# Patient Record
Sex: Male | Born: 1945 | Race: Black or African American | Hispanic: No | Marital: Married | Smoking: Former smoker
Health system: Southern US, Community
[De-identification: ages and names within clinical notes are randomized; demographics above are authoritative.]

## PROBLEM LIST (undated history)

## (undated) DIAGNOSIS — D649 Anemia, unspecified: Secondary | ICD-10-CM

## (undated) DIAGNOSIS — C61 Malignant neoplasm of prostate: Secondary | ICD-10-CM

## (undated) DIAGNOSIS — Z800 Family history of malignant neoplasm of digestive organs: Secondary | ICD-10-CM

## (undated) DIAGNOSIS — R3915 Urgency of urination: Secondary | ICD-10-CM

## (undated) DIAGNOSIS — R351 Nocturia: Secondary | ICD-10-CM

## (undated) DIAGNOSIS — G250 Essential tremor: Secondary | ICD-10-CM

## (undated) DIAGNOSIS — Z720 Tobacco use: Secondary | ICD-10-CM

## (undated) DIAGNOSIS — Z973 Presence of spectacles and contact lenses: Secondary | ICD-10-CM

## (undated) DIAGNOSIS — I639 Cerebral infarction, unspecified: Secondary | ICD-10-CM

## (undated) DIAGNOSIS — K219 Gastro-esophageal reflux disease without esophagitis: Secondary | ICD-10-CM

## (undated) DIAGNOSIS — I3139 Other pericardial effusion (noninflammatory): Secondary | ICD-10-CM

## (undated) DIAGNOSIS — Z860100 Personal history of colon polyps, unspecified: Secondary | ICD-10-CM

## (undated) DIAGNOSIS — C7B8 Other secondary neuroendocrine tumors: Secondary | ICD-10-CM

## (undated) DIAGNOSIS — E7800 Pure hypercholesterolemia, unspecified: Secondary | ICD-10-CM

## (undated) DIAGNOSIS — Z803 Family history of malignant neoplasm of breast: Secondary | ICD-10-CM

## (undated) DIAGNOSIS — I10 Essential (primary) hypertension: Secondary | ICD-10-CM

## (undated) DIAGNOSIS — Z8042 Family history of malignant neoplasm of prostate: Secondary | ICD-10-CM

## (undated) DIAGNOSIS — N529 Male erectile dysfunction, unspecified: Secondary | ICD-10-CM

## (undated) DIAGNOSIS — I300 Acute nonspecific idiopathic pericarditis: Secondary | ICD-10-CM

## (undated) DIAGNOSIS — I319 Disease of pericardium, unspecified: Secondary | ICD-10-CM

## (undated) DIAGNOSIS — I313 Pericardial effusion (noninflammatory): Secondary | ICD-10-CM

## (undated) HISTORY — DX: Family history of malignant neoplasm of prostate: Z80.42

## (undated) HISTORY — DX: Other secondary neuroendocrine tumors: C7B.8

## (undated) HISTORY — DX: Anemia, unspecified: D64.9

## (undated) HISTORY — DX: Cerebral infarction, unspecified: I63.9

## (undated) HISTORY — DX: Tobacco use: Z72.0

## (undated) HISTORY — PX: PROSTATE BIOPSY: SHX241

## (undated) HISTORY — DX: Pericardial effusion (noninflammatory): I31.3

## (undated) HISTORY — DX: Family history of malignant neoplasm of breast: Z80.3

## (undated) HISTORY — DX: Other pericardial effusion (noninflammatory): I31.39

## (undated) HISTORY — PX: COLONOSCOPY: SHX174

## (undated) HISTORY — DX: Disease of pericardium, unspecified: I31.9

## (undated) HISTORY — DX: Family history of malignant neoplasm of digestive organs: Z80.0

---

## 1999-08-22 ENCOUNTER — Ambulatory Visit (HOSPITAL_COMMUNITY): Admission: RE | Admit: 1999-08-22 | Discharge: 1999-08-22 | Payer: Self-pay | Admitting: Internal Medicine

## 2000-06-22 ENCOUNTER — Encounter: Admission: RE | Admit: 2000-06-22 | Discharge: 2000-06-22 | Payer: Self-pay | Admitting: Internal Medicine

## 2000-06-22 ENCOUNTER — Encounter: Payer: Self-pay | Admitting: Internal Medicine

## 2005-05-28 ENCOUNTER — Encounter: Admission: RE | Admit: 2005-05-28 | Discharge: 2005-05-28 | Payer: Self-pay | Admitting: Internal Medicine

## 2005-05-28 IMAGING — CR DG CERVICAL SPINE COMPLETE 4+V
5 series · 5 of 5 positions shown · non-contrast
Comparison: None.

CLINICAL DATA: Neck and left arm pain for 3-4 months.  No history of injury.
 FIVE VIEW CERVICAL SPINE:

[w c-spine lat]
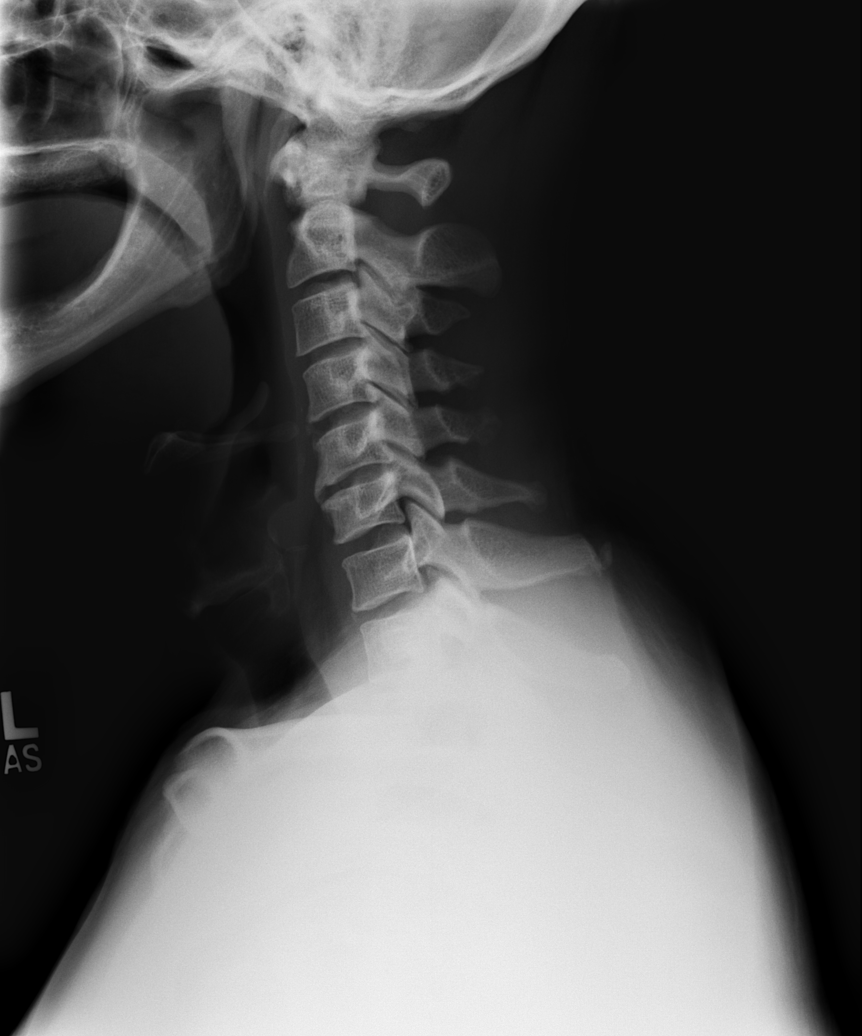

[w c-spine oblique (1 of 2)]
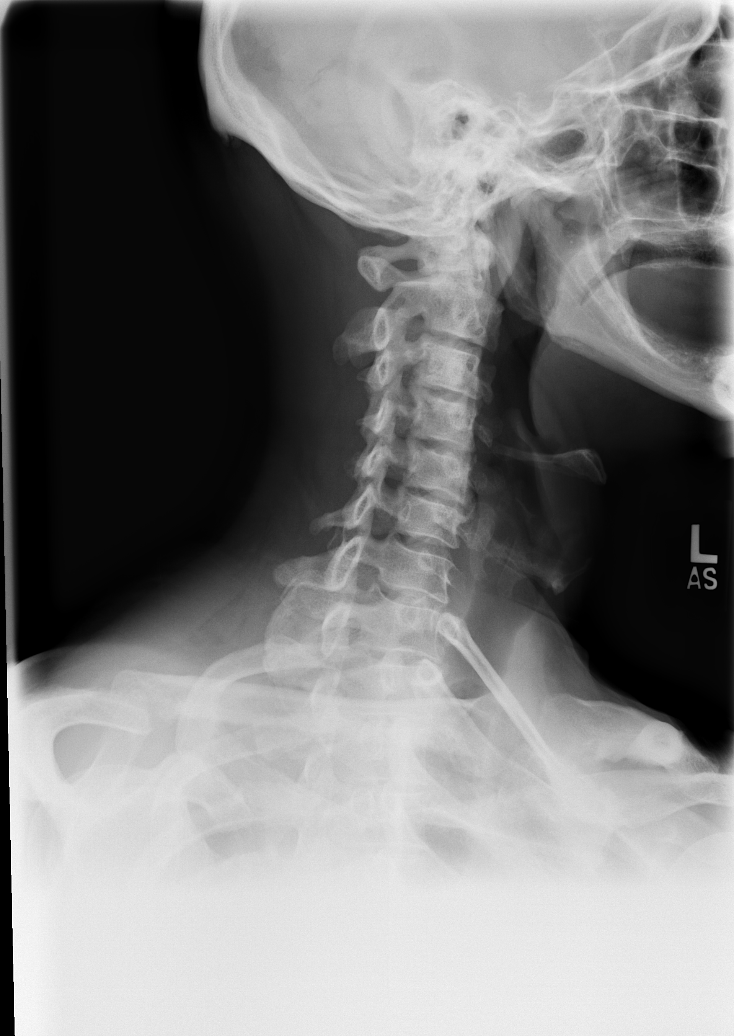

[w c-spine oblique (2 of 2)]
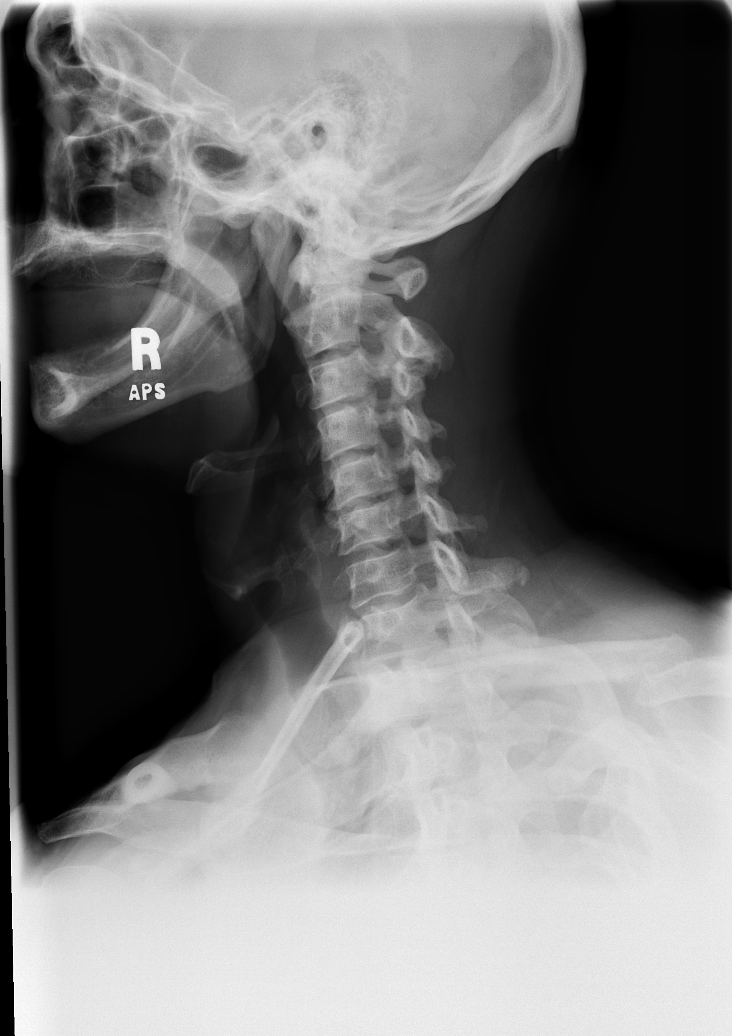

[w c-spine a.p. *]
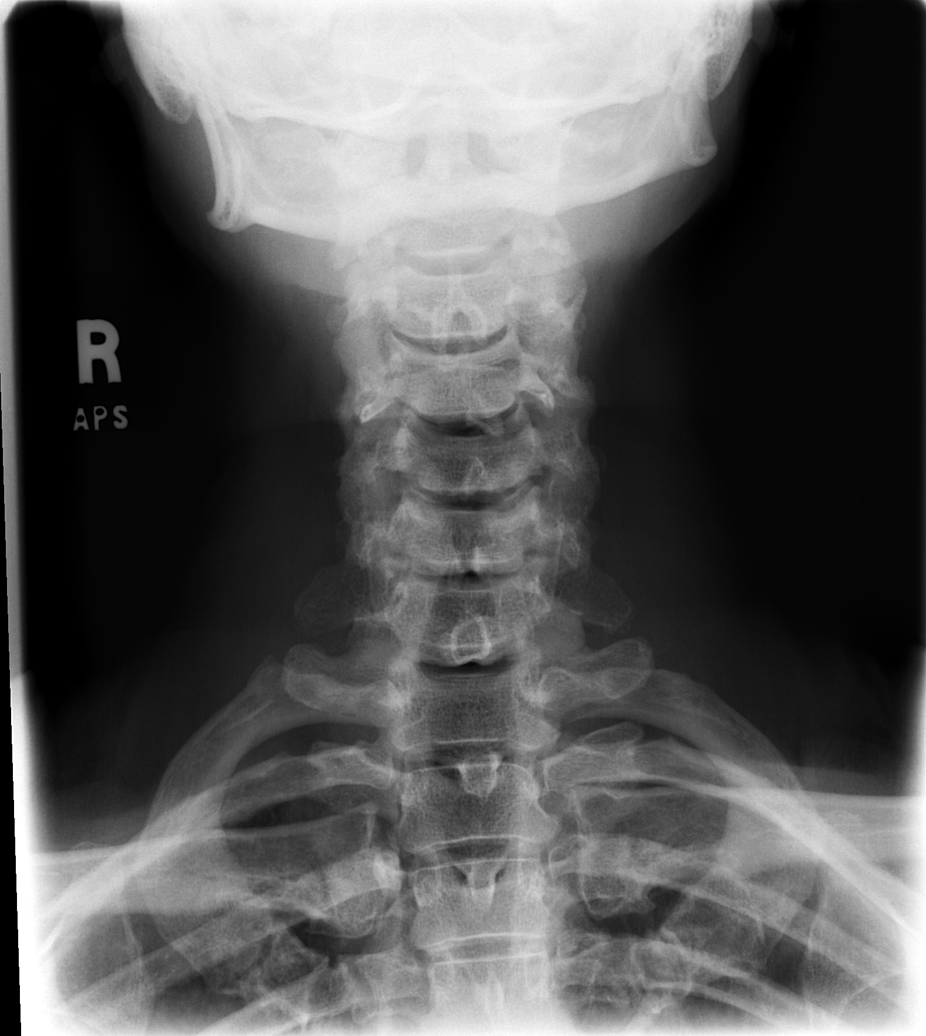

[w c-spine odontoid *]
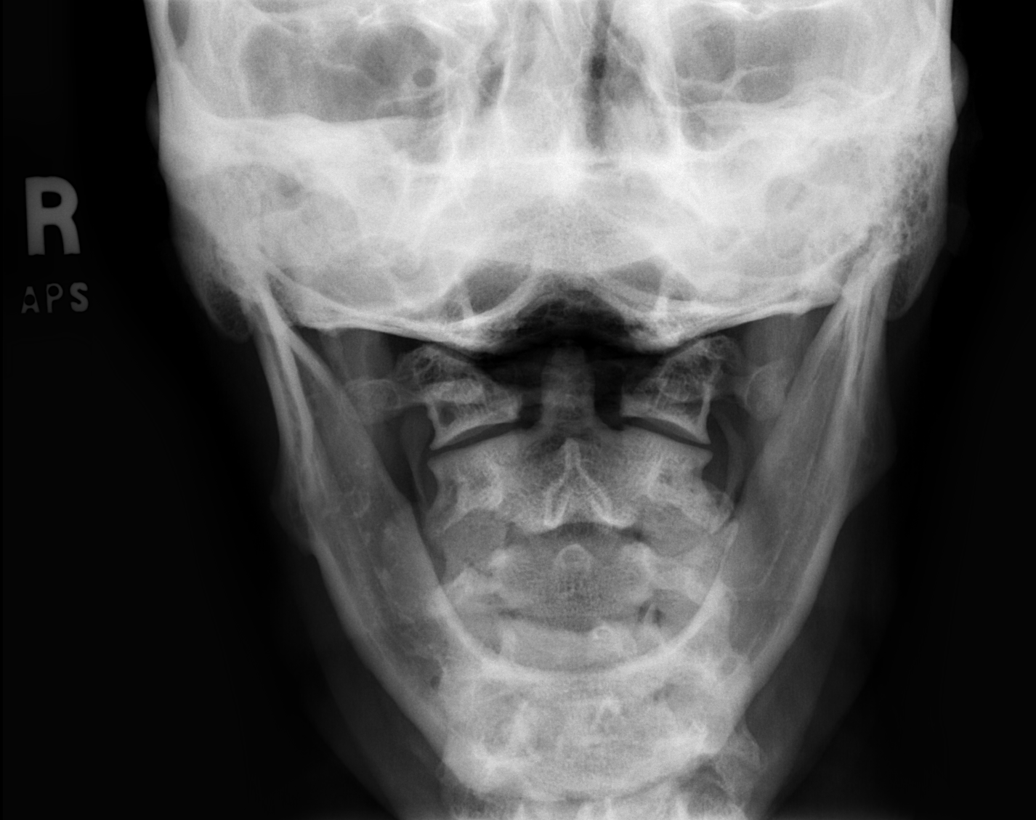

[5 of 5 positions shown; findings below may reference images not displayed]

No evidence for acute fracture or subluxation.  There is straightening of the normal cervical lordosis.  Mild loss of disk height is seen at C5-6 with associated end plate spurring.  Facets are well aligned bilaterally.  There is no evidence for prevertebral soft tissue swelling. 
 Oblique views show bony encroachment into the right C3-4 neural foramen secondary to uncinate spurring.  The left neural foramina appear patent.
IMPRESSION: 1.  No acute bony abnormality.
 2.  Uncinate spurring on the right at C3-4 generates bony encroachment of the exit foramen.  
 3.  Mild loss of disk height at C5-6 with anterior end plate spurring.

## 2014-09-05 DIAGNOSIS — J309 Allergic rhinitis, unspecified: Secondary | ICD-10-CM | POA: Diagnosis not present

## 2014-09-05 DIAGNOSIS — E780 Pure hypercholesterolemia: Secondary | ICD-10-CM | POA: Diagnosis not present

## 2014-09-05 DIAGNOSIS — I10 Essential (primary) hypertension: Secondary | ICD-10-CM | POA: Diagnosis not present

## 2014-09-05 DIAGNOSIS — K219 Gastro-esophageal reflux disease without esophagitis: Secondary | ICD-10-CM | POA: Diagnosis not present

## 2014-09-05 DIAGNOSIS — K5900 Constipation, unspecified: Secondary | ICD-10-CM | POA: Diagnosis not present

## 2015-01-05 DIAGNOSIS — M25562 Pain in left knee: Secondary | ICD-10-CM | POA: Diagnosis not present

## 2015-02-24 DIAGNOSIS — H524 Presbyopia: Secondary | ICD-10-CM | POA: Diagnosis not present

## 2015-02-24 DIAGNOSIS — H5210 Myopia, unspecified eye: Secondary | ICD-10-CM | POA: Diagnosis not present

## 2015-03-14 DIAGNOSIS — E780 Pure hypercholesterolemia: Secondary | ICD-10-CM | POA: Diagnosis not present

## 2015-03-14 DIAGNOSIS — K219 Gastro-esophageal reflux disease without esophagitis: Secondary | ICD-10-CM | POA: Diagnosis not present

## 2015-03-14 DIAGNOSIS — I10 Essential (primary) hypertension: Secondary | ICD-10-CM | POA: Diagnosis not present

## 2015-03-14 DIAGNOSIS — J309 Allergic rhinitis, unspecified: Secondary | ICD-10-CM | POA: Diagnosis not present

## 2015-03-14 DIAGNOSIS — H9201 Otalgia, right ear: Secondary | ICD-10-CM | POA: Diagnosis not present

## 2015-09-20 DIAGNOSIS — Z125 Encounter for screening for malignant neoplasm of prostate: Secondary | ICD-10-CM | POA: Diagnosis not present

## 2015-09-20 DIAGNOSIS — I10 Essential (primary) hypertension: Secondary | ICD-10-CM | POA: Diagnosis not present

## 2015-09-20 DIAGNOSIS — Z1211 Encounter for screening for malignant neoplasm of colon: Secondary | ICD-10-CM | POA: Diagnosis not present

## 2015-09-20 DIAGNOSIS — K5900 Constipation, unspecified: Secondary | ICD-10-CM | POA: Diagnosis not present

## 2015-09-20 DIAGNOSIS — E7800 Pure hypercholesterolemia, unspecified: Secondary | ICD-10-CM | POA: Diagnosis not present

## 2015-09-20 DIAGNOSIS — Z0000 Encounter for general adult medical examination without abnormal findings: Secondary | ICD-10-CM | POA: Diagnosis not present

## 2015-09-20 DIAGNOSIS — M542 Cervicalgia: Secondary | ICD-10-CM | POA: Diagnosis not present

## 2015-09-20 DIAGNOSIS — J309 Allergic rhinitis, unspecified: Secondary | ICD-10-CM | POA: Diagnosis not present

## 2016-02-26 DIAGNOSIS — M26621 Arthralgia of right temporomandibular joint: Secondary | ICD-10-CM | POA: Diagnosis not present

## 2016-02-26 DIAGNOSIS — G43009 Migraine without aura, not intractable, without status migrainosus: Secondary | ICD-10-CM | POA: Diagnosis not present

## 2016-02-26 DIAGNOSIS — H833X3 Noise effects on inner ear, bilateral: Secondary | ICD-10-CM | POA: Diagnosis not present

## 2016-02-26 DIAGNOSIS — H903 Sensorineural hearing loss, bilateral: Secondary | ICD-10-CM | POA: Diagnosis not present

## 2016-02-26 DIAGNOSIS — H748X1 Other specified disorders of right middle ear and mastoid: Secondary | ICD-10-CM | POA: Diagnosis not present

## 2016-03-24 DIAGNOSIS — E7800 Pure hypercholesterolemia, unspecified: Secondary | ICD-10-CM | POA: Diagnosis not present

## 2016-03-24 DIAGNOSIS — I10 Essential (primary) hypertension: Secondary | ICD-10-CM | POA: Diagnosis not present

## 2016-03-24 DIAGNOSIS — K219 Gastro-esophageal reflux disease without esophagitis: Secondary | ICD-10-CM | POA: Diagnosis not present

## 2016-03-24 DIAGNOSIS — R208 Other disturbances of skin sensation: Secondary | ICD-10-CM | POA: Diagnosis not present

## 2016-03-24 DIAGNOSIS — R972 Elevated prostate specific antigen [PSA]: Secondary | ICD-10-CM | POA: Diagnosis not present

## 2016-03-24 DIAGNOSIS — J309 Allergic rhinitis, unspecified: Secondary | ICD-10-CM | POA: Diagnosis not present

## 2016-05-16 DIAGNOSIS — R972 Elevated prostate specific antigen [PSA]: Secondary | ICD-10-CM | POA: Diagnosis not present

## 2016-05-16 DIAGNOSIS — R1033 Periumbilical pain: Secondary | ICD-10-CM | POA: Diagnosis not present

## 2016-06-24 DIAGNOSIS — R972 Elevated prostate specific antigen [PSA]: Secondary | ICD-10-CM | POA: Diagnosis not present

## 2016-08-14 DIAGNOSIS — C61 Malignant neoplasm of prostate: Secondary | ICD-10-CM | POA: Diagnosis not present

## 2016-08-14 DIAGNOSIS — R1033 Periumbilical pain: Secondary | ICD-10-CM | POA: Diagnosis not present

## 2016-08-26 DIAGNOSIS — H524 Presbyopia: Secondary | ICD-10-CM | POA: Diagnosis not present

## 2016-08-27 DIAGNOSIS — Z0100 Encounter for examination of eyes and vision without abnormal findings: Secondary | ICD-10-CM | POA: Diagnosis not present

## 2016-10-16 DIAGNOSIS — I10 Essential (primary) hypertension: Secondary | ICD-10-CM | POA: Diagnosis not present

## 2016-10-16 DIAGNOSIS — J309 Allergic rhinitis, unspecified: Secondary | ICD-10-CM | POA: Diagnosis not present

## 2016-10-16 DIAGNOSIS — K219 Gastro-esophageal reflux disease without esophagitis: Secondary | ICD-10-CM | POA: Diagnosis not present

## 2016-10-16 DIAGNOSIS — E7800 Pure hypercholesterolemia, unspecified: Secondary | ICD-10-CM | POA: Diagnosis not present

## 2016-10-16 DIAGNOSIS — Z0000 Encounter for general adult medical examination without abnormal findings: Secondary | ICD-10-CM | POA: Diagnosis not present

## 2016-10-16 DIAGNOSIS — Z1211 Encounter for screening for malignant neoplasm of colon: Secondary | ICD-10-CM | POA: Diagnosis not present

## 2016-10-16 DIAGNOSIS — K5900 Constipation, unspecified: Secondary | ICD-10-CM | POA: Diagnosis not present

## 2016-11-13 DIAGNOSIS — Z1211 Encounter for screening for malignant neoplasm of colon: Secondary | ICD-10-CM | POA: Diagnosis not present

## 2016-11-13 DIAGNOSIS — D126 Benign neoplasm of colon, unspecified: Secondary | ICD-10-CM | POA: Diagnosis not present

## 2016-11-18 DIAGNOSIS — D126 Benign neoplasm of colon, unspecified: Secondary | ICD-10-CM | POA: Diagnosis not present

## 2017-02-05 DIAGNOSIS — C61 Malignant neoplasm of prostate: Secondary | ICD-10-CM | POA: Diagnosis not present

## 2017-02-16 DIAGNOSIS — R1033 Periumbilical pain: Secondary | ICD-10-CM | POA: Diagnosis not present

## 2017-02-16 DIAGNOSIS — C61 Malignant neoplasm of prostate: Secondary | ICD-10-CM | POA: Diagnosis not present

## 2017-04-30 DIAGNOSIS — R208 Other disturbances of skin sensation: Secondary | ICD-10-CM | POA: Diagnosis not present

## 2017-04-30 DIAGNOSIS — E7800 Pure hypercholesterolemia, unspecified: Secondary | ICD-10-CM | POA: Diagnosis not present

## 2017-04-30 DIAGNOSIS — K219 Gastro-esophageal reflux disease without esophagitis: Secondary | ICD-10-CM | POA: Diagnosis not present

## 2017-04-30 DIAGNOSIS — J309 Allergic rhinitis, unspecified: Secondary | ICD-10-CM | POA: Diagnosis not present

## 2017-04-30 DIAGNOSIS — I10 Essential (primary) hypertension: Secondary | ICD-10-CM | POA: Diagnosis not present

## 2017-04-30 DIAGNOSIS — G250 Essential tremor: Secondary | ICD-10-CM | POA: Diagnosis not present

## 2017-06-11 DIAGNOSIS — Z0100 Encounter for examination of eyes and vision without abnormal findings: Secondary | ICD-10-CM | POA: Diagnosis not present

## 2017-06-11 DIAGNOSIS — H524 Presbyopia: Secondary | ICD-10-CM | POA: Diagnosis not present

## 2017-08-19 ENCOUNTER — Encounter: Payer: Self-pay | Admitting: Radiation Oncology

## 2017-08-27 ENCOUNTER — Encounter: Payer: Self-pay | Admitting: Radiation Oncology

## 2017-08-27 NOTE — Progress Notes (Addendum)
GU Location of Tumor / Histology: prostatic adenocarcinoma  If Prostate Cancer, Gleason Score is (3 + 4) and PSA is (6.75) on 02/05/17. Prostate volume: 45 gram smooth per Dr. Zettie Pho note. 32 gram per Dr. Rebekah Chesterfield note.   Ryan Wilkerson reports his PSA has been elevated since 2014. He reports it was 3 something in 2014 then, increased to 6 by 2016. Reports his PCP, Dr. Donnie Coffin routinely checked his PSA.  Biopsies of prostate (if applicable) revealed:    Past/Anticipated interventions by urology, if any: prostate biopsy, active surveillance, prostate biopsy, consult with Dr. Tresa Moore on 02/06/17, consult with Dr. Amalia Hailey, referred by Dr. Amalia Hailey to Dr. Tammi Klippel to discuss radiation therapy because he doesn't believe the patient is a good surgical candidate due to his age.   Past/Anticipated interventions by medical oncology, if any: no  Weight changes, if any: no  Bowel/Bladder complaints, if any: IPSS 12. Reports ED, leakage of urine/post void dribble, and intermittent stream. Denies dysuria, hematuria or incontinence.   Nausea/Vomiting, if any: no  Pain issues, if any:  no  SAFETY ISSUES:  Prior radiation? no  Pacemaker/ICD? no  Possible current pregnancy? no  Is the patient on methotrexate? no  Current Complaints / other details:  71 year old male. Married. 4 daughters. Many family members with prostate cancer. One brother died one month ago with prostate cancer. One brother had lung cancer. One sister with breast cancer. Denies that anyone in his family has a hx of colon ca. 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 Reports SOB with exertion.

## 2017-08-31 ENCOUNTER — Ambulatory Visit
Admission: RE | Admit: 2017-08-31 | Discharge: 2017-08-31 | Disposition: A | Discharge status: Home / Self Care | Payer: Non-veteran care | Source: Ambulatory Visit | Attending: Radiation Oncology | Admitting: Radiation Oncology

## 2017-08-31 ENCOUNTER — Encounter: Payer: Self-pay | Admitting: Radiation Oncology

## 2017-08-31 ENCOUNTER — Other Ambulatory Visit: Payer: Self-pay | Admitting: Radiation Oncology

## 2017-08-31 ENCOUNTER — Telehealth: Payer: Self-pay | Admitting: Genetics

## 2017-08-31 ENCOUNTER — Other Ambulatory Visit: Payer: Self-pay

## 2017-08-31 ENCOUNTER — Telehealth: Payer: Self-pay | Admitting: Radiation Oncology

## 2017-08-31 VITALS — BP 127/55 | HR 66 | Resp 16 | Ht 68.0 in | Wt 167.4 lb

## 2017-08-31 DIAGNOSIS — C61 Malignant neoplasm of prostate: Secondary | ICD-10-CM | POA: Diagnosis present

## 2017-08-31 DIAGNOSIS — K219 Gastro-esophageal reflux disease without esophagitis: Secondary | ICD-10-CM | POA: Insufficient documentation

## 2017-08-31 DIAGNOSIS — E7800 Pure hypercholesterolemia, unspecified: Secondary | ICD-10-CM | POA: Diagnosis not present

## 2017-08-31 DIAGNOSIS — Z79899 Other long term (current) drug therapy: Secondary | ICD-10-CM | POA: Insufficient documentation

## 2017-08-31 DIAGNOSIS — Z8042 Family history of malignant neoplasm of prostate: Secondary | ICD-10-CM | POA: Diagnosis not present

## 2017-08-31 DIAGNOSIS — Z809 Family history of malignant neoplasm, unspecified: Secondary | ICD-10-CM

## 2017-08-31 DIAGNOSIS — G250 Essential tremor: Secondary | ICD-10-CM | POA: Diagnosis not present

## 2017-08-31 DIAGNOSIS — I10 Essential (primary) hypertension: Secondary | ICD-10-CM | POA: Insufficient documentation

## 2017-08-31 DIAGNOSIS — Z87891 Personal history of nicotine dependence: Secondary | ICD-10-CM | POA: Insufficient documentation

## 2017-08-31 HISTORY — DX: Gastro-esophageal reflux disease without esophagitis: K21.9

## 2017-08-31 HISTORY — DX: Malignant neoplasm of prostate: C61

## 2017-08-31 HISTORY — DX: Male erectile dysfunction, unspecified: N52.9

## 2017-08-31 HISTORY — DX: Essential tremor: G25.0

## 2017-08-31 HISTORY — DX: Pure hypercholesterolemia, unspecified: E78.00

## 2017-08-31 HISTORY — DX: Essential (primary) hypertension: I10

## 2017-08-31 NOTE — Progress Notes (Signed)
Radiation Oncology         [phone] ________________________________  Initial outpatient Consultation  Name: Ryan Wilkerson MRN: [id number]  Date: 08/31/2017  DOB: 02-Dec-1945  [id number], L.Marlou Sa, MD  Butch Penny, MD   REFERRING PHYSICIAN: Butch Penny, MD  DIAGNOSIS: 71 year-old gentleman with Stage T1c adenocarcinoma of the prostate with Gleason Score of 3+4, and PSA of 6.75.   The primary encounter diagnosis was Malignant neoplasm of prostate ([place]). A diagnosis of Family history of cancer was also pertinent to this visit.    ICD-10-CM   1. Malignant neoplasm of prostate [facility]) [place] Ambulatory Referral to Genetics    Ambulatory referral to Social Work  2. Family history of cancer Z80.9 Ambulatory Referral to Genetics    HISTORY OF PRESENT ILLNESS: Ryan Wilkerson is a 71 y.o. male with a diagnosis of prostate cancer. He was noted to have an elevated PSA of 6.75 by his primary care physician, Dr. Donnie Coffin. The patient met with urologist Dr. Amalia Hailey at the [facility]. He was also referred for evaluation in urology by Dr. Tresa Moore.  Digital rectal examination was performed at that time revealing no nodularity. Recent digital rectal exam on 02/16/2017 revealed no nodularity.  The patient proceeded to transrectal ultrasound with 12 biopsies of the prostate on 06/24/2016.  The prostate volume measured 45 cc. Out of 12 core biopsies, 3 were positive.  The maximum Gleason score was 3+4, and this was seen in left base lateral, left base, and right base. The patient is scheduled for repeat prostate biopsy on 09/15/2016.   The patient reviewed the biopsy results with his urologist and he has kindly been referred today for discussion of potential radiation treatment options.      PREVIOUS RADIATION THERAPY: No  PAST MEDICAL HISTORY:  Past Medical History:  Diagnosis Date  . Adjustment disorder   . Erectile dysfunction   . Essential tremor   . GERD (gastroesophageal reflux disease)   .  Hypercholesterolemia   . Hypertension   . Prostate cancer ([place])       PAST SURGICAL HISTORY: Past Surgical History:  Procedure Laterality Date  . PROSTATE BIOPSY      FAMILY HISTORY:  Family History  Problem Relation Age of Onset  . Cancer Brother        prostate  . Cancer Brother        lung  . Cancer Sister        breast    SOCIAL HISTORY:  Social History   Socioeconomic History  . Marital status: Married    Spouse name: Not on file  . Number of children: 4  . Years of education: Not on file  . Highest education level: Not on file  Social Needs  . Financial resource strain: Not on file  . Food insecurity - worry: Not on file  . Food insecurity - inability: Not on file  . Transportation needs - medical: Not on file  . Transportation needs - non-medical: Not on file  Occupational History  . Not on file  Tobacco Use  . Smoking status: Former Smoker    Packs/day: 0.50    Years: 14.00    Pack years: 7.00    Types: Cigarettes    Last attempt to quit: 05/02/1974    Years since quitting: 43.3  . Smokeless tobacco: Never Used  Substance and Sexual Activity  . Alcohol use: No    Frequency: Never  . Drug use: No  . Sexual activity: Yes  Other Topics Concern  . Not on file  Social History Narrative   Married with 4 daughters. 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    ALLERGIES: Aspirin and Simvastatin  MEDICATIONS:  Current Outpatient Medications  Medication Sig Dispense Refill  . lisinopril-hydrochlorothiazide (PRINZIDE,ZESTORETIC) 20-12.5 MG tablet Take 1 tablet by mouth daily.    Marland Kitchen loratadine (CLARITIN) 10 MG tablet Take 10 mg by mouth daily.    . pravastatin (PRAVACHOL) 40 MG tablet Take 40 mg by mouth daily.     No current facility-administered medications for this encounter.     REVIEW OF SYSTEMS:  On review of systems, the patient reports that he is doing well  overall. He denies any chest pain, cough, fevers, chills, night sweats, unintended weight changes. He reports shortness of breath with exertion. He denies any bowel disturbances, and denies abdominal pain, nausea or vomiting. He denies any new musculoskeletal or joint aches or pains. His IPSS was 12, indicating moderate urinary symptoms. He reports ED, leakage of urine/post-void dribble, and intermittent stream. He denies dysuria, hematuria, or incontinence. He is able to complete sexual activity with less than half of attempts. A complete review of systems is obtained and is otherwise negative.    PHYSICAL EXAM:  Wt Readings from Last 3 Encounters:  08/31/17 167 lb 6.4 oz (75.9 kg)   Temp Readings from Last 3 Encounters:  No data found for Temp   BP Readings from Last 3 Encounters:  08/31/17 (!) 127/55   Pulse Readings from Last 3 Encounters:  08/31/17 66   Pain Assessment Pain Score: 0-No pain/10  In general this is a well appearing african-american male in no acute distress. He is alert and oriented x4 and appropriate throughout the examination. HEENT reveals that the patient is normocephalic, atraumatic. EOMs are intact. PERRLA. Skin is intact without any evidence of gross lesions. Cardiovascular exam reveals a regular rate and rhythm, no clicks rubs or murmurs are auscultated. Chest is clear to auscultation bilaterally. Lymphatic assessment is performed and does not reveal any adenopathy in the cervical, supraclavicular, axillary, or inguinal chains. Abdomen has active bowel sounds in all quadrants and is intact. The abdomen is soft, non tender, non distended. Lower extremities are negative for pretibial pitting edema, deep calf tenderness, cyanosis or clubbing.   KPS = 100  100 - Normal; no complaints; no evidence of disease. 90   - Able to carry on normal activity; minor signs or symptoms of disease. 80   - Normal activity with effort; some signs or symptoms of disease. 15   - Cares  for self; unable to carry on normal activity or to do active work. 60   - Requires occasional assistance, but is able to care for most of his personal needs. 50   - Requires considerable assistance and frequent medical care. 74   - Disabled; requires special care and assistance. 13   - Severely disabled; hospital admission is indicated although death not imminent. 36   - Very sick; hospital admission necessary; active supportive treatment necessary. 10   - Moribund; fatal processes progressing rapidly. 0     - Dead  Karnofsky DA, Abelmann WH, Craver LS and Burchenal JH [phone]) The use of the nitrogen mustards in the palliative treatment of carcinoma: with particular reference to bronchogenic carcinoma Cancer 1 634-56  LABORATORY DATA:  No results found for: WBC, HGB, HCT, MCV, PLT No results found  for: NA, K, CL, CO2 No results found for: ALT, AST, GGT, ALKPHOS, BILITOT   RADIOGRAPHY: No results found.    IMPRESSION/PLAN: 1. 71 y.o. gentleman with Stage T1c adenocarcinoma of the prostate with Gleason Score of 3+4, and PSA of 6.75. The patient will be undergoing repeat biopsy to confirm no additional transformation of his malignancy. Today we reviewed the findings and workup thus far.  We discussed the natural history of prostate cancer.  We reviewed the the implications of T-stage, Gleason's Score, and PSA on decision-making and outcomes in prostate cancer.  We discussed radiation treatment in the management of prostate cancer with regard to the logistics and delivery of external beam radiation treatment as well as the logistics and delivery of prostate brachytherapy.  We compared and contrasted each of these approaches and also compared these against prostatectomy. The patient would like to proceed with prostate IMRT.  We will share our findings with Dr. Tresa Moore and move forward with scheduling placement of fiducial markers and SpaceOAR in the OR at the time of his biopsy. The patient is counseled  that if he has high risk disease, he would need ADT and this would postpone treatment 2 months while ADT was taking effect. 2. Possible genetic predisposition to prostate cancer. The patient has an extensive family history of prostate cancer. We discussed referral to genetics for evaluation. The patient is interested in meeting with genetics.      Carola Rhine, PAC    Tyler Pita, MD  [facility] Direct Dial: [phone]  Fax: [phone] [email or web address]  Skype  [email or web address]  This document serves as a record of services personally performed by Tyler Pita, MD and Shona Simpson, PA-C. It was created on their behalf by Arlyce Harman, a trained medical scribe. The creation of this record is based on the scribe's personal observations and the provider's statements to them. This document has been checked and approved by the attending provider.

## 2017-08-31 NOTE — Progress Notes (Signed)
See progress note under physician encounter. 

## 2017-08-31 NOTE — Telephone Encounter (Signed)
08/31/17 called and spoke with patient to confirm appointment with Ferol Luz for genetic counseling on 10/20/17 @ 1:00pm.  Patient is aware to arrive 30 minutes prior to register.

## 2017-08-31 NOTE — Telephone Encounter (Signed)
Error

## 2017-09-02 ENCOUNTER — Encounter: Payer: Self-pay | Admitting: General Practice

## 2017-09-02 NOTE — Progress Notes (Signed)
[place] Psychosocial Distress Screening Clinical Social Work  Clinical Social Work was referred by distress screening protocol.  The patient scored a 6 on the Psychosocial Distress Thermometer which indicates moderate distress. Clinical Social Worker Edwyna Shell to assess for distress and other psychosocial needs. CSW and patient discussed common feeling and emotions when being diagnosed with cancer, and the importance of support during treatment. CSW informed patient of the support team and support services at [facility]. CSW provided contact information and encouraged patient to call with any questions or concerns.  "I'm a wreck, I was under the impression that I was in perfect health."  "Its all been going downhill" since Oct 2017 when I had a biopsy.  Lives w spouse "who has more problems than I have, that's what makes it so difficult."  Patient feels wife "depends on me an awful lot."  No transportation issues "as long I can drive myself to appointments."  Provided mail packet of information on [facility] [place] [place] for community case management and resources.  Per patient, he is well connected at [facility] w a PCP as well as oncology resources.  Encouraged patient to access any available resources through the [place] by consulting w his PCP.  Per patient, his primary concern relates to him being the primary caregiver for his wife who requires help w ADLs, transportation to appointments and similar needs - worries that he will be unable to fulfill these tasks if his health further declines.  Does report some support from his daughters who "check on [place] every now and then."  Patient states he will contact CSW w any needs concerns after beginning radiation treatments, pt aware that his treatment plan is being developed - needs may arise after he learns details of treatment plan.    ONCBCN DISTRESS SCREENING 08/31/2017  Screening Type Initial Screening  Distress experienced in past week  (1-10) 6  Family Problem type Partner  Emotional problem type Nervousness/Anxiety;Adjusting to illness  Spiritual/Religous concerns type Facing my mortality  Information Concerns Type Lack of info about treatment;Lack of info about complementary therapy choices  Physical Problem type Sleep/insomnia;Skin dry/itchy    Clinical Social Worker follow up needed: No.  If yes, follow up plan:  Pt to contact CSW as needed re needs, encouraged to access [place], [place] Worker Phone:  [phone]

## 2017-09-08 ENCOUNTER — Encounter: Payer: Self-pay | Admitting: *Deleted

## 2017-09-08 ENCOUNTER — Encounter: Payer: Self-pay | Admitting: Medical Oncology

## 2017-09-08 DIAGNOSIS — C61 Malignant neoplasm of prostate: Secondary | ICD-10-CM | POA: Diagnosis not present

## 2017-09-08 NOTE — Progress Notes (Signed)
On 09-08-16 [facility], [facility] was consult note

## 2017-09-08 NOTE — Progress Notes (Signed)
I called [name] to introduce myself as the prostate nurse navigator and my role. I was unable to meet him the day he consulted with Dr.Manning. He was diagnosed with prostate cancer a year ago. He is being seen at the [facility] and by Dr. Tresa Moore at [facility]. He is scheduled for a repeat biopsy and lab on 09/15/17 with Dr. Tresa Moore for restaging.  I will continue to follow and asked him to call me with questions and or concerns. He voiced understanding.

## 2017-09-15 DIAGNOSIS — C61 Malignant neoplasm of prostate: Secondary | ICD-10-CM | POA: Diagnosis not present

## 2017-09-15 DIAGNOSIS — R972 Elevated prostate specific antigen [PSA]: Secondary | ICD-10-CM | POA: Diagnosis not present

## 2017-09-21 DIAGNOSIS — C61 Malignant neoplasm of prostate: Secondary | ICD-10-CM | POA: Diagnosis not present

## 2017-09-21 DIAGNOSIS — R310 Gross hematuria: Secondary | ICD-10-CM | POA: Diagnosis not present

## 2017-10-02 ENCOUNTER — Encounter: Payer: Self-pay | Admitting: *Deleted

## 2017-10-02 NOTE — Progress Notes (Signed)
1610 Returned call he wanted to speak with Shona Simpson, PA-C about when his radiation will start and if she has seen his last biopsy.  I mentioned that Bryson Ha was not here today and I could have her call him on Monday.  He was in agreement with this plan and wants a rush on getting started with his radiation.

## 2017-10-05 ENCOUNTER — Telehealth: Payer: Self-pay | Admitting: Radiation Oncology

## 2017-10-05 NOTE — Telephone Encounter (Signed)
The patient's prostate biopsy results were reviewed and he will meet back next week with Dr. Tresa Moore. He needs to go to the OR for fiducial markers and spaceOAR next, followed by simulation. Enid Derry is aware of his case and will follow up to schedule these procedures for the patient.

## 2017-10-19 ENCOUNTER — Telehealth: Payer: Self-pay | Admitting: Genetics

## 2017-10-19 DIAGNOSIS — C61 Malignant neoplasm of prostate: Secondary | ICD-10-CM | POA: Diagnosis not present

## 2017-10-19 NOTE — Telephone Encounter (Signed)
Called patient to reschedule appointment- rescheduled to 2pm on Oct 20, 2017.

## 2017-10-20 ENCOUNTER — Inpatient Hospital Stay: Payer: No Typology Code available for payment source | Attending: Genetic Counselor | Admitting: Genetics

## 2017-10-20 ENCOUNTER — Inpatient Hospital Stay: Payer: No Typology Code available for payment source

## 2017-10-20 ENCOUNTER — Encounter: Payer: No Typology Code available for payment source | Admitting: Genetics

## 2017-10-20 ENCOUNTER — Other Ambulatory Visit: Payer: No Typology Code available for payment source

## 2017-10-20 DIAGNOSIS — Z8042 Family history of malignant neoplasm of prostate: Secondary | ICD-10-CM | POA: Diagnosis not present

## 2017-10-20 DIAGNOSIS — I10 Essential (primary) hypertension: Secondary | ICD-10-CM | POA: Diagnosis not present

## 2017-10-20 DIAGNOSIS — Z1379 Encounter for other screening for genetic and chromosomal anomalies: Secondary | ICD-10-CM | POA: Diagnosis not present

## 2017-10-20 DIAGNOSIS — J309 Allergic rhinitis, unspecified: Secondary | ICD-10-CM | POA: Diagnosis not present

## 2017-10-20 DIAGNOSIS — C61 Malignant neoplasm of prostate: Secondary | ICD-10-CM

## 2017-10-20 DIAGNOSIS — Z1159 Encounter for screening for other viral diseases: Secondary | ICD-10-CM | POA: Diagnosis not present

## 2017-10-20 DIAGNOSIS — Z808 Family history of malignant neoplasm of other organs or systems: Secondary | ICD-10-CM | POA: Diagnosis not present

## 2017-10-20 DIAGNOSIS — R42 Dizziness and giddiness: Secondary | ICD-10-CM | POA: Diagnosis not present

## 2017-10-20 DIAGNOSIS — E7800 Pure hypercholesterolemia, unspecified: Secondary | ICD-10-CM | POA: Diagnosis not present

## 2017-10-20 DIAGNOSIS — Z803 Family history of malignant neoplasm of breast: Secondary | ICD-10-CM | POA: Diagnosis not present

## 2017-10-20 DIAGNOSIS — Z800 Family history of malignant neoplasm of digestive organs: Secondary | ICD-10-CM | POA: Diagnosis not present

## 2017-10-20 DIAGNOSIS — G250 Essential tremor: Secondary | ICD-10-CM | POA: Diagnosis not present

## 2017-10-20 DIAGNOSIS — K219 Gastro-esophageal reflux disease without esophagitis: Secondary | ICD-10-CM | POA: Diagnosis not present

## 2017-10-20 DIAGNOSIS — H9190 Unspecified hearing loss, unspecified ear: Secondary | ICD-10-CM | POA: Diagnosis not present

## 2017-10-20 DIAGNOSIS — Z0000 Encounter for general adult medical examination without abnormal findings: Secondary | ICD-10-CM | POA: Diagnosis not present

## 2017-10-21 ENCOUNTER — Encounter: Payer: Self-pay | Admitting: Genetics

## 2017-10-21 DIAGNOSIS — Z800 Family history of malignant neoplasm of digestive organs: Secondary | ICD-10-CM | POA: Insufficient documentation

## 2017-10-21 DIAGNOSIS — Z803 Family history of malignant neoplasm of breast: Secondary | ICD-10-CM | POA: Insufficient documentation

## 2017-10-21 DIAGNOSIS — Z8042 Family history of malignant neoplasm of prostate: Secondary | ICD-10-CM | POA: Insufficient documentation

## 2017-10-21 NOTE — Progress Notes (Addendum)
REFERRING PROVIDER: Hayden Pedro, PA-C [place], [place] [address]  PRIMARY PROVIDER:  Alroy Dust, Carlean Jews.Marlou Sa, MD  PRIMARY REASON FOR VISIT:  1. Malignant neoplasm of prostate ([place])   2. Family history of prostate cancer   3. Family history of breast cancer   4. Family history of stomach cancer      HISTORY OF PRESENT ILLNESS:   Ryan Wilkerson, a 72 y.o. male, was seen for a [facility] cancer genetics consultation at the request of Ryan Wilkerson due to a personal and family history of prostate cancer.  [name] presents to clinic today to discuss the possibility of a hereditary predisposition to cancer, genetic testing, and to further clarify his future cancer risks, as well as potential cancer risks for family members.   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  He is discussing radiation treatment options at this time.   CANCER HISTORY:   No history exists.   Colonoscopy: yes; 2017-reportedly 1 polyp removed.  Told to come back in 5 years. . Had previously gone every 10 years since turning 75.  Mammogram within the last year: [facility] mammogram in 2001 due to pain in breast- reportedly normal. Reports he was exposed to agent orange in the [employer].    Past Medical History:  Diagnosis Date  . Adjustment disorder   . Erectile dysfunction   . Essential tremor   . Family history of breast cancer   . Family history of prostate cancer   . Family history of stomach cancer   . GERD (gastroesophageal reflux disease)   . Hypercholesterolemia   . Hypertension   . Prostate cancer [facility])     Past Surgical History:  Procedure Laterality Date  . PROSTATE BIOPSY      Social History   Socioeconomic History  . Marital status: Married    Spouse name: None  . Number of children: 4  . Years of education: None  . Highest education level: None  Social Needs  . Financial resource strain: None  . Food insecurity -  worry: None  . Food insecurity - inability: None  . Transportation needs - medical: None  . Transportation needs - non-medical: None  Occupational History  . None  Tobacco Use  . Smoking status: Former Smoker    Packs/day: 0.50    Years: 14.00    Pack years: 7.00    Types: Cigarettes    Last attempt to quit: 05/02/1974    Years since quitting: 43.5  . Smokeless tobacco: Never Used  Substance and Sexual Activity  . Alcohol use: No    Frequency: Never  . Drug use: No  . Sexual activity: Yes  Other Topics Concern  . None  Social History Narrative   Married with 4 daughters. 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     FAMILY HISTORY:  We obtained a detailed, 4-generation family history.  Significant diagnoses are listed below:  Family History  Problem Relation Age of Onset  . Prostate cancer Brother 78  . Prostate cancer Brother        metastatic/ late treatment dx in 60's/70's  . Breast cancer Sister 75  . Prostate cancer Brother        dx in 60's/70's  . Prostate cancer Brother        dx 60's/70's  . Prostate cancer Brother  dx 60's/70's  . Prostate cancer Brother        71's  . Stomach cancer Brother   . Prostate cancer Other   . Prostate cancer Other   . Lung cancer Sister    [name] has 3 daughters and a son.  His youngest daughter is 73 and his oldest child is his son, 68.  One of his daughter died at 31 due to heart disease.  He has 7 grandchildren, the oldest is 70.    [name] has 7 brothers and 4 sisters listed below: -1 brother was diagnosed with prostate cancer in his 62's -31 brother was diagnosed with prostate cancer in his 6's/70's -1 brother was diagnosed with prostate cancer in his 65's/70's and it was untreated and metastasized.  He died as a result of this cancer.  -1 brother has no history of prostate cancer.  -1 brother was diagnosed with  prostate cancer in his 71's/70's. -1 brother diagnosed with prostate cancer in 60's/70's and also had stomach cancer.  He is deceased.  -1 brother was diagnosed with prostate cancer in his 92's/70's.  He has a son who has developed prostate cancer.  -1 sister diagnosed with breast cancer in her 82's, is now 48.   -2 sisters with no history of cancer.  One of these sisters had a son who has developed prostate cancer.  -1 sister was diagnosed with lung cancer.   [name]'s father died at 37 with no history of cancer.  [name] has limited knowledge about his paternal family's health history.  He has 13 paternal aunts/uncles and knows there is some cancer in them but does not know the type or any details.  He reports many of them had a history of smoking.  He has several paternal cousins, and is not aware of any history of cancer in any of them.  [name] does not know any information about his paternal grandparents.    [name] mother died at 88 with no history of cancer.  [name] has 5 maternal aunts/uncles (is unsure how many of each).  He is not aware of any cancer history in these aunts/uncles.  He has several maternal cousins, one has a diagnosis of cancer, but he is unsure what type this cousin has.  [name] reports his maternal grandparents had no history of cancer he is aware of.   [name] is unaware of previous family history of genetic testing for hereditary cancer risks. Patient's maternal ancestors are of African American/Native American descent, and paternal ancestors are of African American descent. There is no reported Ashkenazi Jewish ancestry. There is no known consanguinity.  GENETIC COUNSELING ASSESSMENT: CAIRO Ryan Wilkerson is a 72 y.o. male with a personal and family history which is somewhat suggestive of a Hereditary Cancer Predisposition Syndrome. We, therefore, discussed and recommended the following at today's visit.   DISCUSSION: We reviewed the  characteristics, features and inheritance patterns of hereditary cancer syndromes. We also discussed genetic testing, including the appropriate family members to test, the process of testing, insurance coverage and turn-around-time for results. We discussed the implications of a negative, positive and/or variant of uncertain significant result. We recommended Mr. Evetts pursue genetic testing for the Common Hereditary Cancer  gene panel + Prostate Cancer Panel. The Common Hereditary Cancer Panel + Prostate cancer panel offered by [facility] includes sequencing and/or deletion duplication testing of the following 48 genes: APC, ATM, AXIN2, BARD1, BMPR1A, BRCA1, BRCA2, BRIP1, CDH1, CDKN2A (p14ARF), CDKN2A (  p16INK4a), CKD4, CHEK2, CTNNA1, DICER1, EPCAM, FANCA, (Deletion/duplication testing only), GREM1 (promoter region deletion/duplication testing only), KIT, MEN1, MLH1, MSH2, MSH3, MSH6, MUTYH, NBN, NF1, NHTL1, PALB2, PDGFRA, PMS2, POLD1, POLE, PTEN, RAD50, RAD51C, RAD51D, SDHB, SDHC, SDHD, SMAD4, SMARCA4. STK11, TP53, TSC1, TSC2, and VHL.  The following genes were evaluated for sequence changes only: SDHA and HOXB13 c.251G>A variant only.  We discussed that only 5-10% of cancers are associated with a Hereditary cancer predisposition syndrome.  One of the most common hereditary cancer syndromes that increases prostate cancer risk is called Hereditary Breast and Ovarian Cancer (HBOC) syndrome.  This syndrome is caused by mutations in the BRCA1 and BRCA2 genes.  This syndrome increases an individual's lifetime risk to develop breast, ovarian, pancreatic, and other types of cancer.  There are also many other cancer predisposition syndromes caused by mutations in several other genes.  We also briefly discussed the gene HOXB13 and the increased risk for prostate cancer associated with specific mutations in this gene.   We discussed that if she is found to have a mutation in one of these genes, it may impact future medical  management recommendations such as increased cancer screenings and consideration of risk reducing surgeries.  A positive result could also have implications for the patient's family members.  A Negative result would mean we were unable to identify a hereditary component to his cancer, but does not rule out the possibility of a hereditary basis for his prostate cancer.  There could be mutations that are undetectable by current technology, or in genes not yet tested or identified to increase cancer risk.  There could also be a hereditary cause for the cancer in the family that he did not inherit but could be present in other relatives.   We discussed the potential to find a Variant of Uncertain Significance or VUS.  These are variants that have not yet been identified as pathogenic or benign, and it is unknown if this variant is associated with increased cancer risk or if this is a normal finding.  Most VUS's are reclassified to benign or likely benign.   It should not be used to make medical management decisions. With time, we suspect the lab will determine the significance of any VUS's identified if any.   Based on Mr. Tuohy's personal and family history of cancer, he meets medical criteria for genetic testing. Despite that he meets criteria, he may still have an out of pocket cost. We discussed that if his out of pocket cost for testing is over $100, the laboratory will call and confirm whether he wants to proceed with testing.  If the out of pocket cost of testing is less than $100 he will be billed by the genetic testing laboratory.   PLAN: After considering the risks, benefits, and limitations, Mr. Parkerson  provided informed consent to pursue genetic testing and the blood sample was sent to [facility] for analysis of the Common Hereditary Cancer Panel + Prostate cancer panel. Results should be available within approximately 2-3 weeks' time, at which point they will be disclosed by telephone to  Mr. Sibal, as will any additional recommendations warranted by these results. Mr. Higinbotham will receive a summary of his genetic counseling visit and a copy of his results once available. This information will also be available in Epic. We encouraged Mr. Poucher to remain in contact with cancer genetics annually so that we can continuously update the family history and inform him of any changes in cancer genetics and  testing that may be of benefit for his family. Mr. Pacitti questions were answered to his satisfaction today. Our contact information was provided should additional questions or concerns arise.  Based on Mr. Macbeth's family history, we recommended his relatives, especially those diagnosed with cancer, have genetic counseling and testing. Mr. Paff will let us know if we can be of any assistance in coordinating genetic counseling and/or testing for this family member.   Lastly, we encouraged Mr. Poirier to remain in contact with cancer genetics annually so that we can continuously update the family history and inform him of any changes in cancer genetics and testing that may be of benefit for this family.   Mr.  Rathert questions were answered to his satisfaction today. Our contact information was provided should additional questions or concerns arise. Thank you for the referral and allowing [place] to share in the care of your patient.   Tana Felts, MS Genetic Counselor [email or web address] phone: [phone]  The patient was seen for a total of 40 minutes in face-to-face genetic counseling.

## 2017-11-03 NOTE — Progress Notes (Signed)
  GU Location of Tumor / Histology: Malignant neoplasm of prostate    If Prostate Cancer, Gleason Score is (4+3= 7 ) and PSA is 09-08-17 (8.48) Prostate volume:   Ryan Wilkerson presented his PSA has been elevated since 2014. He reports it was 3 something in 2014 then, increased to 6 by 2016.  Reports his PCP, Dr. Donnie Coffin routinely checked his PSA.  Biopsies of  (if applicable) revealed: 71-24-58      Biopsies of prostate (if applicable) revealed: 09-98-33   Past/Anticipated interventions by urology, if any: prostate biopsy, active surveillance, prostate biopsy, consult with Dr. Tresa Moore on 02/06/17, consult with Dr. Amalia Hailey, referred by Dr. Amalia Hailey to Dr. Tammi Klippel to discuss radiation therapy    Past/Anticipated interventions by urology, if any:10-20-17 Hereditary cancer pedigree  prostate biopsy, active surveillance, prostate biopsy, consult with Dr. Tresa Moore on 02/06/17, consult with Dr. Amalia Hailey, referred by Dr. Amalia Hailey to Dr. Tammi Klippel to discuss radiation therapy because he doesn't believe the patient is a good surgical candidate due to his age.    Past/Anticipated interventions by medical oncology, if any: No  Weight changes, if any: Wt Readings from Last 3 Encounters:  11/04/17 164 lb (74.4 kg)  08/31/17 167 lb 6.4 oz (75.9 kg)     Bowel/Bladder complaints, if any: IPSS  20    Reports ED, leakage of urine/post void dribble,intermittent stream, weak stream and incomplete emptying. Denies dysuria, hematuria or incontinence.  Nausea/Vomiting, if any: No Pain issues, if any: No  SAFETY ISSUES:  Prior radiation? : No  Pacemaker/ICD? :No  Possible current pregnancy? : No  Is the patient on methotrexate? : No  Current Complaints / other details: Current Complaints / other details:  72 year old male. Married. 4 daughters. Many family members with prostate cancer. One brother died one month ago with prostate cancer. One brother had lung cancer. One sister with breast cancer.  Denies that anyone in his family has a hx of colon ca. 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 BP 129/68 (BP Location: Left Arm, Patient Position: Sitting, Cuff Size: Normal)   Pulse (!) 56   Temp 98.4 F (36.9 C) (Oral)   Resp 18   Ht 5\' 8"  (1.727 m)   Wt 164 lb (74.4 kg)   SpO2 100%   BMI 24.94 kg/m

## 2017-11-04 ENCOUNTER — Ambulatory Visit
Admission: RE | Admit: 2017-11-04 | Discharge: 2017-11-04 | Disposition: A | Discharge status: Home / Self Care | Payer: Non-veteran care | Source: Ambulatory Visit | Attending: Radiation Oncology | Admitting: Radiation Oncology

## 2017-11-04 ENCOUNTER — Ambulatory Visit
Admission: RE | Admit: 2017-11-04 | Discharge: 2017-11-04 | Disposition: A | Discharge status: Home / Self Care | Payer: Non-veteran care | Source: Ambulatory Visit | Attending: Urology | Admitting: Urology

## 2017-11-04 ENCOUNTER — Encounter: Payer: Self-pay | Admitting: Urology

## 2017-11-04 ENCOUNTER — Other Ambulatory Visit: Payer: Self-pay

## 2017-11-04 VITALS — BP 129/68 | HR 56 | Temp 98.4°F | Resp 18 | Ht 68.0 in | Wt 164.0 lb

## 2017-11-04 DIAGNOSIS — C61 Malignant neoplasm of prostate: Secondary | ICD-10-CM

## 2017-11-04 DIAGNOSIS — Z79899 Other long term (current) drug therapy: Secondary | ICD-10-CM | POA: Insufficient documentation

## 2017-11-04 DIAGNOSIS — G250 Essential tremor: Secondary | ICD-10-CM | POA: Insufficient documentation

## 2017-11-04 DIAGNOSIS — Z87891 Personal history of nicotine dependence: Secondary | ICD-10-CM | POA: Diagnosis not present

## 2017-11-04 DIAGNOSIS — I10 Essential (primary) hypertension: Secondary | ICD-10-CM | POA: Insufficient documentation

## 2017-11-04 DIAGNOSIS — Z886 Allergy status to analgesic agent status: Secondary | ICD-10-CM | POA: Diagnosis not present

## 2017-11-04 DIAGNOSIS — E7800 Pure hypercholesterolemia, unspecified: Secondary | ICD-10-CM | POA: Diagnosis not present

## 2017-11-04 DIAGNOSIS — Z8042 Family history of malignant neoplasm of prostate: Secondary | ICD-10-CM | POA: Insufficient documentation

## 2017-11-04 DIAGNOSIS — Z800 Family history of malignant neoplasm of digestive organs: Secondary | ICD-10-CM | POA: Insufficient documentation

## 2017-11-04 DIAGNOSIS — Z803 Family history of malignant neoplasm of breast: Secondary | ICD-10-CM | POA: Diagnosis not present

## 2017-11-04 DIAGNOSIS — Z801 Family history of malignant neoplasm of trachea, bronchus and lung: Secondary | ICD-10-CM | POA: Insufficient documentation

## 2017-11-04 DIAGNOSIS — K219 Gastro-esophageal reflux disease without esophagitis: Secondary | ICD-10-CM | POA: Diagnosis not present

## 2017-11-04 DIAGNOSIS — F4320 Adjustment disorder, unspecified: Secondary | ICD-10-CM | POA: Insufficient documentation

## 2017-11-04 DIAGNOSIS — N529 Male erectile dysfunction, unspecified: Secondary | ICD-10-CM | POA: Diagnosis not present

## 2017-11-04 NOTE — Progress Notes (Signed)
Radiation Oncology         [phone] ________________________________  Initial outpatient Consultation  Name: Ryan Wilkerson MRN: [id number]  Date: 11/04/2017  DOB: July 11, 1946  WH:[id number], L.Marlou Sa, MD  Alexis Frock, MD   REFERRING PHYSICIAN: Alexis Frock, MD  DIAGNOSIS: 71 year-old gentleman with Stage T1c adenocarcinoma of the prostate with Gleason Score of 4+3, and PSA of 8.48.    ICD-10-CM   1. Malignant neoplasm of prostate ([place]) C61     HISTORY OF PRESENT ILLNESS: Ryan Wilkerson is a 72 y.o. male with a diagnosis of prostate cancer. He was initially noted to have an elevated PSA of 6.75 by his primary care physician, Dr. Donnie Coffin. The patient met with urologist Dr. Amalia Hailey at the [facility]. He was also referred for evaluation in urology by Dr. Tresa Moore.  Digital rectal examination was performed at that time revealing no nodularity. Recent digital rectal exam on 02/16/2017 revealed no nodularity.  The patient proceeded to transrectal ultrasound with 12 biopsies of the prostate on 06/24/2016.  The prostate volume measured 45 cc. Out of 12 core biopsies, 3 were positive.  The maximum Gleason score was 3+4, and this was seen in left base lateral, left base, and right base.  We saw him for an initial consult to discuss treatment options on 08/31/2017.  At that time, the patient was leaning towards proceeding with 8 weeks of external beam radiotherapy but was planning a repeat biopsy with Dr. Tresa Moore in January 2019.  A repeat PSA on 09/08/17 noted a further rise in PSA at 8.48.   Therefore, the pateint proceeded with a repeat TRUSPBx on 09/15/2016.  The prostate volume measured 33 cc. Out of 12 core biopsies, 5 were positive.  The maximum Gleason score was 4+3, and this was seen in left base lateral and left base.  Additionally, there was Gleason 3+4 in the left mid lateral and 3+3 in the right mid and right apex lateral.    The patient met back with Dr. Tresa Moore and reviewed the most recent biopsy  results and he has kindly been referred today for further discussion of potential radiation treatment options.    PREVIOUS RADIATION THERAPY: No  PAST MEDICAL HISTORY:  Past Medical History:  Diagnosis Date  . Adjustment disorder   . Erectile dysfunction   . Essential tremor   . Family history of breast cancer   . Family history of prostate cancer   . Family history of stomach cancer   . GERD (gastroesophageal reflux disease)   . Hypercholesterolemia   . Hypertension   . Prostate cancer ([place])       PAST SURGICAL HISTORY: Past Surgical History:  Procedure Laterality Date  . PROSTATE BIOPSY      FAMILY HISTORY:  Family History  Problem Relation Age of Onset  . Prostate cancer Brother 22  . Prostate cancer Brother        metastatic/ late treatment dx in 60's/70's  . Breast cancer Sister 78  . Prostate cancer Brother        dx in 60's/70's  . Prostate cancer Brother        dx 60's/70's  . Prostate cancer Brother        dx 60's/70's  . Prostate cancer Brother        33's  . Stomach cancer Brother   . Prostate cancer Other   . Prostate cancer Other   . Lung cancer Sister     SOCIAL HISTORY:  Social History  Socioeconomic History  . Marital status: Married    Spouse name: Not on file  . Number of children: 4  . Years of education: Not on file  . Highest education level: Not on file  Social Needs  . Financial resource strain: Not on file  . Food insecurity - worry: Not on file  . Food insecurity - inability: Not on file  . Transportation needs - medical: Not on file  . Transportation needs - non-medical: Not on file  Occupational History  . Not on file  Tobacco Use  . Smoking status: Former Smoker    Packs/day: 0.50    Years: 14.00    Pack years: 7.00    Types: Cigarettes    Last attempt to quit: 05/02/1974    Years since quitting: 43.5  . Smokeless tobacco: Never Used  Substance and Sexual Activity  . Alcohol use: No    Frequency: Never  . Drug  use: No  . Sexual activity: Yes  Other Topics Concern  . Not on file  Social History Narrative   Married with 3 daughters, 1 son. 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    ALLERGIES: Aspirin and Simvastatin  MEDICATIONS:  Current Outpatient Medications  Medication Sig Dispense Refill  . lisinopril-hydrochlorothiazide (PRINZIDE,ZESTORETIC) 20-12.5 MG tablet Take 1 tablet by mouth daily.    Marland Kitchen loratadine (CLARITIN) 10 MG tablet Take 10 mg by mouth daily.    . pravastatin (PRAVACHOL) 40 MG tablet Take 40 mg by mouth daily.    . propranolol (INDERAL) 10 MG tablet Take 10 mg by mouth every morning.     No current facility-administered medications for this encounter.     REVIEW OF SYSTEMS:  On review of systems, the patient reports that he is doing well overall. He denies any chest pain, cough, fevers, chills, night sweats, unintended weight changes. He reports shortness of breath with exertion. He denies any bowel disturbances, and denies abdominal pain, nausea or vomiting. He denies any new musculoskeletal or joint aches or pains. His IPSS is currently 20 which is increased from his previous visit where IPSS was 12, indicating moderate urinary symptoms. He reports ED, leakage of urine/post-void dribble, weak stream, hesitancy and intermittent stream. He denies dysuria, gross hematuria, severe frequency or urgency, or incontinence. He is able to complete sexual activity with less than half of attempts. A complete review of systems is obtained and is otherwise negative.    PHYSICAL EXAM:  Wt Readings from Last 3 Encounters:  11/04/17 164 lb (74.4 kg)  08/31/17 167 lb 6.4 oz (75.9 kg)   Temp Readings from Last 3 Encounters:  11/04/17 98.4 F (36.9 C) (Oral)   BP Readings from Last 3 Encounters:  11/04/17 129/68  08/31/17 (!) 127/55   Pulse Readings from Last 3 Encounters:  11/04/17 (!) 56    08/31/17 66   Pain Assessment Pain Score: 0-No pain/10  In general this is a well appearing african-american male in no acute distress. He is alert and oriented x4 and appropriate throughout the examination. HEENT reveals that the patient is normocephalic, atraumatic. EOMs are intact. PERRLA. Skin is intact without any evidence of gross lesions. Cardiovascular exam reveals a regular rate and rhythm, no clicks rubs or murmurs are auscultated. Chest is clear to auscultation bilaterally. Lymphatic assessment is performed and does not reveal any adenopathy in the cervical, supraclavicular, axillary, or inguinal chains. Abdomen has active bowel  sounds in all quadrants and is intact. The abdomen is soft, non tender, non distended. Lower extremities are negative for pretibial pitting edema, deep calf tenderness, cyanosis or clubbing.   KPS = 100  100 - Normal; no complaints; no evidence of disease. 90   - Able to carry on normal activity; minor signs or symptoms of disease. 80   - Normal activity with effort; some signs or symptoms of disease. 28   - Cares for self; unable to carry on normal activity or to do active work. 60   - Requires occasional assistance, but is able to care for most of his personal needs. 50   - Requires considerable assistance and frequent medical care. 45   - Disabled; requires special care and assistance. 20   - Severely disabled; hospital admission is indicated although death not imminent. 76   - Very sick; hospital admission necessary; active supportive treatment necessary. 10   - Moribund; fatal processes progressing rapidly. 0     - Dead  Karnofsky DA, Abelmann WH, Craver LS and Burchenal JH [phone]) The use of the nitrogen mustards in the palliative treatment of carcinoma: with particular reference to bronchogenic carcinoma Cancer 1 634-56  LABORATORY DATA:  No results found for: WBC, HGB, HCT, MCV, PLT No results found for: NA, K, CL, CO2 No results found for: ALT,  AST, GGT, ALKPHOS, BILITOT   RADIOGRAPHY: No results found.    IMPRESSION/PLAN: 1. 72 y.o. gentleman with Stage T1c adenocarcinoma of the prostate with Gleason Score of 4+3, and PSA of 8.48. The patient has had an interval rise in his PSA as well as progression of volume and Gleason score related to his prostate cancer. Today we reviewed the findings and workup thus far.  We discussed the natural history of prostate cancer.  We reviewed the the implications of T-stage, Gleason's Score, and PSA on decision-making and outcomes in prostate cancer.  We discussed radiation treatment in the management of prostate cancer with regard to the logistics and delivery of external beam radiation treatment as well as the logistics and delivery of prostate brachytherapy.    We compared and contrasted each of these approaches and also compared these against prostatectomy. We did discuss the potential increased risk for post-op urinary retention given his baseline increased BOO despite the fact that his prostate does not appear to be significantly enlarged on [place].  We also discussed radiation safety, particularly as it pertains to young children and pregnant females.  He has grandchildren and great grandchildren that he spends time with regularly and does not want to limit the quality of time spent with them.  Therefore, the patient would like to proceed with hypo-fractionated prostate IMRT delivered over the course of 5 1/2 weeks of daily treatments.  We will share our findings with Dr. Tresa Moore and move forward with scheduling placement of fiducial markers and SpaceOAR in the outpatient surgical setting. We discussed the role of ADT in the treatment of prostate cancer as well as the side effects associated with this therapy.  Given his intermediate risk disease, this is not strongly recommended but does remain an option nonetheless.  The patient wishes to avoid ADT unless felt absolutely necessary. 2. Possible genetic  predisposition to prostate cancer. The patient has an extensive family history of prostate cancer. He has recently met with one of the genetic counselors at the [facility], submitted samples for screening and is awaiting test results.    Nicholos Johns, PA-C  Tyler Pita, MD  [facility]  Radiation Oncology Direct Dial: [phone]  Fax: [phone] [email or web address]  Skype  LinkedIn

## 2017-11-06 ENCOUNTER — Telehealth: Payer: Self-pay | Admitting: Genetics

## 2017-11-06 NOTE — Telephone Encounter (Signed)
Revealed negative genetic testing.  We discussed that we do not know why he has prostate cancer cancer or why there is cancer in the family. It could be due to a different gene that we are not testing, or maybe our current technology may not be able to pick something up.  It will be important for him to keep in contact with genetics to learn if additional testing may be needed in the future.  We discussed it is also possible there is a hereditary cause for the cancer in his family that he did not inherit and therefore was not identified in him.  We recommended his other relatives affected with caner also have genetic testing.  We recommended his children tell their doctors about the family history of cancer so they can provide the appropriate screening for them.

## 2017-11-12 ENCOUNTER — Telehealth: Payer: Self-pay | Admitting: *Deleted

## 2017-11-12 ENCOUNTER — Other Ambulatory Visit: Payer: Self-pay | Admitting: Urology

## 2017-11-12 DIAGNOSIS — C61 Malignant neoplasm of prostate: Secondary | ICD-10-CM

## 2017-11-12 NOTE — Telephone Encounter (Signed)
CALLED PATIENT TO INFORM OF FID. MARKER AND SPACE OAR PLACEMENT ON 12-23-17 @ 2 PM @ [facility] ON 12-25-17 @ 10 AM @ DR. MANNING'S OFFICE, SPOKE WITH PATIENT AND HE IS AWARE OF THESE APPTS.

## 2017-11-12 NOTE — Telephone Encounter (Signed)
xxxxx 

## 2017-11-13 ENCOUNTER — Encounter: Payer: Self-pay | Admitting: Genetics

## 2017-11-13 ENCOUNTER — Other Ambulatory Visit: Payer: Self-pay | Admitting: Urology

## 2017-11-13 ENCOUNTER — Ambulatory Visit: Payer: Self-pay | Admitting: Genetics

## 2017-11-13 DIAGNOSIS — Z1379 Encounter for other screening for genetic and chromosomal anomalies: Secondary | ICD-10-CM

## 2017-11-13 DIAGNOSIS — C61 Malignant neoplasm of prostate: Secondary | ICD-10-CM

## 2017-11-13 DIAGNOSIS — Z800 Family history of malignant neoplasm of digestive organs: Secondary | ICD-10-CM

## 2017-11-13 DIAGNOSIS — Z8042 Family history of malignant neoplasm of prostate: Secondary | ICD-10-CM

## 2017-11-13 DIAGNOSIS — Z803 Family history of malignant neoplasm of breast: Secondary | ICD-10-CM

## 2017-11-13 NOTE — Progress Notes (Signed)
HPI: Mr. Dymek was previously seen in the [place] clinic on 10/20/2017 due to a personal and family history of prostate cancer and concerns regarding a hereditary predisposition to cancer. Please refer to our prior cancer genetics clinic note for more information regarding Mr. Fan's medical, social and family histories, and our assessment and recommendations, at the time. Mr. Vanduyne recent genetic test results were disclosed to him, as well as recommendations warranted by these results. These results and recommendations are discussed in more detail below.  CANCER HISTORY:    Malignant neoplasm of prostate ([place])   08/31/2017 Initial Diagnosis    Malignant neoplasm of prostate ([place])      10/28/2017 Genetic Testing    The patient had genetic testing due to a personal history of prostate cancer and a family history of breast and stomach cancer.  The Common Hereditary Cancers Panel + Prostate Cancer Panel was ordered.  APC, ATM, AXIN2, BARD1, BMPR1A, BRCA1, BRCA2, BRIP1, CDH1, CDK4, CDKN2A (p14ARF), CDKN2A (p16INK4a), CHEK2, CTNNA1, DICER1, EPCAM*, FANCA, GREM1*, KIT, MEN1, MLH1, MSH2, MSH3, MSH6, MUTYH, NBN, NF1, PALB2, PDGFRA, PMS2, POLD1, POLE, PTEN, RAD50, RAD51C, RAD51D, SDHB, SDHC, SDHD, SMAD4, SMARCA4, STK11, TP53, TSC1, TSC2, VHL. The following genes were evaluated for sequence changes only: HOXB13*, NTHL1*, SDHA.   Results: Negative, no pathogenic variants identified. The date of this test report is 10/28/2017.         FAMILY HISTORY:  We obtained a detailed, 4-generation family history.  Significant diagnoses are listed below: Family History  Problem Relation Age of Onset  . Prostate cancer Brother 41  . Prostate cancer Brother        metastatic/ late treatment dx in 60's/70's  . Breast cancer Sister 63  . Prostate cancer Brother        dx in 60's/70's  . Prostate cancer Brother        dx 60's/70's  . Prostate cancer Brother        dx 60's/70's  . Prostate  cancer Brother        18's  . Stomach cancer Brother   . Prostate cancer Other   . Prostate cancer Other   . Lung cancer Sister     Mr. Sherrer has 3 daughters and a son.  His youngest daughter is 60 and his oldest child is his son, 1.  One of his daughter died at 73 due to heart disease.  He has 7 grandchildren, the oldest is 31.    Mr. Lando has 7 brothers and 4 sisters listed below: -1 brother was diagnosed with prostate cancer in his 3's -12 brother was diagnosed with prostate cancer in his 60's/70's -1 brother was diagnosed with prostate cancer in his 103's/70's and it was untreated and metastasized.  He died as a result of this cancer.  -1 brother has no history of prostate cancer.  -1 brother was diagnosed with prostate cancer in his 61's/70's. -1 brother diagnosed with prostate cancer in 60's/70's and also had stomach cancer.  He is deceased.  -1 brother was diagnosed with prostate cancer in his 8's/70's.  He has a son who has developed prostate cancer.  -1 sister diagnosed with breast cancer in her 25's, is now 41.   -2 sisters with no history of cancer.  One of these sisters had a son who has developed prostate cancer.  -1 sister was diagnosed with lung cancer.   Mr. Durr's father died at 71 with no history of cancer.  Mr. Archuleta has  limited knowledge about his paternal family's health history.  He has 13 paternal aunts/uncles and knows there is some cancer in them but does not know the type or any details.  He reports many of them had a history of smoking.  He has several paternal cousins, and is not aware of any history of cancer in any of them.  Mr. Worton does not know any information about his paternal grandparents.    Mr. Wilbon mother died at 58 with no history of cancer.  Mr. Stamey has 5 maternal aunts/uncles (is unsure how many of each).  He is not aware of any cancer history in these aunts/uncles.  He has several maternal cousins, one has a diagnosis of cancer,  but he is unsure what type this cousin has.  Mr. Gikas reports his maternal grandparents had no history of cancer he is aware of.   Mr. Grosso is unaware of previous family history of genetic testing for hereditary cancer risks. Patient's maternal ancestors are of African American/Native American descent, and paternal ancestors are of African American descent. There is no reported Ashkenazi Jewish ancestry. There is no known consanguinity.  GENETIC TEST RESULTS: Genetic testing performed through [facility] Common Hereditary Cancers Panel + Prostate Cancer Panel reported out on 10/28/2017 showed no pathogenic mutations. The following genes were evaluated for sequence changes and exonic deletions/duplications: APC, ATM, AXIN2, BARD1, BMPR1A, BRCA1, BRCA2, BRIP1, CDH1, CDK4, CDKN2A (p14ARF), CDKN2A (p16INK4a), CHEK2, CTNNA1, DICER1, EPCAM*, FANCA, GREM1*, KIT, MEN1, MLH1, MSH2, MSH3, MSH6, MUTYH, NBN, NF1, PALB2, PDGFRA, PMS2, POLD1, POLE, PTEN, RAD50, RAD51C, RAD51D, SDHB, SDHC, SDHD, SMAD4, SMARCA4, STK11, TP53, TSC1, TSC2, VHL. The following genes were evaluated for sequence changes only: HOXB13*, NTHL1*, SDHA.  The test report will be scanned into EPIC and will be located under the Molecular Pathology section of the Results Review tab.A portion of the result report is included below for reference.     We discussed with Mr. Cisek that because current genetic testing is not perfect, it is possible there may be a gene mutation in one of these genes that current testing cannot detect, but that chance is small. We also discussed, that there could be another gene that has not yet been discovered, or that we have not yet tested, that is responsible for the cancer diagnoses in the family. It is also possible there is a hereditary cause for the cancer in the family that Mr. Chapdelaine did not inherit and therefore was not identified in his testing.  Therefore, it is important to remain in touch with cancer genetics  in the future so that we can continue to offer Mr. Zaman the most up to date genetic testing.   ADDITIONAL GENETIC TESTING: We discussed with Mr. Samson that there are other genes that are associated with increased cancer risk that can be analyzed. The laboratories that offer this testing look at these additional genes via a hereditary cancer gene panel. Should Mr. Towry wish to pursue additional genetic testing, we are happy to discuss and coordinate this testing, at any time.    CANCER SCREENING RECOMMENDATIONS: Therefore, it is recommended he continue to follow the cancer management and screening guidelines provided by his oncology and primary healthcare provider. Other factors such as her personal and family history may still affect his cancer risk.  Given Mr. Burkholder's personal and family histories, we must consider these negative results carefully.  Families with features suggestive of hereditary risk for cancer tend to have multiple family members with cancer, diagnoses  in multiple generations, and diagnoses before the age of 58. Mr. Brahmbhatt family exhibits some of these features. Therefore this negative result may actually be due to the limitations of current technology to detect all mutations within these genes, or there may be a different gene that has not yet been discovered or tested.   It is also possible there is a genetic cause for the cancer in Mr. Guldin's family that he did not inherit and therefore was not identified in his genetic testing.   We recommend he continue to follow all of the cancer screening and management recommendations provided to him by his physicians.   RECOMMENDATIONS FOR FAMILY MEMBERS: Individuals in this family might be at some increased risk of developing cancer, over the general population risk, simply due to the family history of cancer. We recommended women in this family have a yearly mammogram beginning at age 41, or 80 years younger than the earliest  onset of cancer, an annual clinical breast exam, and perform monthly breast self-exams. Women in this family should also have a gynecological exam as recommended by their primary provider. All family members should have a colonoscopy by age 7.  Men in the family should tell their doctors about the family history of prostate cancer so they can make the most appropriate screening recommendations for them.  All family members should inform their physicians about the family history of cancer so their doctors can make the most appropriate screening recommendations for them.   Based on Mr. Woon's family history, we recommended his relatives, especially those affected with cancer,  have genetic counseling and testing. Mr. Ptacek will let us know if we can be of any assistance in coordinating genetic counseling and/or testing for these family members.   FOLLOW-UP: Lastly, we discussed with Mr. Rorie that cancer genetics is a rapidly advancing field and it is possible that new genetic tests will be appropriate for him and/or his family members in the future. We encouraged him to remain in contact with cancer genetics on an annual basis so we can update his personal and family histories and let him know of advances in cancer genetics that may benefit this family.   Our contact number was provided. Mr. Simmer questions were answered to his satisfaction, and he knows he is welcome to call us at anytime with additional questions or concerns.   Ferol Luz, MS, [facility] Certified Genetic Counselor [email or web address]

## 2017-12-10 ENCOUNTER — Other Ambulatory Visit: Payer: Self-pay

## 2017-12-10 ENCOUNTER — Encounter (HOSPITAL_BASED_OUTPATIENT_CLINIC_OR_DEPARTMENT_OTHER): Payer: Self-pay | Admitting: *Deleted

## 2017-12-10 NOTE — Progress Notes (Signed)
SPOKE W/ PT VIA PHONE FOR PRE-OP INTERVIEW.  NPO AFTER MN W/ EXCEPTION CLEAR LIQUIDS UNTIL 0800 (NO CREAM/ MILK PRODUCTS).  ARRIVE AT 1200.  NEEDS EKG AND ISTAT 8.  WILL TAKE CLARITIN AND PROPRANOLOL AM DOS W/ SIPS OF WATER AND DO ONE FLEET ENEMA AM DOS.

## 2017-12-15 DIAGNOSIS — C61 Malignant neoplasm of prostate: Secondary | ICD-10-CM | POA: Diagnosis not present

## 2017-12-23 ENCOUNTER — Ambulatory Visit (HOSPITAL_BASED_OUTPATIENT_CLINIC_OR_DEPARTMENT_OTHER): Payer: Medicare HMO | Admitting: Certified Registered"

## 2017-12-23 ENCOUNTER — Encounter (HOSPITAL_BASED_OUTPATIENT_CLINIC_OR_DEPARTMENT_OTHER)
Admission: RE | Disposition: A | Discharge status: Home / Self Care | Payer: Self-pay | Source: Ambulatory Visit | Attending: Urology

## 2017-12-23 ENCOUNTER — Ambulatory Visit (HOSPITAL_BASED_OUTPATIENT_CLINIC_OR_DEPARTMENT_OTHER)
Admission: RE | Admit: 2017-12-23 | Discharge: 2017-12-23 | Disposition: A | Discharge status: Home / Self Care | Payer: Medicare HMO | Source: Ambulatory Visit | Attending: Urology | Admitting: Urology

## 2017-12-23 ENCOUNTER — Encounter (HOSPITAL_BASED_OUTPATIENT_CLINIC_OR_DEPARTMENT_OTHER): Payer: Self-pay

## 2017-12-23 ENCOUNTER — Ambulatory Visit (HOSPITAL_COMMUNITY): Admission: RE | Admit: 2017-12-23 | Payer: Medicare HMO | Source: Ambulatory Visit | Admitting: Urology

## 2017-12-23 DIAGNOSIS — E7800 Pure hypercholesterolemia, unspecified: Secondary | ICD-10-CM | POA: Diagnosis not present

## 2017-12-23 DIAGNOSIS — K219 Gastro-esophageal reflux disease without esophagitis: Secondary | ICD-10-CM | POA: Diagnosis not present

## 2017-12-23 DIAGNOSIS — I10 Essential (primary) hypertension: Secondary | ICD-10-CM | POA: Insufficient documentation

## 2017-12-23 DIAGNOSIS — Z79899 Other long term (current) drug therapy: Secondary | ICD-10-CM | POA: Insufficient documentation

## 2017-12-23 DIAGNOSIS — C61 Malignant neoplasm of prostate: Secondary | ICD-10-CM | POA: Diagnosis not present

## 2017-12-23 DIAGNOSIS — Z87891 Personal history of nicotine dependence: Secondary | ICD-10-CM | POA: Insufficient documentation

## 2017-12-23 HISTORY — DX: Urgency of urination: R39.15

## 2017-12-23 HISTORY — PX: GOLD SEED IMPLANT: SHX6343

## 2017-12-23 HISTORY — DX: Personal history of colonic polyps: Z86.010

## 2017-12-23 HISTORY — DX: Presence of spectacles and contact lenses: Z97.3

## 2017-12-23 HISTORY — PX: SPACE OAR INSTILLATION: SHX6769

## 2017-12-23 HISTORY — DX: Nocturia: R35.1

## 2017-12-23 HISTORY — DX: Personal history of colon polyps, unspecified: Z86.0100

## 2017-12-23 LAB — POCT I-STAT, CHEM 8
BUN: 9 mg/dL (ref 6–20)
CALCIUM ION: 1.25 mmol/L (ref 1.15–1.40)
CHLORIDE: 100 mmol/L — AB (ref 101–111)
Creatinine, Ser: 1.1 mg/dL (ref 0.61–1.24)
Glucose, Bld: 90 mg/dL (ref 65–99)
HCT: 40 % (ref 39.0–52.0)
Hemoglobin: 13.6 g/dL (ref 13.0–17.0)
POTASSIUM: 4 mmol/L (ref 3.5–5.1)
SODIUM: 142 mmol/L (ref 135–145)
TCO2: 30 mmol/L (ref 22–32)

## 2017-12-23 SURGERY — INSERTION, GOLD SEEDS
Anesthesia: General | Site: Prostate

## 2017-12-23 MED ORDER — SODIUM CHLORIDE FLUSH 0.9 % IV SOLN
INTRAVENOUS | Status: DC | PRN
  Administered 2017-12-23: 50 mL

## 2017-12-23 MED ORDER — LACTATED RINGERS IV SOLN
INTRAVENOUS | Status: DC
  Administered 2017-12-23: 13:00:00 via INTRAVENOUS
  Filled 2017-12-23: qty 1000

## 2017-12-23 MED ORDER — FENTANYL CITRATE (PF) 100 MCG/2ML IJ SOLN
INTRAMUSCULAR | Status: AC
  Filled 2017-12-23: qty 2

## 2017-12-23 MED ORDER — FENTANYL CITRATE (PF) 100 MCG/2ML IJ SOLN
INTRAMUSCULAR | Status: DC | PRN
  Administered 2017-12-23: 50 ug via INTRAVENOUS

## 2017-12-23 MED ORDER — PROPOFOL 10 MG/ML IV BOLUS
INTRAVENOUS | Status: AC
  Filled 2017-12-23: qty 20

## 2017-12-23 MED ORDER — DEXAMETHASONE SODIUM PHOSPHATE 10 MG/ML IJ SOLN
INTRAMUSCULAR | Status: DC | PRN
  Administered 2017-12-23: 10 mg via INTRAVENOUS

## 2017-12-23 MED ORDER — LIDOCAINE 2% (20 MG/ML) 5 ML SYRINGE
INTRAMUSCULAR | Status: DC | PRN
  Administered 2017-12-23: 60 mg via INTRAVENOUS

## 2017-12-23 MED ORDER — LIDOCAINE 2% (20 MG/ML) 5 ML SYRINGE
INTRAMUSCULAR | Status: AC
  Filled 2017-12-23: qty 5

## 2017-12-23 MED ORDER — ONDANSETRON HCL 4 MG/2ML IJ SOLN
INTRAMUSCULAR | Status: AC
  Filled 2017-12-23: qty 2

## 2017-12-23 MED ORDER — FLEET ENEMA 7-19 GM/118ML RE ENEM
1.0000 | ENEMA | Freq: Once | RECTAL | Status: AC
  Administered 2017-12-23: 1 via RECTAL
  Filled 2017-12-23: qty 1

## 2017-12-23 MED ORDER — OXYCODONE HCL 5 MG/5ML PO SOLN
5.0000 mg | Freq: Once | ORAL | Status: DC | PRN
  Filled 2017-12-23: qty 5

## 2017-12-23 MED ORDER — DEXAMETHASONE SODIUM PHOSPHATE 10 MG/ML IJ SOLN
INTRAMUSCULAR | Status: AC
Start: 2017-12-23 — End: ?
  Filled 2017-12-23: qty 1

## 2017-12-23 MED ORDER — PROMETHAZINE HCL 25 MG/ML IJ SOLN
6.2500 mg | INTRAMUSCULAR | Status: DC | PRN
  Filled 2017-12-23: qty 1

## 2017-12-23 MED ORDER — GENTAMICIN SULFATE 40 MG/ML IJ SOLN
5.0000 mg/kg | INTRAVENOUS | Status: AC
  Administered 2017-12-23: 370 mg via INTRAVENOUS
  Filled 2017-12-23 (×2): qty 9.25

## 2017-12-23 MED ORDER — FENTANYL CITRATE (PF) 100 MCG/2ML IJ SOLN
25.0000 ug | INTRAMUSCULAR | Status: DC | PRN
  Administered 2017-12-23: 25 ug via INTRAVENOUS
  Filled 2017-12-23: qty 1

## 2017-12-23 MED ORDER — OXYCODONE HCL 5 MG PO TABS
5.0000 mg | ORAL_TABLET | Freq: Once | ORAL | Status: DC | PRN
  Filled 2017-12-23: qty 1

## 2017-12-23 MED ORDER — TRAMADOL HCL 50 MG PO TABS: 50.0000 mg | ORAL_TABLET | Freq: Four times a day (QID) | ORAL | 0 refills | Status: AC | PRN

## 2017-12-23 MED ORDER — MIDAZOLAM HCL 2 MG/2ML IJ SOLN
INTRAMUSCULAR | Status: AC
  Filled 2017-12-23: qty 2

## 2017-12-23 MED ORDER — ONDANSETRON HCL 4 MG/2ML IJ SOLN
INTRAMUSCULAR | Status: DC | PRN
  Administered 2017-12-23: 4 mg via INTRAVENOUS

## 2017-12-23 MED ORDER — PROPOFOL 10 MG/ML IV BOLUS
INTRAVENOUS | Status: DC | PRN
  Administered 2017-12-23: 170 mg via INTRAVENOUS

## 2017-12-23 SURGICAL SUPPLY — 15 items
COVER TABLE BACK 60X90 (DRAPES) ×3 IMPLANT
DRSG TEGADERM 4X4.75 (GAUZE/BANDAGES/DRESSINGS) ×4 IMPLANT
DRSG TEGADERM 8X12 (GAUZE/BANDAGES/DRESSINGS) ×6 IMPLANT
GAUZE SPONGE 4X4 12PLY STRL LF (GAUZE/BANDAGES/DRESSINGS) ×2 IMPLANT
GLOVE BIO SURGEON STRL SZ7.5 (GLOVE) ×3 IMPLANT
GLOVE ECLIPSE 8.0 STRL XLNG CF (GLOVE) ×3 IMPLANT
GOWN STRL REUS W/TWL XL LVL3 (GOWN DISPOSABLE) ×3 IMPLANT
IMPL SPACEOAR SYSTEM 10ML (MISCELLANEOUS) ×1 IMPLANT
IMPLANT SPACEOAR SYSTEM 10ML (MISCELLANEOUS) ×3
KIT TURNOVER CYSTO (KITS) ×3 IMPLANT
MARKER GOLD PRELOAD 1.2X3 (Urological Implant) ×1 IMPLANT
PACK CYSTO (CUSTOM PROCEDURE TRAY) ×3 IMPLANT
SEED GOLD PRELOAD 1.2X3 (Urological Implant) ×3 IMPLANT
SURGILUBE 2OZ TUBE FLIPTOP (MISCELLANEOUS) ×3 IMPLANT
UNDERPAD 30X30 (UNDERPADS AND DIAPERS) ×6 IMPLANT

## 2017-12-23 NOTE — Anesthesia Procedure Notes (Signed)
Procedure Name: LMA Insertion Date/Time: 12/23/2017 2:40 PM Performed by: Suan Halter, CRNA Pre-anesthesia Checklist: Patient identified, Emergency Drugs available, Suction available and Patient being monitored Patient Re-evaluated:Patient Re-evaluated prior to induction Oxygen Delivery Method: Circle system utilized Preoxygenation: Pre-oxygenation with 100% oxygen Induction Type: IV induction Ventilation: Mask ventilation without difficulty LMA: LMA inserted LMA Size: 4.0 Number of attempts: 1 Airway Equipment and Method: Bite block Placement Confirmation: positive ETCO2 Tube secured with: Tape Dental Injury: Teeth and Oropharynx as per pre-operative assessment

## 2017-12-23 NOTE — Discharge Instructions (Signed)
1 - You may have urinary urgency (bladder spasms) and bloody urine on / off x few days. This is normal. ° °2 - Call MD or go to ER for fever >102, severe pain / nausea / vomiting not relieved by medications, or acute change in medical status ° °Post Anesthesia Home Care Instructions ° °Activity: °Get plenty of rest for the remainder of the day. A responsible individual must stay with you for 24 hours following the procedure.  °For the next 24 hours, DO NOT: °-Drive a car °-Operate machinery °-Drink alcoholic beverages °-Take any medication unless instructed by your physician °-Make any legal decisions or sign important papers. ° °Meals: °Start with liquid foods such as gelatin or soup. Progress to regular foods as tolerated. Avoid greasy, spicy, heavy foods. If nausea and/or vomiting occur, drink only clear liquids until the nausea and/or vomiting subsides. Call your physician if vomiting continues. ° °Special Instructions/Symptoms: °Your throat may feel dry or sore from the anesthesia or the breathing tube placed in your throat during surgery. If this causes discomfort, gargle with warm salt water. The discomfort should disappear within 24 hours. ° °If you had a scopolamine patch placed behind your ear for the management of post- operative nausea and/or vomiting: ° °1. The medication in the patch is effective for 72 hours, after which it should be removed.  Wrap patch in a tissue and discard in the trash. Wash hands thoroughly with soap and water. °2. You may remove the patch earlier than 72 hours if you experience unpleasant side effects which may include dry mouth, dizziness or visual disturbances. °3. Avoid touching the patch. Wash your hands with soap and water after contact with the patch. °   ° °

## 2017-12-23 NOTE — Anesthesia Postprocedure Evaluation (Signed)
Anesthesia Post Note  Patient: Ryan Wilkerson  Procedure(s) Performed: GOLD SEED IMPLANT (N/A Prostate) SPACE OAR INSTILLATION (N/A Prostate)     Patient location during evaluation: PACU Anesthesia Type: General Level of consciousness: awake and alert and oriented Pain management: pain level controlled Vital Signs Assessment: post-procedure vital signs reviewed and stable Respiratory status: spontaneous breathing, nonlabored ventilation and respiratory function stable Cardiovascular status: blood pressure returned to baseline and stable Postop Assessment: no apparent nausea or vomiting Anesthetic complications: no    Last Vitals:  Vitals:   12/23/17 1634 12/23/17 1655  BP:  (!) 131/53  Pulse: (!) 48 (!) 53  Resp: 12 16  Temp:  36.6 C  SpO2: 98% 100%    Last Pain:  Vitals:   12/23/17 1655  TempSrc:   PainSc: 3                  Jakera Beaupre A.

## 2017-12-23 NOTE — Anesthesia Preprocedure Evaluation (Signed)
Anesthesia Evaluation  Patient identified by MRN, date of birth, ID band Patient awake    Reviewed: Allergy & Precautions, NPO status , Patient's Chart, lab work & pertinent test results  Airway Mallampati: II  TM Distance: >3 FB Neck ROM: Full    Dental no notable dental hx.    Pulmonary neg pulmonary ROS, former smoker,    Pulmonary exam normal breath sounds clear to auscultation       Cardiovascular hypertension, Pt. on medications Normal cardiovascular exam Rhythm:Regular Rate:Normal     Neuro/Psych negative neurological ROS  negative psych ROS   GI/Hepatic Neg liver ROS, GERD  ,  Endo/Other  negative endocrine ROS  Renal/GU negative Renal ROS  negative genitourinary   Musculoskeletal negative musculoskeletal ROS (+)   Abdominal   Peds negative pediatric ROS (+)  Hematology negative hematology ROS (+)   Anesthesia Other Findings   Reproductive/Obstetrics negative OB ROS                             Anesthesia Physical Anesthesia Plan  ASA: II  Anesthesia Plan: General   Post-op Pain Management:    Induction: Intravenous  PONV Risk Score and Plan: 2 and Ondansetron, Dexamethasone and Treatment may vary due to age or medical condition  Airway Management Planned: LMA  Additional Equipment:   Intra-op Plan:   Post-operative Plan:   Informed Consent: I have reviewed the patients History and Physical, chart, labs and discussed the procedure including the risks, benefits and alternatives for the proposed anesthesia with the patient or authorized representative who has indicated his/her understanding and acceptance.   Dental advisory given  Plan Discussed with: CRNA and Surgeon  Anesthesia Plan Comments:         Anesthesia Quick Evaluation

## 2017-12-23 NOTE — Transfer of Care (Signed)
Immediate Anesthesia Transfer of Care Note  Patient: Ryan Wilkerson  Procedure(s) Performed: Procedure(s) (LRB): GOLD SEED IMPLANT (N/A) SPACE OAR INSTILLATION (N/A)  Patient Location: PACU  Anesthesia Type: General  Level of Consciousness: awake, oriented, sedated and patient cooperative  Airway & Oxygen Therapy: Patient Spontanous Breathing and Patient connected to face mask oxygen  Post-op Assessment: Report given to PACU RN and Post -op Vital signs reviewed and stable  Post vital signs: Reviewed and stable  Complications: No apparent anesthesia complications Last Pain:  Vitals:   12/23/17 1243  TempSrc:   PainSc: 0-No pain      Patients Stated Pain Goal: 4 (12/23/17 1243)

## 2017-12-23 NOTE — Brief Op Note (Signed)
12/23/2017  2:51 PM  PATIENT:  Ryan Wilkerson  72 y.o. male  PRE-OPERATIVE DIAGNOSIS:  PROSTATE CANCER  POST-OPERATIVE DIAGNOSIS:  PROSTATE CANCER  PROCEDURE:  Procedure(s): GOLD SEED IMPLANT (N/A) SPACE OAR INSTILLATION (N/A)  SURGEON:  Surgeon(s) and Role:    * Alexis Frock, MD - Primary    * Tyler Pita, MD - Assisting  PHYSICIAN ASSISTANT:   ASSISTANTS: none   ANESTHESIA:   general  EBL:  0 mL   BLOOD ADMINISTERED:none  DRAINS: none   LOCAL MEDICATIONS USED:  NONE  SPECIMEN:  No Specimen  DISPOSITION OF SPECIMEN:  N/A  COUNTS:  YES  TOURNIQUET:  * No tourniquets in log *  DICTATION: .Other Dictation: Dictation Number [id number]  PLAN OF CARE: Discharge to home after PACU  PATIENT DISPOSITION:  PACU - hemodynamically stable.   Delay start of Pharmacological VTE agent (>24hrs) due to surgical blood loss or risk of bleeding: yes

## 2017-12-23 NOTE — H&P (Signed)
Ryan Wilkerson is an 72 y.o. male.    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 TRUS 38mL without median lobe. He has two brothers with non-lethal prostate cancer. Despite being outside all surveillance protocols, he has opted for initial surveillance.   Recent Course:  01/2017 - PSA 6.75 / DRE 45gm smooth  09/2017 - PSA 8.48 TRUS BX 37mL, no median lobe. Gleason 4+3=7 in up to 60% 3 cores LMB, LMB, LLM and 2 more cores Gleason 6 RMM RLA. ==> primary radiotherapy given volume progression.    PMH sig for HTN, HLD. NO CV disease / blood thinners. NO prior surgeries. 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 His PCP is Ryan Coffin MD with [facility].   Today " [name] " is seen to proceed with prostate fiducial marker and space-oar placement priro to external beam radiation for progressive prostate cancer. Most recent UA without infectious parameters.   Past Medical History:  Diagnosis Date  . Erectile dysfunction   . Essential tremor   . Family history of breast cancer   . Family history of prostate cancer   . Family history of stomach cancer   . GERD (gastroesophageal reflux disease)   . History of colon polyps   . Hypercholesterolemia   . Hypertension   . Nocturia   . Prostate cancer [facility]) urologist-- dr Tresa Moore /  oncologist-- dr manning   prostate bx 06-24-2016 and 09-15-2017 at dr Tresa Moore office  Stage T1c, Gleason 4+3, PSA 8.48, vol 33cc---  planned external beam radiation therpay  . Urgency of urination   . Wears glasses     Past Surgical History:  Procedure Laterality Date  . COLONOSCOPY  last one 06/ 2018  . PROSTATE BIOPSY  06-24-2016;  09-15-2017-- at dr Tresa Moore office    Family History  Problem Relation Age of Onset  . Prostate cancer Brother 85  . Prostate  cancer Brother        metastatic/ late treatment dx in 60's/70's  . Breast cancer Sister 93  . Prostate cancer Brother        dx in 60's/70's  . Prostate cancer Brother        dx 60's/70's  . Prostate cancer Brother        dx 60's/70's  . Prostate cancer Brother        24's  . Stomach cancer Brother   . Prostate cancer Other   . Prostate cancer Other   . Lung cancer Sister    Social History:  reports that he quit smoking about 43 years ago. His smoking use included cigarettes. He has a 7.00 pack-year smoking history. He has never used smokeless tobacco. He reports that he does not drink alcohol or use drugs.  Allergies:  Allergies  Allergen Reactions  . Aspirin Nausea And Vomiting and Other (See Comments)    "sharp stomach pain and excessive saliva"  . Simvastatin Other (See Comments)    "back and mid section achy pain"    No medications prior to admission.    No results found for this or any previous visit (from the past 48 hour(s)). No results found.  Review of Systems  Constitutional: Negative.   HENT: Negative.   Eyes: Negative.   Respiratory: Negative.   Cardiovascular: Negative.  Gastrointestinal: Negative.   Genitourinary: Negative.   Musculoskeletal: Negative.   Skin: Negative.   Neurological: Negative.   Endo/Heme/Allergies: Negative.   Psychiatric/Behavioral: Negative.     Height 5' 7.5" (1.715 m), weight 73.5 kg (162 lb). Physical Exam  Constitutional: He appears well-developed.  HENT:  Head: Normocephalic.  Eyes: Pupils are equal, round, and reactive to light.  Neck: Normal range of motion.  Cardiovascular: Normal rate.  Respiratory: Effort normal.  GI: Soft.  Genitourinary:  Genitourinary Comments: No CVAT  Musculoskeletal: Normal range of motion.  Neurological: He is alert.  Skin: Skin is warm.  Psychiatric: He has a normal mood and affect.     Assessment/Plan  1 - Moderate Risk Prostate Cancer - proceed as planned with fiducial  marker and space-oar placement. Risks, benefits, alternatives, expected peri-op course discussed previously and reiterated today.   Alexis Frock, MD 12/23/2017, 8:16 AM

## 2017-12-24 ENCOUNTER — Telehealth: Payer: Self-pay | Admitting: *Deleted

## 2017-12-24 ENCOUNTER — Encounter (HOSPITAL_BASED_OUTPATIENT_CLINIC_OR_DEPARTMENT_OTHER): Payer: Self-pay | Admitting: Urology

## 2017-12-24 NOTE — Telephone Encounter (Signed)
Called patient to inform of MRI and sim for 12-25-17, lvm for a return call

## 2017-12-24 NOTE — Op Note (Signed)
NAME:  Ryan Wilkerson, Ryan Wilkerson NO.:  MEDICAL RECORD NO.:  [id number]  LOCATION:                                 FACILITY:  PHYSICIAN:  Alexis Frock, MD          DATE OF BIRTH:  DATE OF PROCEDURE: 12/23/2017                               OPERATIVE REPORT   PREOPERATIVE DIAGNOSIS:  Moderate-risk prostate cancer.  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  PROCEDURES: 1. SpaceOAR placement. 2. Fiducial marker placement.  ESTIMATED BLOOD LOSS:  Nil.  COMPLICATION:  None.  SPECIMEN:  None.  FINDINGS: 1. Successful placement of fiducial markers, right mid base, right mid     apex, left lateral mid. 2. Successful placement of 10 mL of SpaceOAR into the perirectal fat.  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  He has had persistent elevation of his PSA, has been climbing over time.  It was felt that a repeat surveillance biopsy was warranted and this did show significant progression of volume and disease.  Given his overall good health, it was felt that primary therapy was warranted and he wished to proceed with primary radiotherapy.  As a part of this, he presents for fiducial marker placement and SpaceOAR placement today to optimize radiation planning and reduce rectal toxicity.  Informed consent was obtained and placed in the medical record.  PROCEDURE IN DETAIL:  The patient was verified.  Procedure being fiducial marker placement and SpaceOAR placement was confirmed. Procedure was carried out.  Time-out was performed.  Intravenous antibiotics were administered.  General LMA anesthesia was introduced. The patient was placed into a medium lithotomy position.  Sterile field was created by prepping and draping the patient's perineum after clipper shaving using  iodine.  The transrectal ultrasound with stepper was introduced per rectum and ultrasound images were obtained in the axial and sagittal planes.  Initial attention was directed at fiducial marker placement.  A single goal fiducial marker was placed in the right mid base, right mid apex, right lateral mid as per plan.  Attention was then directed at [facility] placement included finder needle was used via perineal approach, was introduced in the area of mid-gland prostate just within the perirectal fat.  A test injection of saline was performed, which revealed placement in the proper plane and perirectal fat.  Next, 10 mL total of the gel matrix was infused over period of 10 seconds, this resulted in excellent displacement of the anterior rectal wall away from the posterior aspect of the prostate bilaterally.  The rectal ultrasound probe was removed as was the SpaceOAR introducer needle and the procedure was terminated.  The patient tolerated the procedure well.  There were no immediate periprocedural complications. The patient was taken to the Postanesthesia Care Unit in stable condition.          ______________________________ Alexis Frock, MD     TM/MEDQ  D:  12/23/2017  T:  12/23/2017  Job:  [id number]

## 2017-12-25 ENCOUNTER — Ambulatory Visit
Admission: RE | Admit: 2017-12-25 | Discharge: 2017-12-25 | Disposition: A | Discharge status: Home / Self Care | Payer: Non-veteran care | Source: Ambulatory Visit | Attending: Radiation Oncology | Admitting: Radiation Oncology

## 2017-12-25 ENCOUNTER — Ambulatory Visit (HOSPITAL_COMMUNITY)
Admission: RE | Admit: 2017-12-25 | Discharge: 2017-12-25 | Disposition: A | Discharge status: Home / Self Care | Payer: Non-veteran care | Source: Ambulatory Visit | Attending: Urology | Admitting: Urology

## 2017-12-25 ENCOUNTER — Encounter: Payer: Self-pay | Admitting: Medical Oncology

## 2017-12-25 DIAGNOSIS — C61 Malignant neoplasm of prostate: Secondary | ICD-10-CM | POA: Insufficient documentation

## 2017-12-25 DIAGNOSIS — Z510 Encounter for antineoplastic radiation therapy: Secondary | ICD-10-CM | POA: Insufficient documentation

## 2017-12-25 NOTE — Progress Notes (Signed)
I met [name] in January for initial consult. He underwent a repeat biopsy 09/15/17 with Dr. Tresa Moore. He had a rise in PSA and increase number of cores. He has elected 5 1/2 weeks of radiation. He states he had  MRI of brain today due to tumors and loss of function of left hand. 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 He is hoping he does not have parkinson's disease. We discussed radiation and planning and all his questions were answered. He will begin treatments 01/05/18. He has my business card and I asked him to call with questions or concerns. He voiced understanding.

## 2017-12-25 NOTE — Progress Notes (Signed)
  Radiation Oncology         (336) [phone] ________________________________  Name: Ryan Wilkerson MRN: [id number]  Date: 12/25/2017  DOB: 01-Dec-1945  SIMULATION AND TREATMENT PLANNING NOTE    ICD-10-CM   1. Malignant neoplasm of prostate ([place]) C61     DIAGNOSIS:  72 year-old gentleman with Stage T1c adenocarcinoma of the prostate with Gleason Score of 4+3, and PSA of 8.48.    NARRATIVE:  The patient was brought to the [facility].  Identity was confirmed.  All relevant records and images related to the planned course of therapy were reviewed.  The patient freely provided informed written consent to proceed with treatment after reviewing the details related to the planned course of therapy. The consent form was witnessed and verified by the simulation staff.  Then, the patient was set-up in a stable reproducible supine position for radiation therapy.  A vacuum lock pillow device was custom fabricated to position his legs in a reproducible immobilized position.  Then, I performed a urethrogram under sterile conditions to identify the prostatic apex.  CT images were obtained.  Surface markings were placed.  The CT images were loaded into the planning software.  Then the prostate target and avoidance structures including the rectum, bladder, bowel and hips were contoured.  Treatment planning then occurred.  The radiation prescription was entered and confirmed.  A total of one complex treatment devices was fabricated. I have requested : Intensity Modulated Radiotherapy (IMRT) is medically necessary for this case for the following reason:  Rectal sparing.Marland Kitchen  PLAN:  The patient will receive 70 Gy in 28 fractions.  ________________________________  Sheral Apley Tammi Klippel, M.D.     This document serves as a record of services personally performed by Tyler Pita, MD. It was created on his behalf by Arlyce Harman, a trained medical scribe. The creation of this record is based on  the scribe's personal observations and the provider's statements to them. This document has been checked and approved by the attending provider.

## 2018-01-04 ENCOUNTER — Ambulatory Visit: Payer: Non-veteran care

## 2018-01-04 DIAGNOSIS — Z510 Encounter for antineoplastic radiation therapy: Secondary | ICD-10-CM | POA: Diagnosis present

## 2018-01-04 DIAGNOSIS — C61 Malignant neoplasm of prostate: Secondary | ICD-10-CM | POA: Insufficient documentation

## 2018-01-05 ENCOUNTER — Ambulatory Visit
Admission: RE | Admit: 2018-01-05 | Discharge: 2018-01-05 | Disposition: A | Discharge status: Home / Self Care | Payer: Non-veteran care | Source: Ambulatory Visit | Attending: Radiation Oncology | Admitting: Radiation Oncology

## 2018-01-05 DIAGNOSIS — Z510 Encounter for antineoplastic radiation therapy: Secondary | ICD-10-CM | POA: Diagnosis not present

## 2018-01-06 ENCOUNTER — Ambulatory Visit
Admission: RE | Admit: 2018-01-06 | Discharge: 2018-01-06 | Disposition: A | Discharge status: Home / Self Care | Payer: Non-veteran care | Source: Ambulatory Visit | Attending: Radiation Oncology | Admitting: Radiation Oncology

## 2018-01-06 DIAGNOSIS — Z510 Encounter for antineoplastic radiation therapy: Secondary | ICD-10-CM | POA: Diagnosis not present

## 2018-01-07 ENCOUNTER — Ambulatory Visit
Admission: RE | Admit: 2018-01-07 | Discharge: 2018-01-07 | Disposition: A | Discharge status: Home / Self Care | Payer: Non-veteran care | Source: Ambulatory Visit | Attending: Radiation Oncology | Admitting: Radiation Oncology

## 2018-01-07 DIAGNOSIS — Z510 Encounter for antineoplastic radiation therapy: Secondary | ICD-10-CM | POA: Diagnosis not present

## 2018-01-08 ENCOUNTER — Ambulatory Visit
Admission: RE | Admit: 2018-01-08 | Discharge: 2018-01-08 | Disposition: A | Discharge status: Home / Self Care | Payer: Non-veteran care | Source: Ambulatory Visit | Attending: Radiation Oncology | Admitting: Radiation Oncology

## 2018-01-08 DIAGNOSIS — Z510 Encounter for antineoplastic radiation therapy: Secondary | ICD-10-CM | POA: Diagnosis not present

## 2018-01-11 ENCOUNTER — Ambulatory Visit
Admission: RE | Admit: 2018-01-11 | Discharge: 2018-01-11 | Disposition: A | Discharge status: Home / Self Care | Payer: Non-veteran care | Source: Ambulatory Visit | Attending: Radiation Oncology | Admitting: Radiation Oncology

## 2018-01-11 DIAGNOSIS — Z510 Encounter for antineoplastic radiation therapy: Secondary | ICD-10-CM | POA: Diagnosis not present

## 2018-01-12 ENCOUNTER — Ambulatory Visit
Admission: RE | Admit: 2018-01-12 | Discharge: 2018-01-12 | Disposition: A | Discharge status: Home / Self Care | Payer: Non-veteran care | Source: Ambulatory Visit | Attending: Radiation Oncology | Admitting: Radiation Oncology

## 2018-01-12 DIAGNOSIS — Z510 Encounter for antineoplastic radiation therapy: Secondary | ICD-10-CM | POA: Diagnosis not present

## 2018-01-13 ENCOUNTER — Ambulatory Visit
Admission: RE | Admit: 2018-01-13 | Discharge: 2018-01-13 | Disposition: A | Discharge status: Home / Self Care | Payer: Non-veteran care | Source: Ambulatory Visit | Attending: Radiation Oncology | Admitting: Radiation Oncology

## 2018-01-13 DIAGNOSIS — Z510 Encounter for antineoplastic radiation therapy: Secondary | ICD-10-CM | POA: Diagnosis not present

## 2018-01-14 ENCOUNTER — Ambulatory Visit
Admission: RE | Admit: 2018-01-14 | Discharge: 2018-01-14 | Disposition: A | Discharge status: Home / Self Care | Payer: Non-veteran care | Source: Ambulatory Visit | Attending: Radiation Oncology | Admitting: Radiation Oncology

## 2018-01-14 DIAGNOSIS — Z510 Encounter for antineoplastic radiation therapy: Secondary | ICD-10-CM | POA: Diagnosis not present

## 2018-01-15 ENCOUNTER — Ambulatory Visit
Admission: RE | Admit: 2018-01-15 | Discharge: 2018-01-15 | Disposition: A | Discharge status: Home / Self Care | Payer: Non-veteran care | Source: Ambulatory Visit | Attending: Radiation Oncology | Admitting: Radiation Oncology

## 2018-01-15 DIAGNOSIS — Z510 Encounter for antineoplastic radiation therapy: Secondary | ICD-10-CM | POA: Diagnosis not present

## 2018-01-18 ENCOUNTER — Ambulatory Visit
Admission: RE | Admit: 2018-01-18 | Discharge: 2018-01-18 | Disposition: A | Discharge status: Home / Self Care | Payer: Non-veteran care | Source: Ambulatory Visit | Attending: Radiation Oncology | Admitting: Radiation Oncology

## 2018-01-18 DIAGNOSIS — Z510 Encounter for antineoplastic radiation therapy: Secondary | ICD-10-CM | POA: Diagnosis not present

## 2018-01-19 ENCOUNTER — Ambulatory Visit
Admission: RE | Admit: 2018-01-19 | Discharge: 2018-01-19 | Disposition: A | Discharge status: Home / Self Care | Payer: Non-veteran care | Source: Ambulatory Visit | Attending: Radiation Oncology | Admitting: Radiation Oncology

## 2018-01-19 DIAGNOSIS — Z510 Encounter for antineoplastic radiation therapy: Secondary | ICD-10-CM | POA: Diagnosis not present

## 2018-01-20 ENCOUNTER — Ambulatory Visit
Admission: RE | Admit: 2018-01-20 | Discharge: 2018-01-20 | Disposition: A | Discharge status: Home / Self Care | Payer: Non-veteran care | Source: Ambulatory Visit | Attending: Radiation Oncology | Admitting: Radiation Oncology

## 2018-01-20 DIAGNOSIS — Z510 Encounter for antineoplastic radiation therapy: Secondary | ICD-10-CM | POA: Diagnosis not present

## 2018-01-21 ENCOUNTER — Ambulatory Visit
Admission: RE | Admit: 2018-01-21 | Discharge: 2018-01-21 | Disposition: A | Discharge status: Home / Self Care | Payer: Non-veteran care | Source: Ambulatory Visit | Attending: Radiation Oncology | Admitting: Radiation Oncology

## 2018-01-21 DIAGNOSIS — Z510 Encounter for antineoplastic radiation therapy: Secondary | ICD-10-CM | POA: Diagnosis not present

## 2018-01-22 ENCOUNTER — Ambulatory Visit
Admission: RE | Admit: 2018-01-22 | Discharge: 2018-01-22 | Disposition: A | Discharge status: Home / Self Care | Payer: Non-veteran care | Source: Ambulatory Visit | Attending: Radiation Oncology | Admitting: Radiation Oncology

## 2018-01-22 DIAGNOSIS — Z510 Encounter for antineoplastic radiation therapy: Secondary | ICD-10-CM | POA: Diagnosis not present

## 2018-01-26 ENCOUNTER — Encounter (HOSPITAL_BASED_OUTPATIENT_CLINIC_OR_DEPARTMENT_OTHER): Payer: Self-pay | Admitting: Urology

## 2018-01-26 ENCOUNTER — Ambulatory Visit
Admission: RE | Admit: 2018-01-26 | Discharge: 2018-01-26 | Disposition: A | Discharge status: Home / Self Care | Payer: Non-veteran care | Source: Ambulatory Visit | Attending: Radiation Oncology | Admitting: Radiation Oncology

## 2018-01-26 DIAGNOSIS — Z510 Encounter for antineoplastic radiation therapy: Secondary | ICD-10-CM | POA: Diagnosis not present

## 2018-01-27 ENCOUNTER — Ambulatory Visit
Admission: RE | Admit: 2018-01-27 | Discharge: 2018-01-27 | Disposition: A | Discharge status: Home / Self Care | Payer: Non-veteran care | Source: Ambulatory Visit | Attending: Radiation Oncology | Admitting: Radiation Oncology

## 2018-01-27 DIAGNOSIS — Z510 Encounter for antineoplastic radiation therapy: Secondary | ICD-10-CM | POA: Diagnosis not present

## 2018-01-28 ENCOUNTER — Ambulatory Visit
Admission: RE | Admit: 2018-01-28 | Discharge: 2018-01-28 | Disposition: A | Discharge status: Home / Self Care | Payer: Non-veteran care | Source: Ambulatory Visit | Attending: Radiation Oncology | Admitting: Radiation Oncology

## 2018-01-28 DIAGNOSIS — Z510 Encounter for antineoplastic radiation therapy: Secondary | ICD-10-CM | POA: Diagnosis not present

## 2018-01-29 ENCOUNTER — Ambulatory Visit
Admission: RE | Admit: 2018-01-29 | Discharge: 2018-01-29 | Disposition: A | Discharge status: Home / Self Care | Payer: Non-veteran care | Source: Ambulatory Visit | Attending: Radiation Oncology | Admitting: Radiation Oncology

## 2018-01-29 DIAGNOSIS — Z510 Encounter for antineoplastic radiation therapy: Secondary | ICD-10-CM | POA: Diagnosis not present

## 2018-02-01 ENCOUNTER — Ambulatory Visit
Admission: RE | Admit: 2018-02-01 | Discharge: 2018-02-01 | Disposition: A | Discharge status: Home / Self Care | Payer: Non-veteran care | Source: Ambulatory Visit | Attending: Radiation Oncology | Admitting: Radiation Oncology

## 2018-02-01 DIAGNOSIS — C61 Malignant neoplasm of prostate: Secondary | ICD-10-CM | POA: Insufficient documentation

## 2018-02-01 DIAGNOSIS — Z510 Encounter for antineoplastic radiation therapy: Secondary | ICD-10-CM | POA: Diagnosis not present

## 2018-02-02 ENCOUNTER — Ambulatory Visit
Admission: RE | Admit: 2018-02-02 | Discharge: 2018-02-02 | Disposition: A | Discharge status: Home / Self Care | Payer: Non-veteran care | Source: Ambulatory Visit | Attending: Radiation Oncology | Admitting: Radiation Oncology

## 2018-02-02 DIAGNOSIS — Z510 Encounter for antineoplastic radiation therapy: Secondary | ICD-10-CM | POA: Diagnosis not present

## 2018-02-03 ENCOUNTER — Ambulatory Visit
Admission: RE | Admit: 2018-02-03 | Discharge: 2018-02-03 | Disposition: A | Discharge status: Home / Self Care | Payer: Non-veteran care | Source: Ambulatory Visit | Attending: Radiation Oncology | Admitting: Radiation Oncology

## 2018-02-03 DIAGNOSIS — Z510 Encounter for antineoplastic radiation therapy: Secondary | ICD-10-CM | POA: Diagnosis not present

## 2018-02-04 ENCOUNTER — Ambulatory Visit
Admission: RE | Admit: 2018-02-04 | Discharge: 2018-02-04 | Disposition: A | Discharge status: Home / Self Care | Payer: Non-veteran care | Source: Ambulatory Visit | Attending: Radiation Oncology | Admitting: Radiation Oncology

## 2018-02-04 DIAGNOSIS — Z510 Encounter for antineoplastic radiation therapy: Secondary | ICD-10-CM | POA: Diagnosis not present

## 2018-02-05 ENCOUNTER — Ambulatory Visit
Admission: RE | Admit: 2018-02-05 | Discharge: 2018-02-05 | Disposition: A | Discharge status: Home / Self Care | Payer: Non-veteran care | Source: Ambulatory Visit | Attending: Radiation Oncology | Admitting: Radiation Oncology

## 2018-02-05 DIAGNOSIS — Z510 Encounter for antineoplastic radiation therapy: Secondary | ICD-10-CM | POA: Diagnosis not present

## 2018-02-08 ENCOUNTER — Ambulatory Visit
Admission: RE | Admit: 2018-02-08 | Discharge: 2018-02-08 | Disposition: A | Discharge status: Home / Self Care | Payer: Non-veteran care | Source: Ambulatory Visit | Attending: Radiation Oncology | Admitting: Radiation Oncology

## 2018-02-08 DIAGNOSIS — Z510 Encounter for antineoplastic radiation therapy: Secondary | ICD-10-CM | POA: Diagnosis not present

## 2018-02-09 ENCOUNTER — Ambulatory Visit
Admission: RE | Admit: 2018-02-09 | Discharge: 2018-02-09 | Disposition: A | Discharge status: Home / Self Care | Payer: Non-veteran care | Source: Ambulatory Visit | Attending: Radiation Oncology | Admitting: Radiation Oncology

## 2018-02-09 DIAGNOSIS — Z510 Encounter for antineoplastic radiation therapy: Secondary | ICD-10-CM | POA: Diagnosis not present

## 2018-02-10 ENCOUNTER — Ambulatory Visit
Admission: RE | Admit: 2018-02-10 | Discharge: 2018-02-10 | Disposition: A | Discharge status: Home / Self Care | Payer: Non-veteran care | Source: Ambulatory Visit | Attending: Radiation Oncology | Admitting: Radiation Oncology

## 2018-02-10 DIAGNOSIS — Z510 Encounter for antineoplastic radiation therapy: Secondary | ICD-10-CM | POA: Diagnosis not present

## 2018-02-11 ENCOUNTER — Ambulatory Visit
Admission: RE | Admit: 2018-02-11 | Discharge: 2018-02-11 | Disposition: A | Discharge status: Home / Self Care | Payer: Non-veteran care | Source: Ambulatory Visit | Attending: Radiation Oncology | Admitting: Radiation Oncology

## 2018-02-11 DIAGNOSIS — Z510 Encounter for antineoplastic radiation therapy: Secondary | ICD-10-CM | POA: Diagnosis not present

## 2018-02-12 ENCOUNTER — Ambulatory Visit
Admission: RE | Admit: 2018-02-12 | Discharge: 2018-02-12 | Disposition: A | Discharge status: Home / Self Care | Payer: Non-veteran care | Source: Ambulatory Visit | Attending: Radiation Oncology | Admitting: Radiation Oncology

## 2018-02-12 ENCOUNTER — Encounter: Payer: Self-pay | Admitting: Radiation Oncology

## 2018-02-12 DIAGNOSIS — Z510 Encounter for antineoplastic radiation therapy: Secondary | ICD-10-CM | POA: Diagnosis not present

## 2018-02-12 NOTE — Progress Notes (Signed)
  Radiation Oncology         (336) [phone] ________________________________  Name: Ryan Wilkerson MRN: [id number]  Date: 02/12/2018  DOB: [birth year]  End of Treatment Note  Diagnosis:   Stage T1c adenocarcinoma of the prostate with Gleason Score of 4+3, and PSA of 8.48.     Indication for treatment:  Curative, Definitive Radiotherapy       Radiation treatment dates:   01/05/2018 - 02/12/2018  Site/dose:   The prostate was treated to 70 Gy in 28 fractions of 2.5 Gy  Beams/energy:   The patient was treated with IMRT using volumetric arc therapy delivering 6 MV X-rays to clockwise and counterclockwise circumferential arcs with a 90 degree collimator offset to avoid dose scalloping.  Image guidance was performed with daily cone beam CT prior to each fraction to align to gold markers in the prostate and assure proper bladder and rectal fill volumes.  Immobilization was achieved with BodyFix custom mold.  Narrative: The patient tolerated radiation treatment relatively well.  He experienced some minor urinary irritation and modest fatigue.  At the beginning of treatment, he experienced constipation but this resolved. Throughout treatment he reported nocturia, and by the end of treatment he endorsed mild dysuria and weakened stream. He consistently denied gross hematuria, leakage and incontinence.  Plan: The patient has completed radiation treatment. He will return to radiation oncology clinic for routine followup in one month. I advised him to call or return sooner if he has any questions or concerns related to his recovery or treatment. ________________________________  Sheral Apley. Tammi Klippel, M.D.  This document serves as a record of services personally performed by Tyler Pita, MD. It was created on his behalf by Margit Banda, a trained medical scribe. The creation of this record is based on the scribe's personal observations and the provider's statements to them. This document has been checked  and approved by the attending provider.

## 2018-03-02 DIAGNOSIS — C61 Malignant neoplasm of prostate: Secondary | ICD-10-CM | POA: Diagnosis not present

## 2018-03-09 DIAGNOSIS — C61 Malignant neoplasm of prostate: Secondary | ICD-10-CM | POA: Diagnosis not present

## 2018-03-09 DIAGNOSIS — R1033 Periumbilical pain: Secondary | ICD-10-CM | POA: Diagnosis not present

## 2018-03-09 DIAGNOSIS — R3915 Urgency of urination: Secondary | ICD-10-CM | POA: Diagnosis not present

## 2018-03-17 ENCOUNTER — Ambulatory Visit
Admission: RE | Admit: 2018-03-17 | Discharge: 2018-03-17 | Disposition: A | Discharge status: Home / Self Care | Payer: Medicare HMO | Source: Ambulatory Visit | Attending: Urology | Admitting: Urology

## 2018-03-17 ENCOUNTER — Encounter: Payer: Self-pay | Admitting: Urology

## 2018-03-17 VITALS — BP 133/73 | HR 56 | Temp 98.8°F | Resp 16 | Ht 68.0 in | Wt 163.0 lb

## 2018-03-17 DIAGNOSIS — Z79899 Other long term (current) drug therapy: Secondary | ICD-10-CM | POA: Diagnosis not present

## 2018-03-17 DIAGNOSIS — C61 Malignant neoplasm of prostate: Secondary | ICD-10-CM | POA: Diagnosis not present

## 2018-03-17 DIAGNOSIS — Z886 Allergy status to analgesic agent status: Secondary | ICD-10-CM | POA: Insufficient documentation

## 2018-03-17 DIAGNOSIS — R3912 Poor urinary stream: Secondary | ICD-10-CM | POA: Diagnosis not present

## 2018-03-17 NOTE — Progress Notes (Signed)
Radiation Oncology         (336) [phone] ________________________________  Name: Ryan Wilkerson MRN: [id number]  Date: 03/17/2018  DOB: [birth year]  Post Treatment Note  CC: Alroy Dust, L.Marlou Sa, MD  Alexis Frock, MD  Diagnosis:   Stage T1c adenocarcinoma of the prostate with Gleason Score of4+3, and PSA of8.48.     Interval Since Last Radiation:  4 weeks  01/05/2018 - 02/12/2018:  The prostate was treated to 70 Gy in 28 fractions of 2.5 Gy  Narrative:  The patient returns today for routine follow-up.  He tolerated radiation treatment relatively well.  He experienced some minor urinary irritation and modest fatigue.  At the beginning of treatment, he experienced constipation but this resolved. Throughout treatment he reported nocturia, and by the end of treatment he endorsed mild dysuria and weakened stream. He consistently denied gross hematuria, leakage and incontinence.  On review of systems, the patient states that he is doing very well overall.  He is continued to note a gradual improvement in his lower urinary tract symptoms with improving flow of stream, reduced sequencing and urgency and less intermittency.  His current IPSS score is 16 indicating moderate lower urinary symptoms.  He specifically denies dysuria, gross hematuria, excessive daytime frequency, suprapubic discomfort, incomplete bladder emptying or incontinence.  He continues with a weak stream but is not using Flomax as prescribed.  He denies abdominal pain, nausea, vomiting or diarrhea.  He reports a healthy appetite and is maintaining his weight.  Overall, he is pleased with his progress to date.  He was recently evaluated with Dr. Tresa Moore at [facility] urology and his repeat PSA at that time was around 6.  ALLERGIES:  is allergic to aspirin and simvastatin.  Meds: Current Outpatient Medications  Medication Sig Dispense Refill  . Calcium Carbonate Antacid (MAALOX PO) Take by mouth as needed.    Marland Kitchen  lisinopril-hydrochlorothiazide (PRINZIDE,ZESTORETIC) 20-12.5 MG tablet Take 1 tablet by mouth every morning.     . loratadine (CLARITIN) 10 MG tablet Take 10 mg by mouth every morning.     . pravastatin (PRAVACHOL) 40 MG tablet Take 40 mg by mouth every evening.     . propranolol (INDERAL) 10 MG tablet Take 10 mg by mouth every morning.     . traMADol (ULTRAM) 50 MG tablet Take 1-2 tablets (50-100 mg total) by mouth every 6 (six) hours as needed for moderate pain or severe pain. Post-operatively 15 tablet 0   No current facility-administered medications for this encounter.     Physical Findings:  height is 5\' 8"  (1.727 m) and weight is 163 lb (73.9 kg). His oral temperature is 98.8 F (37.1 C). His blood pressure is 133/73 and his pulse is 56 (abnormal). His respiration is 16 and oxygen saturation is 100%.  Pain Assessment Pain Score: 0-No pain/10 In general this is a well appearing African-American male in no acute distress. He's alert and oriented x4 and appropriate throughout the examination. Cardiopulmonary assessment is negative for acute distress and he exhibits normal effort.   Lab Findings: Lab Results  Component Value Date   HGB 13.6 12/23/2017   HCT 40.0 12/23/2017     Radiographic Findings: No results found.  Impression/Plan: 1. Stage T1c adenocarcinoma of the prostate with Gleason Score of4+3, and PSA of8.48.    He will continue to follow up with urology for ongoing PSA determinations and has an appointment scheduled with Dr. Tresa Moore in Jan. 2020.  I encouraged him to resume use of Flomax daily if  the week stream and intermittency continue to be bothersome.  Otherwise, he can remain off the medication and anticipate that his lower urinary tract symptoms will continue to improve gradually over the next several months.  He understands what to expect with regards to PSA monitoring going forward. I will look forward to following his response to treatment via correspondence with  urology, and would be happy to continue to participate in his care if clinically indicated. I talked to the patient about what to expect in the future, including his risk for erectile dysfunction and rectal bleeding. I encouraged him to call or return to the office if he has any questions regarding his previous radiation or possible radiation side effects. He was comfortable with this plan and will follow up as needed.    Nicholos Johns, PA-C

## 2018-04-19 DIAGNOSIS — G250 Essential tremor: Secondary | ICD-10-CM | POA: Diagnosis not present

## 2018-04-19 DIAGNOSIS — I10 Essential (primary) hypertension: Secondary | ICD-10-CM | POA: Diagnosis not present

## 2018-04-19 DIAGNOSIS — E7800 Pure hypercholesterolemia, unspecified: Secondary | ICD-10-CM | POA: Diagnosis not present

## 2018-04-19 DIAGNOSIS — J309 Allergic rhinitis, unspecified: Secondary | ICD-10-CM | POA: Diagnosis not present

## 2018-04-19 DIAGNOSIS — K219 Gastro-esophageal reflux disease without esophagitis: Secondary | ICD-10-CM | POA: Diagnosis not present

## 2018-06-25 ENCOUNTER — Ambulatory Visit
Admission: RE | Admit: 2018-06-25 | Discharge: 2018-06-25 | Disposition: A | Discharge status: Home / Self Care | Payer: Medicare HMO | Source: Ambulatory Visit | Attending: Family Medicine | Admitting: Family Medicine

## 2018-06-25 ENCOUNTER — Other Ambulatory Visit: Payer: Self-pay | Admitting: Family Medicine

## 2018-06-25 DIAGNOSIS — K5900 Constipation, unspecified: Secondary | ICD-10-CM | POA: Diagnosis not present

## 2018-06-25 DIAGNOSIS — R109 Unspecified abdominal pain: Secondary | ICD-10-CM | POA: Diagnosis not present

## 2018-06-25 DIAGNOSIS — R195 Other fecal abnormalities: Secondary | ICD-10-CM | POA: Diagnosis not present

## 2018-06-25 DIAGNOSIS — M545 Low back pain: Secondary | ICD-10-CM | POA: Diagnosis not present

## 2018-06-25 IMAGING — CR DG ABDOMEN 2V
2 series · 2 of 2 positions shown · non-contrast
Comparison: None.

CLINICAL DATA: Abdominal pain, chronic constipation.

EXAM:
ABDOMEN - 2 VIEW

[w abdomen upright *]
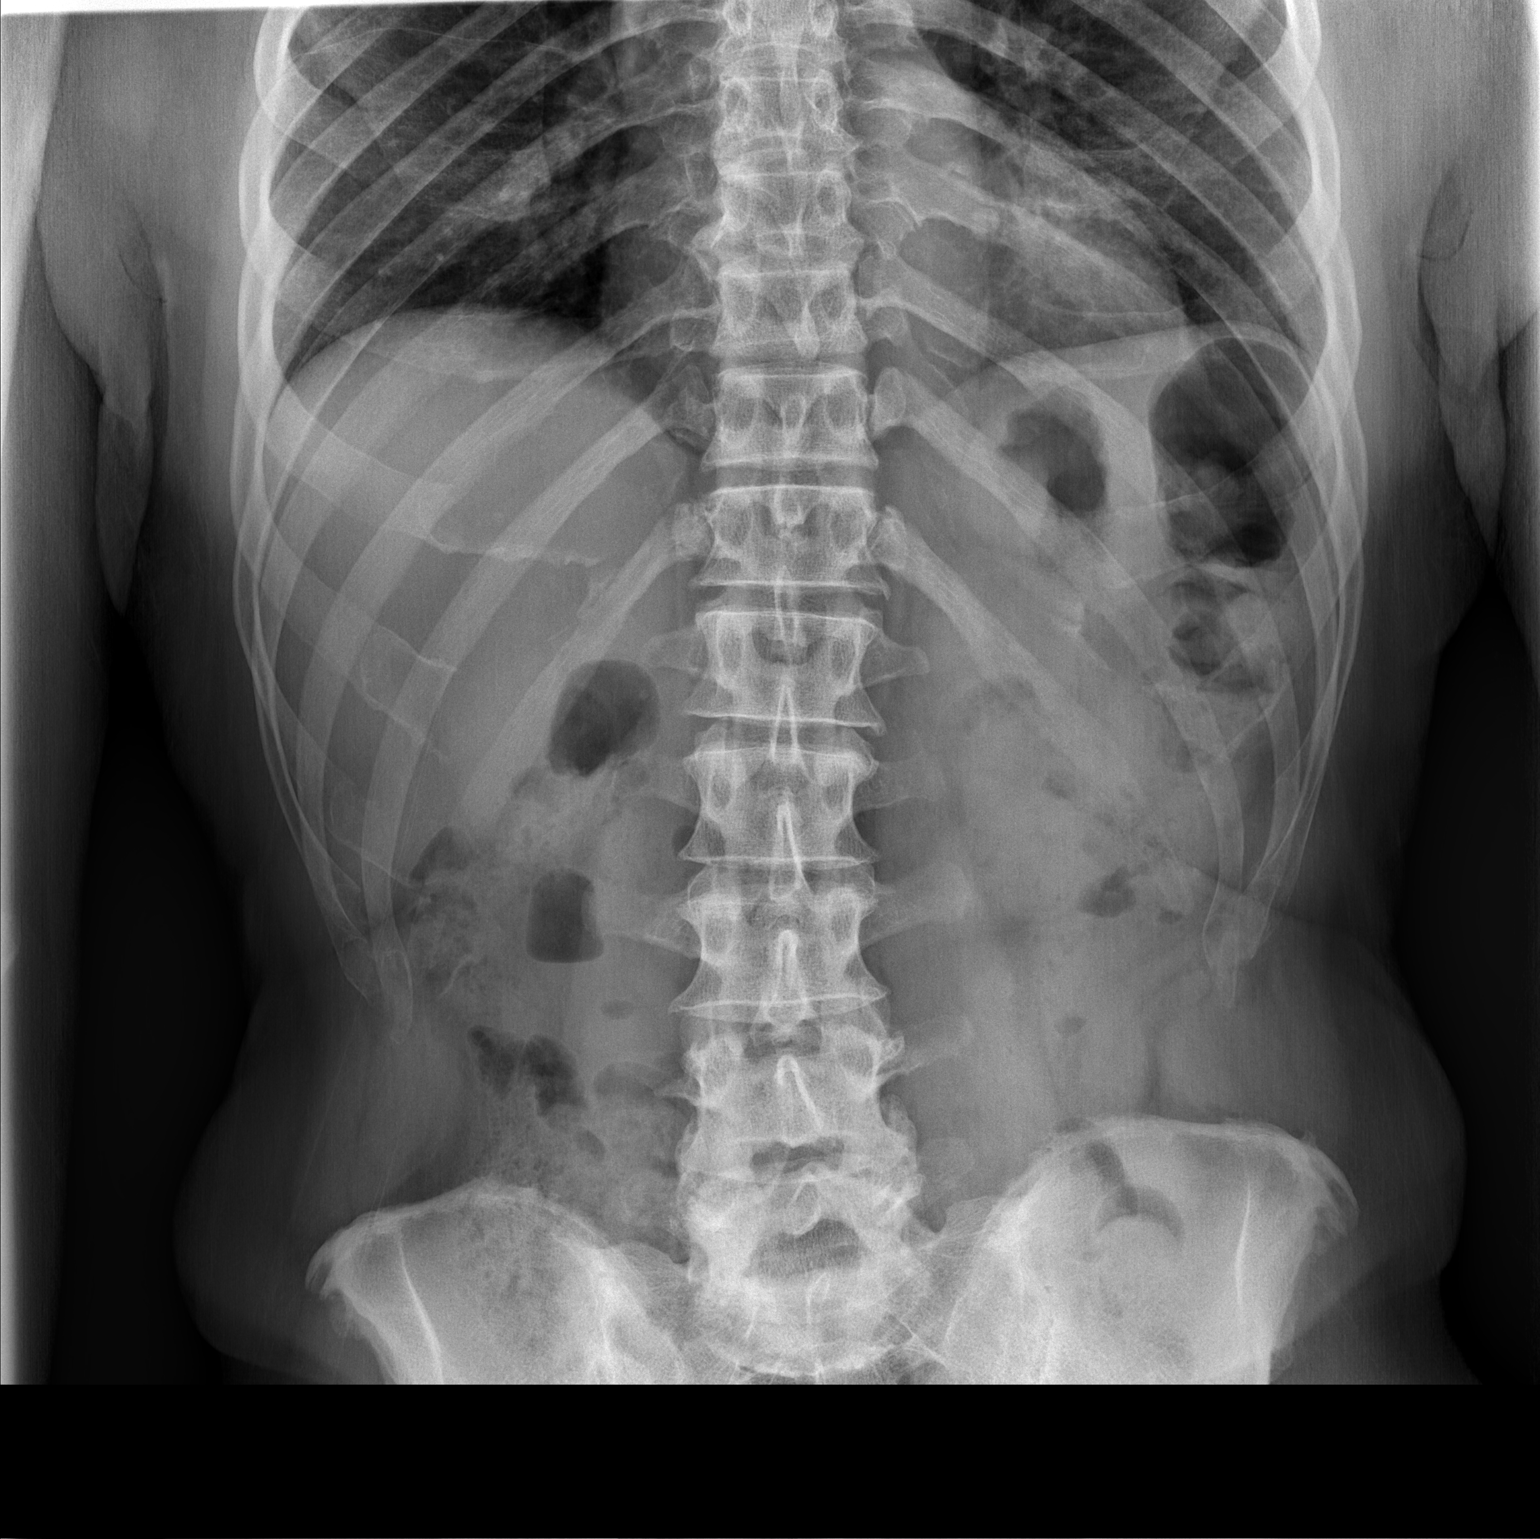

[t abdomen supine]
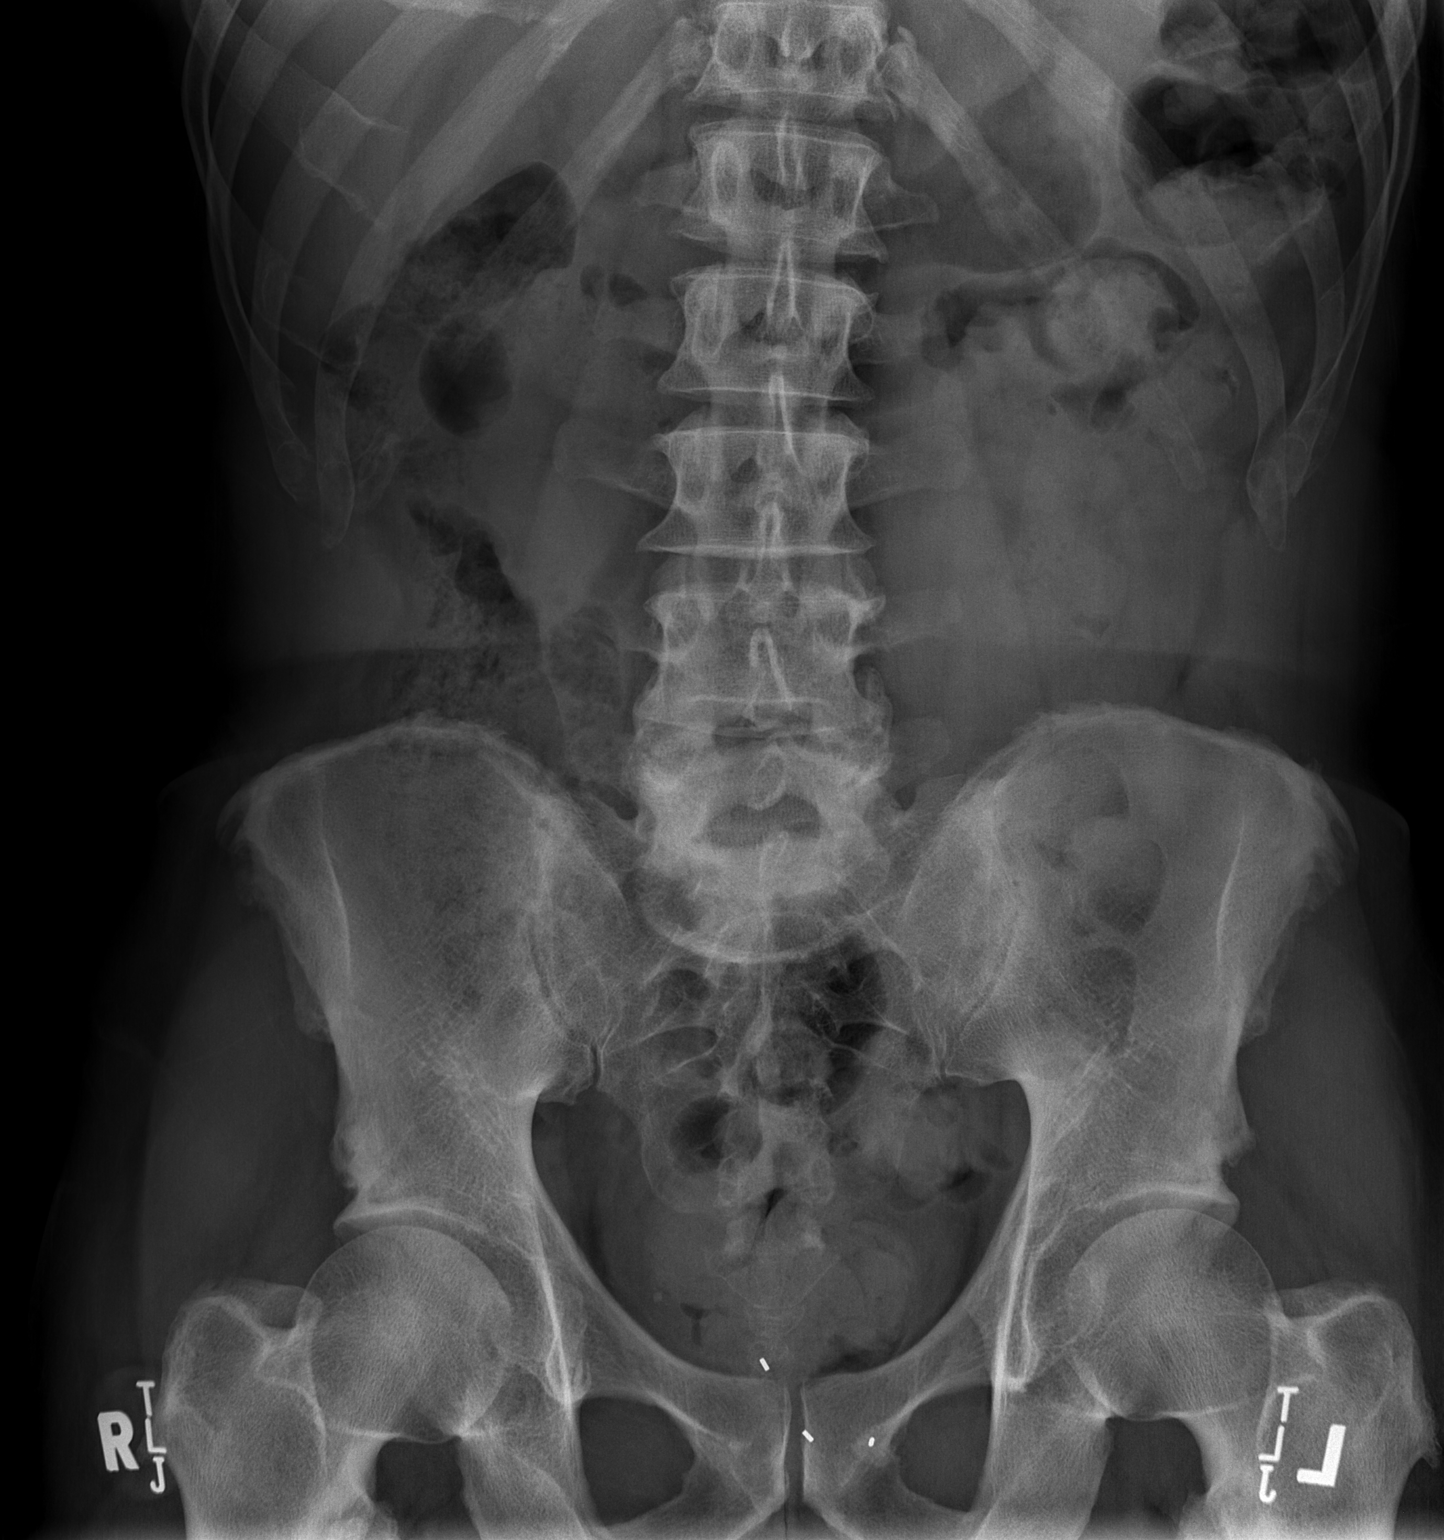

[2 of 2 positions shown; findings below may reference images not displayed]

FINDINGS: Moderate stool burden throughout the colon. There is normal bowel
gas pattern. No free air. No organomegaly or suspicious
calcification. No acute bony abnormality.
IMPRESSION: Moderate stool burden.  No acute findings.

## 2018-07-07 DIAGNOSIS — H903 Sensorineural hearing loss, bilateral: Secondary | ICD-10-CM | POA: Diagnosis not present

## 2018-08-10 DIAGNOSIS — C61 Malignant neoplasm of prostate: Secondary | ICD-10-CM | POA: Diagnosis not present

## 2018-08-16 DIAGNOSIS — C61 Malignant neoplasm of prostate: Secondary | ICD-10-CM | POA: Diagnosis not present

## 2018-08-16 DIAGNOSIS — R3915 Urgency of urination: Secondary | ICD-10-CM | POA: Diagnosis not present

## 2018-10-21 DIAGNOSIS — R5383 Other fatigue: Secondary | ICD-10-CM | POA: Diagnosis not present

## 2018-10-21 DIAGNOSIS — I10 Essential (primary) hypertension: Secondary | ICD-10-CM | POA: Diagnosis not present

## 2018-10-21 DIAGNOSIS — E7800 Pure hypercholesterolemia, unspecified: Secondary | ICD-10-CM | POA: Diagnosis not present

## 2018-10-21 DIAGNOSIS — G250 Essential tremor: Secondary | ICD-10-CM | POA: Diagnosis not present

## 2018-10-21 DIAGNOSIS — J309 Allergic rhinitis, unspecified: Secondary | ICD-10-CM | POA: Diagnosis not present

## 2018-10-21 DIAGNOSIS — K219 Gastro-esophageal reflux disease without esophagitis: Secondary | ICD-10-CM | POA: Diagnosis not present

## 2018-10-21 DIAGNOSIS — Z0000 Encounter for general adult medical examination without abnormal findings: Secondary | ICD-10-CM | POA: Diagnosis not present

## 2019-02-07 DIAGNOSIS — C61 Malignant neoplasm of prostate: Secondary | ICD-10-CM | POA: Diagnosis not present

## 2019-02-14 DIAGNOSIS — R3915 Urgency of urination: Secondary | ICD-10-CM | POA: Diagnosis not present

## 2019-02-14 DIAGNOSIS — C61 Malignant neoplasm of prostate: Secondary | ICD-10-CM | POA: Diagnosis not present

## 2019-02-14 DIAGNOSIS — R1033 Periumbilical pain: Secondary | ICD-10-CM | POA: Diagnosis not present

## 2019-03-18 ENCOUNTER — Emergency Department (HOSPITAL_COMMUNITY): Payer: No Typology Code available for payment source

## 2019-03-18 ENCOUNTER — Emergency Department (HOSPITAL_COMMUNITY)
Admission: EM | Admit: 2019-03-18 | Discharge: 2019-03-18 | Disposition: A | Discharge status: Home / Self Care | Payer: No Typology Code available for payment source | Attending: Emergency Medicine | Admitting: Emergency Medicine

## 2019-03-18 DIAGNOSIS — Z79899 Other long term (current) drug therapy: Secondary | ICD-10-CM | POA: Insufficient documentation

## 2019-03-18 DIAGNOSIS — Z87891 Personal history of nicotine dependence: Secondary | ICD-10-CM | POA: Insufficient documentation

## 2019-03-18 DIAGNOSIS — R42 Dizziness and giddiness: Secondary | ICD-10-CM | POA: Insufficient documentation

## 2019-03-18 DIAGNOSIS — Z8546 Personal history of malignant neoplasm of prostate: Secondary | ICD-10-CM | POA: Insufficient documentation

## 2019-03-18 DIAGNOSIS — I10 Essential (primary) hypertension: Secondary | ICD-10-CM | POA: Insufficient documentation

## 2019-03-18 DIAGNOSIS — J0100 Acute maxillary sinusitis, unspecified: Secondary | ICD-10-CM | POA: Insufficient documentation

## 2019-03-18 LAB — DIFFERENTIAL
Abs Immature Granulocytes: 0.04 10*3/uL (ref 0.00–0.07)
Basophils Absolute: 0 10*3/uL (ref 0.0–0.1)
Basophils Relative: 0 %
Eosinophils Absolute: 0.1 10*3/uL (ref 0.0–0.5)
Eosinophils Relative: 1 %
Immature Granulocytes: 0 %
Lymphocytes Relative: 17 %
Lymphs Abs: 1.5 10*3/uL (ref 0.7–4.0)
Monocytes Absolute: 0.7 10*3/uL (ref 0.1–1.0)
Monocytes Relative: 8 %
Neutro Abs: 6.7 10*3/uL (ref 1.7–7.7)
Neutrophils Relative %: 74 %

## 2019-03-18 LAB — TROPONIN I (HIGH SENSITIVITY): Troponin I (High Sensitivity): 16 ng/L (ref <18)

## 2019-03-18 LAB — I-STAT CHEM 8, ED
BUN: 11 mg/dL (ref 8–23)
Calcium, Ion: 1.22 mmol/L (ref 1.15–1.40)
Chloride: 102 mmol/L (ref 98–111)
Creatinine, Ser: 1.2 mg/dL (ref 0.61–1.24)
Glucose, Bld: 105 mg/dL — ABNORMAL HIGH (ref 70–99)
HCT: 43 % (ref 39.0–52.0)
Hemoglobin: 14.6 g/dL (ref 13.0–17.0)
Potassium: 3.9 mmol/L (ref 3.5–5.1)
Sodium: 139 mmol/L (ref 135–145)
TCO2: 28 mmol/L (ref 22–32)

## 2019-03-18 LAB — CBG MONITORING, ED: Glucose-Capillary: 86 mg/dL (ref 70–99)

## 2019-03-18 LAB — CBC
HCT: 41.2 % (ref 39.0–52.0)
Hemoglobin: 13.1 g/dL (ref 13.0–17.0)
MCH: 29.9 pg (ref 26.0–34.0)
MCHC: 31.8 g/dL (ref 30.0–36.0)
MCV: 94.1 fL (ref 80.0–100.0)
Platelets: 219 10*3/uL (ref 150–400)
RBC: 4.38 MIL/uL (ref 4.22–5.81)
RDW: 11.7 % (ref 11.5–15.5)
WBC: 9.1 10*3/uL (ref 4.0–10.5)
nRBC: 0 % (ref 0.0–0.2)

## 2019-03-18 LAB — APTT: aPTT: 27 seconds (ref 24–36)

## 2019-03-18 LAB — COMPREHENSIVE METABOLIC PANEL
ALT: 23 U/L (ref 0–44)
AST: 26 U/L (ref 15–41)
Albumin: 3.7 g/dL (ref 3.5–5.0)
Alkaline Phosphatase: 51 U/L (ref 38–126)
Anion gap: 8 (ref 5–15)
BUN: 10 mg/dL (ref 8–23)
CO2: 27 mmol/L (ref 22–32)
Calcium: 9.8 mg/dL (ref 8.9–10.3)
Chloride: 104 mmol/L (ref 98–111)
Creatinine, Ser: 1.19 mg/dL (ref 0.61–1.24)
GFR calc Af Amer: >60 mL/min (ref >60)
GFR calc non Af Amer: >60 mL/min (ref >60)
Glucose, Bld: 107 mg/dL — ABNORMAL HIGH (ref 70–99)
Potassium: 3.9 mmol/L (ref 3.5–5.1)
Sodium: 139 mmol/L (ref 135–145)
Total Bilirubin: 0.6 mg/dL (ref 0.3–1.2)
Total Protein: 7.1 g/dL (ref 6.5–8.1)

## 2019-03-18 LAB — PROTIME-INR
INR: 1.1 (ref 0.8–1.2)
Prothrombin Time: 14 seconds (ref 11.4–15.2)

## 2019-03-18 IMAGING — MR MRI HEAD WITHOUT CONTRAST
10 of 11 series · 43 of 48 positions shown · non-contrast
Comparison: Head CT [DATE]

CLINICAL DATA: Dizziness for 2 days.

EXAM:
MRI HEAD WITHOUT CONTRAST
TECHNIQUE: Multiplanar, multiecho pulse sequences of the brain and surrounding
structures were obtained without intravenous contrast.

[Series 5: DWI · axial · 3.0mm · 0.88mm/px · z∈[-112,+23]mm · 11 of 100 slices shown (1 of 4)]
[im 1/100]
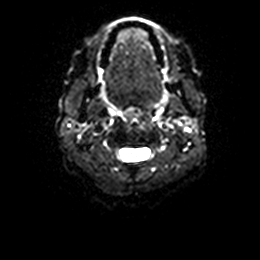
[im 10/100]
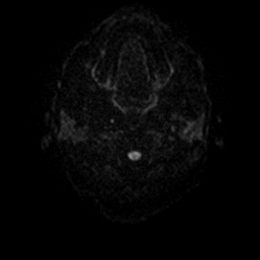
[im 20/100]
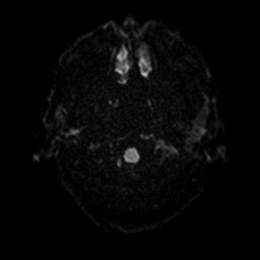
[im 30/100]
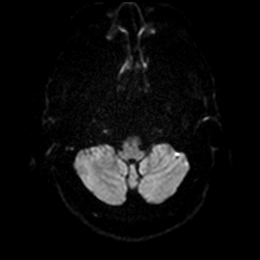
[im 40/100]
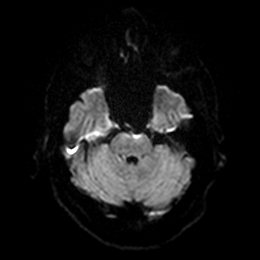
[im 50/100]
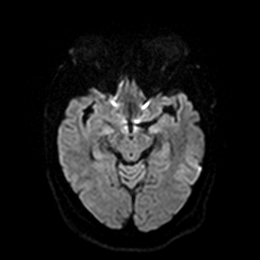
[im 60/100]
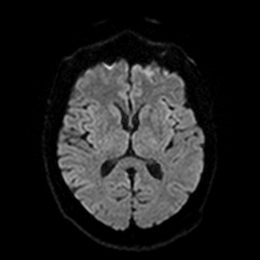
[im 70/100]
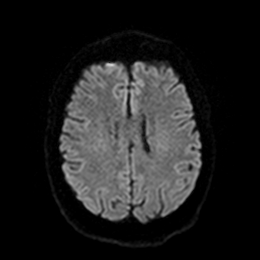
[im 80/100]
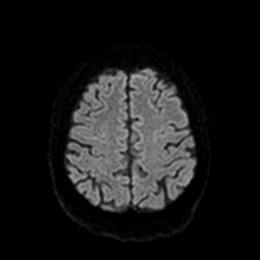
[im 90/100]
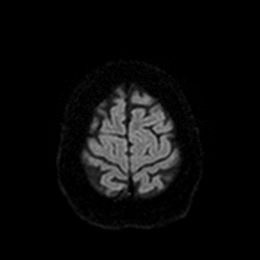
[im 100/100]
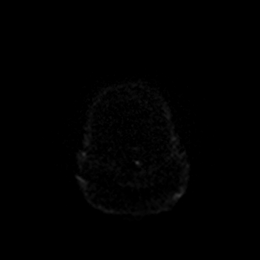

[Series 6: DWI · axial · 3.0mm · 0.88mm/px · z∈[-112,+23]mm · 5 of 50 slices shown (2 of 4)]
[im 1/50]
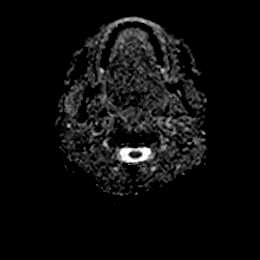
[im 13/50]
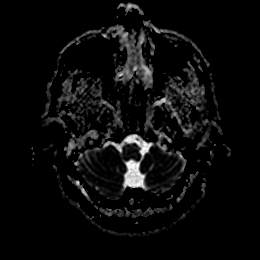
[im 25/50]
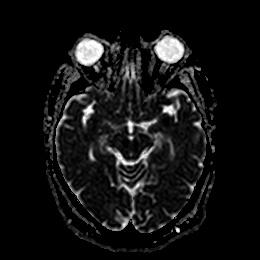
[im 37/50]
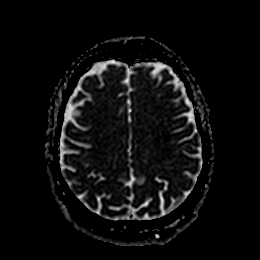
[im 50/50]
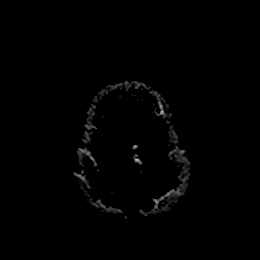

[Series 7: FLAIR · axial · 5.0mm · 0.45mm/px · z∈[-113,+19]mm · 2 of 25 slices shown]
[im 1/25]
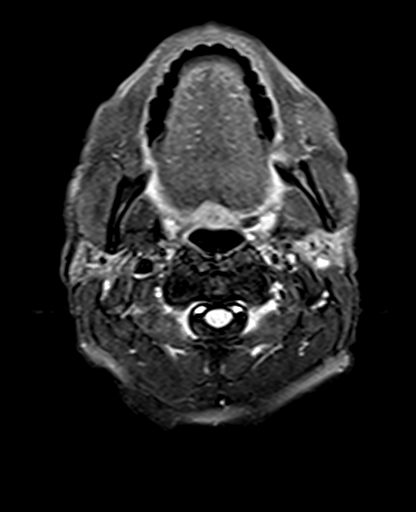
[im 25/25]
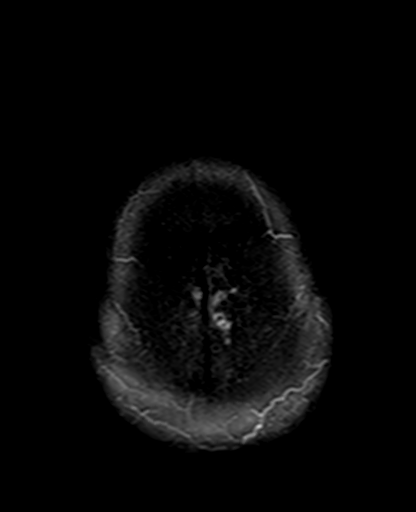

[Series 9: pha_images · axial · 3.0mm · 0.90mm/px · z∈[-117,+23]mm · 5 of 52 slices shown]
[im 1/52]
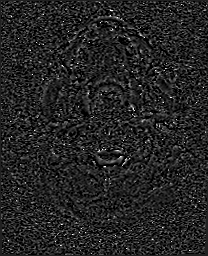
[im 13/52]
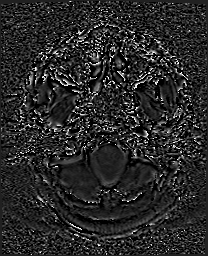
[im 26/52]
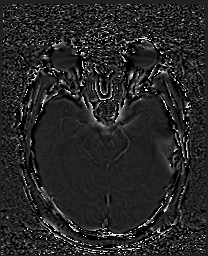
[im 39/52]
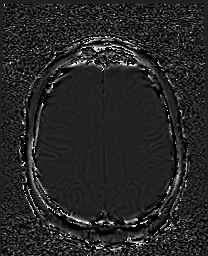
[im 52/52]
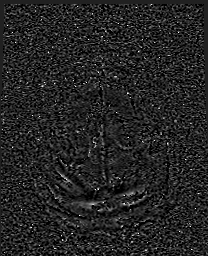

[Series 10: swi_images · axial · 3.0mm · 0.90mm/px · z∈[-117,+23]mm · 5 of 52 slices shown]
[im 1/52]
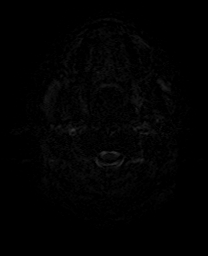
[im 13/52]
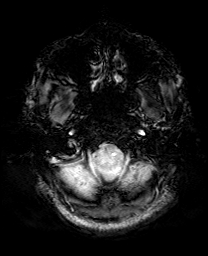
[im 26/52]
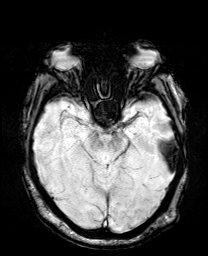
[im 39/52]
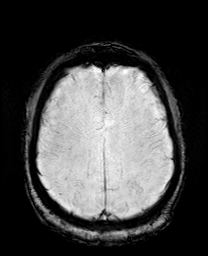
[im 52/52]
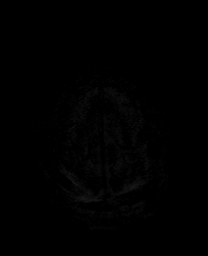

[Series 12: T2 · axial · 5.0mm · 0.72mm/px · z∈[-112,+20]mm · 2 of 25 slices shown (1 of 2)]
[im 1/25]
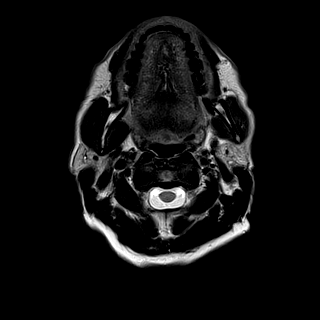
[im 25/25]
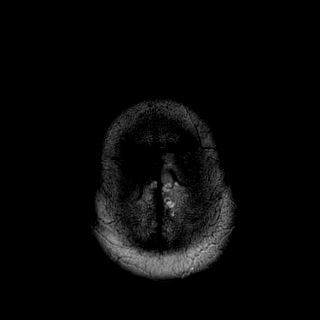

[Series 13: DWI · coronal · 4.0mm · 0.88mm/px · 6 of 64 slices shown (3 of 4)]
[im 1/64]
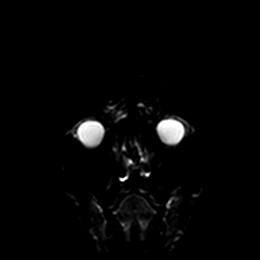
[im 13/64]
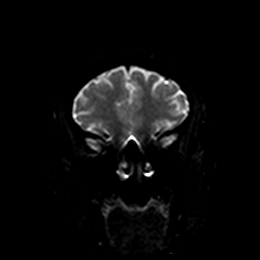
[im 26/64]
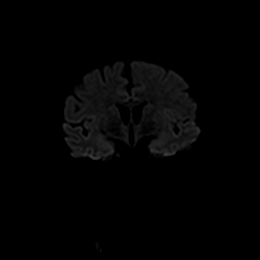
[im 38/64]
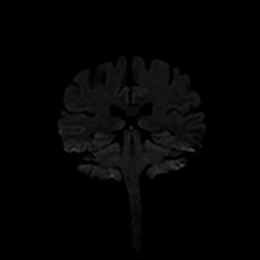
[im 51/64]
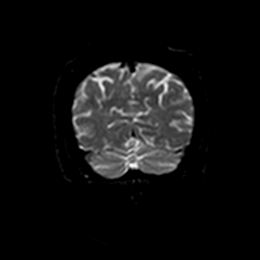
[im 64/64]
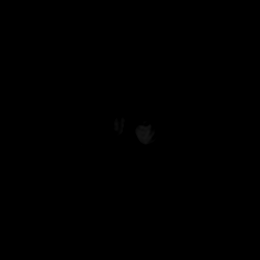

[Series 14: DWI · coronal · 4.0mm · 0.88mm/px · 3 of 32 slices shown (4 of 4)]
[im 1/32]
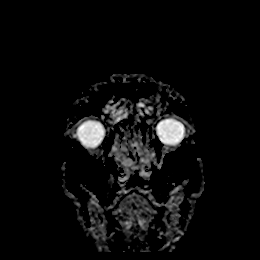
[im 16/32]
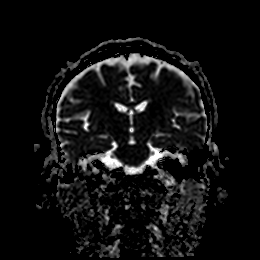
[im 32/32]
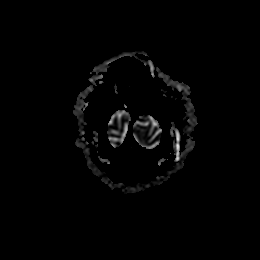

[Series 15: T1 · sagittal · 5.0mm · 0.75mm/px · 2 of 23 slices shown]
[im 1/23]
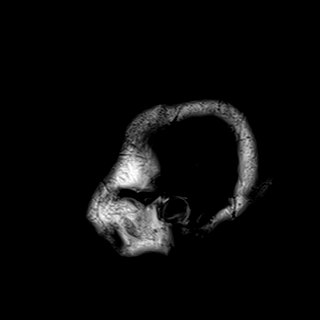
[im 23/23]
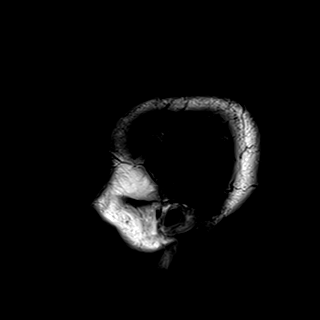

[Series 17: T2 · coronal · 5.0mm · 0.34mm/px · 2 of 26 slices shown (2 of 2)]
[im 1/26]
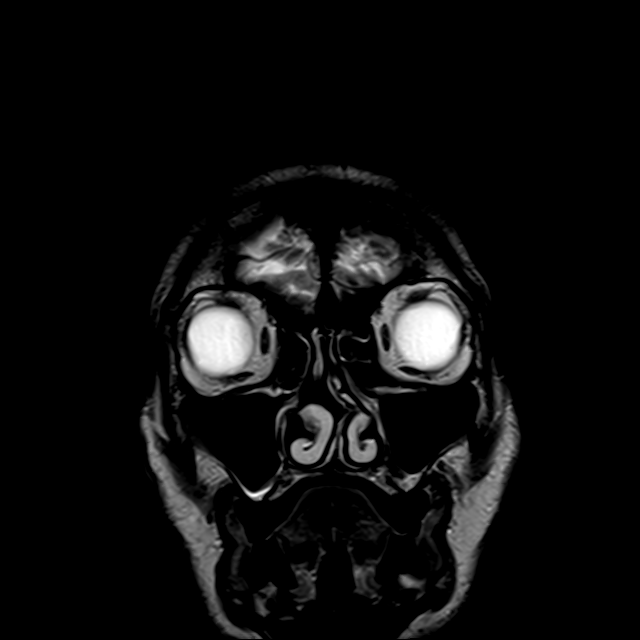
[im 26/26]
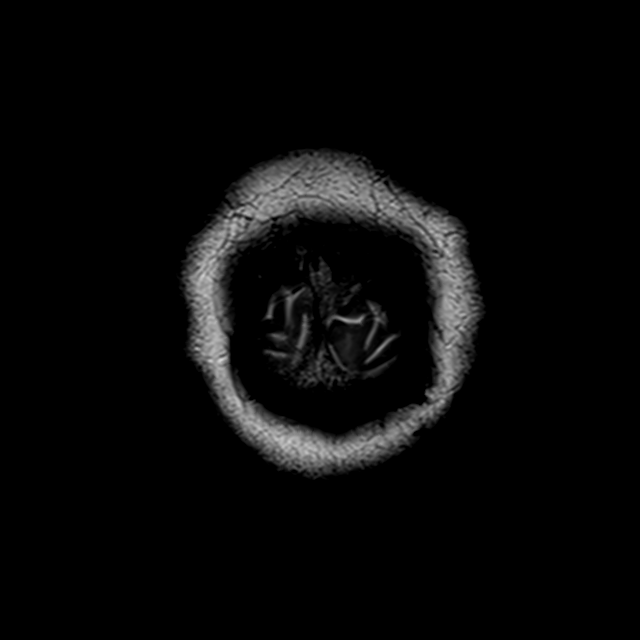

[43 of 48 positions shown; findings below may reference images not displayed]

FINDINGS: Brain: There is no evidence of acute infarct, intracranial
hemorrhage, mass, midline shift, or extra-axial fluid collection.
The ventricles and sulci are normal. The brain is normal in signal.
There is a nonexpanded partially empty sella, likely incidental in
this setting.

Vascular: The distal right vertebral artery is small and poorly
visualized, likely congenitally hypoplastic. Other major
intracranial vascular flow voids are preserved.

Skull and upper cervical spine: Unremarkable bone marrow signal.

Sinuses/Orbits: Unremarkable orbits. Right mastoid effusion. No
acute inflammatory changes in the paranasal sinuses.

Other: None.
IMPRESSION: 1. No acute or significant intracranial findings.
2. Right mastoid effusion.

## 2019-03-18 IMAGING — CT CT HEAD WITHOUT CONTRAST
3 series · 15 of 47 positions shown, 18 images · non-contrast
Comparison: None.

CLINICAL DATA: Altered level of consciousness and dizziness

EXAM:
CT HEAD WITHOUT CONTRAST
TECHNIQUE: Contiguous axial images were obtained from the base of the skull
through the vertex without intravenous contrast.

[Series 3: head 5.0 h30s · axial · 0.43mm/px · z∈[-191,-51]mm · 9 of 34 slices shown, 12 images]
[im 3/34  brain]
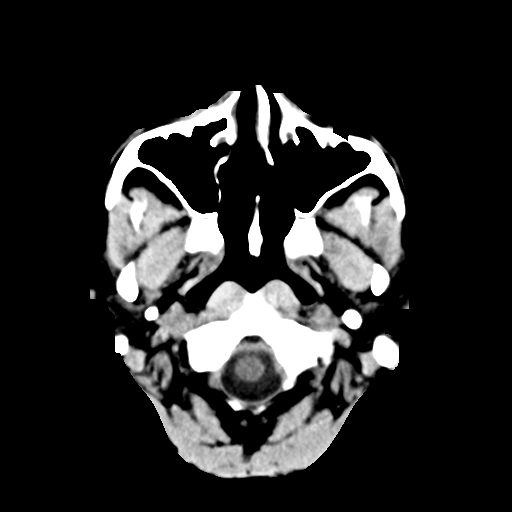
[im 3/34  bone]
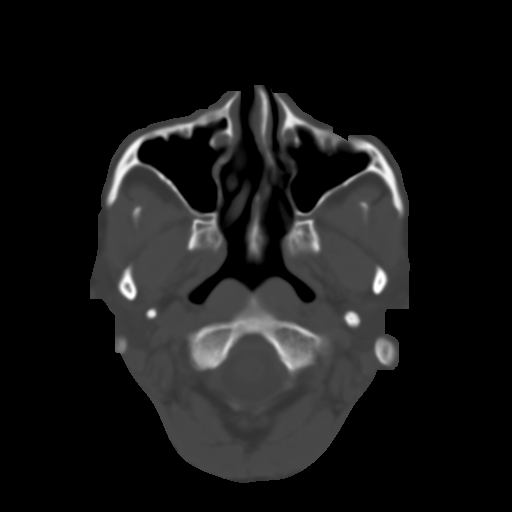
[im 6/34  brain]
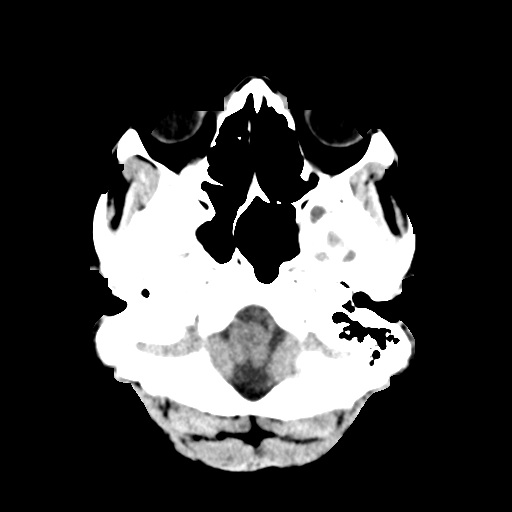
[im 10/34  brain]
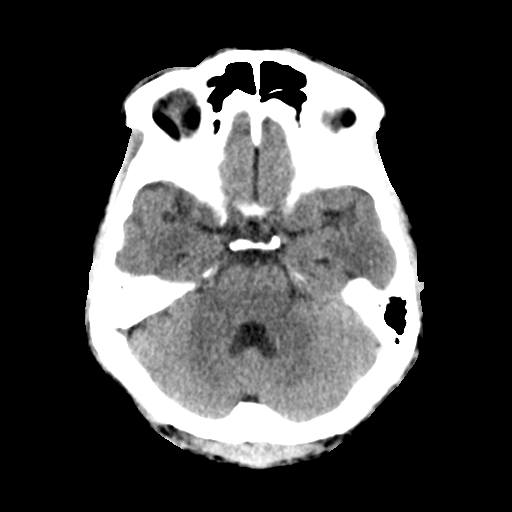
[im 13/34  brain]
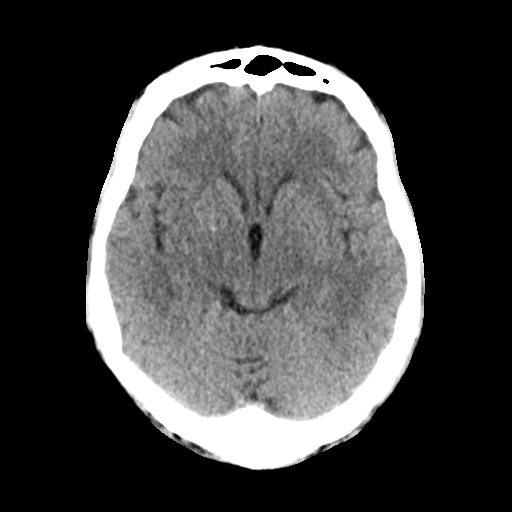
[im 18/34  brain]
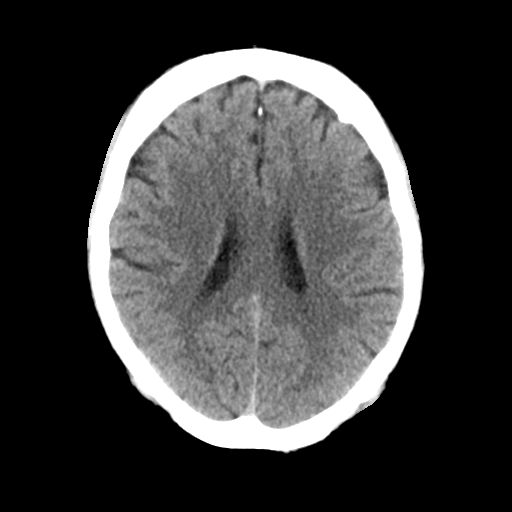
[im 18/34  bone]
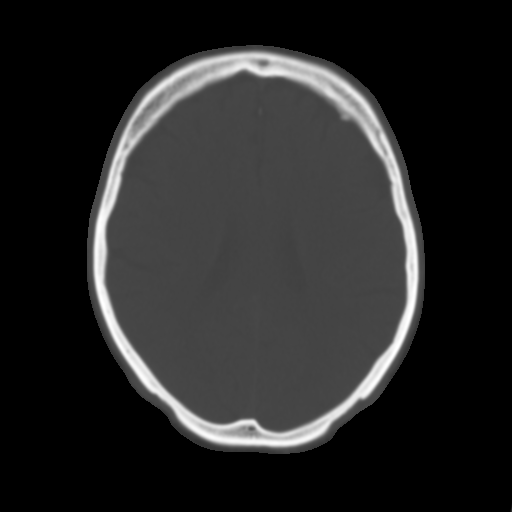
[im 21/34  brain]
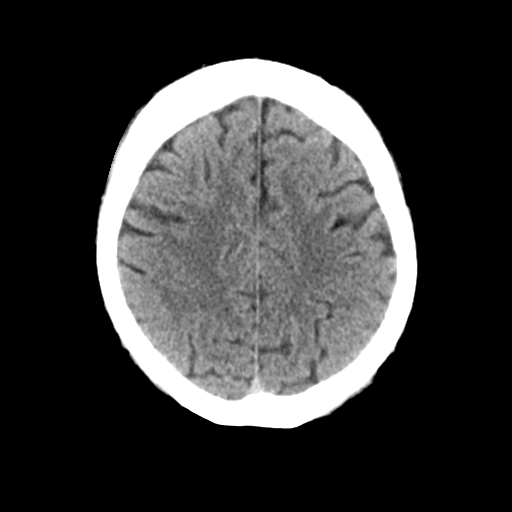
[im 24/34  brain]
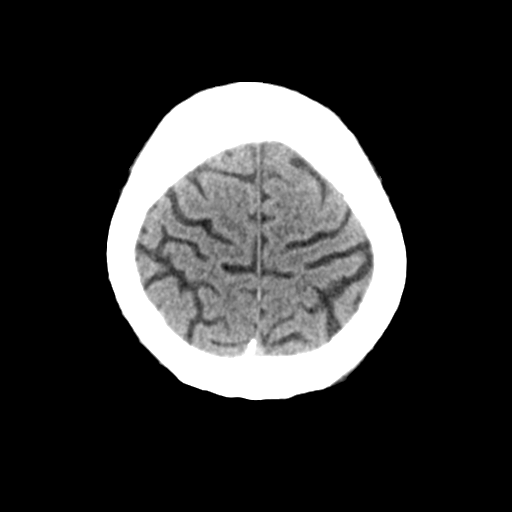
[im 28/34  brain]
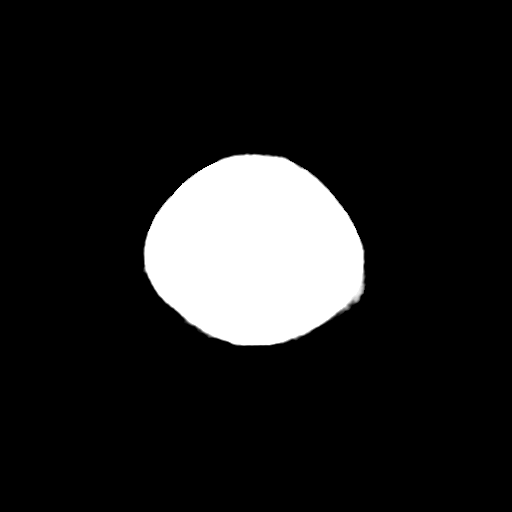
[im 31/34  brain]
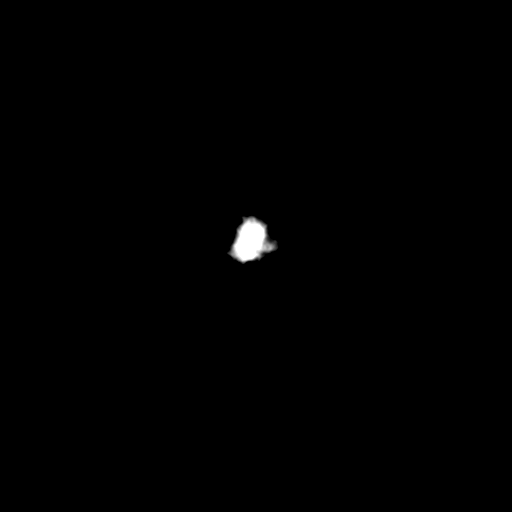
[im 31/34  bone]
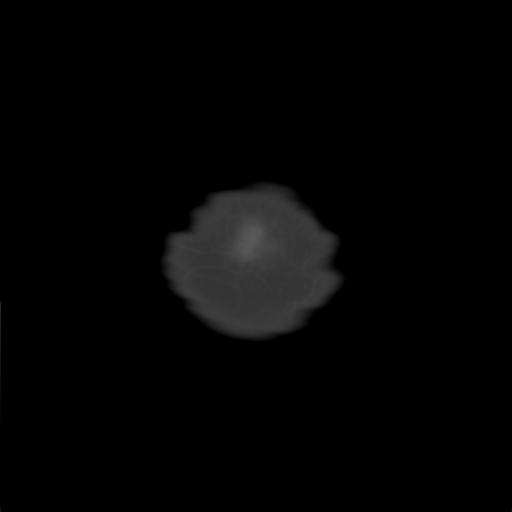

[Series 5: head 3.0 mpr cor · coronal · 0.32mm/px · 3 of 73 slices shown]
[im 26/73  brain]
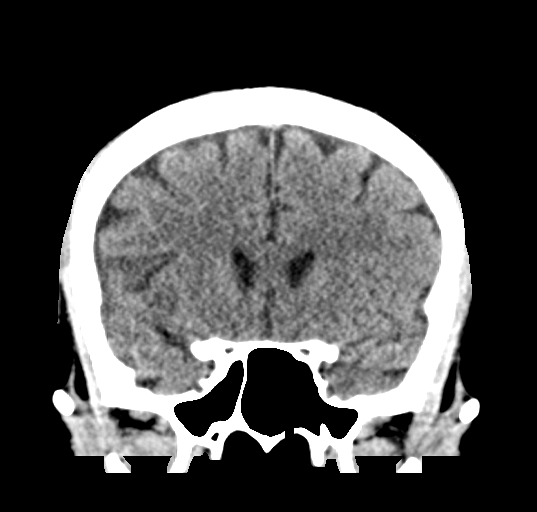
[im 33/73  brain]
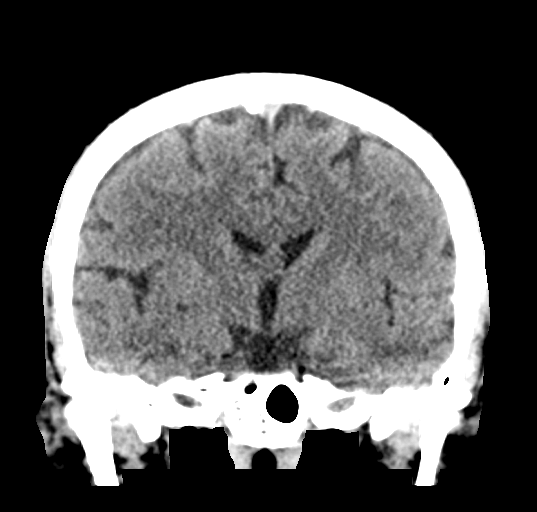
[im 40/73  brain]
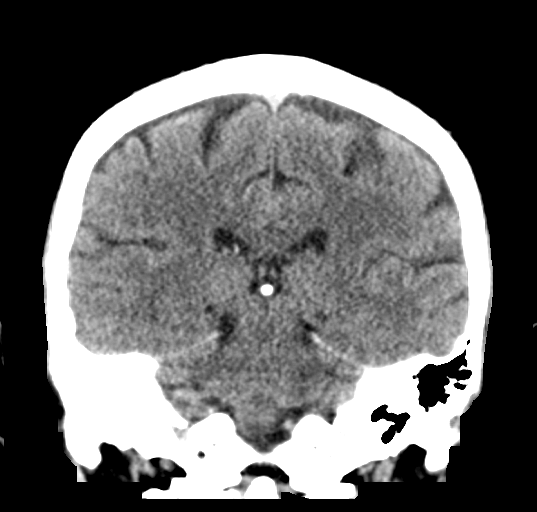

[Series 6: head 3.0 mpr sag · sagittal · 0.32mm/px · 3 of 65 slices shown]
[im 22/65  brain]
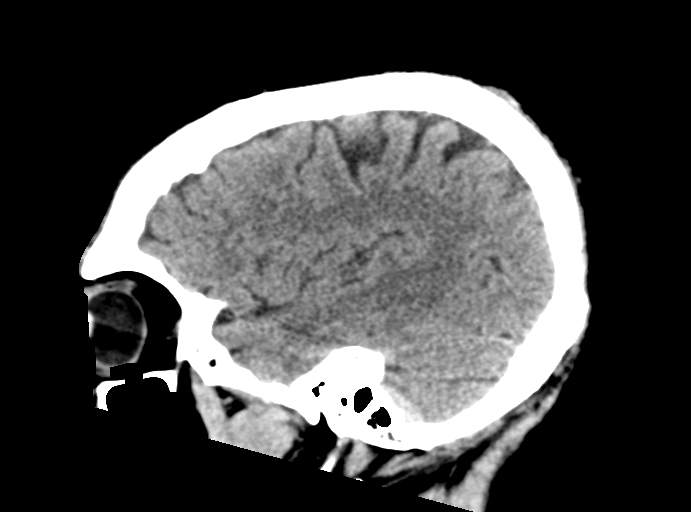
[im 33/65  brain]
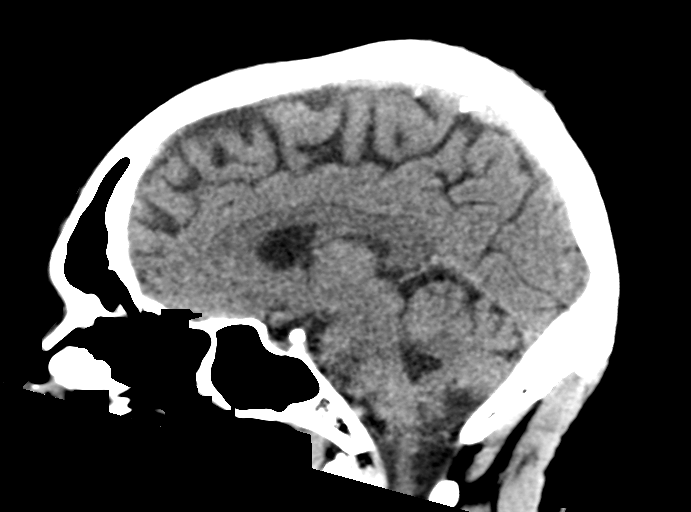
[im 43/65  brain]
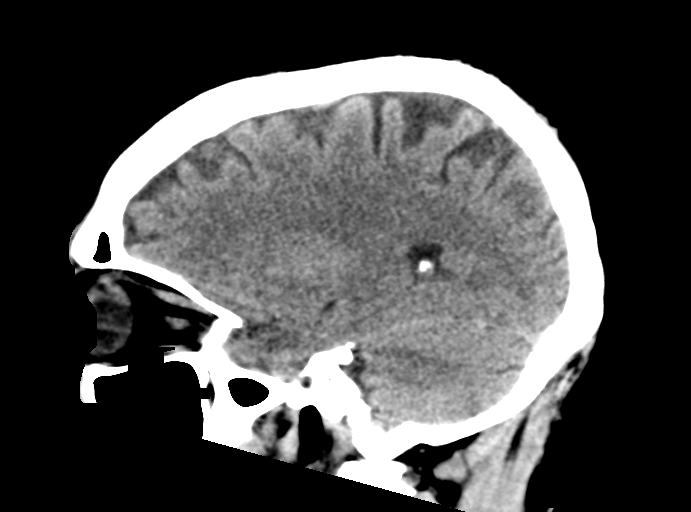

[15 of 47 positions shown; findings below may reference images not displayed]

FINDINGS: Brain: The ventricles are normal in size and configuration. There is
no intracranial mass, hemorrhage, extra-axial fluid collection, or
midline shift. The brain parenchyma appears unremarkable. No acute
infarct evident.

Vascular: No hyperdense vessel.  No evident vascular calcification.

Skull: Bony calvarium appears intact.

Sinuses/Orbits: There is mucosal thickening in several ethmoid air
cells. Other visualized paranasal sinuses are clear. There is
leftward deviation of the nasal septum. Orbits appear symmetric
bilaterally.

Other: Mastoid air cells on the left are clear. Mastoids on the
right are hypoplastic.
IMPRESSION: Brain parenchyma appears unremarkable. No acute infarct evident. No
mass or hemorrhage.

There is mucosal thickening in several ethmoid air cells.

## 2019-03-18 MED ORDER — SODIUM CHLORIDE 0.9% FLUSH: 3.0000 mL | Freq: Once | INTRAVENOUS | Status: DC

## 2019-03-18 MED ORDER — FLUTICASONE PROPIONATE 50 MCG/ACT NA SUSP: 2.0000 | Freq: Every day | NASAL | 0 refills | Status: AC

## 2019-03-18 MED ORDER — AMOXICILLIN 500 MG PO CAPS: 500.0000 mg | ORAL_CAPSULE | Freq: Three times a day (TID) | ORAL | 0 refills | Status: AC

## 2019-03-18 NOTE — Discharge Instructions (Signed)
Your work-up today showed no findings of stroke but did show some findings of sinusitis.  Please take all of your antibiotics until finished!   You may develop abdominal discomfort or diarrhea from the antibiotic.  You may help offset this with probiotics which you can buy or get in yogurt. Do not eat  or take the probiotics until 2 hours after your antibiotic.   Use the Flonase nasal spray for congestion.  Follow-up with your primary care provider for reevaluation of your symptoms.  Return to the emergency department if any concerning signs or symptoms develop such as weakness to one side of the body, persistent dizziness, difficulty walking, slurred speech, facial droop, chest pain, shortness of breath.

## 2019-03-18 NOTE — ED Triage Notes (Signed)
C/o dizziness that started 2 days ago, [facility] PRESCRIBED MECLIZINE PER PRIVATE md-- still dizzy-- has been having black out spells for "awhile" --

## 2019-03-18 NOTE — ED Provider Notes (Signed)
[place] EMERGENCY DEPARTMENT Provider Note   CSN: [id number] Arrival date & time: 03/18/19  1312    History   Chief Complaint Chief Complaint  Patient presents with   Dizziness    HPI Ryan Wilkerson is a 73 y.o. male with history of GERD, HLD, HTN, essential tremor, prostate cancer presents for evaluation of acute onset, progressively worsening dizziness for 3 days.  He reports that on Wednesday at around 11 AM while at rest he began to feel a sensation of disequilibrium that worsened with attempts to ambulate.  He reports that the sensation lasted for the rest of the day and then when he awoke on Thursday morning he was asymptomatic for several hours.  He states that around noon on Thursday (yesterday), he had a recurrence of his symptoms and this has been persistent since then.  He states he has significant difficulty ambulating feeling as though he is unsteady and that he is being "pulled to the right ".  Denies room spinning sensation or lightheadedness.  No head injury or loss of consciousness.  He denies tinnitus, vision changes, headache, numbness, or weakness.  Reports feeling as though his heart is pounding but denies significant chest pain or shortness of breath.  No abdominal pain, nausea, or vomiting.  He called his PCP who prescribed meclizine.  He states he has had 4 doses of this so far with no improvement in his symptoms.  He is a non-smoker, denies recreational drug use or alcohol intake.     The history is provided by the patient.    Past Medical History:  Diagnosis Date   Erectile dysfunction    Essential tremor    Family history of breast cancer    Family history of prostate cancer    Family history of stomach cancer    GERD (gastroesophageal reflux disease)    History of colon polyps    Hypercholesterolemia    Hypertension    Nocturia    Prostate cancer [facility]) urologist-- dr Tresa Moore /  oncologist-- dr manning   prostate bx  06-24-2016 and 09-15-2017 at dr Tresa Moore office  Stage T1c, Gleason 4+3, PSA 8.48, vol 33cc---  planned external beam radiation therpay   Urgency of urination    Wears glasses     Patient Active Problem List   Diagnosis Date Noted   Genetic testing 11/13/2017   Family history of prostate cancer    Family history of breast cancer    Family history of stomach cancer    Malignant neoplasm of prostate ([place]) 08/31/2017    Past Surgical History:  Procedure Laterality Date   COLONOSCOPY  last one 06/ 2018   GOLD SEED IMPLANT N/A 12/23/2017   Procedure: GOLD SEED IMPLANT;  Surgeon: Alexis Frock, MD;  Location: [facility];  Service: Urology;  Laterality: N/A;   PROSTATE BIOPSY  06-24-2016;  09-15-2017-- at dr Tresa Moore office   [place] N/A 12/23/2017   Procedure: [place];  Surgeon: Alexis Frock, MD;  Location: [facility];  Service: Urology;  Laterality: N/A;        Home Medications    Prior to Admission medications   Medication Sig Start Date End Date Taking? Authorizing Provider  acetaminophen (TYLENOL) 500 MG tablet Take 1,000 mg by mouth every 6 (six) hours as needed for headache (pain).   Yes [provider]  lisinopril-hydrochlorothiazide (PRINZIDE,ZESTORETIC) 20-12.5 MG tablet Take 1 tablet by mouth every morning.    Yes [provider]  loratadine (CLARITIN) 10 MG tablet Take 10 mg by mouth every morning.    Yes [provider]  meclizine (ANTIVERT) 25 MG tablet Take 25 mg by mouth 4 (four) times daily as needed for dizziness.  03/17/19  Yes [provider]  Melatonin 5 MG TABS Take 5 mg by mouth at bedtime.   Yes [provider]  OVER THE COUNTER MEDICATION Place 1 drop into both eyes daily as needed (dry eyes). Over the counter lubricating eye drop   Yes [provider]  pravastatin (PRAVACHOL) 40 MG tablet Take 40 mg by mouth every morning.    Yes [provider]  tamsulosin (FLOMAX) 0.4 MG CAPS capsule Take 0.4 mg by mouth at bedtime. 12/29/18  Yes [provider]  amoxicillin (AMOXIL) 500 MG capsule Take 1 capsule (500 mg total) by mouth 3 (three) times daily for 7 days. 03/18/19 03/25/19  Rodell Perna A, PA-C  fluticasone (FLONASE) 50 MCG/ACT nasal spray Place 2 sprays into both nostrils daily. 03/18/19   Renita Papa, PA-C    Family History Family History  Problem Relation Age of Onset   Prostate cancer Brother 38   Prostate cancer Brother        metastatic/ late treatment dx in 60's/70's   Breast cancer Sister 40   Prostate cancer Brother        dx in 60's/70's   Prostate cancer Brother        dx 60's/70's   Prostate cancer Brother        dx 60's/70's   Prostate cancer Brother        34's   Stomach cancer Brother    Prostate cancer Other    Prostate cancer Other    Lung cancer Sister     Social History Social History   Tobacco Use   Smoking status: Former Smoker    Packs/day: 0.50    Years: 14.00    Pack years: 7.00    Types: Cigarettes    Quit date: 05/02/1974    Years since quitting: 44.9   Smokeless tobacco: Never Used  Substance Use Topics   Alcohol use: No    Frequency: Never   Drug use: No     Allergies   Aspirin and Simvastatin   Review of Systems Review of Systems  Constitutional: Negative for chills and fever.  Eyes: Negative for photophobia and visual disturbance.  Respiratory: Negative for shortness of breath.   Cardiovascular: Negative for chest pain ("feels heart pounding").  Gastrointestinal: Negative for abdominal pain, nausea and vomiting.  Musculoskeletal: Positive for gait problem.  Neurological: Positive for dizziness. Negative for syncope, weakness, numbness and headaches.  All other systems reviewed and are negative.    Physical Exam Updated Vital Signs BP 138/72 (BP Location: Right Arm)    Pulse 85    Temp 98.5 F (36.9 C) (Oral)    Resp 15    SpO2  100%   Physical Exam Vitals signs and nursing note reviewed.  Constitutional:      General: He is not in acute distress.    Appearance: He is well-developed.  HENT:     Head: Normocephalic and atraumatic.  Eyes:     General:        Right eye: No discharge.        Left eye: No discharge.     Extraocular Movements: Extraocular movements intact.     Conjunctiva/sclera: Conjunctivae normal.     Pupils: Pupils are  equal, round, and reactive to light.     Comments: No nystagmus  Neck:     Vascular: No JVD.     Trachea: No tracheal deviation.  Cardiovascular:     Rate and Rhythm: Normal rate and regular rhythm.     Pulses: Normal pulses.     Heart sounds: Normal heart sounds.  Pulmonary:     Effort: Pulmonary effort is normal.     Breath sounds: Normal breath sounds.  Abdominal:     General: Bowel sounds are normal. There is no distension.     Palpations: Abdomen is soft.     Tenderness: There is no abdominal tenderness. There is no guarding or rebound.  Skin:    General: Skin is warm and dry.     Findings: No erythema.  Neurological:     Mental Status: He is alert and oriented to person, place, and time.     Coordination: Coordination abnormal.     Gait: Gait abnormal.     Comments: Mental Status:  Alert, thought content appropriate, able to give a coherent history. Speech fluent without evidence of aphasia. Able to follow 2 step commands without difficulty.  Cranial Nerves:  II:  Peripheral visual fields grossly normal, pupils equal, round, reactive to light III,IV, VI: ptosis not present, extra-ocular motions intact bilaterally  V,VII: smile symmetric, facial light touch sensation equal VIII: hearing grossly normal to voice  X: uvula elevates symmetrically  XI: bilateral shoulder shrug symmetric and strong XII: midline tongue extension without fassiculations Motor:  Normal tone. 5/5 strength of BUE and BLE major muscle groups including strong and equal grip strength and  dorsiflexion/plantar flexion.  Essential tremor noted bilaterally.  No pronator drift. Sensory: light touch normal in all extremities. Cerebellar: Mild tremulousness with finger-to-nose with bilateral upper extremities, worse on the right; Romberg sign present. gait: Ambulates with mildly ataxic gait but able to heel walk and toe walk without difficulty.   Psychiatric:        Behavior: Behavior normal.      ED Treatments / Results  Labs (all labs ordered are listed, but only abnormal results are displayed) Labs Reviewed  COMPREHENSIVE METABOLIC PANEL - Abnormal; Notable for the following components:      Result Value   Glucose, Bld 107 (*)    All other components within normal limits  I-STAT CHEM 8, ED - Abnormal; Notable for the following components:   Glucose, Bld 105 (*)    All other components within normal limits  PROTIME-INR  APTT  CBC  DIFFERENTIAL  CBG MONITORING, ED  TROPONIN I (HIGH SENSITIVITY)  TROPONIN I (HIGH SENSITIVITY)    EKG EKG Interpretation  Date/Time:  Friday March 18 2019 15:41:20 EDT Ventricular Rate:  80 PR Interval:    QRS Duration: 126 QT Interval:  372 QTC Calculation: 430 R Axis:   84 Text Interpretation:  Atrial flutter with predominant 4:1 AV block Ventricular premature complex Probable left ventricular hypertrophy Anterior ST elevation, probably due to LVH Artifact in lead(s) I II III aVR aVL aVF V1 V2 Confirmed by Milton Ferguson [phone]) on 03/18/2019 3:53:08 PM   Radiology Ct Head Wo Contrast  Result Date: 03/18/2019 CLINICAL DATA:  Altered level of consciousness and dizziness EXAM: CT HEAD WITHOUT CONTRAST TECHNIQUE: Contiguous axial images were obtained from the base of the skull through the vertex without intravenous contrast. COMPARISON:  None. FINDINGS: Brain: The ventricles are normal in size and configuration. There is no intracranial mass, hemorrhage, extra-axial fluid collection, or  midline shift. The brain parenchyma appears  unremarkable. No acute infarct evident. Vascular: No hyperdense vessel.  No evident vascular calcification. Skull: Bony calvarium appears intact. Sinuses/Orbits: There is mucosal thickening in several ethmoid air cells. Other visualized paranasal sinuses are clear. There is leftward deviation of the nasal septum. Orbits appear symmetric bilaterally. Other: Mastoid air cells on the left are clear. Mastoids on the right are hypoplastic. IMPRESSION: Brain parenchyma appears unremarkable. No acute infarct evident. No mass or hemorrhage. There is mucosal thickening in several ethmoid air cells. Electronically Signed   By: Lowella Grip III M.D.   On: 03/18/2019 15:04   Mr Brain Wo Contrast (neuro Protocol)  Result Date: 03/18/2019 CLINICAL DATA:  Dizziness for 2 days. EXAM: MRI HEAD WITHOUT CONTRAST TECHNIQUE: Multiplanar, multiecho pulse sequences of the brain and surrounding structures were obtained without intravenous contrast. COMPARISON:  Head CT 03/18/2019 FINDINGS: Brain: There is no evidence of acute infarct, intracranial hemorrhage, mass, midline shift, or extra-axial fluid collection. The ventricles and sulci are normal. The brain is normal in signal. There is a nonexpanded partially empty sella, likely incidental in this setting. Vascular: The distal right vertebral artery is small and poorly visualized, likely congenitally hypoplastic. Other major intracranial vascular flow voids are preserved. Skull and upper cervical spine: Unremarkable bone marrow signal. Sinuses/Orbits: Unremarkable orbits. Right mastoid effusion. No acute inflammatory changes in the paranasal sinuses. Other: None. IMPRESSION: 1. No acute or significant intracranial findings. 2. Right mastoid effusion. Electronically Signed   By: Logan Bores M.D.   On: 03/18/2019 17:55    Procedures Procedures (including critical care time)  Medications Ordered in ED Medications  sodium chloride flush (NS) 0.9 % injection 3 mL (has no  administration in time range)     Initial Impression / Assessment and Plan / ED Course  I have reviewed the triage vital signs and the nursing notes.  Pertinent labs & imaging results that were available during my care of the patient were reviewed by me and considered in my medical decision making (see chart for details).        Patient presenting for evaluation of progressively worsening episodic dizziness that occurs with attempts to ambulate and stand up only.  He is afebrile, vital signs are stable.  He is nontoxic in appearance.  He is ambulatory without difficulty in the ED though Romberg sign is present.  CT head shows no acute intracranial abnormalities but does show mucosal thickening in several ethmoid air cells.  His EKG shows significant amount of artifact but probable normal sinus rhythm with no acute ischemic abnormalities.  He did complain of a sensation of feeling his heartbeat but denied chest pain and a troponin was obtained which was negative.  His symptoms are quite atypical in nature and I doubt ACS/MI.  With concern for possible posterior circulation stroke or central vertigo, will obtain MRI for further evaluation.  Remainder blood work reviewed by me shows no leukocytosis, no anemia, no metabolic derangements, no renal insufficiency.  MRI shows findings consistent with a right mastoid effusion, no acute intracranial abnormalities.  No evidence of CVA, ICH, SAH.  No fever or headache to suggest meningitis.  On reevaluation patient resting comfortably no apparent distress.  Will discharge with course of antibiotics and symptomatic management.  Recommend follow-up with PCP for reevaluation of symptoms.  Discussed strict ED return precautions.  Patient verbalized understanding of and agreement with plan and patient stable for discharge home at this time.  Patient was seen and evaluated  by Dr. Roderic Palau who agrees with assessment and plan at this time.  Final Clinical Impressions(s)  / ED Diagnoses   Final diagnoses:  Dizziness  Acute maxillary sinusitis, recurrence not specified    ED Discharge Orders         Ordered    amoxicillin (AMOXIL) 500 MG capsule  3 times daily     03/18/19 1845    fluticasone (FLONASE) 50 MCG/ACT nasal spray  Daily     03/18/19 1845           Renita Papa, PA-C 03/18/19 Diamantina Monks, MD 03/19/19 1534

## 2019-04-21 DIAGNOSIS — J309 Allergic rhinitis, unspecified: Secondary | ICD-10-CM | POA: Diagnosis not present

## 2019-04-21 DIAGNOSIS — E7800 Pure hypercholesterolemia, unspecified: Secondary | ICD-10-CM | POA: Diagnosis not present

## 2019-04-21 DIAGNOSIS — K219 Gastro-esophageal reflux disease without esophagitis: Secondary | ICD-10-CM | POA: Diagnosis not present

## 2019-04-21 DIAGNOSIS — I10 Essential (primary) hypertension: Secondary | ICD-10-CM | POA: Diagnosis not present

## 2019-04-21 DIAGNOSIS — G250 Essential tremor: Secondary | ICD-10-CM | POA: Diagnosis not present

## 2019-04-21 DIAGNOSIS — R42 Dizziness and giddiness: Secondary | ICD-10-CM | POA: Diagnosis not present

## 2019-04-22 DIAGNOSIS — E7800 Pure hypercholesterolemia, unspecified: Secondary | ICD-10-CM | POA: Diagnosis not present

## 2019-04-22 DIAGNOSIS — I10 Essential (primary) hypertension: Secondary | ICD-10-CM | POA: Diagnosis not present

## 2019-06-25 DIAGNOSIS — Z23 Encounter for immunization: Secondary | ICD-10-CM | POA: Diagnosis not present

## 2019-08-15 DIAGNOSIS — C61 Malignant neoplasm of prostate: Secondary | ICD-10-CM | POA: Diagnosis not present

## 2019-08-15 DIAGNOSIS — R1033 Periumbilical pain: Secondary | ICD-10-CM | POA: Diagnosis not present

## 2019-08-15 DIAGNOSIS — R3915 Urgency of urination: Secondary | ICD-10-CM | POA: Diagnosis not present

## 2019-10-25 DIAGNOSIS — K5900 Constipation, unspecified: Secondary | ICD-10-CM | POA: Diagnosis not present

## 2019-10-25 DIAGNOSIS — E7800 Pure hypercholesterolemia, unspecified: Secondary | ICD-10-CM | POA: Diagnosis not present

## 2019-10-25 DIAGNOSIS — I10 Essential (primary) hypertension: Secondary | ICD-10-CM | POA: Diagnosis not present

## 2019-10-25 DIAGNOSIS — J309 Allergic rhinitis, unspecified: Secondary | ICD-10-CM | POA: Diagnosis not present

## 2019-10-25 DIAGNOSIS — Z0000 Encounter for general adult medical examination without abnormal findings: Secondary | ICD-10-CM | POA: Diagnosis not present

## 2020-02-06 DIAGNOSIS — C61 Malignant neoplasm of prostate: Secondary | ICD-10-CM | POA: Diagnosis not present

## 2020-02-13 ENCOUNTER — Other Ambulatory Visit (HOSPITAL_COMMUNITY): Payer: Self-pay | Admitting: Urology

## 2020-02-13 DIAGNOSIS — C61 Malignant neoplasm of prostate: Secondary | ICD-10-CM | POA: Diagnosis not present

## 2020-02-13 DIAGNOSIS — R3915 Urgency of urination: Secondary | ICD-10-CM | POA: Diagnosis not present

## 2020-03-01 ENCOUNTER — Other Ambulatory Visit: Payer: Self-pay

## 2020-03-01 ENCOUNTER — Encounter (HOSPITAL_COMMUNITY)
Admission: RE | Admit: 2020-03-01 | Discharge: 2020-03-01 | Disposition: A | Discharge status: Home / Self Care | Payer: Medicare HMO | Source: Ambulatory Visit | Attending: Urology | Admitting: Urology

## 2020-03-01 ENCOUNTER — Ambulatory Visit (HOSPITAL_COMMUNITY)
Admission: RE | Admit: 2020-03-01 | Discharge: 2020-03-01 | Disposition: A | Discharge status: Home / Self Care | Payer: Medicare HMO | Source: Ambulatory Visit | Attending: Urology | Admitting: Urology

## 2020-03-01 DIAGNOSIS — K769 Liver disease, unspecified: Secondary | ICD-10-CM | POA: Diagnosis not present

## 2020-03-01 DIAGNOSIS — C61 Malignant neoplasm of prostate: Secondary | ICD-10-CM | POA: Insufficient documentation

## 2020-03-01 DIAGNOSIS — I700 Atherosclerosis of aorta: Secondary | ICD-10-CM | POA: Diagnosis not present

## 2020-03-01 DIAGNOSIS — K6389 Other specified diseases of intestine: Secondary | ICD-10-CM | POA: Diagnosis not present

## 2020-03-01 IMAGING — NM NM BONE WHOLE BODY
2 series · 2 of 2 positions shown · non-contrast
Comparison: CT abdomen and pelvis [DATE]

CLINICAL DATA: Prostate cancer.

EXAM:
NUCLEAR MEDICINE WHOLE BODY BONE SCAN
TECHNIQUE: Whole body anterior and posterior images were obtained approximately
3 hours after intravenous injection of radiopharmaceutical.
RADIOPHARMACEUTICALS:  21.4 mCi [H4] MDP IV

[Series 1: wbr_bone_40 whole body · 2.66mm/px · 1 of 1 slices shown (1 of 2)]
[im 1/1]
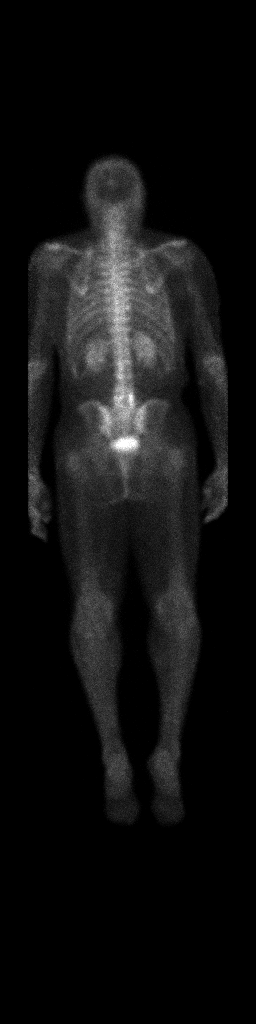

[Series 1: wbr_bone_40 whole body · 2.66mm/px · 1 of 1 slices shown (2 of 2)]
[im 1/1]
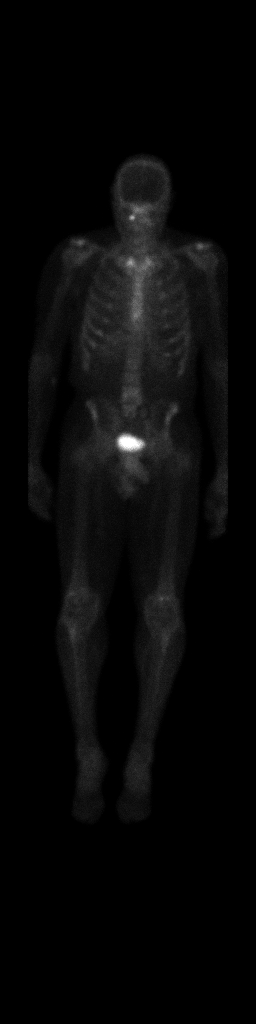

[2 of 2 positions shown; findings below may reference images not displayed]

FINDINGS: Marked degenerative changes on the CT from [DATE]st at the L5-S1
level. Corresponding uptake in this area on the bone scan.

Area of increased radiotracer uptake projecting over the RIGHT
infraorbital region and lateral cervical spine on the frontal view.
Not well seen on the posterior projection

Physiologic activity in the urinary tract.
IMPRESSION: 1. Degenerative changes about the sternoclavicular joints and in the
lumbar spine.
2. Area of increased uptake in the area of the RIGHT infraorbital
region projects also over the upper cervical spine, potentially
related to degenerative changes. Dedicated plain film assessment may
be a reasonable first step in further evaluating this area.

## 2020-03-01 MED ORDER — TECHNETIUM TC 99M MEDRONATE IV KIT
20.0000 | PACK | Freq: Once | INTRAVENOUS | Status: AC | PRN
  Administered 2020-03-01: 21.4 via INTRAVENOUS

## 2020-03-27 DIAGNOSIS — C7A019 Malignant carcinoid tumor of the small intestine, unspecified portion: Secondary | ICD-10-CM | POA: Diagnosis not present

## 2020-03-27 DIAGNOSIS — C61 Malignant neoplasm of prostate: Secondary | ICD-10-CM | POA: Diagnosis not present

## 2020-03-27 DIAGNOSIS — R3915 Urgency of urination: Secondary | ICD-10-CM | POA: Diagnosis not present

## 2020-03-30 ENCOUNTER — Telehealth: Payer: Self-pay | Admitting: Nurse Practitioner

## 2020-03-30 NOTE — Telephone Encounter (Signed)
Received a new pt referral from Dr. 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 Ryan Wilkerson has been cld and scheduled to see [name] on 8/2 at 945am. Pt aware to arrive 15 minutes early.

## 2020-04-01 NOTE — Progress Notes (Addendum)
[place]  Telephone:([phone]) [phone] Fax:([phone]) [place] Note   Patient Care Team: Alroy Dust, L.Marlou Sa, MD as PCP - General (Family Medicine) Jonnie Finner, RN as Oncology Nurse Navigator Truitt Merle, MD as Consulting Physician (Oncology) Alla Feeling, NP as Nurse Practitioner (Nurse Practitioner) 04/02/2020  CHIEF COMPLAINTS/PURPOSE OF CONSULTATION:  Mesenteric mass, referred by urologist Dr. Tresa Moore  HISTORY OF PRESENTING ILLNESS:  Ryan Wilkerson 74 y.o. 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 Tammi Klippel is here because of mesenteric mass.  Due to rising PSA in 01/2020 he underwent restaging which showed a 2.5 x 2.0 x 3.0 cm soft tissue lesion in the central small bowel mesentery with associated dystrophic calcification and retraction of adjacent small bowel loops with an upper normal 9 mm short axis central mesenteric lymph node.  Additionally a 1.6 x 1.4 cm poorly defined lesion in the inferior right liver is suspicious.  Bone scan was negative for osseous metastasis.  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  Socially, he is married lives with his wife.  He is retired from [employer] work.  He is independent with ADLs including driving.  He has 4 children including 1 son and 2 living daughters, and one deceased daughter from suspected MI.  He is up-to-date on colonoscopy, last done in 2018 with one polyp removed.  He reports he had an endoscopy sometime between 2014-2018 at [facility] per Dr. Cristina Gong.  Denies alcohol or illicit drug use.  He is a former tobacco smoker for 18 years, quit 45 years ago.  He has 11 siblings, 67 of 7 brothers all had prostate cancer, 1 of the brothers had prostate and lung cancer.  A sister had lung cancer who was a  smoker.  One sister had breast cancer.  The patient has had genetic testing with a negative report on 10/20/2017.  Today he presents with his daughter.  He has had moderate fatigue and 10 pounds weight loss in the last year.  He is out of bed and functional but with limitations.  About a year and a half ago he developed constipation, took stool softener for 3 days the constipation resolved but has continued having intermittent bilateral abdominal pain/cramping since then.  He has flushing episodes 2-3 times per week for the last 1.5 years.  Denies recent fever, chills, cough, chest pain, dyspnea, leg edema.    MEDICAL HISTORY:  Past Medical History:  Diagnosis Date  . Erectile dysfunction   . Essential tremor   . Family history of breast cancer   . Family history of prostate cancer   . Family history of stomach cancer   . GERD (gastroesophageal reflux disease)   . History of colon polyps   . Hypercholesterolemia   . Hypertension   . Nocturia   . Prostate cancer [facility]) urologist-- dr Tresa Moore /  oncologist-- dr manning   prostate bx 06-24-2016 and 09-15-2017 at dr Tresa Moore office  Stage T1c, Gleason 4+3, PSA 8.48, vol 33cc---  planned external beam radiation therpay  . Urgency of urination   . Wears glasses     SURGICAL HISTORY: Past Surgical History:  Procedure Laterality Date  . COLONOSCOPY  last one 06/ 2018  . GOLD SEED IMPLANT N/A 12/23/2017   Procedure: GOLD SEED IMPLANT;  Surgeon: Alexis Frock, MD;  Location: [facility];  Service: Urology;  Laterality: N/A;  . PROSTATE BIOPSY  06-24-2016;  09-15-2017-- at dr Tresa Moore office  . SPACE OAR INSTILLATION N/A 12/23/2017   Procedure: SPACE OAR INSTILLATION;  Surgeon: Alexis Frock, MD;  Location: [facility];  Service: Urology;  Laterality: N/A;    SOCIAL HISTORY: Social History   Socioeconomic History  . Marital status: Married    Spouse name: Not on file  . Number of children: 4  . Years of  education: Not on file  . Highest education level: Not on file  Occupational History  . Occupation: Retired    Comment: Automotive engineer  Tobacco Use  . Smoking status: Former Smoker    Packs/day: 0.50    Years: 18.00    Pack years: 9.00    Types: Cigarettes    Quit date: 05/02/1974    Years since quitting: 45.9  . Smokeless tobacco: Never Used  Vaping Use  . Vaping Use: Never used  Substance and Sexual Activity  . Alcohol use: No  . Drug use: No  . Sexual activity: Yes  Other Topics Concern  . Not on file  Social History Narrative   Married with 3 daughters, 1 son. 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   Social Determinants of Health   Financial Resource Strain:   . Difficulty of Paying Living Expenses:   Food Insecurity:   . Worried About Charity fundraiser in the Last Year:   . Arboriculturist in the Last Year:   Transportation Needs:   . Film/video editor (Medical):   Marland Kitchen Lack of Transportation (Non-Medical):   Physical Activity:   . Days of Exercise per Week:   . Minutes of Exercise per Session:   Stress:   . Feeling of Stress :   Social Connections:   . Frequency of Communication with Friends and Family:   . Frequency of Social Gatherings with Friends and Family:   . Attends Religious Services:   . Active Member of Clubs or Organizations:   . Attends Archivist Meetings:   Marland Kitchen Marital Status:   Intimate Partner Violence:   . Fear of Current or Ex-Partner:   . Emotionally Abused:   Marland Kitchen Physically Abused:   . Sexually Abused:     FAMILY HISTORY: Family History  Problem Relation Age of Onset  . Prostate cancer Brother 69  . Prostate cancer Brother        metastatic/ late treatment dx in 60's/70's  . Breast cancer Sister 76  . Prostate cancer Brother        dx in 60's/70's  . Prostate cancer Brother        dx 60's/70's  . Prostate cancer Brother         dx 60's/70's  . Prostate cancer Brother        41's  . Lung cancer Brother   . Prostate cancer Other   . Prostate cancer Other   . Lung cancer Sister     ALLERGIES:  is allergic to aspirin and simvastatin.  MEDICATIONS:  Current Outpatient Medications  Medication Sig Dispense Refill  . acetaminophen (TYLENOL) 500 MG tablet Take 1,000 mg by mouth every 6 (six) hours as needed for headache (pain).    Marland Kitchen diclofenac (VOLTAREN) 75 MG EC tablet     . fluticasone (FLONASE) 50 MCG/ACT nasal spray Place 2  sprays into both nostrils daily. 16 g 0  . levETIRAcetam (KEPPRA) 250 MG tablet Take 250 mg by mouth 3 (three) times daily. Take 125 mg( 1/2 tab) po TID x 3 weeks, increase to 250 mg(1 tab) po TID for 3 weeks, increase to 375 mg TID    . lisinopril-hydrochlorothiazide (PRINZIDE,ZESTORETIC) 20-12.5 MG tablet Take 1 tablet by mouth every morning.     . loratadine (CLARITIN) 10 MG tablet Take 10 mg by mouth every morning.     . meclizine (ANTIVERT) 25 MG tablet Take 25 mg by mouth 4 (four) times daily as needed for dizziness.     . Melatonin 5 MG TABS Take 5 mg by mouth at bedtime.    Marland Kitchen OVER THE COUNTER MEDICATION Place 1 drop into both eyes daily as needed (dry eyes). Over the counter lubricating eye drop    . pravastatin (PRAVACHOL) 40 MG tablet Take 40 mg by mouth every morning.     . tamsulosin (FLOMAX) 0.4 MG CAPS capsule Take 0.4 mg by mouth at bedtime.     No current facility-administered medications for this visit.    REVIEW OF SYSTEMS:   Constitutional: Denies fevers, chills or abnormal night sweats (+) 10 pounds unintentional weight loss over 1 year Eyes: Denies blurriness of vision, double vision or watery eyes Ears, nose, mouth, throat, and face: Denies mucositis or sore throat Respiratory: Denies cough, dyspnea or wheezes Cardiovascular: Denies palpitation, chest discomfort or lower extremity swelling Gastrointestinal:  Denies nausea, vomiting, constipation, diarrhea, heartburn  or change in bowel habits (+) intermittent bilateral abdominal pain/cramping Skin: Denies abnormal skin rashes Lymphatics: Denies new lymphadenopathy or easy bruising Neurological:Denies numbness, tingling or new weaknesses (+) flushing 2-3 times weekly x1.5 years (+) history of stroke with residual left-sided weakness and seizure activity Behavioral/Psych: Mood is stable, no new changes  All other systems were reviewed with the patient and are negative.  PHYSICAL EXAMINATION: ECOG PERFORMANCE STATUS: 1 - Symptomatic but completely ambulatory  Vitals:   04/02/20 0943  BP: (!) 136/70  Pulse: 80  Resp: 16  Temp: 98.8 F (37.1 C)  SpO2: 100%   Filed Weights   04/02/20 0943  Weight: 158 lb 11.2 oz (72 kg)    GENERAL:alert, no distress and comfortable SKIN: No rash to exposed skin EYES: sclera clear NECK: Without mass LUNGS: clear with normal breathing effort HEART: regular rate & rhythm, no lower extremity edema ABDOMEN:abdomen soft, non-tender and normal bowel sounds.  Fullness with ttp in the middle abdomen, just above the navel  Musculoskeletal:no cyanosis of digits and no clubbing  PSYCH: alert & oriented x 3 with fluent speech NEURO: Normal gait  LABORATORY DATA:  I have reviewed the data as listed CBC Latest Ref Rng & Units 04/02/2020 03/18/2019 03/18/2019  WBC 4.0 - 10.5 K/uL 6.0 - 9.1  Hemoglobin 13.0 - 17.0 g/dL 13.0 14.6 13.1  Hematocrit 39 - 52 % 40.5 43.0 41.2  Platelets 150 - 400 K/uL 216 - 219   CMP Latest Ref Rng & Units 04/02/2020 03/18/2019 03/18/2019  Glucose 70 - 99 mg/dL 88 105(H) 107(H)  BUN 8 - 23 mg/dL 12 11 10   Creatinine 0.61 - 1.24 mg/dL 1.16 1.20 1.19  Sodium 135 - 145 mmol/L 139 139 139  Potassium 3.5 - 5.1 mmol/L 4.1 3.9 3.9  Chloride 98 - 111 mmol/L 103 102 104  CO2 22 - 32 mmol/L 28 - 27  Calcium 8.9 - 10.3 mg/dL 9.9 - 9.8  Total Protein 6.5 - 8.1 g/dL  7.7 - 7.1  Total Bilirubin 0.3 - 1.2 mg/dL 0.7 - 0.6  Alkaline Phos 38 - 126 U/L 65 - 51   AST 15 - 41 U/L 22 - 26  ALT 0 - 44 U/L 37 - 23     RADIOGRAPHIC STUDIES: I have personally reviewed the radiological images as listed and agreed with the findings in the report. No results found.  ASSESSMENT & PLAN: 74 year old male with history of stroke, HTN, HL, GERD, prostate cancer with  1.  Mesenteric mass -We reviewed his medical record and outside imaging in detail with the patient and his daughter. He developed fatigue, mild weight loss, abdominal pain, and flushing over a year ago, after treatment for prostate cancer. Exam shows tenderness and fullness in the supraumbilical area, no palpable mass.  -due to rising PSA, CT/bone scan were obtained showing a mesenteric mass and single liver lesion. We do not have previous CT to compare.  -the clinical picture is concerning for malignancy, the differential includes metastatic carcinoid vs metastatic prostate cancer. We reviewed the usual nature of neuroendocrine tumors and briefly discussed therapeutic options.  -We are referring him for PET dotatate for further exploration, and to determine the primary site.  -If the liver lesion is positive we will likely recommend liver biopsy. If he needs endoscopy we will refer him back to Dr. Cristina Gong. I have requested his previous endoscopy report.  -We will discuss his case in GI tumor board -Baseline labs show normal CBC and CMP, the chromogranin A is pending from today.  -F/u in 3 weeks to review work up, diagnosis, and finalize a treatment plan   2.  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 Tammi Klippel -PSA down to 1.8 on 08/2018, steady rise to 4.69 on 6/21 -CT/bone scan in 6/21 showed no obvious metastatic disease from prostate cancer but did reveal mesenteric mass and liver lesion -Followed by Dr. Tresa Moore at [facility] urology, further work-up/treatment pending work-up of  mesenteric mass and liver lesion  3.  Family history/genetics -He has personal and strong family history of cancers, including 7 brothers with prostate cancer, 1 of those has lung cancer also, a sister with lung cancer and another sister with breast cancer -Patient underwent genetic testing with [facility] Common hereditary cancer panel including the prostate cancer panel which was negative for pathogenic mutation or VUS per report dated 10/28/2017  4. Comorbidities: HTN, HL, GERD, history of stroke 03/2019 with residual left side weakness and seizure-like activity -He was having bandlike tightness around his head, neuro felt this was seizure activity related to remote stroke in 03/2019, seizures controlled on Keppra  -on Aspirin alternative, pt allergic to aspirin  -continue follow-up with PCP, neurology  Plan: -Outside medical record and imaging reviewed -I have requested previous upper endoscopy per Dr. Cristina Gong (done in 2014-2018?) -Lab today -Referred for PET dotatate, possible liver biopsy -Discuss case in GI tumor board -Follow-up in 3 weeks to review work-up  Orders Placed This Encounter  Procedures  . NM PET (NETSPOT GA 76 DOTATATE) SKULL BASE TO MID THIGH    Standing Status:   Future    Standing Expiration Date:   04/02/2021    Order Specific Question:   If indicated for the ordered procedure, I authorize the administration of a radiopharmaceutical per Radiology protocol    Answer:   Yes    Order Specific Question:  Preferred imaging location?    Answer:   [facility]    Order Specific Question:   Radiology Contrast Protocol - do NOT remove file path    Answer:   [email or web address]  . CBC with Differential ([place] Only)    Standing Status:   Standing    Number of Occurrences:   20    Standing Expiration Date:   04/02/2021  . CMP ([place] only)    Standing Status:   Standing    Number of Occurrences:   20    Standing Expiration Date:    04/02/2021  . Chromogranin A    Standing Status:   Standing    Number of Occurrences:   12    Standing Expiration Date:   04/02/2021    All questions were answered. The patient knows to call the clinic with any problems, questions or concerns.     Alla Feeling, NP 04/02/2020   Addendum  I have seen the patient, examined him. I agree with the assessment and and plan and have edited the notes.   74 year old male with past medical history of hypertension, stroke a year ago with residual left-sided weakness, prostate cancer diagnosed in 2019 status post radiation, with recent a rising PSA, presented with intermittent abdominal cramps, change of bowel movement, flushing and weight loss.  Recent CT scan for breast cancer work-up showed a mesenteric mass and suspicious liver lesion, concerning for metastatic malignancy, especially carcinoid tumor.  I reviewed the CT scan images with patient and his daughter in details.  I recommend a dotatate PET scan, and blood/urine tests including tumor markers. If insurance does not approved dotatate PET scan, I will obtain a liver MRI, and liver biopsy if MRI is highly suspicious for metastatic malignancy. We briefly discussed treatment options for metastatic carcinoid tumor, including surgery, and systemic therapy.  I answered all his questions. Will see him back after his above workup.   Truitt Merle  04/02/2020

## 2020-04-02 ENCOUNTER — Other Ambulatory Visit: Payer: Self-pay

## 2020-04-02 ENCOUNTER — Inpatient Hospital Stay: Payer: Medicare HMO

## 2020-04-02 ENCOUNTER — Encounter: Payer: Self-pay | Admitting: Nurse Practitioner

## 2020-04-02 ENCOUNTER — Inpatient Hospital Stay: Payer: Medicare HMO | Attending: Nurse Practitioner | Admitting: Nurse Practitioner

## 2020-04-02 VITALS — BP 136/70 | HR 80 | Temp 98.8°F | Resp 16 | Ht 68.0 in | Wt 158.7 lb

## 2020-04-02 DIAGNOSIS — K6389 Other specified diseases of intestine: Secondary | ICD-10-CM

## 2020-04-02 DIAGNOSIS — K219 Gastro-esophageal reflux disease without esophagitis: Secondary | ICD-10-CM | POA: Diagnosis not present

## 2020-04-02 DIAGNOSIS — Z791 Long term (current) use of non-steroidal anti-inflammatories (NSAID): Secondary | ICD-10-CM

## 2020-04-02 DIAGNOSIS — Z800 Family history of malignant neoplasm of digestive organs: Secondary | ICD-10-CM

## 2020-04-02 DIAGNOSIS — K769 Liver disease, unspecified: Secondary | ICD-10-CM | POA: Diagnosis not present

## 2020-04-02 DIAGNOSIS — Z87891 Personal history of nicotine dependence: Secondary | ICD-10-CM | POA: Diagnosis not present

## 2020-04-02 DIAGNOSIS — R1907 Generalized intra-abdominal and pelvic swelling, mass and lump: Secondary | ICD-10-CM

## 2020-04-02 DIAGNOSIS — Z803 Family history of malignant neoplasm of breast: Secondary | ICD-10-CM | POA: Diagnosis not present

## 2020-04-02 DIAGNOSIS — I10 Essential (primary) hypertension: Secondary | ICD-10-CM | POA: Diagnosis not present

## 2020-04-02 DIAGNOSIS — Z801 Family history of malignant neoplasm of trachea, bronchus and lung: Secondary | ICD-10-CM | POA: Diagnosis not present

## 2020-04-02 DIAGNOSIS — Z8673 Personal history of transient ischemic attack (TIA), and cerebral infarction without residual deficits: Secondary | ICD-10-CM

## 2020-04-02 DIAGNOSIS — G250 Essential tremor: Secondary | ICD-10-CM

## 2020-04-02 DIAGNOSIS — Z8042 Family history of malignant neoplasm of prostate: Secondary | ICD-10-CM

## 2020-04-02 DIAGNOSIS — R232 Flushing: Secondary | ICD-10-CM

## 2020-04-02 DIAGNOSIS — C61 Malignant neoplasm of prostate: Secondary | ICD-10-CM

## 2020-04-02 DIAGNOSIS — Z79899 Other long term (current) drug therapy: Secondary | ICD-10-CM

## 2020-04-02 LAB — CMP (CANCER CENTER ONLY)
ALT: 37 U/L (ref 0–44)
AST: 22 U/L (ref 15–41)
Albumin: 4 g/dL (ref 3.5–5.0)
Alkaline Phosphatase: 65 U/L (ref 38–126)
Anion gap: 8 (ref 5–15)
BUN: 12 mg/dL (ref 8–23)
CO2: 28 mmol/L (ref 22–32)
Calcium: 9.9 mg/dL (ref 8.9–10.3)
Chloride: 103 mmol/L (ref 98–111)
Creatinine: 1.16 mg/dL (ref 0.61–1.24)
GFR, Est AFR Am: >60 mL/min (ref >60)
GFR, Estimated: >60 mL/min (ref >60)
Glucose, Bld: 88 mg/dL (ref 70–99)
Potassium: 4.1 mmol/L (ref 3.5–5.1)
Sodium: 139 mmol/L (ref 135–145)
Total Bilirubin: 0.7 mg/dL (ref 0.3–1.2)
Total Protein: 7.7 g/dL (ref 6.5–8.1)

## 2020-04-02 LAB — CBC WITH DIFFERENTIAL (CANCER CENTER ONLY)
Abs Immature Granulocytes: 0.02 10*3/uL (ref 0.00–0.07)
Basophils Absolute: 0.1 10*3/uL (ref 0.0–0.1)
Basophils Relative: 1 %
Eosinophils Absolute: 0.3 10*3/uL (ref 0.0–0.5)
Eosinophils Relative: 4 %
HCT: 40.5 % (ref 39.0–52.0)
Hemoglobin: 13 g/dL (ref 13.0–17.0)
Immature Granulocytes: 0 %
Lymphocytes Relative: 24 %
Lymphs Abs: 1.4 10*3/uL (ref 0.7–4.0)
MCH: 30 pg (ref 26.0–34.0)
MCHC: 32.1 g/dL (ref 30.0–36.0)
MCV: 93.5 fL (ref 80.0–100.0)
Monocytes Absolute: 0.5 10*3/uL (ref 0.1–1.0)
Monocytes Relative: 8 %
Neutro Abs: 3.7 10*3/uL (ref 1.7–7.7)
Neutrophils Relative %: 63 %
Platelet Count: 216 10*3/uL (ref 150–400)
RBC: 4.33 MIL/uL (ref 4.22–5.81)
RDW: 11.5 % (ref 11.5–15.5)
WBC Count: 6 10*3/uL (ref 4.0–10.5)
nRBC: 0 % (ref 0.0–0.2)

## 2020-04-02 NOTE — Progress Notes (Signed)
Met with patient and his daughter Vernell Barrier today prior to his initial medical oncology consult with Cira Rue NP and Dr. Burr Medico.  I explained my role as nurse navigator and they were given my card with my direct contact information.

## 2020-04-03 ENCOUNTER — Telehealth: Payer: Self-pay

## 2020-04-03 ENCOUNTER — Telehealth: Payer: Self-pay | Admitting: Nurse Practitioner

## 2020-04-03 NOTE — Telephone Encounter (Signed)
Recent consult note faxed to Dr. Tresa Moore office at [facility] urology.

## 2020-04-03 NOTE — Telephone Encounter (Signed)
-----   Message from Truitt Merle, MD sent at 04/02/2020 11:43 PM EDT ----- Brynda Peon,  Please send consult note to Dr. Tresa Moore at [facility], thanks  Krista Blue

## 2020-04-03 NOTE — Telephone Encounter (Signed)
Scheduled per 8/2 los. Pt is aware of appt time and date. 

## 2020-04-05 LAB — CHROMOGRANIN A: Chromogranin A (ng/mL): 92.8 ng/mL (ref 0.0–101.8)

## 2020-04-09 ENCOUNTER — Other Ambulatory Visit: Payer: Self-pay

## 2020-04-09 ENCOUNTER — Ambulatory Visit (HOSPITAL_COMMUNITY)
Admission: RE | Admit: 2020-04-09 | Discharge: 2020-04-09 | Disposition: A | Discharge status: Home / Self Care | Payer: Medicare HMO | Source: Ambulatory Visit | Attending: Nurse Practitioner | Admitting: Nurse Practitioner

## 2020-04-09 DIAGNOSIS — D3A8 Other benign neuroendocrine tumors: Secondary | ICD-10-CM | POA: Diagnosis not present

## 2020-04-09 DIAGNOSIS — K6389 Other specified diseases of intestine: Secondary | ICD-10-CM | POA: Insufficient documentation

## 2020-04-09 DIAGNOSIS — C799 Secondary malignant neoplasm of unspecified site: Secondary | ICD-10-CM | POA: Diagnosis not present

## 2020-04-09 DIAGNOSIS — K8689 Other specified diseases of pancreas: Secondary | ICD-10-CM | POA: Diagnosis not present

## 2020-04-09 DIAGNOSIS — C7A8 Other malignant neuroendocrine tumors: Secondary | ICD-10-CM | POA: Diagnosis not present

## 2020-04-09 IMAGING — CT NM PET SKULL BASE TO THIGH
1 of 8 series · 1 of 25 positions shown · non-contrast
Comparison: CT [DATE]

CLINICAL DATA: Suspicious mesenteric mass liver lesion. Concern for
carcinoid tumor.

EXAM:
NUCLEAR MEDICINE PET SKULL BASE TO THIGH
TECHNIQUE: 3.6 mCi Ga 68 DOTATATE was injected intravenously. Full-ring PET
imaging was performed from the skull base to thigh after the
radiotracer. CT data was obtained and used for attenuation
correction and anatomic localization.

[Series 3: pet sk_thigh ac · axial · 5.0mm · 4.07mm/px · 1 of 220 slices shown]
[im 110/220]
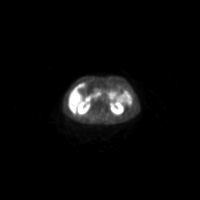

[1 of 25 positions shown; findings below may reference images not displayed]

FINDINGS: NECK

No radiotracer activity in neck lymph nodes.

Incidental CT findings: None

CHEST

No radiotracer accumulation within mediastinal or hilar lymph nodes.
No suspicious pulmonary nodules on the CT scan.

Single focus of radiotracer activity is noted in the LEFT
pericardial space. 6 mm nodules position between the pericardium and
LEFT ventricle on image 78/4. This nodule has associated radiotracer
activity SUV max equal 10 point 9.

Incidental CT finding:None

ABDOMEN/PELVIS

Partially calcified central mesenteric mass measuring 2 cm (image
39/2) and has intense radiotracer activity with SUV max equal 89.
Smaller mesenteric nodule just superior measuring 9 mm (image 121/4
)also has intense radiotracer activity.

There are 3 discrete lesions within liver with intense radiotracer
activity. Largest is in the inferior RIGHT hepatic lobe measuring 2
cm SUV max equal 59. A smaller subcapsular lesion in the anterior
central LEFT hepatic lobe with SUV max equal 19.5. Lesion in the
dome of the RIGHT hepatic lobe with SUV max equal 24.

No focal activity associated with the within the small bowel to
localize a primary. A tiny focus of relatively mild activity (but
elevated above background pancreas) is noted in the body the
pancreas with SUV max equal 12.8. Image 99 fused data set)

There several metastatic implants along the LEFT iliac vessels. For
example small lesion measuring 5 mm image 159/4 with SUV max equal
29.5.

Physiologic activity noted in the liver, spleen, adrenal glands and
kidneys.

Incidental CT findings:None

SKELETON

No evidence skeletal metastasis.

Incidental CT findings:None
IMPRESSION: 1. Central mesenteric mass with intense radiotracer activity
consistent well differentiated neuroendocrine tumor
2. Three well differentiated neuroendocrine tumor metastasis to the
liver.
3. Small peritoneal/nodal metastasis along the LEFT iliac vessels.
4. Very small lesion in the pancreas with intermediate activity
could represent primary lesion. No primary bowel lesion identified.
5. Small metastatic implant within the pericardium adjacent LEFT
heart ventricle.
6. No skeletal metastasis.

## 2020-04-09 MED ORDER — GALLIUM GA 68 DOTATATE IV KIT
3.6100 | PACK | Freq: Once | INTRAVENOUS | Status: AC | PRN
  Administered 2020-04-09: 3.61 via INTRAVENOUS

## 2020-04-10 ENCOUNTER — Telehealth: Payer: Self-pay | Admitting: Nurse Practitioner

## 2020-04-10 DIAGNOSIS — K6389 Other specified diseases of intestine: Secondary | ICD-10-CM

## 2020-04-10 NOTE — Telephone Encounter (Signed)
I called [name], he is feeling okay, manages constipation with fiber and water.  Has occasional sharp bilateral abdominal pain ongoing for several months, pain lasts "a second" and occurs q. 2-3 days.  He is out of bed and active and functional at home.  He has no other complaints.  I reviewed his PET dotatate scan which shows the mesenteric mass is intensely hypermetabolic, in addition to hypermetabolic hepatic and peritoneal masses consistent with metastatic neuroendocrine/carcinoid tumor.  The primary site is still unknown.  We are recommending a liver biopsy for tissue diagnosis and determine the primary site.   Assuming this will confirm metastatic neuroendocrine/carcinoid tumor, his disease is not resectable at this point.  We are recommending treatment with octreotide injection for symptom management and disease control.  Side effects including but not limited to fatigue, hypertension, and GI side effects were discussed.  He is interested in treatment.  He is on Plavix 75 mg daily for history of suspected stroke in 03/2019, he will need to hold this a few days before the liver biopsy.  We will keep his follow-up on 8/23 to review the pathology, diagnosis/prognosis, and finalize the treatment plan. He agrees with the plan and appreciates the call.  Cira Rue, NP

## 2020-04-11 ENCOUNTER — Encounter (HOSPITAL_COMMUNITY): Payer: Self-pay | Admitting: Radiology

## 2020-04-11 NOTE — Progress Notes (Signed)
Ryan Wilkerson [facility] Male, 74 y.o., October 17, 1945 MRN:  [id number] Phone:  [phone] Ryan Wilkerson) PCP:  Ryan Wilkerson, Ryan Wilkerson.Ryan Sa, MD Primary [facility] [place] Next Appt With Oncology 04/23/2020 at 1:15 PM  RE: US Liver Biopsy Received: Today Arne Cleveland, MD  [name]   [place] core R liver met    DDH       Previous Messages   ----- Message -----  From: Garth Bigness D  Sent: 04/10/2020  4:56 PM EDT  To: Ir Procedure Requests  Subject: US Liver Biopsy                  Procedure: US Liver Biopsy   Reason: Mesenteric mass, Likely metastatic carcinoid/neuroendocrine tumor, primary unknown   History:  NM Bone, NM PET in computer   Provider: Alla Feeling   Provider Contact: [phone]

## 2020-04-12 ENCOUNTER — Encounter (HOSPITAL_COMMUNITY): Payer: Self-pay | Admitting: Radiology

## 2020-04-12 NOTE — Progress Notes (Signed)
Ryan Wilkerson. [facility] Male, 74 y.o., 1946-07-01 MRN:  [id number] Phone:  [phone] Ryan Wilkerson) PCP:  Ryan Wilkerson, Carlean Jews.Marlou Sa, MD Primary [facility] Donning [place] Next Appt With Radiology ([facility] 1) 04/19/2020 at 12:30 PM  RE: US Liver Biopsy Received: Today Alla Feeling, NP  Adron Bene, MD That should be fine. He is on it for suspected stroke in 2020 per neuro.  Thanks,  [name]       Previous Messages   ----- Message -----  From: Garth Bigness D  Sent: 04/11/2020  1:08 PM EDT  To: Alla Feeling, NP  Subject: FW: US Liver Biopsy                Hi [name], called patient to get him scheduled and he states that he is on Plavix. I will need to know if that can be held for 5 days prior to his biopsy.  Thanks Aniceto Boss  ----- Message -----  From: Arne Cleveland, MD  Sent: 04/11/2020 11:17 AM EDT  To: Jillyn Hidden  Subject: RE: US Liver Biopsy                Ok   [place] core R liver met    DDH    ----- Message -----  From: Garth Bigness D  Sent: 04/10/2020  4:56 PM EDT  To: [facility] Procedure Requests  Subject: US Liver Biopsy                  Procedure: US Liver Biopsy   Reason: Mesenteric mass, Likely metastatic carcinoid/neuroendocrine tumor, primary unknown   History:  NM Bone, NM PET in computer   Provider: Alla Feeling   Provider Contact: [phone]

## 2020-04-18 ENCOUNTER — Other Ambulatory Visit: Payer: Self-pay

## 2020-04-18 ENCOUNTER — Other Ambulatory Visit: Payer: Self-pay | Admitting: Radiology

## 2020-04-19 ENCOUNTER — Ambulatory Visit (HOSPITAL_COMMUNITY)
Admission: RE | Admit: 2020-04-19 | Discharge: 2020-04-19 | Disposition: A | Discharge status: Home / Self Care | Payer: Medicare HMO | Source: Ambulatory Visit | Attending: Nurse Practitioner | Admitting: Nurse Practitioner

## 2020-04-19 ENCOUNTER — Other Ambulatory Visit: Payer: Self-pay

## 2020-04-19 DIAGNOSIS — K6389 Other specified diseases of intestine: Secondary | ICD-10-CM

## 2020-04-19 MED ORDER — SODIUM CHLORIDE 0.9 % IV SOLN: INTRAVENOUS | Status: DC

## 2020-04-19 NOTE — Progress Notes (Signed)
Liver biopsy cancelled as patient ate oatmeal at 8:30 this morning.  Patient ambulatory to lobby with no issues noted.

## 2020-04-20 NOTE — Progress Notes (Signed)
Patient calls stating his liver biopsy got rescheduled because he had eaten too late, it is now 8/30, I cancelled hi follow up for 8/23 and rescheduled for Thursday 9/2 at 3:15 with Cira Rue NP.

## 2020-04-23 ENCOUNTER — Inpatient Hospital Stay: Payer: Medicare HMO | Admitting: Nurse Practitioner

## 2020-04-25 DIAGNOSIS — I10 Essential (primary) hypertension: Secondary | ICD-10-CM | POA: Diagnosis not present

## 2020-04-25 DIAGNOSIS — R2689 Other abnormalities of gait and mobility: Secondary | ICD-10-CM | POA: Diagnosis not present

## 2020-04-25 DIAGNOSIS — J309 Allergic rhinitis, unspecified: Secondary | ICD-10-CM | POA: Diagnosis not present

## 2020-04-25 DIAGNOSIS — G250 Essential tremor: Secondary | ICD-10-CM | POA: Diagnosis not present

## 2020-04-25 DIAGNOSIS — E7800 Pure hypercholesterolemia, unspecified: Secondary | ICD-10-CM | POA: Diagnosis not present

## 2020-04-27 ENCOUNTER — Other Ambulatory Visit: Payer: Self-pay | Admitting: Radiology

## 2020-04-30 ENCOUNTER — Other Ambulatory Visit: Payer: Self-pay

## 2020-04-30 ENCOUNTER — Ambulatory Visit (HOSPITAL_COMMUNITY)
Admission: RE | Admit: 2020-04-30 | Discharge: 2020-04-30 | Disposition: A | Discharge status: Home / Self Care | Payer: No Typology Code available for payment source | Source: Ambulatory Visit | Attending: Family Medicine | Admitting: Family Medicine

## 2020-04-30 ENCOUNTER — Ambulatory Visit (HOSPITAL_COMMUNITY)
Admission: RE | Admit: 2020-04-30 | Discharge: 2020-04-30 | Disposition: A | Discharge status: Home / Self Care | Payer: Medicare HMO | Source: Ambulatory Visit | Attending: Nurse Practitioner | Admitting: Nurse Practitioner

## 2020-04-30 ENCOUNTER — Encounter (HOSPITAL_COMMUNITY): Payer: Self-pay

## 2020-04-30 DIAGNOSIS — Z8546 Personal history of malignant neoplasm of prostate: Secondary | ICD-10-CM | POA: Diagnosis not present

## 2020-04-30 DIAGNOSIS — Z79899 Other long term (current) drug therapy: Secondary | ICD-10-CM | POA: Insufficient documentation

## 2020-04-30 DIAGNOSIS — C7B8 Other secondary neuroendocrine tumors: Secondary | ICD-10-CM | POA: Diagnosis not present

## 2020-04-30 DIAGNOSIS — I10 Essential (primary) hypertension: Secondary | ICD-10-CM | POA: Insufficient documentation

## 2020-04-30 DIAGNOSIS — Z8042 Family history of malignant neoplasm of prostate: Secondary | ICD-10-CM | POA: Diagnosis not present

## 2020-04-30 DIAGNOSIS — E7800 Pure hypercholesterolemia, unspecified: Secondary | ICD-10-CM | POA: Insufficient documentation

## 2020-04-30 DIAGNOSIS — Z923 Personal history of irradiation: Secondary | ICD-10-CM | POA: Diagnosis not present

## 2020-04-30 DIAGNOSIS — C787 Secondary malignant neoplasm of liver and intrahepatic bile duct: Secondary | ICD-10-CM | POA: Diagnosis not present

## 2020-04-30 DIAGNOSIS — N529 Male erectile dysfunction, unspecified: Secondary | ICD-10-CM | POA: Diagnosis not present

## 2020-04-30 DIAGNOSIS — Z791 Long term (current) use of non-steroidal anti-inflammatories (NSAID): Secondary | ICD-10-CM | POA: Diagnosis not present

## 2020-04-30 DIAGNOSIS — R351 Nocturia: Secondary | ICD-10-CM | POA: Insufficient documentation

## 2020-04-30 DIAGNOSIS — Z87891 Personal history of nicotine dependence: Secondary | ICD-10-CM | POA: Diagnosis not present

## 2020-04-30 DIAGNOSIS — G250 Essential tremor: Secondary | ICD-10-CM | POA: Insufficient documentation

## 2020-04-30 DIAGNOSIS — K219 Gastro-esophageal reflux disease without esophagitis: Secondary | ICD-10-CM | POA: Diagnosis not present

## 2020-04-30 DIAGNOSIS — Z8673 Personal history of transient ischemic attack (TIA), and cerebral infarction without residual deficits: Secondary | ICD-10-CM | POA: Insufficient documentation

## 2020-04-30 DIAGNOSIS — K7689 Other specified diseases of liver: Secondary | ICD-10-CM | POA: Diagnosis not present

## 2020-04-30 DIAGNOSIS — C801 Malignant (primary) neoplasm, unspecified: Secondary | ICD-10-CM | POA: Diagnosis not present

## 2020-04-30 DIAGNOSIS — Z86010 Personal history of colonic polyps: Secondary | ICD-10-CM | POA: Diagnosis not present

## 2020-04-30 LAB — CBC
HCT: 39.2 % (ref 39.0–52.0)
Hemoglobin: 12.7 g/dL — ABNORMAL LOW (ref 13.0–17.0)
MCH: 30.2 pg (ref 26.0–34.0)
MCHC: 32.4 g/dL (ref 30.0–36.0)
MCV: 93.3 fL (ref 80.0–100.0)
Platelets: 210 10*3/uL (ref 150–400)
RBC: 4.2 MIL/uL — ABNORMAL LOW (ref 4.22–5.81)
RDW: 11.6 % (ref 11.5–15.5)
WBC: 6.5 10*3/uL (ref 4.0–10.5)
nRBC: 0 % (ref 0.0–0.2)

## 2020-04-30 LAB — PROTIME-INR
INR: 1 (ref 0.8–1.2)
Prothrombin Time: 12.7 seconds (ref 11.4–15.2)

## 2020-04-30 IMAGING — US US BIOPSY CORE LIVER
1 series · 13 of 15 positions shown · non-contrast
Comparison: PET-CT-[DATE];

INDICATION: Concern for metastatic neuroendocrine cancer. Please perform
ultrasound-guided liver lesion biopsy for tissue diagnostic
purposes.

EXAM:
ULTRASOUND GUIDED LIVER LESION BIOPSY

[Series 1: us biopsy core liver · 13 of 15 slices shown]
[im 1/15]
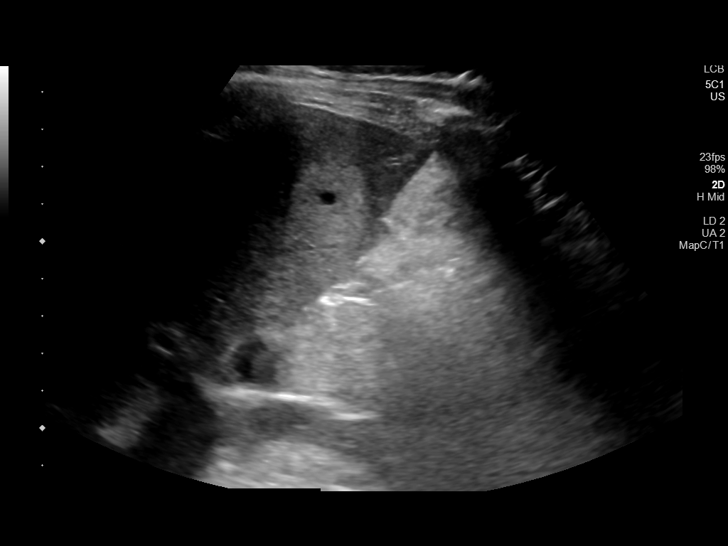
[im 2/15]
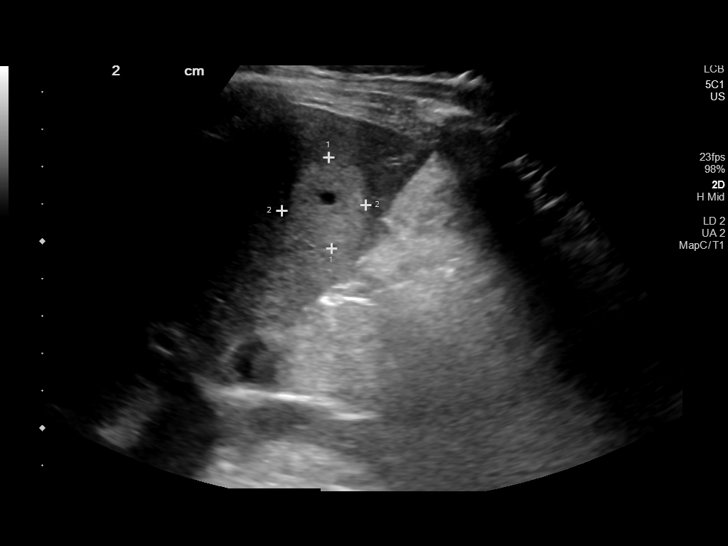
[im 3/15]
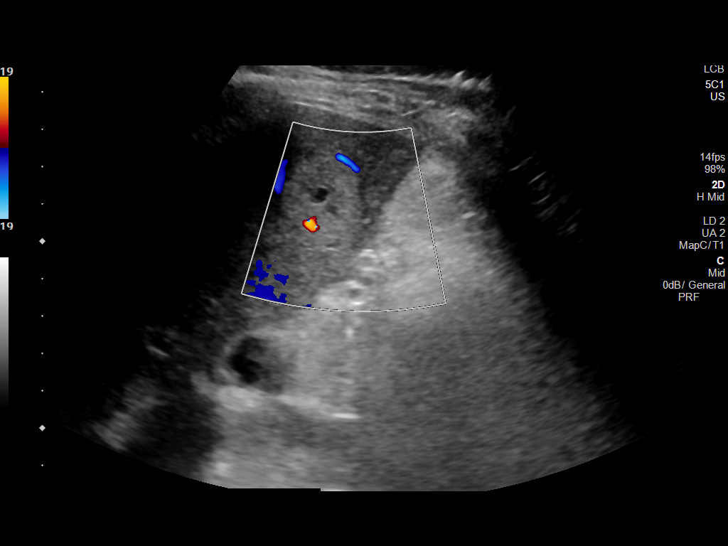
[im 5/15]
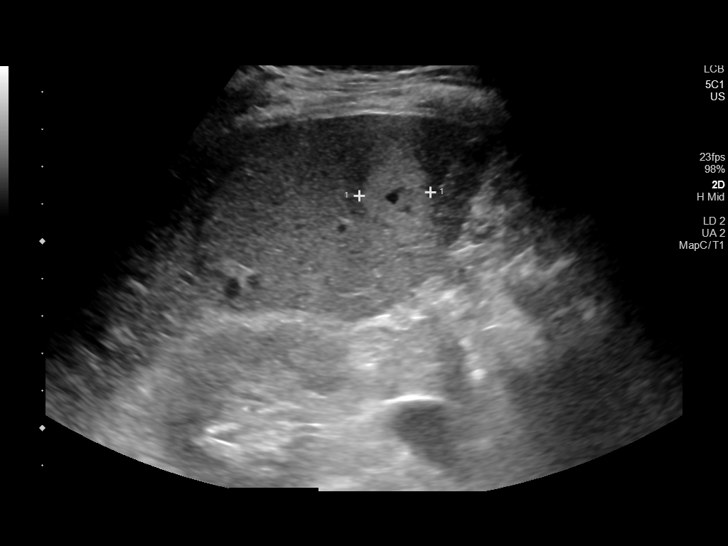
[im 6/15]
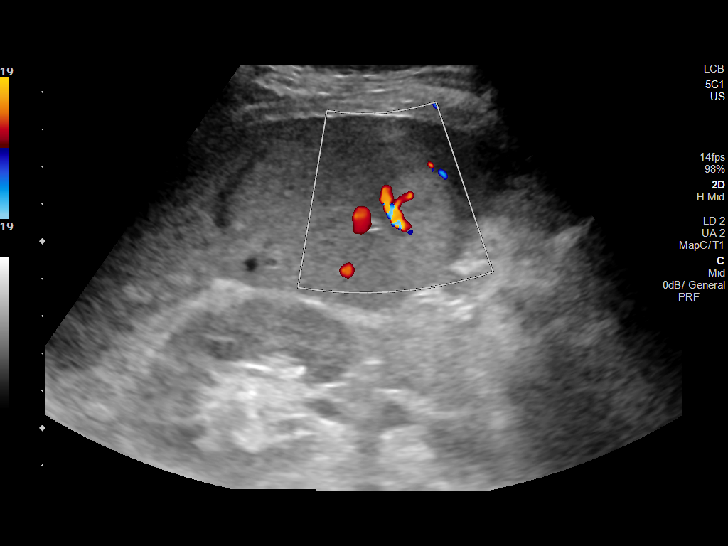
[im 7/15]
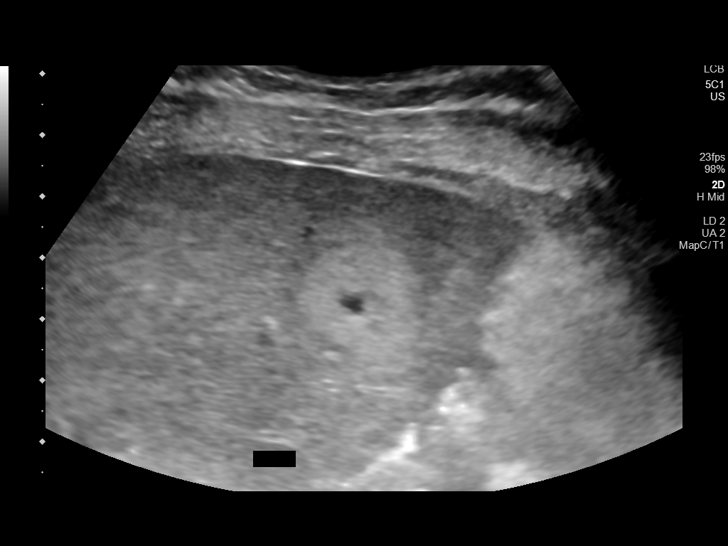
[im 8/15]
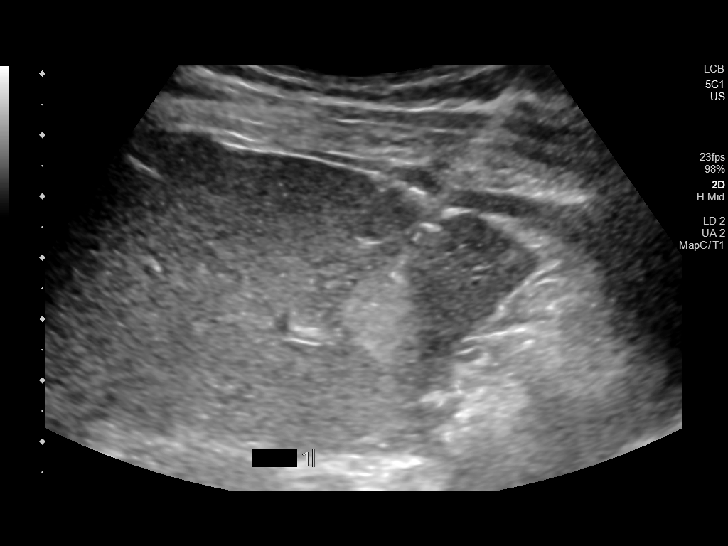
[im 9/15]
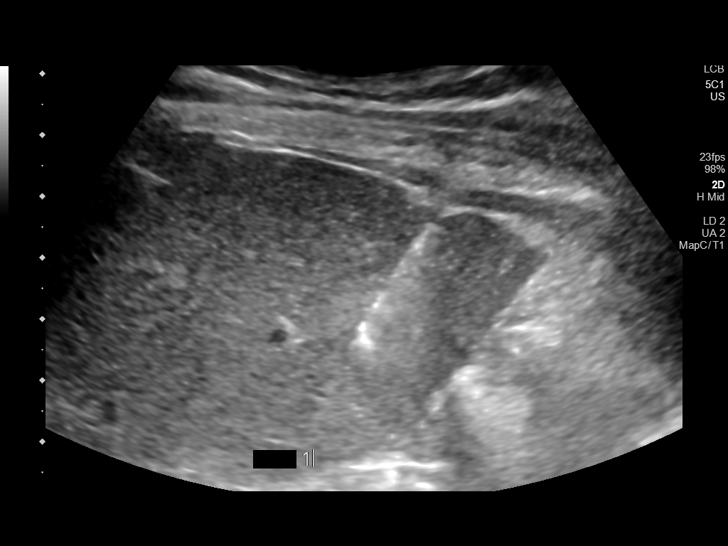
[im 10/15]
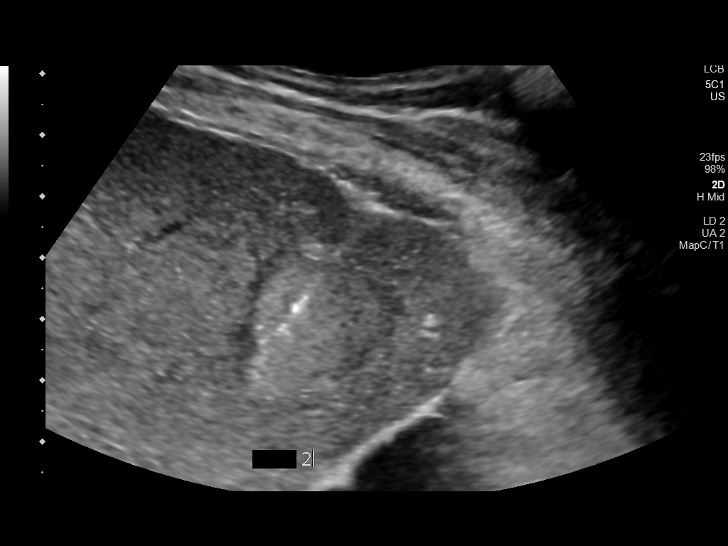
[im 11/15]
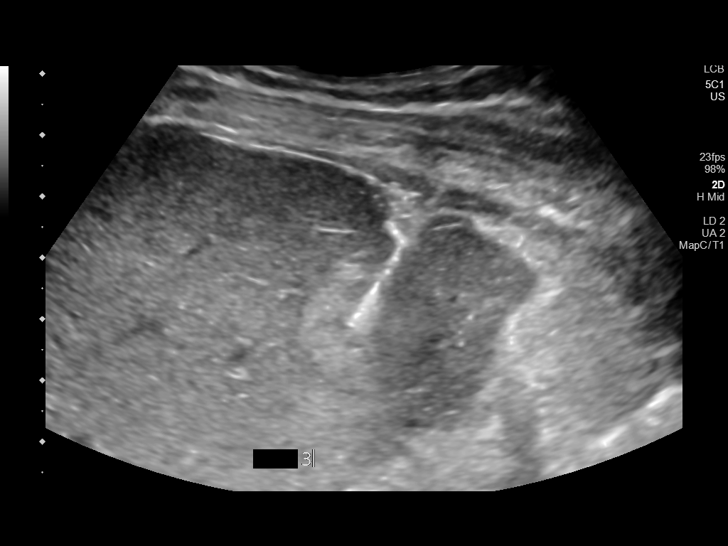
[im 13/15]
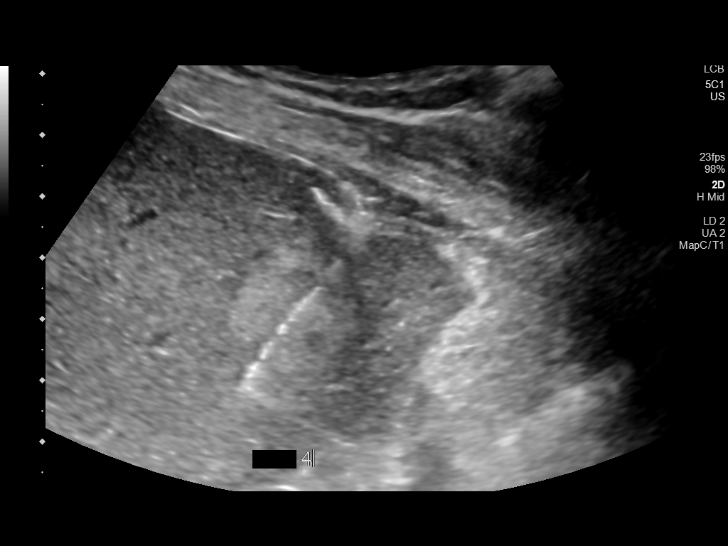
[im 14/15]
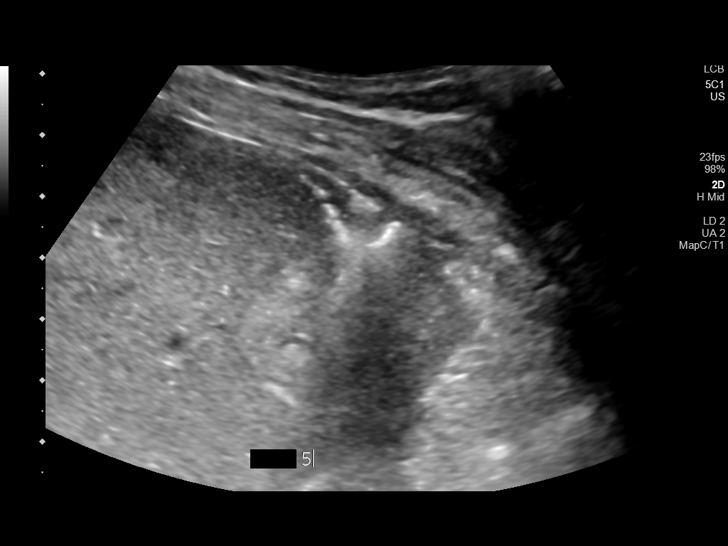
[im 15/15]
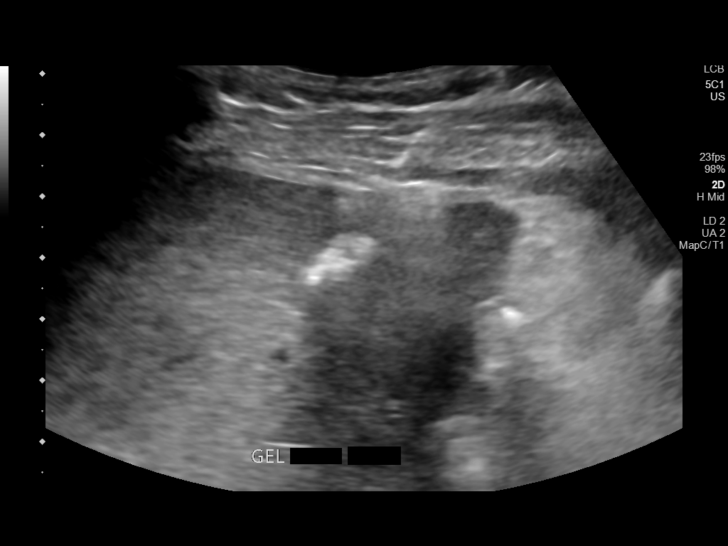

[13 of 15 positions shown; findings below may reference images not displayed]

CT abdomen and pelvis-[DATE]

MEDICATIONS:
None

ANESTHESIA/SEDATION:
Fentanyl 100 mcg IV; Versed 2 mg IV

Total Moderate Sedation time:  14 Minutes.

The patient's level of consciousness and vital signs were monitored
continuously by radiology nursing throughout the procedure under my
direct supervision.

COMPLICATIONS:
None immediate.

PROCEDURE:
Informed written consent was obtained from the patient after a
discussion of the risks, benefits and alternatives to treatment. The
patient understands and consents the procedure. A timeout was
performed prior to the initiation of the procedure.

Ultrasound scanning was performed of the right upper abdominal
quadrant demonstrates an approximately 2.1 x 1.8 cm hyperechoic
lesion within the caudal aspect the right lobe of the liver (image
8), correlating with the hypermetabolic lesion seen on preceding
PET-CT image 111, series 4.

The procedure was planned. The right upper abdominal quadrant was
prepped and draped in the usual sterile fashion. The overlying soft
tissues were anesthetized with 1% lidocaine with epinephrine. A 17
gauge, 6.8 cm co-axial needle was advanced into a peripheral aspect
of the lesion. This was followed by 5 core biopsies with an 18 gauge
core device under direct ultrasound guidance.

The coaxial needle tract was embolized with a small amount of
Gel-Foam slurry and superficial hemostasis was obtained with manual
compression. Post procedural scanning was negative for definitive
area of hemorrhage or additional complication. A dressing was
placed. The patient tolerated the procedure well without immediate
post procedural complication.
IMPRESSION: Technically successful ultrasound guided core needle biopsy of
indeterminate 2.1 cm lesion within the caudal aspect of the right
lobe of the liver.

## 2020-04-30 MED ORDER — GELATIN ABSORBABLE 12-7 MM EX MISC
CUTANEOUS | Status: AC
  Filled 2020-04-30: qty 1

## 2020-04-30 MED ORDER — FENTANYL CITRATE (PF) 100 MCG/2ML IJ SOLN
INTRAMUSCULAR | Status: AC | PRN
Start: 2020-04-30 — End: 2020-04-30
  Administered 2020-04-30 (×2): 50 ug via INTRAVENOUS

## 2020-04-30 MED ORDER — MIDAZOLAM HCL 2 MG/2ML IJ SOLN
INTRAMUSCULAR | Status: AC | PRN
  Administered 2020-04-30 (×2): 1 mg via INTRAVENOUS

## 2020-04-30 MED ORDER — LIDOCAINE HCL (PF) 1 % IJ SOLN
INTRAMUSCULAR | Status: AC | PRN
  Administered 2020-04-30: 10 mL via INTRADERMAL

## 2020-04-30 MED ORDER — LIDOCAINE-EPINEPHRINE (PF) 2 %-1:200000 IJ SOLN
INTRAMUSCULAR | Status: AC
  Filled 2020-04-30: qty 20

## 2020-04-30 MED ORDER — SODIUM CHLORIDE 0.9 % IV SOLN: INTRAVENOUS | Status: DC

## 2020-04-30 MED ORDER — MIDAZOLAM HCL 2 MG/2ML IJ SOLN
INTRAMUSCULAR | Status: AC
  Filled 2020-04-30: qty 2

## 2020-04-30 MED ORDER — FENTANYL CITRATE (PF) 100 MCG/2ML IJ SOLN
INTRAMUSCULAR | Status: AC
  Filled 2020-04-30: qty 2

## 2020-04-30 NOTE — Procedures (Signed)
Pre Procedure Dx: Liver lesion Post Procedural Dx: Same  Technically successful US guided biopsy of indeterminate lesion within the caudal aspect of the right lobe of the liver.   EBL: None  No immediate complications.   Ronny Bacon, MD Pager #: [phone]

## 2020-04-30 NOTE — Discharge Instructions (Signed)
Liver Biopsy, Care After These instructions give you information on caring for yourself after your procedure. Your doctor may also give you more specific instructions. Call your doctor if you have any problems or questions after your procedure. What can I expect after the procedure? After the procedure, it is common to have:  Pain and soreness where the biopsy was done.  Bruising around the area where the biopsy was done.  Sleepiness and be tired for a few days. Follow these instructions at home: Medicines  Take over-the-counter and prescription medicines only as told by your doctor.  If you were prescribed an antibiotic medicine, take it as told by your doctor. Do not stop taking the antibiotic even if you start to feel better.  Do not take medicines such as aspirin and ibuprofen. These medicines can thin your blood. Do not take these medicines unless your doctor tells you to take them.  If you are taking prescription pain medicine, take actions to prevent or treat constipation. Your doctor may recommend that you: ? Drink enough fluid to keep your pee (urine) clear or pale yellow. ? Take over-the-counter or prescription medicines. ? Eat foods that are high in fiber, such as fresh fruits and vegetables, whole grains, and beans. ? Limit foods that are high in fat and processed sugars, such as fried and sweet foods. Caring for your cut  Follow instructions from your doctor about how to take care of your cuts from surgery (incisions). Make sure you: ? Wash your hands with soap and water before you change your bandage (dressing). If you cannot use soap and water, use hand sanitizer. ? Change your bandage as told by your doctor. ? Leave stitches (sutures), skin glue, or skin tape (adhesive) strips in place. They may need to stay in place for 2 weeks or longer. If tape strips get loose and curl up, you may trim the loose edges. Do not remove tape strips completely unless your doctor says it is  okay.  Check your cuts every day for signs of infection. Check for: ? Redness, swelling, or more pain. ? Fluid or blood. ? Pus or a bad smell. ? Warmth.  Do not take baths, swim, or use a hot tub until your doctor says it is okay to do so. Activity   Rest at home for 1-2 days or as told by your doctor. ? Avoid sitting for a long time without moving. Get up to take short walks every 1-2 hours.  Return to your normal activities as told by your doctor. Ask what activities are safe for you.  Do not do these things in the first 24 hours: ? Drive. ? Use machinery. ? Take a bath or shower.  Do not lift more than 10 pounds (4.5 kg) or play contact sports for the first 2 weeks. General instructions   Do not drink alcohol in the first week after the procedure.  Have someone stay with you for at least 24 hours after the procedure.  Get your test results. Ask your doctor or the department that is doing the test: ? When will my results be ready? ? How will I get my results? ? What are my treatment options? ? What other tests do I need? ? What are my next steps?  Keep all follow-up visits as told by your doctor. This is important. Contact a doctor if:  A cut bleeds and leaves more than just a small spot of blood.  A cut is red, puffs up (  swells), or hurts more than before.  Fluid or something else comes from a cut.  A cut smells bad.  You have a fever or chills. Get help right away if:  You have swelling, bloating, or pain in your belly (abdomen).  You get dizzy or faint.  You have a rash.  You feel sick to your stomach (nauseous) or throw up (vomit).  You have trouble breathing, feel short of breath, or feel faint.  Your chest hurts.  You have problems talking or seeing.  You have trouble with your balance or moving your arms or legs. Summary  After the procedure, it is common to have pain, soreness, bruising, and tiredness.  Your doctor will tell you how to  take care of yourself at home. Change your bandage, take your medicines, and limit your activities as told by your doctor.  Call your doctor if you have symptoms of infection. Get help right away if your belly swells, your cut bleeds a lot, or you have trouble talking or breathing. This information is not intended to replace advice given to you by your health care provider. Make sure you discuss any questions you have with your health care provider. Document Revised: 08/28/2017 Document Reviewed: 08/28/2017 Elsevier Patient Education  2020 Elsevier Inc. Moderate Conscious Sedation, Adult, Care After These instructions provide you with information about caring for yourself after your procedure. Your health care provider may also give you more specific instructions. Your treatment has been planned according to current medical practices, but problems sometimes occur. Call your health care provider if you have any problems or questions after your procedure. What can I expect after the procedure? After your procedure, it is common:  To feel sleepy for several hours.  To feel clumsy and have poor balance for several hours.  To have poor judgment for several hours.  To vomit if you eat too soon. Follow these instructions at home: For at least 24 hours after the procedure:   Do not: ? Participate in activities where you could fall or become injured. ? Drive. ? Use heavy machinery. ? Drink alcohol. ? Take sleeping pills or medicines that cause drowsiness. ? Make important decisions or sign legal documents. ? Take care of children on your own.  Rest. Eating and drinking  Follow the diet recommended by your health care provider.  If you vomit: ? Drink water, juice, or soup when you can drink without vomiting. ? Make sure you have little or no nausea before eating solid foods. General instructions  Have a responsible adult stay with you until you are awake and alert.  Take  over-the-counter and prescription medicines only as told by your health care provider.  If you smoke, do not smoke without supervision.  Keep all follow-up visits as told by your health care provider. This is important. Contact a health care provider if:  You keep feeling nauseous or you keep vomiting.  You feel light-headed.  You develop a rash.  You have a fever. Get help right away if:  You have trouble breathing. This information is not intended to replace advice given to you by your health care provider. Make sure you discuss any questions you have with your health care provider. Document Revised: 07/31/2017 Document Reviewed: 12/08/2015 Elsevier Patient Education  2020 Elsevier Inc.  

## 2020-04-30 NOTE — H&P (Signed)
Chief Complaint: Patient was seen in consultation today for liver lesion biopsy   Referring Physician(s): Alla Feeling  Supervising Physician: Sandi Mariscal  Patient Status: [place]  History of Present Illness: Ryan Wilkerson is a 74 y.o. male with a medical history significant for HTN, stroke and prostate cancer s/p external radiation in 2019. Due to rising PSA levels in June 2021 he underwent restaging PET scan 04/10/20 which showed a "2.5 x 2.0 x 3.0 cm soft tissue lesion in the central small bowel mesentery with associated dystrophic calcification and retraction of adjacent small bowel loops with an upper normal 9 mm short axis central mesenteric lymph node.  Additionally a 1.6 x 1.4 cm poorly defined lesion in the inferior right liver is suspicious"  Interventional Radiology has been asked to evaluate this patient for a liver lesion biopsy. This case has been reviewed and procedure approved by Dr. Vernard Gambles.   Past Medical History:  Diagnosis Date  . Erectile dysfunction   . Essential tremor   . Family history of breast cancer   . Family history of prostate cancer   . Family history of stomach cancer   . GERD (gastroesophageal reflux disease)   . History of colon polyps   . Hypercholesterolemia   . Hypertension   . Nocturia   . Prostate cancer [facility]) urologist-- dr Tresa Moore /  oncologist-- dr manning   prostate bx 06-24-2016 and 09-15-2017 at dr Tresa Moore office  Stage T1c, Gleason 4+3, PSA 8.48, vol 33cc---  planned external beam radiation therpay  . Urgency of urination   . Wears glasses     Past Surgical History:  Procedure Laterality Date  . COLONOSCOPY  last one 06/ 2018  . GOLD SEED IMPLANT N/A 12/23/2017   Procedure: GOLD SEED IMPLANT;  Surgeon: Alexis Frock, MD;  Location: [facility];  Service: Urology;  Laterality: N/A;  . PROSTATE BIOPSY  06-24-2016;  09-15-2017-- at dr Tresa Moore office  . SPACE OAR INSTILLATION N/A 12/23/2017   Procedure: SPACE  OAR INSTILLATION;  Surgeon: Alexis Frock, MD;  Location: [facility];  Service: Urology;  Laterality: N/A;    Allergies: Aspirin and Simvastatin  Medications: Prior to Admission medications   Medication Sig Start Date End Date Taking? Authorizing Provider  fluticasone (FLONASE) 50 MCG/ACT nasal spray Place 2 sprays into both nostrils daily. 03/18/19  Yes Fawze, Mina A, PA-C  levETIRAcetam (KEPPRA) 250 MG tablet Take 250 mg by mouth 3 (three) times daily. Take 125 mg( 1/2 tab) po TID x 3 weeks, increase to 250 mg(1 tab) po TID for 3 weeks, increase to 375 mg TID 03/20/20  Yes [provider]  lisinopril-hydrochlorothiazide (PRINZIDE,ZESTORETIC) 20-12.5 MG tablet Take 1 tablet by mouth every morning.    Yes [provider]  loratadine (CLARITIN) 10 MG tablet Take 10 mg by mouth every morning.    Yes [provider]  Melatonin 5 MG TABS Take 5 mg by mouth at bedtime.   Yes [provider]  pravastatin (PRAVACHOL) 40 MG tablet Take 40 mg by mouth every morning.    Yes [provider]  tamsulosin (FLOMAX) 0.4 MG CAPS capsule Take 0.4 mg by mouth at bedtime. 12/29/18  Yes [provider]  acetaminophen (TYLENOL) 500 MG tablet Take 1,000 mg by mouth every 6 (six) hours as needed for headache (pain).    [provider]  diclofenac (VOLTAREN) 75 MG EC tablet  03/14/20   [provider]  meclizine (ANTIVERT) 25  MG tablet Take 25 mg by mouth 4 (four) times daily as needed for dizziness.  03/17/19   [provider]  OVER THE COUNTER MEDICATION Place 1 drop into both eyes daily as needed (dry eyes). Over the counter lubricating eye drop    [provider]     Family History  Problem Relation Age of Onset  . Prostate cancer Brother 24  . Prostate cancer Brother        metastatic/ late treatment dx in 60's/70's  . Breast cancer Sister 48  . Prostate cancer Brother        dx in 60's/70's  . Prostate  cancer Brother        dx 60's/70's  . Prostate cancer Brother        dx 60's/70's  . Prostate cancer Brother        44's  . Lung cancer Brother   . Prostate cancer Other   . Prostate cancer Other   . Lung cancer Sister     Social History   Socioeconomic History  . Marital status: Married    Spouse name: Not on file  . Number of children: 4  . Years of education: Not on file  . Highest education level: Not on file  Occupational History  . Occupation: Retired    Comment: Automotive engineer  Tobacco Use  . Smoking status: Former Smoker    Packs/day: 0.50    Years: 18.00    Pack years: 9.00    Types: Cigarettes    Quit date: 05/02/1974    Years since quitting: 46.0  . Smokeless tobacco: Never Used  Vaping Use  . Vaping Use: Never used  Substance and Sexual Activity  . Alcohol use: No  . Drug use: No  . Sexual activity: Yes  Other Topics Concern  . Not on file  Social History Narrative   Married with 3 daughters, 1 son. 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   Social Determinants of Health   Financial Resource Strain:   . Difficulty of Paying Living Expenses: Not on file  Food Insecurity:   . Worried About Charity fundraiser in the Last Year: Not on file  . Ran Out of Food in the Last Year: Not on file  Transportation Needs:   . Lack of Transportation (Medical): Not on file  . Lack of Transportation (Non-Medical): Not on file  Physical Activity:   . Days of Exercise per Week: Not on file  . Minutes of Exercise per Session: Not on file  Stress:   . Feeling of Stress : Not on file  Social Connections:   . Frequency of Communication with Friends and Family: Not on file  . Frequency of Social Gatherings with Friends and Family: Not on file  . Attends Religious Services: Not on file  . Active Member of Clubs or Organizations: Not on file  . Attends Archivist  Meetings: Not on file  . Marital Status: Not on file    Review of Systems: A 12 point ROS discussed and pertinent positives are indicated in the HPI above.  All other systems are negative.  Review of Systems  Constitutional: Positive for appetite change and fatigue.  Respiratory: Negative for cough and shortness of breath.   Cardiovascular: Negative for chest pain and leg swelling.  Gastrointestinal: Positive for abdominal pain and constipation. Negative for diarrhea, nausea and vomiting.  Epigastric/mid-abdominal pain.   Musculoskeletal: Negative for back pain.  Neurological: Positive for dizziness. Negative for headaches.    Vital Signs: BP (!) 154/69 (BP Location: Right Arm)   Pulse 82   Temp 98.2 F (36.8 C) (Oral)   Resp 15   SpO2 100%   Physical Exam Constitutional:      General: He is not in acute distress. HENT:     Mouth/Throat:     Mouth: Mucous membranes are moist.     Pharynx: Oropharynx is clear.  Cardiovascular:     Rate and Rhythm: Normal rate and regular rhythm.     Pulses: Normal pulses.     Heart sounds: Normal heart sounds.  Pulmonary:     Effort: Pulmonary effort is normal.     Breath sounds: Normal breath sounds.  Abdominal:     General: Abdomen is flat.     Palpations: Abdomen is soft.     Tenderness: There is abdominal tenderness.     Comments: Epigastric/mid-abdominal  Skin:    General: Skin is warm.  Neurological:     Mental Status: He is alert and oriented to person, place, and time.     Imaging: NM PET (NETSPOT GA 69 DOTATATE) SKULL BASE TO MID THIGH  Result Date: 04/10/2020 CLINICAL DATA:  Suspicious mesenteric mass liver lesion. Concern for carcinoid tumor. EXAM: NUCLEAR MEDICINE PET SKULL BASE TO THIGH TECHNIQUE: 3.6 mCi Ga 78 DOTATATE was injected intravenously. Full-ring PET imaging was performed from the skull base to thigh after the radiotracer. CT data was obtained and used for attenuation correction and anatomic  localization. COMPARISON:  CT 03/01/2020 FINDINGS: NECK No radiotracer activity in neck lymph nodes. Incidental CT findings: None CHEST No radiotracer accumulation within mediastinal or hilar lymph nodes. No suspicious pulmonary nodules on the CT scan. Single focus of radiotracer activity is noted in the LEFT pericardial space. 6 mm nodules position between the pericardium and LEFT ventricle on image 78/4. This nodule has associated radiotracer activity SUV max equal 10 point 9. Incidental CT finding:None ABDOMEN/PELVIS Partially calcified central mesenteric mass measuring 2 cm (image 39/2) and has intense radiotracer activity with SUV max equal 89. Smaller mesenteric nodule just superior measuring 9 mm (image 121/4 )also has intense radiotracer activity. There are 3 discrete lesions within liver with intense radiotracer activity. Largest is in the inferior RIGHT hepatic lobe measuring 2 cm SUV max equal 59. A smaller subcapsular lesion in the anterior central LEFT hepatic lobe with SUV max equal 19.5. Lesion in the dome of the RIGHT hepatic lobe with SUV max equal 24. No focal activity associated with the within the small bowel to localize a primary. A tiny focus of relatively mild activity (but elevated above background pancreas) is noted in the body the pancreas with SUV max equal 12.8. Image 99 fused data set) There several metastatic implants along the LEFT iliac vessels. For example small lesion measuring 5 mm image 159/4 with SUV max equal 29.5. Physiologic activity noted in the liver, spleen, adrenal glands and kidneys. Incidental CT findings:None SKELETON No evidence skeletal metastasis. Incidental CT findings:None IMPRESSION: 1. Central mesenteric mass with intense radiotracer activity consistent well differentiated neuroendocrine tumor 2. 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 3. Small peritoneal/nodal metastasis along the LEFT iliac vessels. 4. Very small lesion in the  pancreas with intermediate activity could represent primary lesion. No primary bowel lesion identified. 5. Small metastatic implant within the pericardium adjacent LEFT heart ventricle. 6. No skeletal metastasis. Electronically Signed  By: Suzy Bouchard M.D.   On: 04/10/2020 08:53    Labs:  CBC: Recent Labs    04/02/20 1111 04/30/20 1145  WBC 6.0 6.5  HGB 13.0 12.7*  HCT 40.5 39.2  PLT 216 210    COAGS: No results for input(s): INR, APTT in the last 8760 hours.  BMP: Recent Labs    04/02/20 1111  NA 139  K 4.1  CL 103  CO2 28  GLUCOSE 88  BUN 12  CALCIUM 9.9  CREATININE 1.16  GFRNONAA >60  GFRAA >60    LIVER FUNCTION TESTS: Recent Labs    04/02/20 1111  BILITOT 0.7  AST 22  ALT 37  ALKPHOS 65  PROT 7.7  ALBUMIN 4.0    TUMOR MARKERS: No results for input(s): AFPTM, CEA, CA199, CHROMGRNA in the last 8760 hours.  Assessment and Plan:  Mesenteric mass; liver lesion: Ryan Wilkerson, 74 year old male, presents today to the [facility] department for an image-guided liver lesion biopsy. He has a history of prostate cancer and recent imaging shows new masses suspicious for metastases.   Risks and benefits of a liver lesion biopsy were discussed with the patient family including, but not limited to bleeding, infection, damage to adjacent structures or low yield requiring additional tests.  All of the questions were answered and there is agreement to proceed.  He has been NPO. Labs and vitals have been reviewed. He has held his plavix for 5 days.  Consent signed and in chart.  Thank you for this interesting consult.  I greatly enjoyed meeting Ryan Wilkerson and look forward to participating in their care.  A copy of this report was sent to the requesting provider on this date.  Electronically Signed: Soyla Dryer, AGACNP-BC [phone] 04/30/2020, 11:58 AM   I spent a total of  30 Minutes   in face to face in clinical  consultation, greater than 50% of which was counseling/coordinating care for liver lesion biopsy.

## 2020-05-01 ENCOUNTER — Other Ambulatory Visit: Payer: Self-pay | Admitting: Hematology

## 2020-05-01 DIAGNOSIS — C7B8 Other secondary neuroendocrine tumors: Secondary | ICD-10-CM

## 2020-05-01 DIAGNOSIS — Z7189 Other specified counseling: Secondary | ICD-10-CM

## 2020-05-02 LAB — SURGICAL PATHOLOGY

## 2020-05-03 ENCOUNTER — Other Ambulatory Visit: Payer: Self-pay

## 2020-05-03 ENCOUNTER — Encounter: Payer: Self-pay | Admitting: Nurse Practitioner

## 2020-05-03 ENCOUNTER — Inpatient Hospital Stay: Payer: Medicare HMO | Attending: Nurse Practitioner | Admitting: Nurse Practitioner

## 2020-05-03 ENCOUNTER — Telehealth: Payer: Self-pay | Admitting: Nurse Practitioner

## 2020-05-03 VITALS — BP 136/68 | HR 87 | Temp 96.4°F | Resp 18 | Ht 68.0 in | Wt 158.4 lb

## 2020-05-03 DIAGNOSIS — Z801 Family history of malignant neoplasm of trachea, bronchus and lung: Secondary | ICD-10-CM | POA: Diagnosis not present

## 2020-05-03 DIAGNOSIS — G250 Essential tremor: Secondary | ICD-10-CM | POA: Diagnosis not present

## 2020-05-03 DIAGNOSIS — Z8042 Family history of malignant neoplasm of prostate: Secondary | ICD-10-CM | POA: Diagnosis not present

## 2020-05-03 DIAGNOSIS — J45909 Unspecified asthma, uncomplicated: Secondary | ICD-10-CM | POA: Diagnosis not present

## 2020-05-03 DIAGNOSIS — C7A00 Malignant carcinoid tumor of unspecified site: Secondary | ICD-10-CM | POA: Diagnosis not present

## 2020-05-03 DIAGNOSIS — Z79899 Other long term (current) drug therapy: Secondary | ICD-10-CM | POA: Insufficient documentation

## 2020-05-03 DIAGNOSIS — C7B8 Other secondary neuroendocrine tumors: Secondary | ICD-10-CM | POA: Diagnosis not present

## 2020-05-03 DIAGNOSIS — I10 Essential (primary) hypertension: Secondary | ICD-10-CM | POA: Diagnosis not present

## 2020-05-03 DIAGNOSIS — Z800 Family history of malignant neoplasm of digestive organs: Secondary | ICD-10-CM | POA: Insufficient documentation

## 2020-05-03 DIAGNOSIS — Z8546 Personal history of malignant neoplasm of prostate: Secondary | ICD-10-CM | POA: Diagnosis not present

## 2020-05-03 DIAGNOSIS — C7B02 Secondary carcinoid tumors of liver: Secondary | ICD-10-CM | POA: Diagnosis not present

## 2020-05-03 DIAGNOSIS — Z803 Family history of malignant neoplasm of breast: Secondary | ICD-10-CM | POA: Insufficient documentation

## 2020-05-03 DIAGNOSIS — Z87891 Personal history of nicotine dependence: Secondary | ICD-10-CM | POA: Insufficient documentation

## 2020-05-03 NOTE — Progress Notes (Addendum)
[place]   [phone] [phone]) [phone]   Clinic Follow up Note   Patient Care Team: Alroy Dust, L.Marlou Sa, MD as PCP - General (Family Medicine) Jonnie Finner, RN as Oncology Nurse Navigator Truitt Merle, MD as Consulting Physician (Oncology) Alla Feeling, NP as Nurse Practitioner (Nurse Practitioner) Alexis Frock, MD as Consulting Physician (Urology) Tyler Pita, MD as Consulting Physician (Radiation Oncology) 05/03/2020  CHIEF COMPLAINT: Follow-up metastatic neuroendocrine tumor  SUMMARY OF ONCOLOGIC HISTORY: Oncology History  Malignant neoplasm of prostate ([place])  08/31/2017 Initial Diagnosis   Malignant neoplasm of prostate ([place])   10/28/2017 Genetic Testing   The patient had genetic testing due to a personal history of prostate cancer and a family history of breast and stomach cancer.  The Common Hereditary Cancers Panel + Prostate Cancer Panel was ordered.  APC, ATM, AXIN2, BARD1, BMPR1A, BRCA1, BRCA2, BRIP1, CDH1, CDK4, CDKN2A (p14ARF), CDKN2A (p16INK4a), CHEK2, CTNNA1, DICER1, EPCAM*, FANCA, GREM1*, KIT, MEN1, MLH1, MSH2, MSH3, MSH6, MUTYH, NBN, NF1, PALB2, PDGFRA, PMS2, POLD1, POLE, PTEN, RAD50, RAD51C, RAD51D, SDHB, SDHC, SDHD, SMAD4, SMARCA4, STK11, TP53, TSC1, TSC2, VHL. The following genes were evaluated for sequence changes only: HOXB13*, NTHL1*, SDHA.   Results: Negative, no pathogenic variants identified. The date of this test report is 10/28/2017.    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 2.5 x 2.0 x 3.0 cm soft tissue lesion in the central small bowel mesentery with associated dystrophic calcification and retraction of adjacent small bowel loops features highly suspicious for metastatic carcinoid tumor.  Upper normal 9 mm short axis lymph node in the adjacent mesentery 2. 2.  1.6 x 1.4 cm heterogeneous, poorly defined lesion in the inferior right liver suspicious for metastatic  disease, the differential includes GI primary or metastatic prostate cancer 3. No retroperitoneal or pelvic sidewall lymphadenopathy   04/01/2020 PET scan   IMPRESSION: 1. Central mesenteric mass with intense radiotracer activity consistent well differentiated neuroendocrine tumor 2. 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 3. Small peritoneal/nodal metastasis along the LEFT iliac vessels. 4. Very small lesion in the pancreas with intermediate activity could represent primary lesion. No primary bowel lesion identified. 5. Small metastatic implant within the pericardium adjacent LEFT heart ventricle. 6. No skeletal metastasis.   04/30/2020 Pathology Results   FINAL MICROSCOPIC DIAGNOSIS:  A. 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  Tumor cells are negative  for TTF-1.  The findings are consistent with neuroendocrine tumor of  gastrointestinal origin.  Ki-67 stain shows a proliferative index of  about 1%, consistent with well-differentiated, grade 1 tumor.    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  He tolerated the liver biopsy with some soreness at the site that resolved after a few days.  He denies other pain or diarrhea.  He has mild flushing.  Energy and appetite are adequate, he remains active throughout the day.  Denies recent fever, chills, cough, chest pain, thrombosis.  He notes he had an MRI at the [place] recently that showed no signs of mini stroke or identified the  source of his initial stroke last year.  He is  back on Plavix.   MEDICAL HISTORY:  Past Medical History:  Diagnosis Date  . Erectile dysfunction   . Essential tremor   . Family history of breast cancer   . Family history of prostate cancer   . Family history of stomach cancer   . GERD (gastroesophageal reflux disease)   . History of colon polyps   . Hypercholesterolemia   . Hypertension   . Nocturia   . Prostate cancer [facility]) urologist-- dr Tresa Moore /  oncologist-- dr manning   prostate bx 06-24-2016 and 09-15-2017 at dr Tresa Moore office  Stage T1c, Gleason 4+3, PSA 8.48, vol 33cc---  planned external beam radiation therpay  . Urgency of urination   . Wears glasses     SURGICAL HISTORY: Past Surgical History:  Procedure Laterality Date  . COLONOSCOPY  last one 06/ 2018  . GOLD SEED IMPLANT N/A 12/23/2017   Procedure: GOLD SEED IMPLANT;  Surgeon: Alexis Frock, MD;  Location: [facility];  Service: Urology;  Laterality: N/A;  . PROSTATE BIOPSY  06-24-2016;  09-15-2017-- at dr Tresa Moore office  . SPACE OAR INSTILLATION N/A 12/23/2017   Procedure: SPACE OAR INSTILLATION;  Surgeon: Alexis Frock, MD;  Location: [facility];  Service: Urology;  Laterality: N/A;    I have reviewed the social history and family history with the patient and they are unchanged from previous note.  ALLERGIES:  is allergic to aspirin and simvastatin.  MEDICATIONS:  Current Outpatient Medications  Medication Sig Dispense Refill  . acetaminophen (TYLENOL) 500 MG tablet Take 1,000 mg by mouth every 6 (six) hours as needed for headache (pain).    Marland Kitchen diclofenac (VOLTAREN) 75 MG EC tablet     . fluticasone (FLONASE) 50 MCG/ACT nasal spray Place 2 sprays into both nostrils daily. 16 g 0  . levETIRAcetam (KEPPRA) 250 MG tablet Take 250 mg by mouth 3 (three) times daily. Take 125 mg( 1/2 tab) po TID x 3 weeks, increase to 250 mg(1 tab) po TID for 3 weeks, increase to 375 mg TID    . lisinopril-hydrochlorothiazide (PRINZIDE,ZESTORETIC)  20-12.5 MG tablet Take 1 tablet by mouth every morning.     . loratadine (CLARITIN) 10 MG tablet Take 10 mg by mouth every morning.     . meclizine (ANTIVERT) 25 MG tablet Take 25 mg by mouth 4 (four) times daily as needed for dizziness.     . Melatonin 5 MG TABS Take 5 mg by mouth at bedtime.    Marland Kitchen OVER THE COUNTER MEDICATION Place 1 drop into both eyes daily as needed (dry eyes). Over the counter lubricating eye drop    . pravastatin (PRAVACHOL) 40 MG tablet Take 40 mg by mouth every morning.     . tamsulosin (FLOMAX) 0.4 MG CAPS capsule Take 0.4 mg by mouth at bedtime.     No current facility-administered medications for this visit.    PHYSICAL EXAMINATION: ECOG PERFORMANCE STATUS: 0 - Asymptomatic  Vitals:   05/03/20 1513  BP: 136/68  Pulse: 87  Resp: 18  Temp: (!) 96.4 F (35.8 C)  SpO2: 100%   Filed Weights   05/03/20 1513  Weight: 158 lb 6.4 oz (71.8 kg)    GENERAL:alert, no distress and comfortable SKIN: No rash to exposed skin EYES:  sclera clear NECK: Without mass LYMPH:  no palpable cervical or supraclavicular lymphadenopathy  LUNGS: clear with normal breathing effort HEART: regular rate & rhythm, no lower  extremity edema ABDOMEN:abdomen soft, non-tender and normal bowel sounds, no hepatomegaly or palpable mass NEURO: alert & oriented x 3 with fluent speech, normal gait  LABORATORY DATA:  I have reviewed the data as listed CBC Latest Ref Rng & Units 04/30/2020 04/02/2020 03/18/2019  WBC 4.0 - 10.5 K/uL 6.5 6.0 -  Hemoglobin 13.0 - 17.0 g/dL 12.7(L) 13.0 14.6  Hematocrit 39 - 52 % 39.2 40.5 43.0  Platelets 150 - 400 K/uL 210 216 -     CMP Latest Ref Rng & Units 04/02/2020 03/18/2019 03/18/2019  Glucose 70 - 99 mg/dL 88 105(H) 107(H)  BUN 8 - 23 mg/dL _0 Creatinine 0.61 - 1.24 mg/dL 1.16 1.20 1.19  Sodium 135 - 145 mmol/L 139 139 139  Potassium 3.5 - 5.1 mmol/L 4.1 3.9 3.9  Chloride 98 - 111 mmol/L 103 102 104  CO2 22 - 32 mmol/L 28 - 27  Calcium 8.9 -  10.3 mg/dL 9.9 - 9.8  Total Protein 6.5 - 8.1 g/dL 7.7 - 7.1  Total Bilirubin 0.3 - 1.2 mg/dL 0.7 - 0.6  Alkaline Phos 38 - 126 U/L 65 - 51  AST 15 - 41 U/L 22 - 26  ALT 0 - 44 U/L 37 - 23      RADIOGRAPHIC STUDIES: I have personally reviewed the radiological images as listed and agreed with the findings in the report. No results found.   ASSESSMENT & PLAN: 74 year old male with history of stroke, HTN, HL, GERD, prostate cancer with  1.  Well differentiated/G1 neuroendocrine tumor, consistent with GI primary with liver and nodal metastasis -He developed fatigue, mild weight loss, abdominal pain, and flushing over a year ago, after treatment for prostate cancer.  -due to rising PSA, CT/bone scan were obtained showing a mesenteric mass and single liver lesion. We do not have previous CT to compare.  -He underwent PET dotatate on 04/09/2020, which I previously reviewed with him, shows intense uptake in the central mesenteric mass with 3 well differentiated liver metastases and small peritoneal/nodal metastatic deposits along the left iliac vessels.  There is no primary bowel lesion identified.  There is a very small lesion in the pancreas with indeterminate activity.  The primary site is still unknown. -Chromogranin A on 8/2 is normal -We reviewed liver biopsy from 04/30/2020 which shows metastatic well differentiated/G1 neuroendocrine tumor to the liver.  IHC stains positive for synaptophysin, chromogranin, CD56 and CDX2 and negative for TTF-1-1.  Overall consistent with a GI primary.  Ki-67 of 1% -We plan to discuss in tumor board if he needs full GI work-up vs EUS to determine the primary site -We discussed that unfortunately this is metastatic disease.  Surgical resection is not an option.  We are recommending first-line treatment with Sandostatin injections every 4 weeks.  Side effects including but not limited to fatigue, diarrhea, pain at the injection site, heart strain/heart failure were  discussed in detail.  He agrees to proceed.  The goal is palliative/ disease control. -He has no h/o CHF, he is being referred for baseline echo in the next few weeks. -Ryan Wilkerson appears stable.  He tolerated the liver biopsy well.  He has mild flushing, otherwise no pain or diarrhea.  He has a good overall performance status, weight is stable.  We discussed a well-differentiated/low-grade neuroendocrine tumor typically carries a good prognosis and he will likely tolerate treatment well.   -He will return for first injection next week, he will follow-up in 5 weeks with his second  injection, if he tolerates well will increase to full dose. -Patient seen with Dr. Burr Medico  2.  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 Tammi Klippel -PSA down to 1.8 on 08/2018, steady rise to 4.69 on 6/21 -CT/bone scan in 6/21 showed no obvious metastatic disease from prostate cancer but did reveal mesenteric mass and liver lesion ultimately biopsy-proven neuroendocrine tumor -Followed by Dr. Tresa [facility] at [facility] urology, further work-up/treatment pending.  Will CC my note  3.  Family history/genetics -He has personal and strong family history of cancers, including 7 brothers with prostate cancer, 1 of those has lung cancer also, a sister with lung cancer and another sister with breast cancer -Patient underwent genetic testing with [facility] Common hereditary cancer panel including the prostate cancer panel which was negative for pathogenic mutation or VUS per report dated 10/28/2017  4. 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 Tresa Moore   No problem-specific Assessment & Plan notes found for this encounter.   Orders Placed This Encounter  Procedures  . ECHOCARDIOGRAM COMPLETE    Standing Status:   Future    Standing Expiration Date:   05/03/2021    Order Specific Question:   Where should this test be performed    Answer:   [place]    Order Specific Question:   Perflutren DEFINITY (image enhancing agent) should be administered unless hypersensitivity or allergy exist    Answer:   Administer Perflutren    Order Specific Question:   Reason for exam-Echo    Answer:   Chemotherapy evaluation  v87.41 / v58.11   All questions were answered. The patient knows to call the clinic with any problems, questions or concerns. No barriers to learning were detected.     Alla Feeling, NP 05/03/20   Addendum  I have seen the patient, examined him. I agree with the assessment and and plan and have edited the notes.   I have personally reviewed his dotatate PET scan, which showed hypermetabolic mesenteric mass, liver metastasis and abdominal nodal metastasis.  The primary tumor is not definitive, but PET scan showed a small mild hypermetabolic pancreatic lesion in the tail. However the large hypermetabolic mesentery mets if more common from small bowel primary.  I discussed the above with patient and his daughter in detail, we discussed this is not resectable disease, unfortunately not curable.  We discussed treatment options, I recommend first-line with Sandostatin injection.  Benefit and side effects discussed with him, he agrees to proceed.  He understands Sandostatin injection is unlikely going to  shrink his tumor dramatically, he will likely have stable disease on injections.  We also discussed subsequent treatment options, especially Lutathera.  He voiced understanding and agrees with the plan.  All questions were answered.  I plan to present his case in GI tumor conference  next week, to see if we need further work-up for primary tumor.  Plan to start him on Sandostatin injection 73m next week, will increase to 30 mg from second injection if he tolerates well. F/u in 5 weeks   YTruitt Merle 05/04/2020

## 2020-05-03 NOTE — Telephone Encounter (Signed)
Scheduled per los. Gave avs and calendar  

## 2020-05-09 ENCOUNTER — Other Ambulatory Visit: Payer: Self-pay

## 2020-05-09 NOTE — Progress Notes (Signed)
Spoke to patient regarding Tumor Board discussion.  Explained to him recommendation is to obtain MR of pancreas for further evaluation.  Most likely this will not change our treatment but is needed.  Patient understands he will receive a call from [place] with this MR appointment and that Dr. Burr Medico will call him with the results.

## 2020-05-09 NOTE — Addendum Note (Signed)
Addended by: Truitt Merle on: 05/09/2020 07:47 AM   Modules accepted: Orders

## 2020-05-10 ENCOUNTER — Other Ambulatory Visit: Payer: Self-pay

## 2020-05-10 ENCOUNTER — Inpatient Hospital Stay: Payer: Medicare HMO

## 2020-05-10 VITALS — BP 139/60 | HR 79 | Resp 18

## 2020-05-10 DIAGNOSIS — C7B8 Other secondary neuroendocrine tumors: Secondary | ICD-10-CM

## 2020-05-10 DIAGNOSIS — I300 Acute nonspecific idiopathic pericarditis: Secondary | ICD-10-CM | POA: Diagnosis not present

## 2020-05-10 DIAGNOSIS — Z7189 Other specified counseling: Secondary | ICD-10-CM

## 2020-05-10 MED ORDER — OCTREOTIDE ACETATE 20 MG IM KIT
PACK | INTRAMUSCULAR | Status: AC
  Filled 2020-05-10: qty 1

## 2020-05-10 MED ORDER — OCTREOTIDE ACETATE 30 MG IM KIT: 30.0000 mg | PACK | Freq: Once | INTRAMUSCULAR | Status: DC

## 2020-05-10 MED ORDER — OCTREOTIDE ACETATE 30 MG IM KIT
PACK | INTRAMUSCULAR | Status: AC
  Filled 2020-05-10: qty 1

## 2020-05-10 MED ORDER — OCTREOTIDE ACETATE 20 MG IM KIT
20.0000 mg | PACK | Freq: Once | INTRAMUSCULAR | Status: AC
  Administered 2020-05-10: 20 mg via INTRAMUSCULAR

## 2020-05-10 NOTE — Patient Instructions (Signed)
Octreotide injection solution What is this medicine? OCTREOTIDE (ok TREE oh tide) is used to reduce blood levels of growth hormone in patients with a condition called acromegaly. This medicine also reduces flushing and watery diarrhea caused by certain types of cancer. This medicine may be used for other purposes; ask your health care provider or pharmacist if you have questions. COMMON BRAND NAME(S): Bynfezia, Sandostatin What should I tell my health care provider before I take this medicine? They need to know if you have any of these conditions:  diabetes  gallbladder disease  kidney disease  liver disease  thyroid disease  an unusual or allergic reaction to octreotide, other medicines, foods, dyes, or preservatives  pregnant or trying to get pregnant  breast-feeding How should I use this medicine? This medicine is for injection under the skin or into a vein (only in emergency situations). It is usually given by a health care professional in a hospital or clinic setting. If you get this medicine at home, you will be taught how to prepare and give this medicine. Allow the injection solution to come to room temperature before use. Do not warm it artificially. Use exactly as directed. Take your medicine at regular intervals. Do not take your medicine more often than directed. It is important that you put your used needles and syringes in a special sharps container. Do not put them in a trash can. If you do not have a sharps container, call your pharmacist or healthcare provider to get one. Talk to your pediatrician regarding the use of this medicine in children. Special care may be needed. Overdosage: If you think you have taken too much of this medicine contact a poison control center or emergency room at once. NOTE: This medicine is only for you. Do not share this medicine with others. What if I miss a dose? If you miss a dose, take it as soon as you can. If it is almost time for your  next dose, take only that dose. Do not take double or extra doses. What may interact with this medicine?  bromocriptine  certain medicines for blood pressure, heart disease, irregular heartbeat  cyclosporine  diuretics  medicines for diabetes, including insulin  quinidine This list may not describe all possible interactions. Give your health care provider a list of all the medicines, herbs, non-prescription drugs, or dietary supplements you use. Also tell them if you smoke, drink alcohol, or use illegal drugs. Some items may interact with your medicine. What should I watch for while using this medicine? Visit your doctor or health care professional for regular checks on your progress. To help reduce irritation at the injection site, use a different site for each injection and make sure the solution is at room temperature before use. This medicine may cause decreases in blood sugar. Signs of low blood sugar include chills, cool, pale skin or cold sweats, drowsiness, extreme hunger, fast heartbeat, headache, nausea, nervousness or anxiety, shakiness, trembling, unsteadiness, tiredness, or weakness. Contact your doctor or health care professional right away if you experience any of these symptoms. This medicine may increase blood sugar. Ask your healthcare provider if changes in diet or medicines are needed if you have diabetes. This medicine may cause a decrease in vitamin B12. You should make sure that you get enough vitamin B12 while you are taking this medicine. Discuss the foods you eat and the vitamins you take with your health care professional. What side effects may I notice from receiving this medicine? Side   effects that you should report to your doctor or health care professional as soon as possible:  allergic reactions like skin rash, itching or hives, swelling of the face, lips, or tongue  fast, slow, or irregular heartbeat  right upper belly pain  severe stomach pain  signs  and symptoms of high blood sugar such as being more thirsty or hungry or having to urinate more than normal. You may also feel very tired or have blurry vision.  signs and symptoms of low blood sugar such as feeling anxious; confusion; dizziness; increased hunger; unusually weak or tired; increased sweating; shakiness; cold, clammy skin; irritable; headache; blurred vision; fast heartbeat; loss of consciousness  unusually weak or tired Side effects that usually do not require medical attention (report to your doctor or health care professional if they continue or are bothersome):  diarrhea  dizziness  gas  headache  nausea, vomiting  pain, redness, or irritation at site where injected  upset stomach This list may not describe all possible side effects. Call your doctor for medical advice about side effects. You may report side effects to FDA at 1-800-FDA-1088. Where should I keep my medicine? Keep out of the reach of children. Store in a refrigerator between 2 and 8 degrees C (36 and 46 degrees F). Protect from light. Allow to come to room temperature naturally. Do not use artificial heat. If protected from light, the injection may be stored at room temperature between 20 and 30 degrees C (70 and 86 degrees F) for 14 days. After the initial use, throw away any unused portion of a multiple dose vial after 14 days. Throw away unused portions of the ampules after use. NOTE: This sheet is a summary. It may not cover all possible information. If you have questions about this medicine, talk to your doctor, pharmacist, or health care provider.  2020 Elsevier/Gold Standard (2019-03-17 13:33:09)  

## 2020-05-10 NOTE — Progress Notes (Signed)
MD wants to proceed with Sandostatin 20mg  today (first injection) then increase to 30mg  with f/u injections.  Hardie Pulley, PharmD, BCPS, BCOP

## 2020-05-11 ENCOUNTER — Other Ambulatory Visit: Payer: Self-pay | Admitting: Hematology

## 2020-05-11 ENCOUNTER — Encounter: Payer: Self-pay | Admitting: Nurse Practitioner

## 2020-05-11 ENCOUNTER — Telehealth: Payer: Self-pay

## 2020-05-11 DIAGNOSIS — C7B8 Other secondary neuroendocrine tumors: Secondary | ICD-10-CM

## 2020-05-11 NOTE — Telephone Encounter (Signed)
I called him back. He has been having chest pain with deep breath for the past 4-5 weeks, worse lately. He also reports dyspnea on exertion, but no chest pain or heaviness on exertion, no cough or fever. He has HTN, no previous cardiac history. I recommend lab at 1pm and see NP [name] at 1:45pm on Monday 9/13. I will order d-dimer, EKG, and check his oxygen level with 6-min walking before visit, to see if he needs CTA chest to rule out PE. I instructed pt to go to ED over the weekend of he experience worsening symptoms or new chest pain on exertion. He agrees with the plan.   Truitt Merle MD

## 2020-05-11 NOTE — Telephone Encounter (Signed)
I called spoke with pt concerning received my chart message copied below pt denies any true SOB chest pain N/V/D palpitations arm pain radiating to abdomen or back declines advice to go to ER for eval treat states he has been outside walking around and states he will wait until Monday to speak with Np     "i am having some shortness of breath,irregular heart beat and upper chest pain. can we talk?"

## 2020-05-12 ENCOUNTER — Encounter (HOSPITAL_COMMUNITY): Payer: Self-pay | Admitting: *Deleted

## 2020-05-12 ENCOUNTER — Other Ambulatory Visit: Payer: Self-pay

## 2020-05-12 ENCOUNTER — Inpatient Hospital Stay (HOSPITAL_COMMUNITY)
Admission: EM | Admit: 2020-05-12 | Discharge: 2020-05-14 | DRG: 287 | Disposition: A | Discharge status: Home / Self Care | Payer: No Typology Code available for payment source | Attending: Cardiovascular Disease | Admitting: Cardiovascular Disease

## 2020-05-12 ENCOUNTER — Inpatient Hospital Stay (HOSPITAL_COMMUNITY): Payer: No Typology Code available for payment source

## 2020-05-12 ENCOUNTER — Emergency Department (HOSPITAL_COMMUNITY): Payer: No Typology Code available for payment source

## 2020-05-12 ENCOUNTER — Inpatient Hospital Stay (HOSPITAL_COMMUNITY)
Admission: EM | Disposition: A | Discharge status: Home / Self Care | Payer: Self-pay | Source: Home / Self Care | Attending: Cardiovascular Disease

## 2020-05-12 DIAGNOSIS — R079 Chest pain, unspecified: Secondary | ICD-10-CM | POA: Diagnosis not present

## 2020-05-12 DIAGNOSIS — C61 Malignant neoplasm of prostate: Secondary | ICD-10-CM | POA: Diagnosis present

## 2020-05-12 DIAGNOSIS — I300 Acute nonspecific idiopathic pericarditis: Secondary | ICD-10-CM | POA: Diagnosis present

## 2020-05-12 DIAGNOSIS — Z800 Family history of malignant neoplasm of digestive organs: Secondary | ICD-10-CM

## 2020-05-12 DIAGNOSIS — E785 Hyperlipidemia, unspecified: Secondary | ICD-10-CM | POA: Diagnosis present

## 2020-05-12 DIAGNOSIS — I213 ST elevation (STEMI) myocardial infarction of unspecified site: Secondary | ICD-10-CM | POA: Diagnosis not present

## 2020-05-12 DIAGNOSIS — Z8673 Personal history of transient ischemic attack (TIA), and cerebral infarction without residual deficits: Secondary | ICD-10-CM | POA: Diagnosis not present

## 2020-05-12 DIAGNOSIS — E7800 Pure hypercholesterolemia, unspecified: Secondary | ICD-10-CM | POA: Diagnosis present

## 2020-05-12 DIAGNOSIS — C7A8 Other malignant neuroendocrine tumors: Secondary | ICD-10-CM | POA: Diagnosis present

## 2020-05-12 DIAGNOSIS — I10 Essential (primary) hypertension: Secondary | ICD-10-CM | POA: Diagnosis present

## 2020-05-12 DIAGNOSIS — Z79899 Other long term (current) drug therapy: Secondary | ICD-10-CM

## 2020-05-12 DIAGNOSIS — K219 Gastro-esophageal reflux disease without esophagitis: Secondary | ICD-10-CM | POA: Diagnosis present

## 2020-05-12 DIAGNOSIS — Z8042 Family history of malignant neoplasm of prostate: Secondary | ICD-10-CM

## 2020-05-12 DIAGNOSIS — I959 Hypotension, unspecified: Secondary | ICD-10-CM | POA: Diagnosis present

## 2020-05-12 DIAGNOSIS — Z87891 Personal history of nicotine dependence: Secondary | ICD-10-CM

## 2020-05-12 DIAGNOSIS — C787 Secondary malignant neoplasm of liver and intrahepatic bile duct: Secondary | ICD-10-CM | POA: Diagnosis present

## 2020-05-12 DIAGNOSIS — I313 Pericardial effusion (noninflammatory): Secondary | ICD-10-CM | POA: Diagnosis present

## 2020-05-12 DIAGNOSIS — K5900 Constipation, unspecified: Secondary | ICD-10-CM | POA: Diagnosis present

## 2020-05-12 DIAGNOSIS — Z20822 Contact with and (suspected) exposure to covid-19: Secondary | ICD-10-CM | POA: Diagnosis present

## 2020-05-12 DIAGNOSIS — I361 Nonrheumatic tricuspid (valve) insufficiency: Secondary | ICD-10-CM

## 2020-05-12 DIAGNOSIS — E782 Mixed hyperlipidemia: Secondary | ICD-10-CM | POA: Diagnosis not present

## 2020-05-12 HISTORY — PX: LEFT HEART CATH AND CORONARY ANGIOGRAPHY: CATH118249

## 2020-05-12 LAB — HEMOGLOBIN A1C
Hgb A1c MFr Bld: 4.5 % — ABNORMAL LOW (ref 4.8–5.6)
Mean Plasma Glucose: 82.45 mg/dL

## 2020-05-12 LAB — SARS CORONAVIRUS 2 BY RT PCR (HOSPITAL ORDER, PERFORMED IN ~~LOC~~ HOSPITAL LAB): SARS Coronavirus 2: NEGATIVE

## 2020-05-12 LAB — CBC WITH DIFFERENTIAL/PLATELET
Abs Immature Granulocytes: 0.04 10*3/uL (ref 0.00–0.07)
Basophils Absolute: 0 10*3/uL (ref 0.0–0.1)
Basophils Relative: 0 %
Eosinophils Absolute: 0 10*3/uL (ref 0.0–0.5)
Eosinophils Relative: 0 %
HCT: 35.8 % — ABNORMAL LOW (ref 39.0–52.0)
Hemoglobin: 11.1 g/dL — ABNORMAL LOW (ref 13.0–17.0)
Immature Granulocytes: 0 %
Lymphocytes Relative: 7 %
Lymphs Abs: 0.9 10*3/uL (ref 0.7–4.0)
MCH: 29.1 pg (ref 26.0–34.0)
MCHC: 31 g/dL (ref 30.0–36.0)
MCV: 94 fL (ref 80.0–100.0)
Monocytes Absolute: 1.2 10*3/uL — ABNORMAL HIGH (ref 0.1–1.0)
Monocytes Relative: 9 %
Neutro Abs: 10.2 10*3/uL — ABNORMAL HIGH (ref 1.7–7.7)
Neutrophils Relative %: 84 %
Platelets: 240 10*3/uL (ref 150–400)
RBC: 3.81 MIL/uL — ABNORMAL LOW (ref 4.22–5.81)
RDW: 11.4 % — ABNORMAL LOW (ref 11.5–15.5)
WBC: 12.3 10*3/uL — ABNORMAL HIGH (ref 4.0–10.5)
nRBC: 0 % (ref 0.0–0.2)

## 2020-05-12 LAB — TROPONIN I (HIGH SENSITIVITY)
Troponin I (High Sensitivity): 11 ng/L (ref <18)
Troponin I (High Sensitivity): 30 ng/L — ABNORMAL HIGH (ref <18)

## 2020-05-12 LAB — LIPID PANEL
Cholesterol: 129 mg/dL (ref 0–200)
HDL: 48 mg/dL (ref >40)
LDL Cholesterol: 71 mg/dL (ref 0–99)
Total CHOL/HDL Ratio: 2.7 RATIO
Triglycerides: 50 mg/dL (ref <150)
VLDL: 10 mg/dL (ref 0–40)

## 2020-05-12 LAB — ECHOCARDIOGRAM COMPLETE
Area-P 1/2: 3.85 cm2
Height: 68 in
S' Lateral: 2.5 cm
Weight: 2511.48 oz

## 2020-05-12 LAB — COMPREHENSIVE METABOLIC PANEL
ALT: 15 U/L (ref 0–44)
AST: 18 U/L (ref 15–41)
Albumin: 3.6 g/dL (ref 3.5–5.0)
Alkaline Phosphatase: 55 U/L (ref 38–126)
Anion gap: 12 (ref 5–15)
BUN: 12 mg/dL (ref 8–23)
CO2: 23 mmol/L (ref 22–32)
Calcium: 8.6 mg/dL — ABNORMAL LOW (ref 8.9–10.3)
Chloride: 101 mmol/L (ref 98–111)
Creatinine, Ser: 1.23 mg/dL (ref 0.61–1.24)
GFR calc Af Amer: >60 mL/min (ref >60)
GFR calc non Af Amer: 58 mL/min — ABNORMAL LOW (ref >60)
Glucose, Bld: 148 mg/dL — ABNORMAL HIGH (ref 70–99)
Potassium: 3.4 mmol/L — ABNORMAL LOW (ref 3.5–5.1)
Sodium: 136 mmol/L (ref 135–145)
Total Bilirubin: 0.8 mg/dL (ref 0.3–1.2)
Total Protein: 6.7 g/dL (ref 6.5–8.1)

## 2020-05-12 LAB — PROTIME-INR
INR: 1.1 (ref 0.8–1.2)
Prothrombin Time: 14.2 seconds (ref 11.4–15.2)

## 2020-05-12 LAB — SEDIMENTATION RATE: Sed Rate: 17 mm/hr — ABNORMAL HIGH (ref 0–16)

## 2020-05-12 LAB — APTT: aPTT: 27 seconds (ref 24–36)

## 2020-05-12 LAB — MAGNESIUM: Magnesium: 1.7 mg/dL (ref 1.7–2.4)

## 2020-05-12 LAB — MRSA PCR SCREENING: MRSA by PCR: NEGATIVE

## 2020-05-12 IMAGING — CT CT ANGIO CHEST
2 of 5 series · 19 of 46 positions shown · IV contrast (omnipaque)
Comparison: PET CT [DATE]

CLINICAL DATA: Chest pain all day, worse with inspiration.

EXAM:
CT ANGIOGRAPHY CHEST WITH CONTRAST
TECHNIQUE: Multidetector CT imaging of the chest was performed using the
standard protocol during bolus administration of intravenous
contrast. Multiplanar CT image reconstructions and MIPs were
obtained to evaluate the vascular anatomy.
CONTRAST:  100mL OMNIPAQUE IOHEXOL 350 MG/ML SOLN

[Series 5: dissection 2mm · axial · 0.68mm/px · z∈[+993,+1353]mm · 16 of 194 slices shown]
[im 7/194  lung]
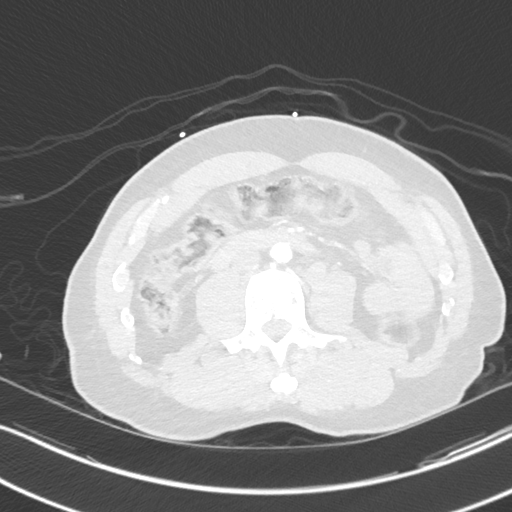
[im 19/194  soft-tissue]
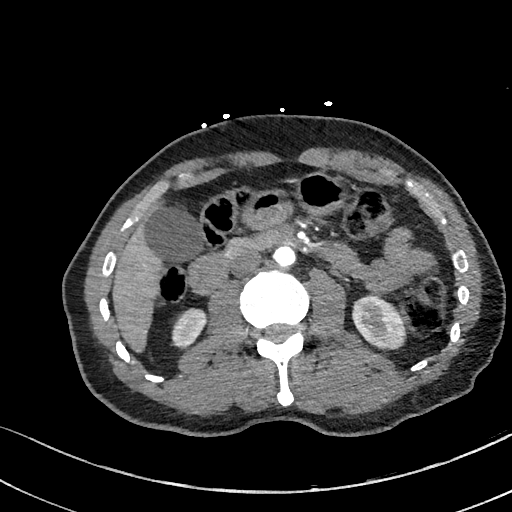
[im 32/194  lung]
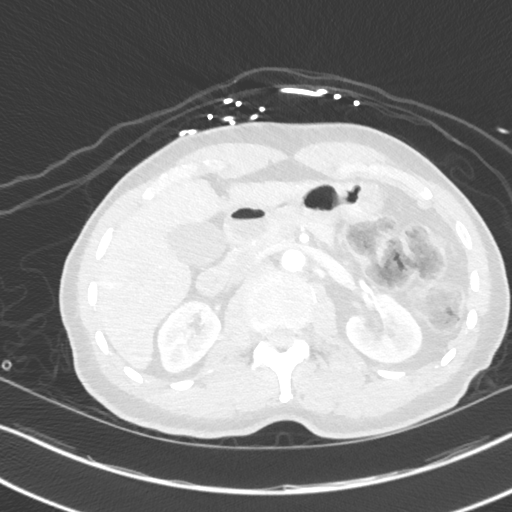
[im 44/194  soft-tissue]
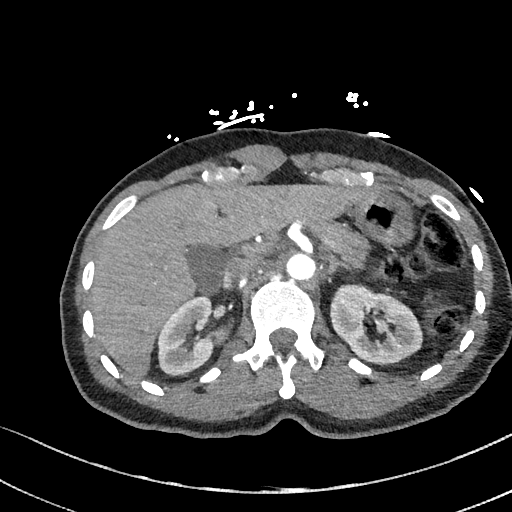
[im 57/194  lung]
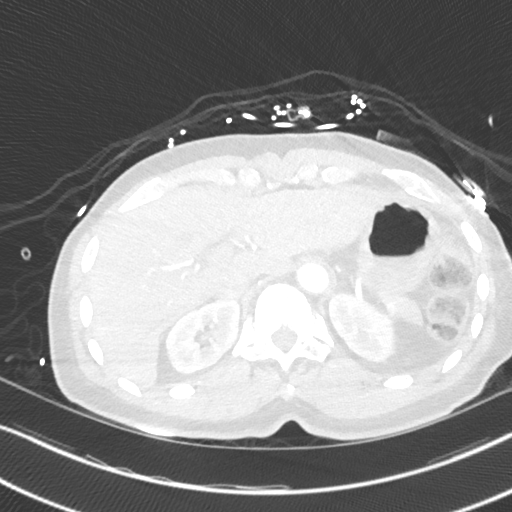
[im 69/194  soft-tissue]
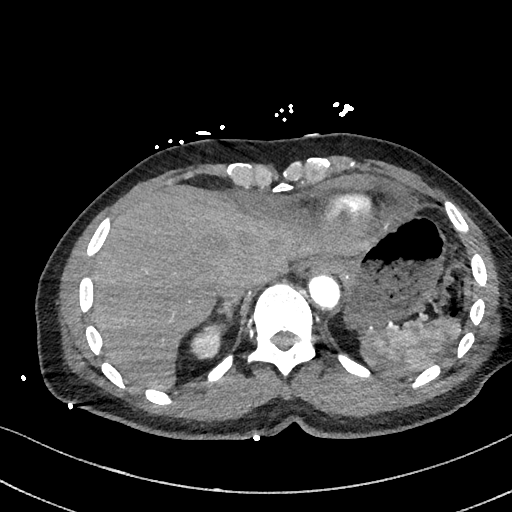
[im 81/194  lung]
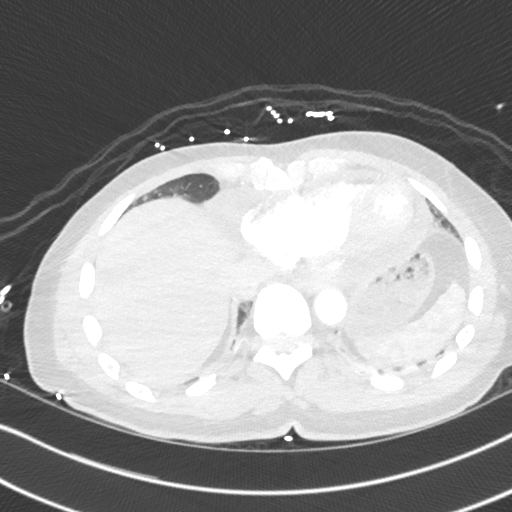
[im 94/194  soft-tissue]
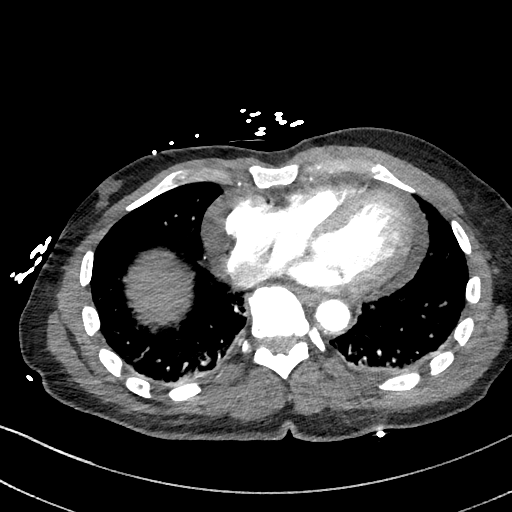
[im 100/194  lung]
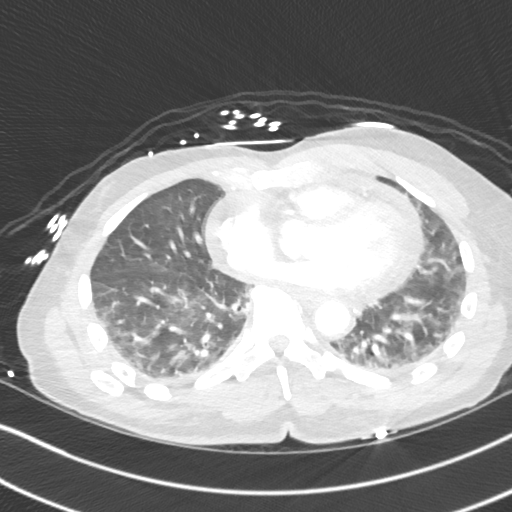
[im 113/194  soft-tissue]
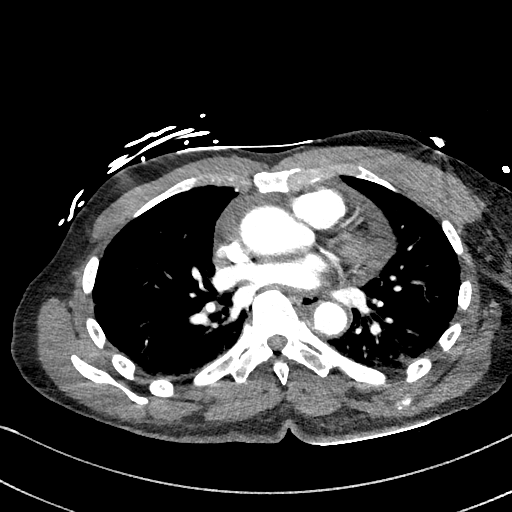
[im 125/194  lung]
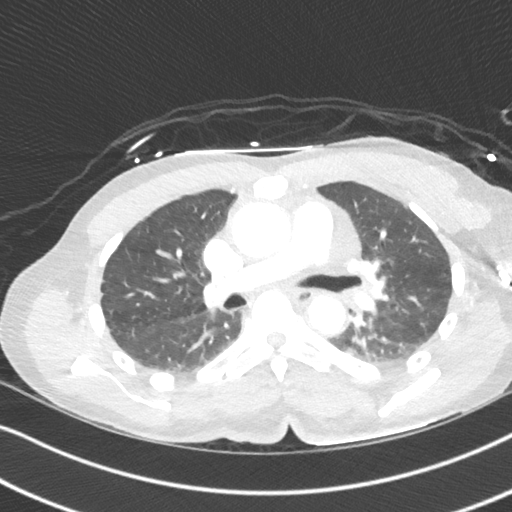
[im 137/194  soft-tissue]
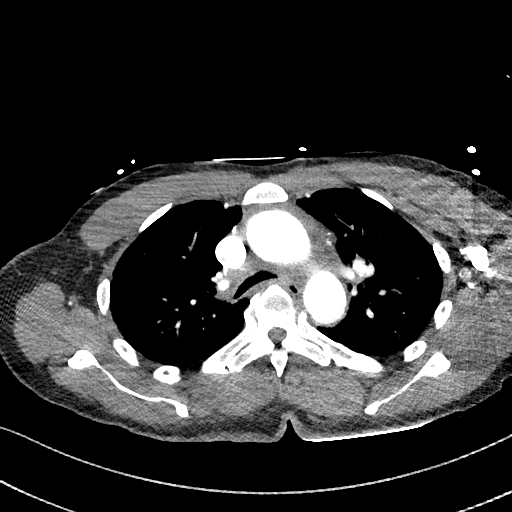
[im 150/194  lung]
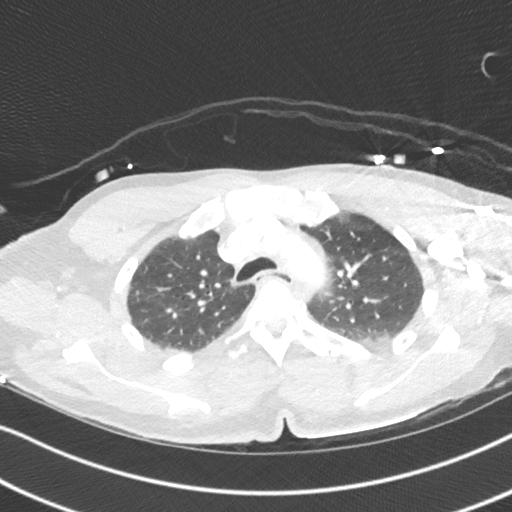
[im 162/194  soft-tissue]
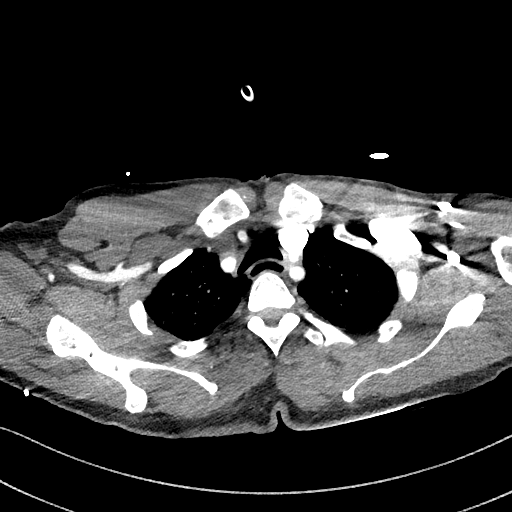
[im 175/194  lung]
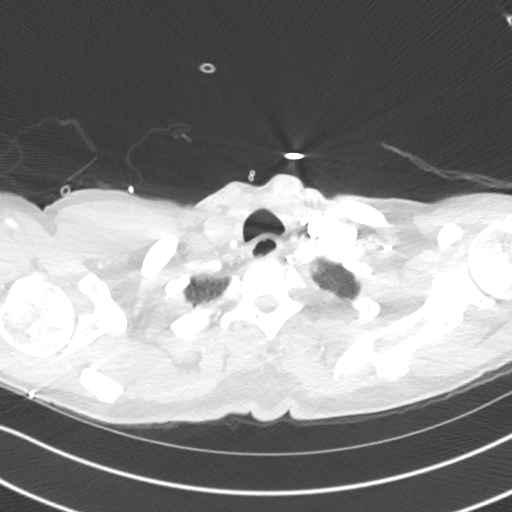
[im 187/194  soft-tissue]
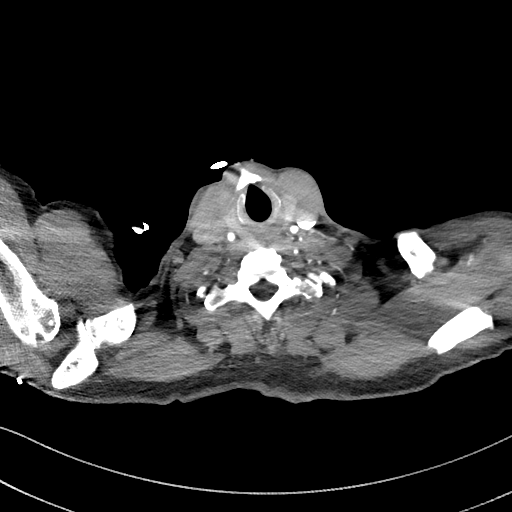

[Series 8: dissection 2mm cor · coronal · 0.76mm/px · 3 of 149 slices shown]
[im 38/149  soft-tissue]
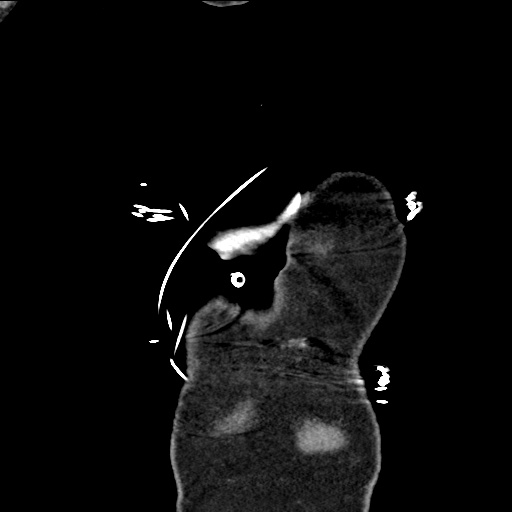
[im 75/149  soft-tissue]
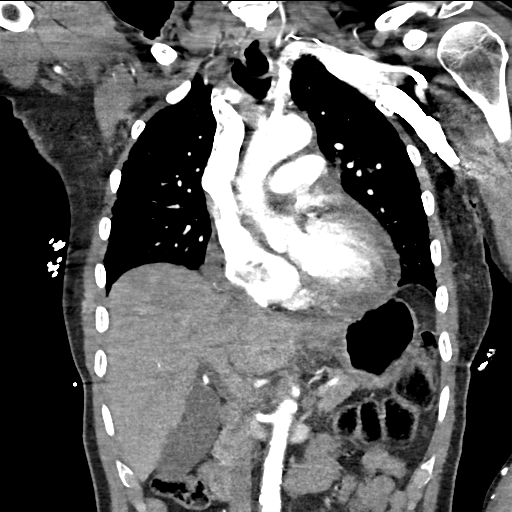
[im 112/149  soft-tissue]
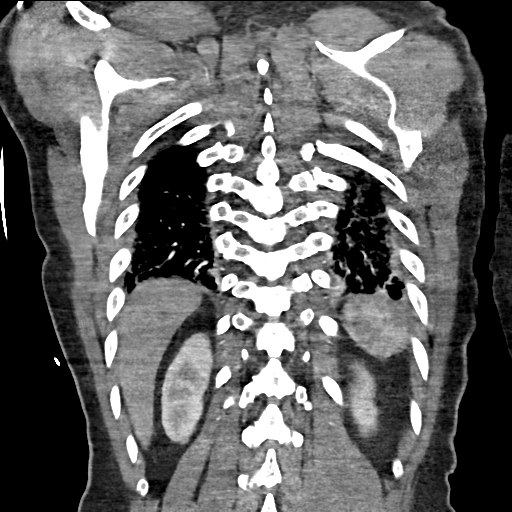

[19 of 46 positions shown; findings below may reference images not displayed]

FINDINGS: Cardiovascular: Mild cardiac enlargement. Moderate pericardial
effusion. Normal caliber thoracic aorta. Maximal diameter of the
ascending thoracic aorta is 3.5 cm. No evidence of aortic
dissection. No mural thrombus or wall thickening. Great vessel
origins are patent. Pulmonary arteries are moderately well opacified
without evidence of significant pulmonary embolus.

Mediastinum/Nodes: Esophagus is decompressed. No significant
lymphadenopathy in the chest. Scattered calcified lymph nodes
consistent with postinflammatory change.

Lungs/Pleura: Atelectasis or edema in the lung bases. No pleural
effusions. No pneumothorax. Airways are patent. Flattening of the
tracheobronchial tree, possibly chondromalacia.

Upper Abdomen: No acute process demonstrated in the visualized upper
abdomen.

Musculoskeletal: No destructive bone lesions.

Review of the MIP images confirms the above findings.
IMPRESSION: 1. No evidence of significant pulmonary embolus.
2. Mild cardiac enlargement with moderate pericardial effusion.
3. Atelectasis or edema in the lung bases.
4. Flattening of the tracheobronchial tree, possibly chondromalacia.
5. Aortic atherosclerosis.

Aortic Atherosclerosis ([K2]-[K2]).

Results were discussed at the workstation with Dr. SIGIS in
cardiology at the time of dictation.

## 2020-05-12 SURGERY — LEFT HEART CATH AND CORONARY ANGIOGRAPHY
Anesthesia: LOCAL

## 2020-05-12 MED ORDER — FENTANYL CITRATE (PF) 100 MCG/2ML IJ SOLN
INTRAMUSCULAR | Status: AC
  Filled 2020-05-12: qty 2

## 2020-05-12 MED ORDER — SODIUM CHLORIDE 0.9 % IV SOLN: INTRAVENOUS | Status: DC

## 2020-05-12 MED ORDER — LORATADINE 10 MG PO TABS
10.0000 mg | ORAL_TABLET | Freq: Every morning | ORAL | Status: DC
  Administered 2020-05-12 – 2020-05-14 (×3): 10 mg via ORAL
  Filled 2020-05-12 (×3): qty 1

## 2020-05-12 MED ORDER — CLOPIDOGREL BISULFATE 75 MG PO TABS
75.0000 mg | ORAL_TABLET | Freq: Every day | ORAL | Status: DC
  Administered 2020-05-12 – 2020-05-14 (×3): 75 mg via ORAL
  Filled 2020-05-12 (×3): qty 1

## 2020-05-12 MED ORDER — IOHEXOL 350 MG/ML SOLN
INTRAVENOUS | Status: AC
  Filled 2020-05-12: qty 1

## 2020-05-12 MED ORDER — SODIUM CHLORIDE 0.9% FLUSH
3.0000 mL | Freq: Two times a day (BID) | INTRAVENOUS | Status: DC
  Administered 2020-05-12 – 2020-05-14 (×4): 3 mL via INTRAVENOUS

## 2020-05-12 MED ORDER — LABETALOL HCL 5 MG/ML IV SOLN: 10.0000 mg | INTRAVENOUS | Status: AC | PRN

## 2020-05-12 MED ORDER — SODIUM CHLORIDE 0.9 % IV SOLN: 250.0000 mL | INTRAVENOUS | Status: DC | PRN

## 2020-05-12 MED ORDER — SODIUM CHLORIDE 0.9% FLUSH: 3.0000 mL | INTRAVENOUS | Status: DC | PRN

## 2020-05-12 MED ORDER — ONDANSETRON HCL 4 MG/2ML IJ SOLN
4.0000 mg | Freq: Once | INTRAMUSCULAR | Status: AC
  Administered 2020-05-12: 4 mg via INTRAVENOUS
  Filled 2020-05-12: qty 2

## 2020-05-12 MED ORDER — MIDAZOLAM HCL 2 MG/2ML IJ SOLN
INTRAMUSCULAR | Status: AC
  Filled 2020-05-12: qty 2

## 2020-05-12 MED ORDER — MORPHINE SULFATE (PF) 2 MG/ML IV SOLN
2.0000 mg | INTRAVENOUS | Status: DC | PRN
  Administered 2020-05-12 (×2): 2 mg via INTRAVENOUS
  Filled 2020-05-12 (×2): qty 1

## 2020-05-12 MED ORDER — FENTANYL CITRATE (PF) 100 MCG/2ML IJ SOLN
INTRAMUSCULAR | Status: DC | PRN
Start: 2020-05-12 — End: 2020-05-12
  Administered 2020-05-12 (×2): 25 ug via INTRAVENOUS

## 2020-05-12 MED ORDER — LIDOCAINE HCL (PF) 1 % IJ SOLN
INTRAMUSCULAR | Status: AC
  Filled 2020-05-12: qty 30

## 2020-05-12 MED ORDER — NITROGLYCERIN 1 MG/10 ML FOR IR/CATH LAB
INTRA_ARTERIAL | Status: AC
  Filled 2020-05-12: qty 10

## 2020-05-12 MED ORDER — HEPARIN SODIUM (PORCINE) 5000 UNIT/ML IJ SOLN
4000.0000 [IU] | Freq: Once | INTRAMUSCULAR | Status: AC
  Administered 2020-05-12: 4000 [IU] via INTRAVENOUS

## 2020-05-12 MED ORDER — IOHEXOL 350 MG/ML SOLN
100.0000 mL | Freq: Once | INTRAVENOUS | Status: AC | PRN
  Administered 2020-05-12: 100 mL via INTRAVENOUS

## 2020-05-12 MED ORDER — VERAPAMIL HCL 2.5 MG/ML IV SOLN
INTRAVENOUS | Status: DC | PRN
  Administered 2020-05-12: 10 mL via INTRA_ARTERIAL

## 2020-05-12 MED ORDER — COLCHICINE 0.6 MG PO TABS
0.6000 mg | ORAL_TABLET | Freq: Two times a day (BID) | ORAL | Status: DC
  Administered 2020-05-12 – 2020-05-14 (×5): 0.6 mg via ORAL
  Filled 2020-05-12 (×5): qty 1

## 2020-05-12 MED ORDER — FLUTICASONE PROPIONATE 50 MCG/ACT NA SUSP
2.0000 | Freq: Every day | NASAL | Status: DC
  Administered 2020-05-12 – 2020-05-14 (×3): 2 via NASAL
  Filled 2020-05-12: qty 16

## 2020-05-12 MED ORDER — COLCHICINE 0.6 MG PO TABS: 0.6000 mg | ORAL_TABLET | Freq: Two times a day (BID) | ORAL | Status: DC

## 2020-05-12 MED ORDER — HEPARIN (PORCINE) IN NACL 1000-0.9 UT/500ML-% IV SOLN
INTRAVENOUS | Status: AC
  Filled 2020-05-12: qty 1000

## 2020-05-12 MED ORDER — HEPARIN (PORCINE) IN NACL 1000-0.9 UT/500ML-% IV SOLN
INTRAVENOUS | Status: DC | PRN
  Administered 2020-05-12 (×2): 500 mL

## 2020-05-12 MED ORDER — ACETAMINOPHEN 325 MG PO TABS
650.0000 mg | ORAL_TABLET | ORAL | Status: DC | PRN
  Administered 2020-05-13: 650 mg via ORAL
  Filled 2020-05-12: qty 2

## 2020-05-12 MED ORDER — ASPIRIN 300 MG RE SUPP
300.0000 mg | Freq: Once | RECTAL | Status: DC
  Filled 2020-05-12: qty 1

## 2020-05-12 MED ORDER — HEPARIN SODIUM (PORCINE) 1000 UNIT/ML IJ SOLN
INTRAMUSCULAR | Status: AC
  Filled 2020-05-12: qty 1

## 2020-05-12 MED ORDER — MORPHINE SULFATE (PF) 2 MG/ML IV SOLN
2.0000 mg | Freq: Once | INTRAVENOUS | Status: AC
  Administered 2020-05-12: 2 mg via INTRAVENOUS
  Filled 2020-05-12: qty 1

## 2020-05-12 MED ORDER — PANTOPRAZOLE SODIUM 40 MG PO TBEC
40.0000 mg | DELAYED_RELEASE_TABLET | Freq: Every day | ORAL | Status: DC
  Administered 2020-05-12 – 2020-05-14 (×3): 40 mg via ORAL
  Filled 2020-05-12 (×3): qty 1

## 2020-05-12 MED ORDER — MIDAZOLAM HCL 2 MG/2ML IJ SOLN
INTRAMUSCULAR | Status: DC | PRN
  Administered 2020-05-12: 1 mg via INTRAVENOUS

## 2020-05-12 MED ORDER — HYDRALAZINE HCL 20 MG/ML IJ SOLN: 10.0000 mg | INTRAMUSCULAR | Status: AC | PRN

## 2020-05-12 MED ORDER — CHLORHEXIDINE GLUCONATE CLOTH 2 % EX PADS
6.0000 | MEDICATED_PAD | Freq: Every day | CUTANEOUS | Status: DC
  Administered 2020-05-12 – 2020-05-13 (×2): 6 via TOPICAL

## 2020-05-12 MED ORDER — VERAPAMIL HCL 2.5 MG/ML IV SOLN
INTRAVENOUS | Status: AC
  Filled 2020-05-12: qty 2

## 2020-05-12 MED ORDER — SODIUM CHLORIDE 0.9 % IV SOLN: INTRAVENOUS | Status: AC

## 2020-05-12 MED ORDER — IOHEXOL 350 MG/ML SOLN
INTRAVENOUS | Status: DC | PRN
  Administered 2020-05-12: 50 mL

## 2020-05-12 MED ORDER — LEVETIRACETAM 250 MG PO TABS
375.0000 mg | ORAL_TABLET | Freq: Three times a day (TID) | ORAL | Status: DC
  Administered 2020-05-12 – 2020-05-14 (×7): 375 mg via ORAL
  Filled 2020-05-12 (×2): qty 2
  Filled 2020-05-12 (×2): qty 1.5
  Filled 2020-05-12 (×3): qty 2
  Filled 2020-05-12: qty 1.5
  Filled 2020-05-12: qty 2
  Filled 2020-05-12: qty 1.5
  Filled 2020-05-12: qty 2

## 2020-05-12 MED ORDER — KETOROLAC TROMETHAMINE 15 MG/ML IJ SOLN
15.0000 mg | Freq: Three times a day (TID) | INTRAMUSCULAR | Status: DC | PRN
  Administered 2020-05-12: 15 mg via INTRAVENOUS
  Filled 2020-05-12: qty 1

## 2020-05-12 MED ORDER — KETOROLAC TROMETHAMINE 15 MG/ML IJ SOLN
15.0000 mg | Freq: Four times a day (QID) | INTRAMUSCULAR | Status: DC
  Administered 2020-05-13: 15 mg via INTRAVENOUS
  Filled 2020-05-12: qty 1

## 2020-05-12 MED ORDER — ASPIRIN 81 MG PO CHEW: 324.0000 mg | CHEWABLE_TABLET | Freq: Once | ORAL | Status: DC

## 2020-05-12 MED ORDER — ONDANSETRON HCL 4 MG/2ML IJ SOLN: 4.0000 mg | Freq: Four times a day (QID) | INTRAMUSCULAR | Status: DC | PRN

## 2020-05-12 MED ORDER — GUAIFENESIN-DM 100-10 MG/5ML PO SYRP
15.0000 mL | ORAL_SOLUTION | ORAL | Status: DC | PRN
  Administered 2020-05-12 – 2020-05-13 (×2): 15 mL via ORAL
  Filled 2020-05-12 (×2): qty 15

## 2020-05-12 MED ORDER — IBUPROFEN 600 MG PO TABS
600.0000 mg | ORAL_TABLET | Freq: Three times a day (TID) | ORAL | Status: DC
  Administered 2020-05-12 – 2020-05-13 (×4): 600 mg via ORAL
  Filled 2020-05-12: qty 1
  Filled 2020-05-12: qty 3
  Filled 2020-05-12: qty 1
  Filled 2020-05-12: qty 3
  Filled 2020-05-12: qty 1
  Filled 2020-05-12 (×2): qty 3
  Filled 2020-05-12: qty 1

## 2020-05-12 MED ORDER — LIDOCAINE HCL (PF) 1 % IJ SOLN
INTRAMUSCULAR | Status: DC | PRN
  Administered 2020-05-12: 2 mL via INTRADERMAL

## 2020-05-12 MED ORDER — HEPARIN SODIUM (PORCINE) 5000 UNIT/ML IJ SOLN
INTRAMUSCULAR | Status: AC
  Filled 2020-05-12: qty 1

## 2020-05-12 SURGICAL SUPPLY — 13 items
CATH 5FR JL3.5 JR4 ANG PIG MP (CATHETERS) ×1 IMPLANT
CATH LAUNCHER 6FR AL1 (CATHETERS) IMPLANT
CATHETER LAUNCHER 6FR AL1 (CATHETERS) ×2
DEVICE RAD COMP TR BAND LRG (VASCULAR PRODUCTS) ×1 IMPLANT
GLIDESHEATH SLEND SS 6F .021 (SHEATH) ×1 IMPLANT
GUIDEWIRE INQWIRE 1.5J.035X260 (WIRE) IMPLANT
INQWIRE 1.5J .035X260CM (WIRE) ×2
KIT ENCORE 26 ADVANTAGE (KITS) ×1 IMPLANT
KIT HEART LEFT (KITS) ×2 IMPLANT
PACK CARDIAC CATHETERIZATION (CUSTOM PROCEDURE TRAY) ×2 IMPLANT
SHEATH PROBE COVER 6X72 (BAG) ×1 IMPLANT
TRANSDUCER W/STOPCOCK (MISCELLANEOUS) ×2 IMPLANT
TUBING CIL FLEX 10 FLL-RA (TUBING) ×2 IMPLANT

## 2020-05-12 NOTE — ED Notes (Signed)
Cards at bedside

## 2020-05-12 NOTE — H&P (Addendum)
Cardiology Admission History and Physical:   Patient ID: Ryan Wilkerson MRN: [id number]; DOB: [birth year]   Admission date: 05/12/2020  Primary Care Provider: Alroy Dust, L.Marlou Sa, MD [place] Cardiologist: No primary care provider on file. [place] Electrophysiologist:  None   Chief Complaint:  Chest pain  Patient Profile:   Ryan Wilkerson is a 74 y.o. 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   History of Present Illness:   Ryan Wilkerson was in his usual state of health until 6PM the evening prior to admission. He recalls going to the bathroom and straining to have a bowel movement. When he returned to the living room to sit down he had acute onset chest pain that radiated to his back. It was associated with nausea and diaphoresis and was worsening with deep inspiration. He was unable to take a deep breath because of the pain.   Upon arrival to the ED, patient was having 10/10 chest pain with BP 72/60. Initial EKG with concerning ST changes inferiorly. Bedside echo with preserved LV function and small circumferential pericardial effusion. CTA performed given differential BP in upper extremities and nature of pain, which did not show dissection.  He was taken urgently to the cath lab for coronary angiography.    Past Medical History:  Diagnosis Date  . Erectile dysfunction   . Essential tremor   . Family history of breast cancer   . Family history of prostate cancer   . Family history of stomach cancer   . GERD (gastroesophageal reflux disease)   . History of colon polyps   . Hypercholesterolemia   . Hypertension   . Nocturia   . Prostate cancer [facility]) urologist-- dr Tresa Moore /  oncologist-- dr manning   prostate bx 06-24-2016 and 09-15-2017 at dr Tresa Moore office  Stage T1c, Gleason 4+3, PSA 8.48, vol 33cc---  planned external beam  radiation therpay  . Urgency of urination   . Wears glasses     Past Surgical History:  Procedure Laterality Date  . COLONOSCOPY  last one 06/ 2018  . GOLD SEED IMPLANT N/A 12/23/2017   Procedure: GOLD SEED IMPLANT;  Surgeon: Alexis Frock, MD;  Location: [facility];  Service: Urology;  Laterality: N/A;  . PROSTATE BIOPSY  06-24-2016;  09-15-2017-- at dr Tresa Moore office  . SPACE OAR INSTILLATION N/A 12/23/2017   Procedure: SPACE OAR INSTILLATION;  Surgeon: Alexis Frock, MD;  Location: [facility];  Service: Urology;  Laterality: N/A;     Medications Prior to Admission: Prior to Admission medications   Medication Sig Start Date End Date Taking? Authorizing Provider  acetaminophen (TYLENOL) 500 MG tablet Take 1,000 mg by mouth every 6 (six) hours as needed for headache (pain).    [provider]  diclofenac (VOLTAREN) 75 MG EC tablet  03/14/20   [provider]  fluticasone (FLONASE) 50 MCG/ACT nasal spray Place 2 sprays into both nostrils daily. 03/18/19   Fawze, Mina A, PA-C  levETIRAcetam (KEPPRA) 250 MG tablet Take 250 mg by mouth 3 (three) times daily. Take 125 mg( 1/2 tab) po TID x 3 weeks, increase to 250 mg(1 tab) po TID for 3 weeks, increase to 375 mg TID 03/20/20   [provider]  lisinopril-hydrochlorothiazide (PRINZIDE,ZESTORETIC) 20-12.5 MG tablet Take 1 tablet by mouth every morning.     [provider]  loratadine (CLARITIN)  10 MG tablet Take 10 mg by mouth every morning.     [provider]  meclizine (ANTIVERT) 25 MG tablet Take 25 mg by mouth 4 (four) times daily as needed for dizziness.  03/17/19   [provider]  Melatonin 5 MG TABS Take 5 mg by mouth at bedtime.    [provider]  OVER THE COUNTER MEDICATION Place 1 drop into both eyes daily as needed (dry eyes). Over the counter lubricating eye drop    [provider]  pravastatin (PRAVACHOL) 40 MG tablet Take 40 mg by  mouth every morning.     [provider]  tamsulosin (FLOMAX) 0.4 MG CAPS capsule Take 0.4 mg by mouth at bedtime. 12/29/18   [provider]     Allergies:    Allergies  Allergen Reactions  . Aspirin Nausea And Vomiting and Other (See Comments)    "sharp stomach pain and excessive saliva"  . Simvastatin Other (See Comments)    "back and mid section achy pain"    Social History:   Social History   Socioeconomic History  . Marital status: Married    Spouse name: Not on file  . Number of children: 4  . Years of education: Not on file  . Highest education level: Not on file  Occupational History  . Occupation: Retired    Comment: Automotive engineer  Tobacco Use  . Smoking status: Former Smoker    Packs/day: 0.50    Years: 18.00    Pack years: 9.00    Types: Cigarettes    Quit date: 05/02/1974    Years since quitting: 46.0  . Smokeless tobacco: Never Used  Vaping Use  . Vaping Use: Never used  Substance and Sexual Activity  . Alcohol use: No  . Drug use: No  . Sexual activity: Yes  Other Topics Concern  . Not on file  Social History Narrative   Married with 3 daughters, 1 son. 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   Social Determinants of Health   Financial Resource Strain:   . Difficulty of Paying Living Expenses: Not on file  Food Insecurity:   . Worried About Charity fundraiser in the Last Year: Not on file  . Ran Out of Food in the Last Year: Not on file  Transportation Needs:   . Lack of Transportation (Medical): Not on file  . Lack of Transportation (Non-Medical): Not on file  Physical Activity:   . Days of Exercise per Week: Not on file  . Minutes of Exercise per Session: Not on file  Stress:   . Feeling of Stress : Not on file  Social Connections:   . Frequency of Communication with Friends and Family: Not on file  . Frequency of Social Gatherings  with Friends and Family: Not on file  . Attends Religious Services: Not on file  . Active Member of Clubs or Organizations: Not on file  . Attends Archivist Meetings: Not on file  . Marital Status: Not on file  Intimate Partner Violence:   . Fear of Current or Ex-Partner: Not on file  . Emotionally Abused: Not on file  . Physically Abused: Not on file  . Sexually Abused: Not on file    Family History:   The patient's family history includes Breast cancer (age of onset: 45) in his sister; Lung cancer in his brother and sister; Prostate  cancer in his brother, brother, brother, brother, brother and other family members; Prostate cancer (age of onset: 86) in his brother.    ROS:  Please see the history of present illness.  All other ROS reviewed and negative.     Physical Exam/Data:   Vitals:   05/12/20 0058 05/12/20 0100 05/12/20 0106 05/12/20 0115  BP:  (!) 85/39 (!) 100/52 (!) 105/52  Pulse:  66 71 76  Resp:  20 (!) 21 20  Temp:      TempSrc:      SpO2:  100% 100% 100%  Weight: 71.8 kg     Height: _0  (1.727 m)      No intake or output data in the 24 hours ending 05/12/20 0215 Last 3 Weights 05/12/2020 05/03/2020 04/02/2020  Weight (lbs) 158 lb 4.6 oz 158 lb 6.4 oz 158 lb 11.2 oz  Weight (kg) 71.8 kg 71.85 kg 71.986 kg     Body mass index is 24.07 kg/m.  General:  Pleasant african american man in distress. +levine's sign. HEENT: normal Neck: no JVD Vascular: 2+ radial pulses.  Cardiac:  normal S1, S2; RRR; no murmur  Lungs:  clear to auscultation bilaterally, no wheeze. Small crackles at bases.  Abd: soft, nontender, no hepatomegaly  Ext: no edema.  Musculoskeletal:  No deformities, BUE and BLE strength normal and equal Skin: warm and dry  Psych:  Normal affect   EKG:  The ECG that was done was personally reviewed and demonstrates sinus rhythm, normal axis, LAE, inferior STE, no Qs.   Relevant CV Studies: n/a  Laboratory Data:  High Sensitivity  Troponin:   Recent Labs  Lab 05/12/20 0106  TROPONINIHS 11      Chemistry Recent Labs  Lab 05/12/20 0106  NA 136  K 3.4*  CL 101  CO2 23  GLUCOSE 148*  BUN 12  CREATININE 1.23  CALCIUM 8.6*  GFRNONAA 58*  GFRAA >60  ANIONGAP 12    Recent Labs  Lab 05/12/20 0106  PROT 6.7  ALBUMIN 3.6  AST 18  ALT 15  ALKPHOS 55  BILITOT 0.8   Hematology Recent Labs  Lab 05/12/20 0106  WBC 12.3*  RBC 3.81*  HGB 11.1*  HCT 35.8*  MCV 94.0  MCH 29.1  MCHC 31.0  RDW 11.4*  PLT 240   BNPNo results for input(s): BNP, PROBNP in the last 168 hours.  DDimer No results for input(s): DDIMER in the last 168 hours.  Radiology/Studies:  CT ANGIO CHEST AORTA W/CM & OR WO/CM  Result Date: 05/12/2020 CLINICAL DATA:  Chest pain all day, worse with inspiration. EXAM: CT ANGIOGRAPHY CHEST WITH CONTRAST TECHNIQUE: Multidetector CT imaging of the chest was performed using the standard protocol during bolus administration of intravenous contrast. Multiplanar CT image reconstructions and MIPs were obtained to evaluate the vascular anatomy. CONTRAST:  165m OMNIPAQUE IOHEXOL 350 MG/ML SOLN COMPARISON:  PET CT 04/09/2020 FINDINGS: Cardiovascular: Mild cardiac enlargement. Moderate pericardial effusion. Normal caliber thoracic aorta. Maximal diameter of the ascending thoracic aorta is 3.5 cm. No evidence of aortic dissection. No mural thrombus or wall thickening. Great vessel origins are patent. Pulmonary arteries are moderately well opacified without evidence of significant pulmonary embolus. Mediastinum/Nodes: Esophagus is decompressed. No significant lymphadenopathy in the chest. Scattered calcified lymph nodes consistent with postinflammatory change. Lungs/Pleura: Atelectasis or edema in the lung bases. No pleural effusions. No pneumothorax. Airways are patent. 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 Upper Abdomen: No acute process demonstrated in the visualized upper  abdomen.  Musculoskeletal: No destructive bone lesions. Review of the MIP images confirms the above findings. IMPRESSION: 1. No evidence of significant pulmonary embolus. 2. Mild cardiac enlargement with moderate pericardial effusion. 3. Atelectasis or edema in the lung bases. 4. 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 5. Aortic atherosclerosis. Aortic Atherosclerosis (ICD10-I70.0). Results were discussed at the workstation with Dr. Marletta Lor in cardiology at the time of dictation. Electronically Signed   By: Lucienne Capers M.D.   On: 05/12/2020 02:01    If the patient is being seen for chest pain, [place], NSTEMI or STEMI Press F2 to calculate a risk score         :[id number]}    TIMI Risk Score for ST  Elevation MI:   The patient's TIMI risk score is 7, which indicates a 23.4% risk of all cause mortality at 30 days.     Assessment and Plan:   74 year old gentleman who presented with acute onset chest pain and inferior ST changes. Taken to cath lab to r/p epicardial coronary artery disease - LHC with no evidence of obstructive CAD. Given positional CP, ST changes, small effusion on bedside TTE with normal hsTn suspect acute pericarditis.   ##Acute Pericarditis -- Full TTE in the AM -- Has had issues with ASA tolerance, but can trial colchicine + pain control. [colchicine 0.5 BID + ibuprofen 628m TID + PPI] -- Trend hsTn -- Check CRP, ESR -- Awaiting COVID test, but s/p 2 vaccinations.   ##HTN -- Hold antihypertensive for now given hypotension on arrival.   Severity of Illness: The appropriate patient status for this patient is INPATIENT. Inpatient status is judged to be reasonable and necessary in order to provide the required intensity of service to ensure the patient's safety. The patient's presenting symptoms, physical exam findings, and initial radiographic and laboratory data in the context of their chronic comorbidities is felt to place them at high risk for further clinical  deterioration. Furthermore, it is not anticipated that the patient will be medically stable for discharge from the hospital within 2 midnights of admission. The following factors support the patient status of inpatient.   " The patient's presenting symptoms include chest pain. " The worrisome physical exam findings include SOB. " The initial radiographic and laboratory data are worrisome because of ST elevations.  " The chronic co-morbidities include HTN, HLD.    * I certify that at the point of admission it is my clinical judgment that the patient will require inpatient hospital care spanning beyond 2 midnights from the point of admission due to high intensity of service, high risk for further deterioration and high frequency of surveillance required.*    For questions or updates, please contact CWood RiverPlease consult [email or web address] for contact info under     Signed, LMilus Banister MD  05/12/2020 2:15 AM   I have personally seen and examined this patient. I agree with the assessment and plan as outlined above.  74yo male with HTN and metastatic neuroendocrine tumor (suspect GI primary with mets) who presented to the ED with 6 hours of chest pain. EKG with 1 mm inferior ST elevation. Bedside echo with small pericardial effusion and normal LV function. No AI noted. Chest CTA with no evidence of aortic dissection. Pt with ongoing pain and brought to the cath lab for emergent cardiac cath.  Cardiac cath with no evidence of CAD  My exam:  General: Well developed, well nourished, appears uncomfortable HEENT: OP clear, mucus membranes  moist  SKIN: warm, dry. No rashes. Neuro: No focal deficits  Musculoskeletal: Muscle strength 5/5 all ext  Psychiatric: Mood and affect normal  Neck: No JVD, no carotid bruits, no thyromegaly, no lymphadenopathy.  Lungs:Clear bilaterally, no wheezes, rhonci, crackles Cardiovascular: Regular rate and rhythm. No murmurs, gallops or rubs. Abdomen:Soft.  Bowel sounds present. Non-tender.  Extremities: No lower extremity edema. Pulses are 2 + in the bilateral DP/PT.  Plan: Pt with ongoing chest pain but normal coronary arteries. No aortic dissection on chest CTA. Small pericardial effusion on bedside echo by our team in the ED. Suspect pericarditis. Will admit to the ICU and attempt pain control with narcotics. Complete echo in the am. Will start colchicine.   Lauree Chandler 05/12/2020 2:45 AM

## 2020-05-12 NOTE — ED Triage Notes (Signed)
The pt arrived by [facility] from home chest pain all day  Pain worse with inspiration  Recent diagnoses with liver cancer ems gave iv and 500 ml of nss enroute

## 2020-05-12 NOTE — ED Provider Notes (Signed)
[facility] EMERGENCY DEPARTMENT Provider Note   CSN: [id number] Arrival date & time: 05/12/20  0036   History Chief Complaint  Patient presents with   Code STEMI    Ryan Wilkerson is a 74 y.o. male.  The history is provided by the patient.  He has history of hypertension, hyperlipidemia, prostate cancer, neuroendocrine tumor metastatic to liver, and comes in because of chest pain.  He relates that he has been having shortness of breath for the last several weeks which got worse today.  At about 8:30 PM, he had onset of left upper chest pain with some radiation to the back and shoulder.  Pain is rated at 9/10.  Pain is worse when he takes a deep breath.  There is associated nausea and some slight diaphoresis.  He has never had pain like this before.  He is unable to characterize the pain.  He was brought in by ambulance.  He is a former smoker.  There is no history of diabetes and no family history of premature coronary atherosclerosis.  Past Medical History:  Diagnosis Date   Erectile dysfunction    Essential tremor    Family history of breast cancer    Family history of prostate cancer    Family history of stomach cancer    GERD (gastroesophageal reflux disease)    History of colon polyps    Hypercholesterolemia    Hypertension    Nocturia    Prostate cancer [facility]) urologist-- dr Tresa Moore /  oncologist-- dr manning   prostate bx 06-24-2016 and 09-15-2017 at dr Tresa Moore office  Stage T1c, Gleason 4+3, PSA 8.48, vol 33cc---  planned external beam radiation therpay   Urgency of urination    Wears glasses     Patient Active Problem List   Diagnosis Date Noted   Metastatic malignant neuroendocrine tumor to liver ([place]) 05/01/2020   Goals of care, counseling/discussion 05/01/2020   Genetic testing 11/13/2017   Family history of prostate cancer    Family history of breast cancer    Family history of stomach cancer    Malignant neoplasm of  prostate ([place]) 08/31/2017    Past Surgical History:  Procedure Laterality Date   COLONOSCOPY  last one 06/ 2018   GOLD SEED IMPLANT N/A 12/23/2017   Procedure: GOLD SEED IMPLANT;  Surgeon: Alexis Frock, MD;  Location: [facility];  Service: Urology;  Laterality: N/A;   PROSTATE BIOPSY  06-24-2016;  09-15-2017-- at dr Tresa Moore office   [place] N/A 12/23/2017   Procedure: [place];  Surgeon: Alexis Frock, MD;  Location: [facility];  Service: Urology;  Laterality: N/A;       Family History  Problem Relation Age of Onset   Prostate cancer Brother 37   Prostate cancer Brother        metastatic/ late treatment dx in 60's/70's   Breast cancer Sister 75   Prostate cancer Brother        dx in 60's/70's   Prostate cancer Brother        dx 60's/70's   Prostate cancer Brother        dx 60's/70's   Prostate cancer Brother        42's   Lung cancer Brother    Prostate cancer Other    Prostate cancer Other    Lung cancer Sister     Social History   Tobacco Use   Smoking status: Former Smoker  Packs/day: 0.50    Years: 18.00    Pack years: 9.00    Types: Cigarettes    Quit date: 05/02/1974    Years since quitting: 46.0   Smokeless tobacco: Never Used  Vaping Use   Vaping Use: Never used  Substance Use Topics   Alcohol use: No   Drug use: No    Home Medications Prior to Admission medications   Medication Sig Start Date End Date Taking? Authorizing Provider  acetaminophen (TYLENOL) 500 MG tablet Take 1,000 mg by mouth every 6 (six) hours as needed for headache (pain).    [provider]  diclofenac (VOLTAREN) 75 MG EC tablet  03/14/20   [provider]  fluticasone (FLONASE) 50 MCG/ACT nasal spray Place 2 sprays into both nostrils daily. 03/18/19   Fawze, Mina A, PA-C  levETIRAcetam (KEPPRA) 250 MG tablet Take 250 mg by mouth 3 (three) times daily. Take 125 mg( 1/2 tab) po TID x 3  weeks, increase to 250 mg(1 tab) po TID for 3 weeks, increase to 375 mg TID 03/20/20   [provider]  lisinopril-hydrochlorothiazide (PRINZIDE,ZESTORETIC) 20-12.5 MG tablet Take 1 tablet by mouth every morning.     [provider]  loratadine (CLARITIN) 10 MG tablet Take 10 mg by mouth every morning.     [provider]  meclizine (ANTIVERT) 25 MG tablet Take 25 mg by mouth 4 (four) times daily as needed for dizziness.  03/17/19   [provider]  Melatonin 5 MG TABS Take 5 mg by mouth at bedtime.    [provider]  OVER THE COUNTER MEDICATION Place 1 drop into both eyes daily as needed (dry eyes). Over the counter lubricating eye drop    [provider]  pravastatin (PRAVACHOL) 40 MG tablet Take 40 mg by mouth every morning.     [provider]  tamsulosin (FLOMAX) 0.4 MG CAPS capsule Take 0.4 mg by mouth at bedtime. 12/29/18   [provider]    Allergies    Aspirin and Simvastatin  Review of Systems   Review of Systems  All other systems reviewed and are negative.   Physical Exam Updated Vital Signs BP (!) 85/39    Pulse 66    Temp 97.8 F (36.6 C) (Oral)    Resp 20    Ht 5\' 8"  (1.727 m)    Wt 71.8 kg    SpO2 100%    BMI 24.07 kg/m   Physical Exam Vitals and nursing note reviewed.   74 year old male, appears mildly uncomfortable but is in no acute distress. Vital signs are significant for low blood pressure. Oxygen saturation is 100%, which is normal. Head is normocephalic and atraumatic. PERRLA, EOMI. Oropharynx is clear. Neck is nontender and supple without adenopathy or JVD. Back is nontender and there is no CVA tenderness. Lungs are clear without rales, wheezes, or rhonchi. Chest is nontender. Heart has regular rate and rhythm without murmur. Abdomen is soft, flat, nontender without masses or hepatosplenomegaly and peristalsis is normoactive. Extremities have no cyanosis or edema, full range of motion is  present. Skin is warm and dry without rash. Neurologic: Mental status is normal, cranial nerves are intact, there are no motor or sensory deficits.  ED Results / Procedures / Treatments   Labs (all labs ordered are listed, but only abnormal results are displayed) Labs Reviewed  SARS CORONAVIRUS 2 BY RT PCR ([place] [facility])  HEMOGLOBIN A1C  CBC WITH  DIFFERENTIAL/PLATELET  PROTIME-INR  APTT  COMPREHENSIVE METABOLIC PANEL  LIPID PANEL  TROPONIN I (HIGH SENSITIVITY)    EKG EKG Interpretation  Date/Time:  Saturday May 12 2020 00:44:20 EDT Ventricular Rate:  80 PR Interval:    QRS Duration: 90 QT Interval:  416 QTC Calculation: 479 R Axis:   15 Text Interpretation: Sinus rhythm with 1st degree A-V block Inferior infarct , age undetermined Abnormal ECG Inferolateral injury pattern ** ** ACUTE MI / STEMI ** ** When compared with ECG of 03/18/2019. ** ** ACUTE MI / STEMI ** ** is now present Premature ventricular complexes are no longer present Confirmed by Delora Fuel ([id number]) on 05/12/2020 12:56:59 AM   EKG Interpretation  Date/Time:  Saturday May 12 2020 01:03:34 EDT Ventricular Rate:  69 PR Interval:    QRS Duration: 77 QT Interval:  507 QTC Calculation: 544 R Axis:   54 Text Interpretation: Sinus rhythm Abnormal R-wave progression, early transition Inferolateral injury pattern Prolonged QT interval When compared with ECG of EARLIER SAME DATE QT has lengthened Confirmed by Delora Fuel ([id number]) on 05/12/2020 1:29:48 AM       Radiology No results found.  Procedures Procedures  CRITICAL CARE Performed by: Delora Fuel Total critical care time: 45 minutes Critical care time was exclusive of separately billable procedures and treating other patients. Critical care was necessary to treat or prevent imminent or life-threatening deterioration. Critical care was time spent personally by me on the following activities: development  of treatment plan with patient and/or surrogate as well as nursing, discussions with consultants, evaluation of patient's response to treatment, examination of patient, obtaining history from patient or surrogate, ordering and performing treatments and interventions, ordering and review of laboratory studies, ordering and review of radiographic studies, pulse oximetry and re-evaluation of patient's condition.  Medications Ordered in ED Medications  0.9 %  sodium chloride infusion (has no administration in time range)  aspirin chewable tablet 324 mg (has no administration in time range)  heparin 5000 UNIT/ML injection (has no administration in time range)  heparin injection 4,000 Units (4,000 Units Intravenous Given 05/12/20 0104)    ED Course  I have reviewed the triage vital signs and the nursing notes.  Pertinent labs & imaging results that were available during my care of the patient were reviewed by me and considered in my medical decision making (see chart for details).  MDM Rules/Calculators/A&P Chest pain with ECG showing inferolateral ST elevation consistent with ST elevation myocardial infarction.  Code STEMI was activated.  Please note that ECG was obtained at 00;44, but not brought to me for review until 00:56.  Patient expresses intolerance to aspirin so aspirin was not given.  He was given heparin.  Because of hypotension, he is started on IV fluids.  Case was discussed with Dr. Angelena Form, on-call for STEMI who is coming in to evaluate the patient for urgent cardiac catheterization.  Old records are reviewed, confirming a recent diagnosis of neuroendocrine tumor metastatic to the liver, but no prior cardiac evaluation.  Dr. Marletta Lor, cardiology fellow, has arrived and is assuming care of the patient.  Final Clinical Impression(s) / ED Diagnoses Final diagnoses:  ST elevation myocardial infarction (STEMI), unspecified artery (HCC)  Hypotension, unspecified hypotension type    Rx / DC  Orders ED Discharge Orders    None       Delora Fuel, MD 01/60/10 [phone]

## 2020-05-12 NOTE — Progress Notes (Signed)
  Echocardiogram 2D Echocardiogram has been performed.  Ryan Wilkerson 05/12/2020, 9:28 AM

## 2020-05-12 NOTE — ED Notes (Signed)
Ryan Wilkerson, daughter, [phone] would like an update when available

## 2020-05-13 ENCOUNTER — Encounter: Payer: Self-pay | Admitting: Nurse Practitioner

## 2020-05-13 DIAGNOSIS — I213 ST elevation (STEMI) myocardial infarction of unspecified site: Secondary | ICD-10-CM

## 2020-05-13 DIAGNOSIS — I300 Acute nonspecific idiopathic pericarditis: Secondary | ICD-10-CM

## 2020-05-13 LAB — CBC
HCT: 32.8 % — ABNORMAL LOW (ref 39.0–52.0)
Hemoglobin: 10.3 g/dL — ABNORMAL LOW (ref 13.0–17.0)
MCH: 29.4 pg (ref 26.0–34.0)
MCHC: 31.4 g/dL (ref 30.0–36.0)
MCV: 93.7 fL (ref 80.0–100.0)
Platelets: 189 10*3/uL (ref 150–400)
RBC: 3.5 MIL/uL — ABNORMAL LOW (ref 4.22–5.81)
RDW: 11.6 % (ref 11.5–15.5)
WBC: 12.2 10*3/uL — ABNORMAL HIGH (ref 4.0–10.5)
nRBC: 0 % (ref 0.0–0.2)

## 2020-05-13 LAB — PHOSPHORUS: Phosphorus: 2 mg/dL — ABNORMAL LOW (ref 2.5–4.6)

## 2020-05-13 LAB — BASIC METABOLIC PANEL
Anion gap: 10 (ref 5–15)
BUN: 16 mg/dL (ref 8–23)
CO2: 21 mmol/L — ABNORMAL LOW (ref 22–32)
Calcium: 8.5 mg/dL — ABNORMAL LOW (ref 8.9–10.3)
Chloride: 101 mmol/L (ref 98–111)
Creatinine, Ser: 1.35 mg/dL — ABNORMAL HIGH (ref 0.61–1.24)
GFR calc Af Amer: 60 mL/min — ABNORMAL LOW (ref >60)
GFR calc non Af Amer: 52 mL/min — ABNORMAL LOW (ref >60)
Glucose, Bld: 116 mg/dL — ABNORMAL HIGH (ref 70–99)
Potassium: 3.7 mmol/L (ref 3.5–5.1)
Sodium: 132 mmol/L — ABNORMAL LOW (ref 135–145)

## 2020-05-13 LAB — HIGH SENSITIVITY CRP: CRP, High Sensitivity: 19.53 mg/L — ABNORMAL HIGH (ref 0.00–3.00)

## 2020-05-13 LAB — MAGNESIUM: Magnesium: 1.8 mg/dL (ref 1.7–2.4)

## 2020-05-13 MED ORDER — TAMSULOSIN HCL 0.4 MG PO CAPS
0.4000 mg | ORAL_CAPSULE | Freq: Every day | ORAL | Status: DC
  Administered 2020-05-13 – 2020-05-14 (×2): 0.4 mg via ORAL
  Filled 2020-05-13 (×2): qty 1

## 2020-05-13 MED ORDER — K PHOS MONO-SOD PHOS DI & MONO 155-852-130 MG PO TABS
250.0000 mg | ORAL_TABLET | Freq: Two times a day (BID) | ORAL | Status: AC
  Administered 2020-05-13 (×2): 250 mg via ORAL
  Filled 2020-05-13 (×4): qty 1

## 2020-05-13 MED ORDER — ALUM & MAG HYDROXIDE-SIMETH 200-200-20 MG/5ML PO SUSP
30.0000 mL | ORAL | Status: DC | PRN
  Administered 2020-05-13: 30 mL via ORAL
  Filled 2020-05-13: qty 30

## 2020-05-13 MED ORDER — IBUPROFEN 200 MG PO TABS
400.0000 mg | ORAL_TABLET | Freq: Two times a day (BID) | ORAL | Status: DC
  Administered 2020-05-13 – 2020-05-14 (×2): 400 mg via ORAL
  Filled 2020-05-13 (×2): qty 2

## 2020-05-13 MED ORDER — KETOROLAC TROMETHAMINE 15 MG/ML IJ SOLN: 15.0000 mg | Freq: Four times a day (QID) | INTRAMUSCULAR | Status: DC | PRN

## 2020-05-13 MED ORDER — MAGNESIUM HYDROXIDE 400 MG/5ML PO SUSP
30.0000 mL | Freq: Every day | ORAL | Status: DC | PRN
  Administered 2020-05-13: 30 mL via ORAL
  Filled 2020-05-13: qty 30

## 2020-05-13 MED ORDER — MAGNESIUM SULFATE 2 GM/50ML IV SOLN
2.0000 g | Freq: Once | INTRAVENOUS | Status: AC
  Administered 2020-05-13: 2 g via INTRAVENOUS
  Filled 2020-05-13: qty 50

## 2020-05-13 NOTE — Telephone Encounter (Signed)
He had persistent chest pain over the weekend and ED eval showed STEMI, he's been admitted for suspected pericarditis.   Cira Rue, NP

## 2020-05-13 NOTE — Progress Notes (Signed)
Progress Note  Patient Name: Ryan Wilkerson Date of Encounter: 05/13/2020  [facility] Cardiologist: Lauree Chandler, MD   Subjective   Chest pain has improved since yesterday and is only present on deep exertion.   Inpatient Medications    Scheduled Meds: . aspirin  300 mg Rectal Once  . Chlorhexidine Gluconate Cloth  6 each Topical Daily  . clopidogrel  75 mg Oral Daily  . colchicine  0.6 mg Oral BID  . fluticasone  2 spray Each Nare Daily  . ibuprofen  600 mg Oral TID  . levETIRAcetam  375 mg Oral TID  . loratadine  10 mg Oral q morning - 10a  . pantoprazole  40 mg Oral Daily  . sodium chloride flush  3 mL Intravenous Q12H   Continuous Infusions: . sodium chloride    . sodium chloride     PRN Meds: sodium chloride, acetaminophen, guaiFENesin-dextromethorphan, ketorolac, morphine injection, ondansetron (ZOFRAN) IV, sodium chloride flush   Vital Signs    Vitals:   05/13/20 0700 05/13/20 0741 05/13/20 0800 05/13/20 0900  BP: 133/61  125/60 119/62  Pulse: 84  86 80  Resp: 17  14 12   Temp:  98.2 F (36.8 C)    TempSrc:  Oral    SpO2: 100% 100% 98% 97%  Weight:      Height:        Intake/Output Summary (Last 24 hours) at 05/13/2020 0927 Last data filed at 05/13/2020 0900 Gross per 24 hour  Intake 1212.83 ml  Output 225 ml  Net 987.83 ml   Last 3 Weights 05/12/2020 05/12/2020 05/03/2020  Weight (lbs) 156 lb 15.5 oz 158 lb 4.6 oz 158 lb 6.4 oz  Weight (kg) 71.2 kg 71.8 kg 71.85 kg      Telemetry    SR - Personally Reviewed  ECG    ST, mild diffuse ST elevations - Personally Reviewed  Physical Exam   GEN: No acute distress.   Neck: No JVD Cardiac: RRR, no murmurs, rubs, or gallops.  Respiratory: Clear to auscultation bilaterally. GI: Soft, nontender, non-distended  MS: No edema; No deformity. Neuro:  Nonfocal  Psych: Normal affect   Labs    High Sensitivity Troponin:   Recent Labs  Lab 05/12/20 0106 05/12/20 0445  TROPONINIHS 11  30*      Chemistry Recent Labs  Lab 05/12/20 0106  NA 136  K 3.4*  CL 101  CO2 23  GLUCOSE 148*  BUN 12  CREATININE 1.23  CALCIUM 8.6*  PROT 6.7  ALBUMIN 3.6  AST 18  ALT 15  ALKPHOS 55  BILITOT 0.8  GFRNONAA 58*  GFRAA >60  ANIONGAP 12     Hematology Recent Labs  Lab 05/12/20 0106 05/13/20 0812  WBC 12.3* 12.2*  RBC 3.81* 3.50*  HGB 11.1* 10.3*  HCT 35.8* 32.8*  MCV 94.0 93.7  MCH 29.1 29.4  MCHC 31.0 31.4  RDW 11.4* 11.6  PLT 240 189    BNPNo results for input(s): BNP, PROBNP in the last 168 hours.   DDimer No results for input(s): DDIMER in the last 168 hours.   Radiology    CARDIAC CATHETERIZATION  Result Date: 05/12/2020 No angiographic evidence of CAD Recommendations: Pt with ongoing chest pain but normal coronary arteries. No aortic dissection on chest CTA. Suspect pericarditis. Will admit to the ICU and attempt pain control with narcotics. Echo in the am. Will start colchicine.   CT ANGIO CHEST AORTA W/CM & OR WO/CM  Result Date: 05/12/2020  CLINICAL DATA:  Chest pain all day, worse with inspiration. EXAM: CT ANGIOGRAPHY CHEST WITH CONTRAST TECHNIQUE: Multidetector CT imaging of the chest was performed using the standard protocol during bolus administration of intravenous contrast. Multiplanar CT image reconstructions and MIPs were obtained to evaluate the vascular anatomy. CONTRAST:  146mL OMNIPAQUE IOHEXOL 350 MG/ML SOLN COMPARISON:  PET CT 04/09/2020 FINDINGS: Cardiovascular: Mild cardiac enlargement. Moderate pericardial effusion. Normal caliber thoracic aorta. Maximal diameter of the ascending thoracic aorta is 3.5 cm. No evidence of aortic dissection. No mural thrombus or wall thickening. Great vessel origins are patent. Pulmonary arteries are moderately well opacified without evidence of significant pulmonary embolus. Mediastinum/Nodes: Esophagus is decompressed. No significant lymphadenopathy in the chest. Scattered calcified lymph nodes consistent  with postinflammatory change. Lungs/Pleura: Atelectasis or edema in the lung bases. No pleural effusions. No pneumothorax. Airways are patent. 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 Upper Abdomen: No acute process demonstrated in the visualized upper abdomen. Musculoskeletal: No destructive bone lesions. Review of the MIP images confirms the above findings. IMPRESSION: 1. No evidence of significant pulmonary embolus. 2. Mild cardiac enlargement with moderate pericardial effusion. 3. Atelectasis or edema in the lung bases. 4. 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 5. Aortic atherosclerosis. Aortic Atherosclerosis (ICD10-I70.0). Results were discussed at the workstation with Dr. Marletta Lor in cardiology at the time of dictation. Electronically Signed   By: Lucienne Capers M.D.   On: 05/12/2020 02:01   ECHOCARDIOGRAM COMPLETE  Result Date: 05/12/2020    ECHOCARDIOGRAM REPORT   Patient Name:   Ryan Wilkerson Date of Exam: 05/12/2020 Medical Rec #:  [id number]        Height:       68.0 in Accession #:    [id number]       Weight:       157.0 lb Date of Birth:  Apr 29, 1946       BSA:          1.844 m Patient Age:    74 years         BP:           95/78 mmHg Patient Gender: M                HR:           93 bpm. Exam Location:  Inpatient Procedure: 2D Echo Indications:    pericardial effusion423.9  History:        Patient has no prior history of Echocardiogram examinations.                 Signs/Symptoms:Chest Pain.  Sonographer:    Johny Chess Referring Phys: [place]  1. Small pericardial effusion no evidence of tamponade.  2. Abnormal septal motion . Left ventricular ejection fraction, by estimation, is 50 to 55%. The left ventricle has low normal function. The left ventricle has no regional wall motion abnormalities. Left ventricular diastolic parameters were normal.  3. Right ventricular systolic function is normal. The right  ventricular size is normal. There is normal pulmonary artery systolic pressure.  4. A small pericardial effusion is present.  5. The mitral valve is normal in structure. No evidence of mitral valve regurgitation. No evidence of mitral stenosis.  6. Tricuspid valve regurgitation is mild to moderate.  7. The aortic valve is tricuspid. Aortic valve regurgitation is not visualized. Mild aortic valve sclerosis is present, with no evidence of aortic valve stenosis.  8. The inferior vena cava is dilated in size  with >50% respiratory variability, suggesting right atrial pressure of 8 mmHg. FINDINGS  Left Ventricle: Abnormal septal motion. Left ventricular ejection fraction, by estimation, is 50 to 55%. The left ventricle has low normal function. The left ventricle has no regional wall motion abnormalities. The left ventricular internal cavity size was normal in size. There is no left ventricular hypertrophy. Left ventricular diastolic parameters were normal. Right Ventricle: The right ventricular size is normal. No increase in right ventricular wall thickness. Right ventricular systolic function is normal. There is normal pulmonary artery systolic pressure. The tricuspid regurgitant velocity is 2.44 m/s, and  with an assumed right atrial pressure of 8 mmHg, the estimated right ventricular systolic pressure is 27.0 mmHg. Left Atrium: Left atrial size was normal in size. Right Atrium: Right atrial size was normal in size. Pericardium: A small pericardial effusion is present. Mitral Valve: The mitral valve is normal in structure. No evidence of mitral valve regurgitation. No evidence of mitral valve stenosis. Tricuspid Valve: The tricuspid valve is normal in structure. Tricuspid valve regurgitation is mild to moderate. No evidence of tricuspid stenosis. Aortic Valve: The aortic valve is tricuspid. Aortic valve regurgitation is not visualized. Mild aortic valve sclerosis is present, with no evidence of aortic valve stenosis.  Pulmonic Valve: The pulmonic valve was normal in structure. Pulmonic valve regurgitation is trivial. No evidence of pulmonic stenosis. Aorta: The aortic root is normal in size and structure. Venous: The inferior vena cava is dilated in size with greater than 50% respiratory variability, suggesting right atrial pressure of 8 mmHg. IAS/Shunts: No atrial level shunt detected by color flow Doppler. Additional Comments: Small pericardial effusion no evidence of tamponade.  LEFT VENTRICLE PLAX 2D LVIDd:         3.40 cm  Diastology LVIDs:         2.50 cm  LV e' medial:    7.72 cm/s LV PW:         1.00 cm  LV E/e' medial:  8.3 LV IVS:        0.90 cm  LV e' lateral:   7.94 cm/s LVOT diam:     2.00 cm  LV E/e' lateral: 8.0 LV SV:         55 LV SV Index:   30 LVOT Area:     3.14 cm  RIGHT VENTRICLE             IVC RV S prime:     16.20 cm/s  IVC diam: 2.30 cm TAPSE (M-mode): 2.6 cm LEFT ATRIUM         Index      RIGHT ATRIUM           Index LA diam:    2.70 cm 1.46 cm/m RA Area:     14.50 cm                                RA Volume:   37.10 ml  20.12 ml/m  AORTIC VALVE LVOT Vmax:   102.00 cm/s LVOT Vmean:  66.200 cm/s LVOT VTI:    0.174 m  AORTA Ao Root diam: 3.00 cm Ao Asc diam:  3.00 cm MITRAL VALVE               TRICUSPID VALVE MV Area (PHT): 3.85 cm    TR Peak grad:   23.8 mmHg MV Decel Time: 197 msec    TR Vmax:  244.00 cm/s MV E velocity: 63.80 cm/s MV A velocity: 54.40 cm/s  SHUNTS MV E/A ratio:  1.17        Systemic VTI:  0.17 m                            Systemic Diam: 2.00 cm Jenkins Rouge MD Electronically signed by Jenkins Rouge MD Signature Date/Time: 05/12/2020/10:08:20 AM    Final     Cardiac Studies   TTE: 05/12/2020  1. Small pericardial effusion no evidence of tamponade.  2. Abnormal septal motion . Left ventricular ejection fraction, by  estimation, is 50 to 55%. The left ventricle has low normal function. The  left ventricle has no regional wall motion abnormalities. Left ventricular   diastolic parameters were normal.  3. Right ventricular systolic function is normal. The right ventricular  size is normal. There is normal pulmonary artery systolic pressure.  4. A small pericardial effusion is present.  5. The mitral valve is normal in structure. No evidence of mitral valve  regurgitation. No evidence of mitral stenosis.  6. Tricuspid valve regurgitation is mild to moderate.  7. The aortic valve is tricuspid. Aortic valve regurgitation is not  visualized. Mild aortic valve sclerosis is present, with no evidence of  aortic valve stenosis.  8. The inferior vena cava is dilated in size with >50% respiratory  variability, suggesting right atrial pressure of 8 mmHg.   Patient Profile     74 y.o. male admitted with chest pain  Assessment & Plan    Acute pericarditis - normal cath - echo with small amount of pericardial effusion - troponin 11-30 - ECG shows mild diffuse concave ST elevations - continue colchicine 0.6 mg PO BID x 3 months - I will add ibuprofen 400 mg po BID x 5 days - he is feeling better, we will move to telemetry, anticipated discharge tomorrow  For questions or updates, please contact [place] Please consult [email or web address] for contact info under        Signed, Ena Dawley, MD  05/13/2020, 9:27 AM

## 2020-05-13 NOTE — Plan of Care (Signed)
Pt with occasional complaint of pain on deep inspiration only. PRN medication given as prescribed. SBP and HR WNL. Able to ambulate to the bathroom independently. Ambulated around the unit with supervision only. Good UO. No BM this shift. Orders to transfer to telemetry when bed becomes available. Pa tient and daughter updated on plan of care at the bedside.  Problem: Health Behavior/Discharge Planning: Goal: Ability to manage health-related needs will improve Outcome: Progressing   Problem: Activity: Goal: Ability to return to baseline activity level will improve Outcome: Progressing

## 2020-05-14 ENCOUNTER — Encounter (HOSPITAL_COMMUNITY): Payer: Self-pay | Admitting: Cardiovascular Disease

## 2020-05-14 ENCOUNTER — Other Ambulatory Visit: Payer: No Typology Code available for payment source

## 2020-05-14 ENCOUNTER — Ambulatory Visit: Payer: No Typology Code available for payment source | Admitting: Hematology

## 2020-05-14 DIAGNOSIS — E782 Mixed hyperlipidemia: Secondary | ICD-10-CM

## 2020-05-14 MED ORDER — PANTOPRAZOLE SODIUM 40 MG PO TBEC: 40.0000 mg | DELAYED_RELEASE_TABLET | Freq: Every day | ORAL | 0 refills | Status: AC

## 2020-05-14 MED ORDER — IBUPROFEN 400 MG PO TABS
400.0000 mg | ORAL_TABLET | Freq: Two times a day (BID) | ORAL | 0 refills | Status: DC
Start: 2020-05-14 — End: 2020-09-07

## 2020-05-14 MED ORDER — COLCHICINE 0.6 MG PO CAPS: 0.6000 mg | ORAL_CAPSULE | Freq: Two times a day (BID) | ORAL | 1 refills | Status: DC

## 2020-05-14 MED FILL — Verapamil HCl IV Soln 2.5 MG/ML: INTRAVENOUS | Qty: 2 | Status: CN

## 2020-05-14 MED FILL — Nitroglycerin IV Soln 100 MCG/ML in D5W: INTRA_ARTERIAL | Qty: 10 | Status: AC

## 2020-05-14 MED FILL — Heparin Sodium (Porcine) Inj 1000 Unit/ML: INTRAMUSCULAR | Qty: 10 | Status: AC

## 2020-05-14 MED FILL — PANTOPRAZOLE SOD DR 40 MG T: 40 | 30 days supply | Qty: 30 | Fill #0

## 2020-05-14 MED FILL — MITIGARE 0.6 MG CAPSULE: 0.6 | 30 days supply | Qty: 60 | Fill #0

## 2020-05-14 MED FILL — IBUPROFEN 400 MG TABS: 400 | 5 days supply | Qty: 10 | Fill #0

## 2020-05-14 NOTE — Discharge Summary (Addendum)
Discharge Summary    Patient ID: Ryan Wilkerson,  MRN: [id number], DOB/AGE: [birth year] 46 y.o.  Admit date: 05/12/2020 Discharge date: 05/14/2020  Primary Care Provider: Alroy Dust, L.Soper Primary Cardiologist: Dr. Angelena Form   Discharge Diagnoses    Active Problems:   Chest pain of uncertain etiology   Chest pain   Acute idiopathic pericarditis   Allergies Allergies  Allergen Reactions  . Aspirin Nausea And Vomiting and Other (See Comments)    "sharp stomach pain and excessive saliva"  . Simvastatin Other (See Comments)    "back and mid section achy pain"    Diagnostic Studies/Procedures    Cath: 05/12/20  No angiographic evidence of CAD  Recommendations: Pt with ongoing chest pain but normal coronary arteries. No aortic dissection on chest CTA. Suspect pericarditis. Will admit to the ICU and attempt pain control with narcotics. Echo in the am. Will start colchicine.   Echo: 05/12/20  IMPRESSIONS    1. Small pericardial effusion no evidence of tamponade.  2. Abnormal septal motion . Left ventricular ejection fraction, by  estimation, is 50 to 55%. The left ventricle has low normal function. The  left ventricle has no regional wall motion abnormalities. Left ventricular  diastolic parameters were normal.  3. Right ventricular systolic function is normal. The right ventricular  size is normal. There is normal pulmonary artery systolic pressure.  4. A small pericardial effusion is present.  5. The mitral valve is normal in structure. No evidence of mitral valve  regurgitation. No evidence of mitral stenosis.  6. Tricuspid valve regurgitation is mild to moderate.  7. The aortic valve is tricuspid. Aortic valve regurgitation is not  visualized. Mild aortic valve sclerosis is present, with no evidence of  aortic valve stenosis.  8. The inferior vena cava is dilated in size with >50% respiratory  variability, suggesting right atrial pressure of 8 mmHg.    _____________   History of Present Illness     Ryan Wilkerson is a 74 yo male with PMH of smoking with HTN, HLD, prior CVA, and recently diagnosed metastatic neuroendocrine tumor that was in his usual state of health until 6PM the evening prior to admission. He recalled going to the bathroom and straining to have a bowel movement. When he returned to the living room to sit down he had acute onset chest pain that radiated to his back. It was associated with nausea and diaphoresis and was worsening with deep inspiration. He was unable to take a deep breath because of the pain.   Upon arrival to the ED, patient was having 10/10 chest pain with BP 72/60. Initial EKG with concerning ST changes inferiorly. Bedside echo with preserved LV function and small circumferential pericardial effusion. CTA performed given differential BP in upper extremities and nature of pain, which did not show dissection.  He was taken urgently to the cath lab for coronary angiography.   Hospital Course      Underwent cath noted above with normal coronaries. hsTn peaked at 30. CRP 19. Symptoms felt to be consistent with acute pericarditis. Started on colchicine 0.6mg  BID and ibuprofen 400mg  BID with improvement. Echo with normal EF and small pericardial effusion. He was able to ambulate without further episodes of chest pain. Will plan to continue on colchicine 0.6mg  BID for 3 months and Ibuprofen 400mg  BID for a additional 5 days.  _____________  Discharge Vitals Blood pressure (!) 131/56, pulse 90, temperature 98.1 F (36.7 C), temperature source Oral, resp. rate 16, height  5\' 8"  (1.727 m), weight 71.8 kg, SpO2 97 %.  Filed Weights   05/12/20 0058 05/12/20 0300 05/14/20 0343  Weight: 71.8 kg 71.2 kg 71.8 kg    Labs & Radiologic Studies    CBC Recent Labs    05/12/20 0106 05/13/20 0812  WBC 12.3* 12.2*  NEUTROABS 10.2*  --   HGB 11.1* 10.3*  HCT 35.8* 32.8*  MCV 94.0 93.7  PLT 240 572   Basic Metabolic  Panel Recent Labs    05/12/20 0106 05/12/20 0445 05/13/20 0812  NA 136  --  132*  K 3.4*  --  3.7  CL 101  --  101  CO2 23  --  21*  GLUCOSE 148*  --  116*  BUN 12  --  16  CREATININE 1.23  --  1.35*  CALCIUM 8.6*  --  8.5*  MG  --  1.7 1.8  PHOS  --   --  2.0*   Liver Function Tests Recent Labs    05/12/20 0106  AST 18  ALT 15  ALKPHOS 55  BILITOT 0.8  PROT 6.7  ALBUMIN 3.6   No results for input(s): LIPASE, AMYLASE in the last 72 hours. Cardiac Enzymes No results for input(s): CKTOTAL, CKMB, CKMBINDEX, TROPONINI in the last 72 hours. BNP Invalid input(s): POCBNP D-Dimer No results for input(s): DDIMER in the last 72 hours. Hemoglobin A1C Recent Labs    05/12/20 0106  HGBA1C 4.5*   Fasting Lipid Panel Recent Labs    05/12/20 0106  CHOL 129  HDL 48  LDLCALC 71  TRIG 50  CHOLHDL 2.7   Thyroid Function Tests No results for input(s): TSH, T4TOTAL, T3FREE, THYROIDAB in the last 72 hours.  Invalid input(s): FREET3 _____________  CARDIAC CATHETERIZATION  Result Date: 05/12/2020 No angiographic evidence of CAD Recommendations: Pt with ongoing chest pain but normal coronary arteries. No aortic dissection on chest CTA. Suspect pericarditis. Will admit to the ICU and attempt pain control with narcotics. Echo in the am. Will start colchicine.   US BIOPSY (LIVER)  Result Date: 04/30/2020 INDICATION: Concern for metastatic neuroendocrine cancer. Please perform ultrasound-guided liver lesion biopsy for tissue diagnostic purposes. EXAM: ULTRASOUND GUIDED LIVER LESION BIOPSY COMPARISON:  PET-CT-04/09/2020; CT abdomen and pelvis-03/02/2019 MEDICATIONS: None ANESTHESIA/SEDATION: Fentanyl 100 mcg IV; Versed 2 mg IV Total Moderate Sedation time:  14 Minutes. The patient's level of consciousness and vital signs were monitored continuously by radiology nursing throughout the procedure under my direct supervision. COMPLICATIONS: None immediate. PROCEDURE: Informed written  consent was obtained from the patient after a discussion of the risks, benefits and alternatives to treatment. The patient understands and consents the procedure. A timeout was performed prior to the initiation of the procedure. Ultrasound scanning was performed of the right upper abdominal quadrant demonstrates an approximately 2.1 x 1.8 cm hyperechoic lesion within the caudal aspect the right lobe of the liver (image 8), correlating with the hypermetabolic lesion seen on preceding PET-CT image 111, series 4. The procedure was planned. The right upper abdominal quadrant was prepped and draped in the usual sterile fashion. The overlying soft tissues were anesthetized with 1% lidocaine with epinephrine. A 17 gauge, 6.8 cm co-axial needle was advanced into a peripheral aspect of the lesion. This was followed by 5 core biopsies with an 18 gauge core device under direct ultrasound guidance. The coaxial needle tract was embolized with a small amount of Gel-Foam slurry and superficial hemostasis was obtained with manual compression. Post procedural scanning was negative for definitive  area of hemorrhage or additional complication. A dressing was placed. The patient tolerated the procedure well without immediate post procedural complication. IMPRESSION: Technically successful ultrasound guided core needle biopsy of indeterminate 2.1 cm lesion within the caudal aspect of the right lobe of the liver. Electronically Signed   By: Sandi Mariscal M.D.   On: 04/30/2020 13:56   CT ANGIO CHEST AORTA W/CM & OR WO/CM  Result Date: 05/12/2020 CLINICAL DATA:  Chest pain all day, worse with inspiration. EXAM: CT ANGIOGRAPHY CHEST WITH CONTRAST TECHNIQUE: Multidetector CT imaging of the chest was performed using the standard protocol during bolus administration of intravenous contrast. Multiplanar CT image reconstructions and MIPs were obtained to evaluate the vascular anatomy. CONTRAST:  172mL OMNIPAQUE IOHEXOL 350 MG/ML SOLN  COMPARISON:  PET CT 04/09/2020 FINDINGS: Cardiovascular: Mild cardiac enlargement. Moderate pericardial effusion. Normal caliber thoracic aorta. Maximal diameter of the ascending thoracic aorta is 3.5 cm. No evidence of aortic dissection. No mural thrombus or wall thickening. Great vessel origins are patent. Pulmonary arteries are moderately well opacified without evidence of significant pulmonary embolus. Mediastinum/Nodes: Esophagus is decompressed. No significant lymphadenopathy in the chest. Scattered calcified lymph nodes consistent with postinflammatory change. Lungs/Pleura: Atelectasis or edema in the lung bases. No pleural effusions. No pneumothorax. Airways are patent. 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 Upper Abdomen: No acute process demonstrated in the visualized upper abdomen. Musculoskeletal: No destructive bone lesions. Review of the MIP images confirms the above findings. IMPRESSION: 1. No evidence of significant pulmonary embolus. 2. Mild cardiac enlargement with moderate pericardial effusion. 3. Atelectasis or edema in the lung bases. 4. 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 5. Aortic atherosclerosis. Aortic Atherosclerosis (ICD10-I70.0). Results were discussed at the workstation with Dr. Marletta Lor in cardiology at the time of dictation. Electronically Signed   By: Lucienne Capers M.D.   On: 05/12/2020 02:01   ECHOCARDIOGRAM COMPLETE  Result Date: 05/12/2020    ECHOCARDIOGRAM REPORT   Patient Name:   Ryan Wilkerson Date of Exam: 05/12/2020 Medical Rec #:  [id number]        Height:       68.0 in Accession #:    [id number]       Weight:       157.0 lb Date of Birth:  Feb 13, 1946       BSA:          1.844 m Patient Age:    74 years         BP:           95/78 mmHg Patient Gender: M                HR:           93 bpm. Exam Location:  Inpatient Procedure: 2D Echo Indications:    pericardial effusion423.9  History:        Patient has no prior  history of Echocardiogram examinations.                 Signs/Symptoms:Chest Pain.  Sonographer:    Johny Chess Referring Phys: [place]  1. Small pericardial effusion no evidence of tamponade.  2. Abnormal septal motion . Left ventricular ejection fraction, by estimation, is 50 to 55%. The left ventricle has low normal function. The left ventricle has no regional wall motion abnormalities. Left ventricular diastolic parameters were normal.  3. Right ventricular systolic function is normal. The right ventricular size is normal. There is normal pulmonary artery systolic pressure.  4. A small pericardial effusion is present.  5. The mitral valve is normal in structure. No evidence of mitral valve regurgitation. No evidence of mitral stenosis.  6. Tricuspid valve regurgitation is mild to moderate.  7. The aortic valve is tricuspid. Aortic valve regurgitation is not visualized. Mild aortic valve sclerosis is present, with no evidence of aortic valve stenosis.  8. The inferior vena cava is dilated in size with >50% respiratory variability, suggesting right atrial pressure of 8 mmHg. FINDINGS  Left Ventricle: Abnormal septal motion. Left ventricular ejection fraction, by estimation, is 50 to 55%. The left ventricle has low normal function. The left ventricle has no regional wall motion abnormalities. The left ventricular internal cavity size was normal in size. There is no left ventricular hypertrophy. Left ventricular diastolic parameters were normal. Right Ventricle: The right ventricular size is normal. No increase in right ventricular wall thickness. Right ventricular systolic function is normal. There is normal pulmonary artery systolic pressure. The tricuspid regurgitant velocity is 2.44 m/s, and  with an assumed right atrial pressure of 8 mmHg, the estimated right ventricular systolic pressure is 50.2 mmHg. Left Atrium: Left atrial size was normal in size. Right Atrium: Right  atrial size was normal in size. Pericardium: A small pericardial effusion is present. Mitral Valve: The mitral valve is normal in structure. No evidence of mitral valve regurgitation. No evidence of mitral valve stenosis. Tricuspid Valve: The tricuspid valve is normal in structure. Tricuspid valve regurgitation is mild to moderate. No evidence of tricuspid stenosis. Aortic Valve: The aortic valve is tricuspid. Aortic valve regurgitation is not visualized. Mild aortic valve sclerosis is present, with no evidence of aortic valve stenosis. Pulmonic Valve: The pulmonic valve was normal in structure. Pulmonic valve regurgitation is trivial. No evidence of pulmonic stenosis. Aorta: The aortic root is normal in size and structure. Venous: The inferior vena cava is dilated in size with greater than 50% respiratory variability, suggesting right atrial pressure of 8 mmHg. IAS/Shunts: No atrial level shunt detected by color flow Doppler. Additional Comments: Small pericardial effusion no evidence of tamponade.  LEFT VENTRICLE PLAX 2D LVIDd:         3.40 cm  Diastology LVIDs:         2.50 cm  LV e' medial:    7.72 cm/s LV PW:         1.00 cm  LV E/e' medial:  8.3 LV IVS:        0.90 cm  LV e' lateral:   7.94 cm/s LVOT diam:     2.00 cm  LV E/e' lateral: 8.0 LV SV:         55 LV SV Index:   30 LVOT Area:     3.14 cm  RIGHT VENTRICLE             IVC RV S prime:     16.20 cm/s  IVC diam: 2.30 cm TAPSE (M-mode): 2.6 cm LEFT ATRIUM         Index      RIGHT ATRIUM           Index LA diam:    2.70 cm 1.46 cm/m RA Area:     14.50 cm                                RA Volume:   37.10 ml  20.12 ml/m  AORTIC VALVE LVOT Vmax:   102.00 cm/s LVOT  Vmean:  66.200 cm/s LVOT VTI:    0.174 m  AORTA Ao Root diam: 3.00 cm Ao Asc diam:  3.00 cm MITRAL VALVE               TRICUSPID VALVE MV Area (PHT): 3.85 cm    TR Peak grad:   23.8 mmHg MV Decel Time: 197 msec    TR Vmax:        244.00 cm/s MV E velocity: 63.80 cm/s MV A velocity: 54.40 cm/s   SHUNTS MV E/A ratio:  1.17        Systemic VTI:  0.17 m                            Systemic Diam: 2.00 cm Jenkins Rouge MD Electronically signed by Jenkins Rouge MD Signature Date/Time: 05/12/2020/10:08:20 AM    Final    Disposition   Pt is being discharged home today in good condition.  Follow-up Plans & Appointments     Follow-up Information    Charlie Pitter, PA-C Follow up on 06/08/2020.   Specialties: Cardiology, Radiology Why: at 10:45am for your follow up appt. Contact information: [address] [place] [address] [phone]              Discharge Instructions    Call MD for:  redness, tenderness, or signs of infection (pain, swelling, redness, odor or green/yellow discharge around incision site)   Complete by: As directed    Diet - low sodium heart healthy   Complete by: As directed    Discharge instructions   Complete by: As directed    Radial Site Care Refer to this sheet in the next few weeks. These instructions provide you with information on caring for yourself after your procedure. Your caregiver may also give you more specific instructions. Your treatment has been planned according to current medical practices, but problems sometimes occur. Call your caregiver if you have any problems or questions after your procedure. HOME CARE INSTRUCTIONS You may shower the day after the procedure.Remove the bandage (dressing) and gently wash the site with plain soap and water.Gently pat the site dry.  Do not apply powder or lotion to the site.  Do not submerge the affected site in water for 3 to 5 days.  Inspect the site at least twice daily.  Do not flex or bend the affected arm for 24 hours.  No lifting over 5 pounds (2.3 kg) for 5 days after your procedure.  Do not drive home if you are discharged the same day of the procedure. Have someone else drive you.  You may drive 24 hours after the procedure unless otherwise instructed by your caregiver.   What to expect: Any bruising will usually fade within 1 to 2 weeks.  Blood that collects in the tissue (hematoma) may be painful to the touch. It should usually decrease in size and tenderness within 1 to 2 weeks.  SEEK IMMEDIATE MEDICAL CARE IF: You have unusual pain at the radial site.  You have redness, warmth, swelling, or pain at the radial site.  You have drainage (other than a small amount of blood on the dressing).  You have chills.  You have a fever or persistent symptoms for more than 72 hours.  You have a fever and your symptoms suddenly get worse.  Your arm becomes pale, cool, tingly, or numb.  You have heavy bleeding from the site. Hold pressure  on the site.   Increase activity slowly   Complete by: As directed       Discharge Medications   Allergies as of 05/14/2020      Reactions   Aspirin Nausea And Vomiting, Other (See Comments)   "sharp stomach pain and excessive saliva"   Simvastatin Other (See Comments)   "back and mid section achy pain"      Medication List    STOP taking these medications   diclofenac 75 MG EC tablet Commonly known as: VOLTAREN   meclizine 25 MG tablet Commonly known as: ANTIVERT     TAKE these medications   acetaminophen 500 MG tablet Commonly known as: TYLENOL Take 1,000 mg by mouth every 6 (six) hours as needed for headache (pain).   clopidogrel 75 MG tablet Commonly known as: PLAVIX Take 75 mg by mouth daily.   Colchicine 0.6 MG Caps Commonly known as: Mitigare Take 0.6 mg by mouth 2 (two) times daily.   diazepam 5 MG tablet Commonly known as: VALIUM Take 5 mg by mouth every 8 (eight) hours as needed (dizziness).   fluticasone 50 MCG/ACT nasal spray Commonly known as: FLONASE Place 2 sprays into both nostrils daily.   ibuprofen 400 MG tablet Commonly known as: ADVIL Take 1 tablet (400 mg total) by mouth 2 (two) times daily.   levETIRAcetam 250 MG tablet Commonly known as: KEPPRA Take 375 mg by mouth 3 (three)  times daily. Take 125 mg( 1/2 tab) po TID x 3 weeks, increase to 250 mg(1 tab) po TID for 3 weeks, increase to 375 mg TID   lisinopril-hydrochlorothiazide 20-12.5 MG tablet Commonly known as: ZESTORETIC Take 0.5 tablets by mouth every morning.   loratadine 10 MG tablet Commonly known as: CLARITIN Take 10 mg by mouth every morning.   melatonin 5 MG Tabs Take 5 mg by mouth at bedtime.   OVER THE COUNTER MEDICATION Place 1 drop into both eyes daily as needed (dry eyes). Over the counter lubricating eye drop   pantoprazole 40 MG tablet Commonly known as: PROTONIX Take 1 tablet (40 mg total) by mouth daily. Start taking on: May 15, 2020   pravastatin 40 MG tablet Commonly known as: PRAVACHOL Take 40 mg by mouth every morning.   tamsulosin 0.4 MG Caps capsule Commonly known as: FLOMAX Take 0.4 mg by mouth at bedtime.   traMADol 50 MG tablet Commonly known as: ULTRAM Take 50-100 mg by mouth every 6 (six) hours as needed for moderate pain or severe pain.        Outstanding Labs/Studies   N/a   Duration of Discharge Encounter   Greater than 30 minutes including physician time.  Signed, Reino Bellis NP-C 05/14/2020, 1:52 PM   I have examined the patient and reviewed assessment and plan and discussed with patient.  Agree with above as stated.    I have examined the patient and reviewed assessment and plan and discussed with patient.  Agree with above as stated.  Feels somewhat unsteady on his feet.  Constipation improved this AM with BM.  Stomach upset may be related to new meds, colchicine and NSAIDs.  If sx persist, may need to decrease the dose of colchicine. No diarrhea.   Felt well after walking.  Will plan for discharge on medical therapy.  Larae Grooms

## 2020-05-14 NOTE — Progress Notes (Addendum)
Progress Note  Patient Name: Ryan Wilkerson Date of Encounter: 05/14/2020  [facility] Cardiologist: Lauree Chandler, MD   Subjective   Chest pain improved. C/o pain under right ribs.   Inpatient Medications    Scheduled Meds: . aspirin  300 mg Rectal Once  . Chlorhexidine Gluconate Cloth  6 each Topical Daily  . clopidogrel  75 mg Oral Daily  . colchicine  0.6 mg Oral BID  . fluticasone  2 spray Each Nare Daily  . ibuprofen  400 mg Oral BID  . levETIRAcetam  375 mg Oral TID  . loratadine  10 mg Oral q morning - 10a  . pantoprazole  40 mg Oral Daily  . sodium chloride flush  3 mL Intravenous Q12H  . tamsulosin  0.4 mg Oral Daily   Continuous Infusions: . sodium chloride    . sodium chloride     PRN Meds: sodium chloride, acetaminophen, alum & mag hydroxide-simeth, guaiFENesin-dextromethorphan, ketorolac, magnesium hydroxide, morphine injection, ondansetron (ZOFRAN) IV, sodium chloride flush   Vital Signs    Vitals:   05/13/20 2100 05/13/20 2150 05/13/20 2323 05/14/20 0343  BP: (!) 107/53 104/76 107/66 (!) 118/59  Pulse: 80 77 93 90  Resp: 14 16  16   Temp:  98.4 F (36.9 C) 98.2 F (36.8 C) 98.5 F (36.9 C)  TempSrc:  Oral Oral Oral  SpO2: 100% 100% 96% 97%  Weight:    71.8 kg  Height:        Intake/Output Summary (Last 24 hours) at 05/14/2020 0945 Last data filed at 05/13/2020 2000 Gross per 24 hour  Intake 1570 ml  Output 1875 ml  Net -305 ml   Last 3 Weights 05/14/2020 05/12/2020 05/12/2020  Weight (lbs) 158 lb 4.6 oz 156 lb 15.5 oz 158 lb 4.6 oz  Weight (kg) 71.8 kg 71.2 kg 71.8 kg      Telemetry    SR - Personally Reviewed  ECG    No new tracing this morning.  Physical Exam  Pleasant older AAM GEN: No acute distress.   Neck: No JVD Cardiac: RRR, no murmurs, rubs, or gallops.  Respiratory: Clear to auscultation bilaterally. GI: Soft, nontender, non-distended  MS: No edema; No deformity. Right radial site stable.  Neuro:   Nonfocal  Psych: Normal affect   Labs    High Sensitivity Troponin:   Recent Labs  Lab 05/12/20 0106 05/12/20 0445  TROPONINIHS 11 30*      Chemistry Recent Labs  Lab 05/12/20 0106 05/13/20 0812  NA 136 132*  K 3.4* 3.7  CL 101 101  CO2 23 21*  GLUCOSE 148* 116*  BUN 12 16  CREATININE 1.23 1.35*  CALCIUM 8.6* 8.5*  PROT 6.7  --   ALBUMIN 3.6  --   AST 18  --   ALT 15  --   ALKPHOS 55  --   BILITOT 0.8  --   GFRNONAA 58* 52*  GFRAA >60 60*  ANIONGAP 12 10     Hematology Recent Labs  Lab 05/12/20 0106 05/13/20 0812  WBC 12.3* 12.2*  RBC 3.81* 3.50*  HGB 11.1* 10.3*  HCT 35.8* 32.8*  MCV 94.0 93.7  MCH 29.1 29.4  MCHC 31.0 31.4  RDW 11.4* 11.6  PLT 240 189    BNPNo results for input(s): BNP, PROBNP in the last 168 hours.   DDimer No results for input(s): DDIMER in the last 168 hours.   Radiology    No results found.  Cardiac Studies  Cath: 05/12/20  No angiographic evidence of CAD  Recommendations: Pt with ongoing chest pain but normal coronary arteries. No aortic dissection on chest CTA. Suspect pericarditis. Will admit to the ICU and attempt pain control with narcotics. Echo in the am. Will start colchicine.   Echo: 05/12/20  IMPRESSIONS    1. Small pericardial effusion no evidence of tamponade.  2. Abnormal septal motion . Left ventricular ejection fraction, by  estimation, is 50 to 55%. The left ventricle has low normal function. The  left ventricle has no regional wall motion abnormalities. Left ventricular  diastolic parameters were normal.  3. Right ventricular systolic function is normal. The right ventricular  size is normal. There is normal pulmonary artery systolic pressure.  4. A small pericardial effusion is present.  5. The mitral valve is normal in structure. No evidence of mitral valve  regurgitation. No evidence of mitral stenosis.  6. Tricuspid valve regurgitation is mild to moderate.  7. The aortic valve is  tricuspid. Aortic valve regurgitation is not  visualized. Mild aortic valve sclerosis is present, with no evidence of  aortic valve stenosis.  8. The inferior vena cava is dilated in size with >50% respiratory  variability, suggesting right atrial pressure of 8 mmHg.   Patient Profile     74 y.o. 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   Assessment & Plan    1. Acute pericarditis: Underwent cath noted above with normal coronaries. hsTn peaked at 30. CRP 19. Started on colchicine 0.6mg  BID and ibuprofen 400mg  BID with improvement. Echo with normal EF and small pericardial effusion.  -- ambulate in the hallway, possible DC home today  2. Metastatic neuroendocrine tumor: recently started therapy. Follows with Dr. Burr Medico at [facility].  3. HL: on pravastatin prior to admission  For questions or updates, please contact [place] Please consult [email or web address] for contact info under      Signed, Reino Bellis, NP  05/14/2020, 9:45 AM    I have examined the patient and reviewed assessment and plan and discussed with patient.  Agree with above as stated.  Feels somewhat unsteady on his feet.  COnstipation improved this AM with BM.  Stomach upset may be related to new meds, colchicine and NSAIDs.  If sx persist, may need to decrease the dose of colchicine. No diarrhea.   Possible d/c later today if he feels ok while walking and is not having any nausea.   Larae Grooms

## 2020-05-14 NOTE — Progress Notes (Signed)
Pt has been ambulating on a hallway without c/o chest pain.  States nausea is better now.  Idolina Primer, RN

## 2020-05-14 NOTE — Plan of Care (Signed)

## 2020-05-14 NOTE — Progress Notes (Signed)
Brief oncology note:  Ryan Wilkerson is known to our service.  We see him for well differentiated neuroendocrine tumor.  He recently started Sandostatin in our office.  So far, he has tolerated the medication well.  He still states that he has flushing which has not really changed.  He reports constipation.  The patient was admitted for chest pain.  He was taken to the Cath Lab which showed normal coronaries.  Symptoms felt to be consistent with acute pericarditis.  He was started on colchicine and ibuprofen with improvement.  The patient has been seen by cardiology already today who plans for hospital discharge today.  I advised Mr. Mcfarland to keep his outpatient follow-up appointments with [place].  He knows to call us at [phone] for any questions or concerns.  [name], DNP, AGPCNP-BC, AOCNP Mon/Tues/Thurs/Fri 7am-5pm; Off Wednesdays Cell: [phone]

## 2020-05-16 ENCOUNTER — Other Ambulatory Visit: Payer: Self-pay

## 2020-05-16 ENCOUNTER — Ambulatory Visit (HOSPITAL_COMMUNITY)
Admission: RE | Admit: 2020-05-16 | Discharge: 2020-05-16 | Disposition: A | Discharge status: Home / Self Care | Payer: Medicare HMO | Source: Ambulatory Visit | Attending: Hematology | Admitting: Hematology

## 2020-05-16 ENCOUNTER — Telehealth: Payer: Self-pay

## 2020-05-16 ENCOUNTER — Other Ambulatory Visit: Payer: Self-pay | Admitting: Hematology

## 2020-05-16 DIAGNOSIS — C7B8 Other secondary neuroendocrine tumors: Secondary | ICD-10-CM | POA: Insufficient documentation

## 2020-05-16 DIAGNOSIS — C787 Secondary malignant neoplasm of liver and intrahepatic bile duct: Secondary | ICD-10-CM | POA: Diagnosis not present

## 2020-05-16 DIAGNOSIS — C7A8 Other malignant neuroendocrine tumors: Secondary | ICD-10-CM | POA: Diagnosis not present

## 2020-05-16 IMAGING — MR MR ABDOMEN WO/W CM
18 of 21 series · 44 of 48 positions shown · IV contrast (gadavist)
Comparison: PET-CT, [DATE], ultrasound-guided biopsy of the
liver, [DATE]

CLINICAL DATA: Biopsy proven metastatic neuroendocrine tumor of the
liver, PET avid pancreatic tail lesion on prior PET-CT, rule out
primary pancreatic neuroendocrine tumor

EXAM:
MRI ABDOMEN WITHOUT AND WITH CONTRAST
TECHNIQUE: Multiplanar multisequence MR imaging of the abdomen was performed
both before and after the administration of intravenous contrast.
CONTRAST:  7mL GADAVIST GADOBUTROL 1 MMOL/ML IV SOLN

[Series 3: T2 · coronal · 7.0mm · 1.46mm/px · 2 of 24 slices shown (1 of 2)]
[im 1/24]
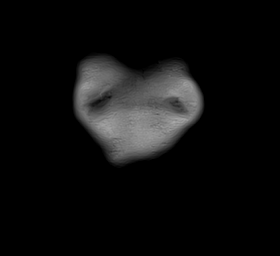
[im 24/24]
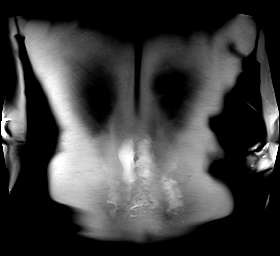

[Series 4: T2 fat-sat · axial · 6.0mm · 1.12mm/px · 1 of 36 slices shown]
[im 1/36]
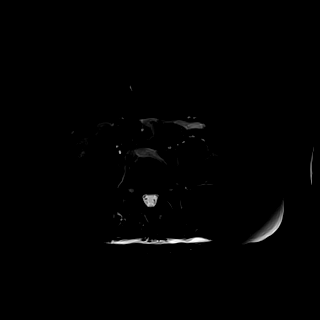

[Series 6: T1 · axial · 3.1mm · 1.25mm/px · z∈[-221,+49]mm · 3 of 88 slices shown (1 of 2)]
[im 1/88]
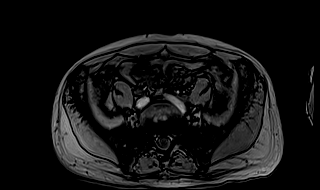
[im 44/88]
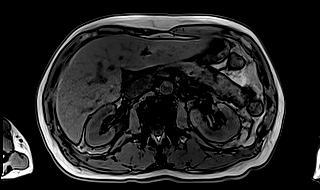
[im 88/88]
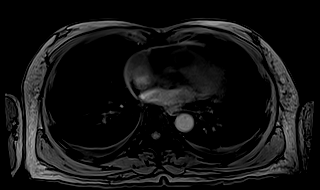

[Series 7: T1 · axial · 3.1mm · 1.25mm/px · z∈[-221,+49]mm · 3 of 88 slices shown (2 of 2)]
[im 1/88]
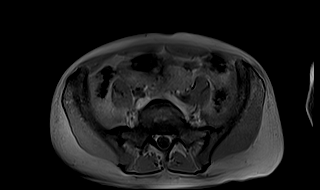
[im 44/88]
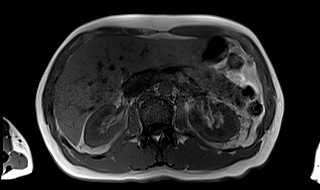
[im 88/88]
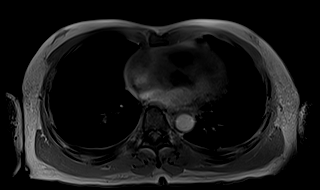

[Series 8: DWI · axial · 6.0mm · 1.49mm/px · z∈[-212,+40]mm · 3 of 72 slices shown (1 of 2)]
[im 1/72]
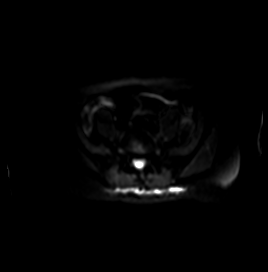
[im 36/72]
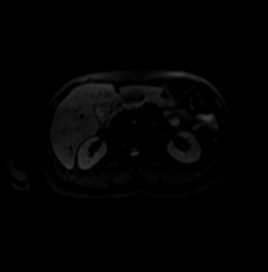
[im 72/72]
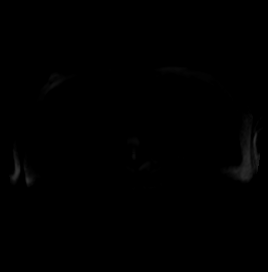

[Series 9: DWI · axial · 6.0mm · 1.49mm/px · 1 of 36 slices shown (2 of 2)]
[im 1/36]
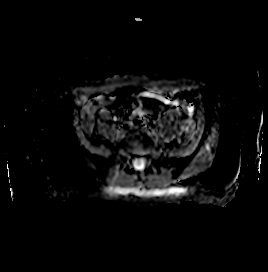

[Series 10: bSSFP · axial · 7.0mm · 1.25mm/px · 1 of 30 slices shown]
[im 1/30]
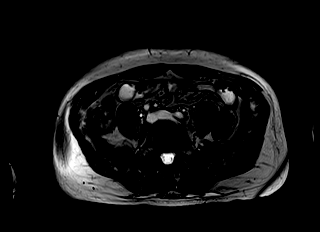

[Series 13: T1 dynamic · axial · 3.0mm · 1.25mm/px · z∈[-220,+41]mm · 3 of 88 slices shown (1 of 6)]
[im 1/88]
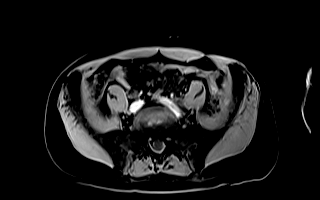
[im 44/88]
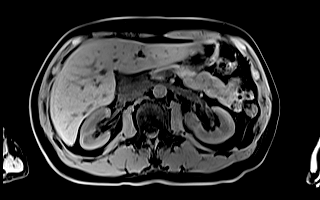
[im 88/88]
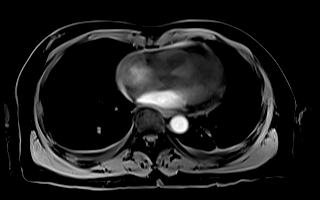

[Series 16: T1 dynamic · axial · 3.0mm · 1.25mm/px · z∈[-220,+41]mm · 3 of 88 slices shown (2 of 6)]
[im 1/88]
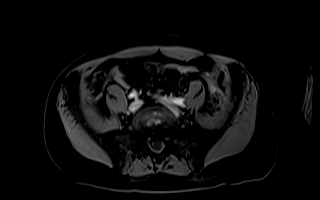
[im 44/88]
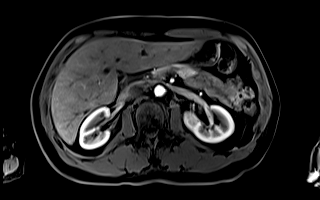
[im 88/88]
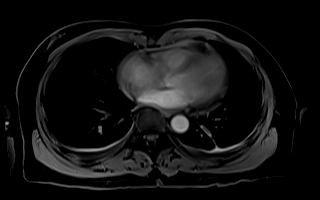

[Series 18: T1 dynamic · axial · 3.0mm · 1.25mm/px · z∈[-220,+41]mm · 3 of 88 slices shown (3 of 6)]
[im 1/88]
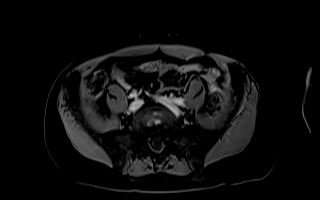
[im 44/88]
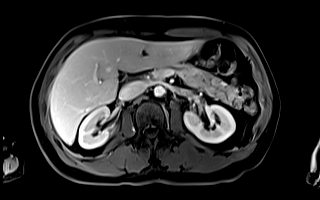
[im 88/88]
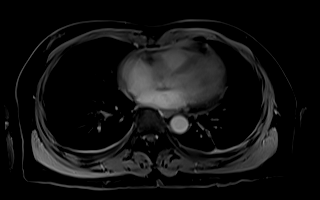

[Series 21: T1 dynamic · axial · 3.0mm · 1.25mm/px · z∈[-220,+41]mm · 3 of 88 slices shown (4 of 6)]
[im 1/88]
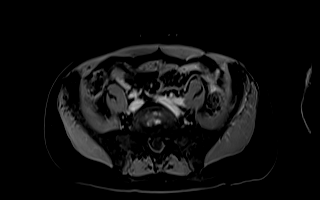
[im 44/88]
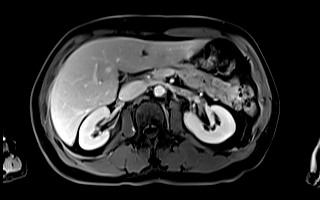
[im 88/88]
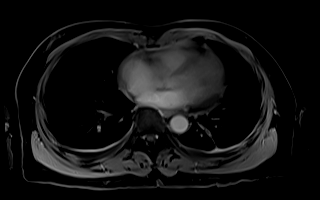

[Series 23: T1 dynamic · coronal · 5.0mm · 1.41mm/px · 2 of 44 slices shown (5 of 6)]
[im 1/44]
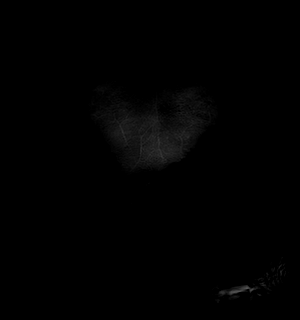
[im 44/44]
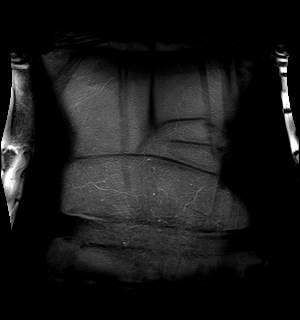

[Series 24: T2 · axial · 6.0mm · 1.56mm/px · 1 of 35 slices shown (2 of 2)]
[im 1/35]
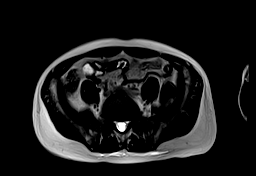

[Series 26: T1 dynamic · axial · 3.0mm · 1.25mm/px · z∈[-220,+41]mm · 3 of 88 slices shown (6 of 6)]
[im 1/88]
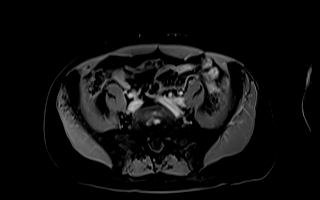
[im 44/88]
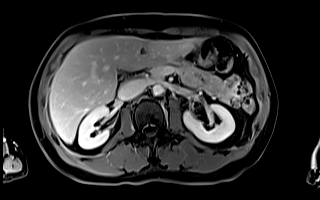
[im 88/88]
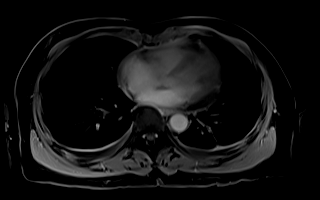

[Series 102: sub_20 sec · axial · 3.0mm · 1.25mm/px · z∈[-220,+41]mm · 3 of 88 slices shown]
[im 1/88]
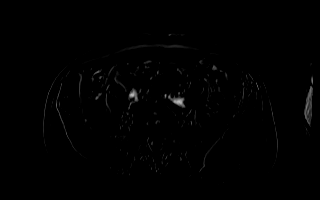
[im 44/88]
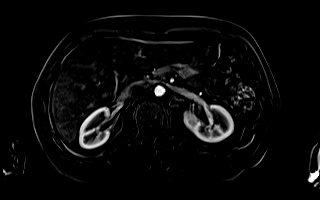
[im 88/88]
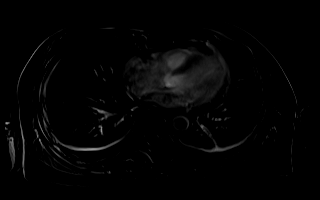

[Series 103: sub_45 sec · axial · 3.0mm · 1.25mm/px · z∈[-220,+41]mm · 3 of 88 slices shown]
[im 1/88]
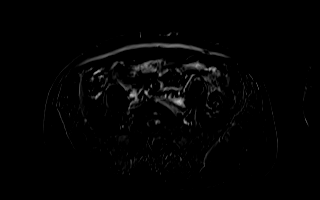
[im 44/88]
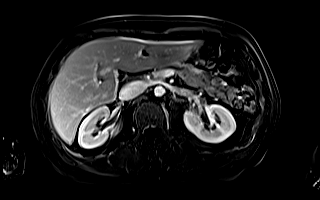
[im 88/88]
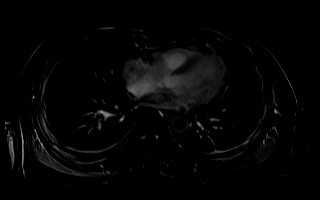

[Series 105: sub_90 sec sub · axial · 3.0mm · 1.25mm/px · z∈[-220,+41]mm · 3 of 88 slices shown]
[im 1/88]
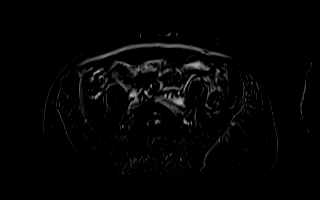
[im 44/88]
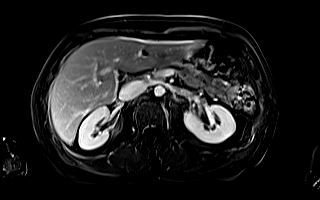
[im 88/88]
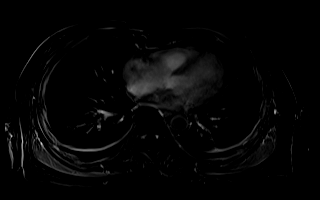

[Series 106: sub_delay sub · axial · 3.0mm · 1.25mm/px · z∈[-220,+41]mm · 3 of 88 slices shown]
[im 1/88]
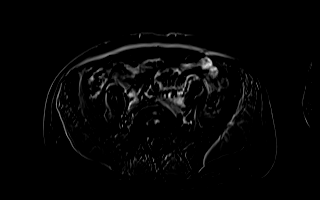
[im 44/88]
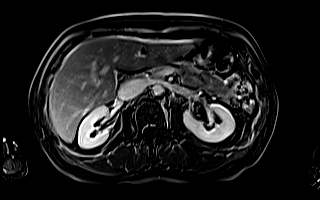
[im 88/88]
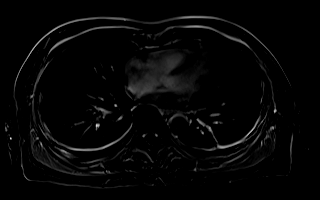

[44 of 48 positions shown; findings below may reference images not displayed]

FINDINGS: Lower chest: Small bilateral pleural effusions and associated
atelectasis or consolidation.

Hepatobiliary: There is an intrinsically T1 and T2 hyperintense
lesion of the inferior right lobe of the liver, hepatic segment VII,
corresponding to ultrasound-guided biopsy site and associated
minimal hemorrhage with Gel-Foam injection. The underlying lesion is
approximately 1.8 x 1.6 cm (series 16, image 63). There is an
additional contrast enhancing lesion of the lateral dome of the
liver measuring 1.6 x 1.5 cm, corresponding to a PET avid lesion
(series 16, image 22). No biliary ductal dilatation.

Pancreas: No mass, inflammatory changes, or other parenchymal
abnormality identified. No abnormal contrast enhancement. No
pancreatic ductal dilatation.

Spleen:  Within normal limits in size and appearance.

Adrenals/Urinary Tract: No masses identified. Simple cyst of the
right kidney. No evidence of hydronephrosis.

Stomach/Bowel: Visualized portions within the abdomen are
unremarkable.

Vascular/Lymphatic: No pathologically enlarged lymph nodes
identified. Aortic atherosclerosis. No abdominal aortic aneurysm
demonstrated.

Other:  None.

Musculoskeletal: No suspicious bone lesions identified.
IMPRESSION: 1. There is an intrinsically T1 and T2 hyperintense lesion of the
inferior right lobe of the liver, hepatic segment VII, corresponding
to a ultrasound-guided biopsy site and associated minimal hemorrhage
with Gel-Foam injection. The underlying lesion is approximately
x 1.6 cm and is in keeping with biopsy proven neuroendocrine
metastasis.
2. There is an additional contrast enhancing lesion of the lateral
dome of the liver measuring 1.6 x 1.5 cm, corresponding to a PET
avid lesion and consistent with an additional metastasis.
3. There is no mass or abnormal contrast enhancement of the
pancreatic tail to correspond to suspected PET avid lesion but the
possibility of an MR occult primary lesion of the pancreas is
nonetheless difficult to exclude.
4. No lymphadenopathy or other evidence of metastatic disease in the
abdomen.
5. Small bilateral pleural effusions and associated atelectasis or
consolidation.
6.  Aortic Atherosclerosis ([Z9]-[Z9]).

## 2020-05-16 IMAGING — MR MR 3D RECON AT SCANNER
18 of 21 series · 44 of 48 positions shown · IV contrast (gadavist)
Comparison: PET-CT, [DATE], ultrasound-guided biopsy of the
liver, [DATE]

CLINICAL DATA: Biopsy proven metastatic neuroendocrine tumor of the
liver, PET avid pancreatic tail lesion on prior PET-CT, rule out
primary pancreatic neuroendocrine tumor

EXAM:
MRI ABDOMEN WITHOUT AND WITH CONTRAST
TECHNIQUE: Multiplanar multisequence MR imaging of the abdomen was performed
both before and after the administration of intravenous contrast.
CONTRAST:  7mL GADAVIST GADOBUTROL 1 MMOL/ML IV SOLN

[Series 3: T2 · coronal · 7.0mm · 1.46mm/px · 2 of 24 slices shown (1 of 2)]
[im 1/24]
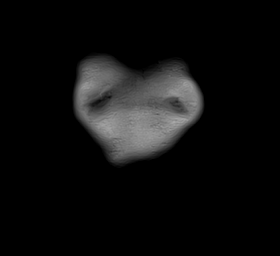
[im 24/24]
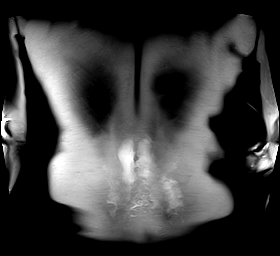

[Series 4: T2 fat-sat · axial · 6.0mm · 1.12mm/px · 1 of 36 slices shown]
[im 1/36]
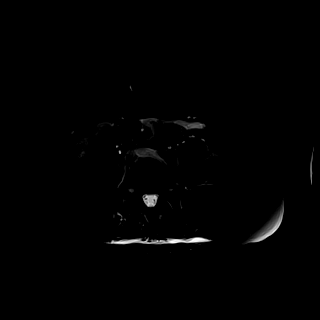

[Series 6: T1 · axial · 3.1mm · 1.25mm/px · z∈[-221,+49]mm · 3 of 88 slices shown (1 of 2)]
[im 1/88]
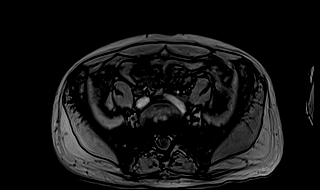
[im 44/88]
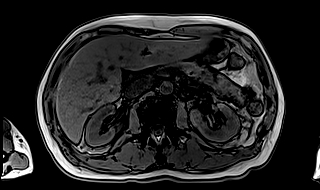
[im 88/88]
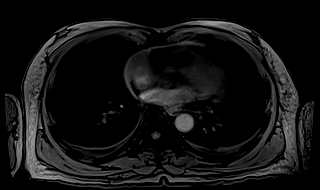

[Series 7: T1 · axial · 3.1mm · 1.25mm/px · z∈[-221,+49]mm · 3 of 88 slices shown (2 of 2)]
[im 1/88]
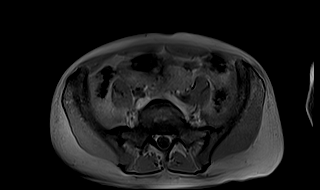
[im 44/88]
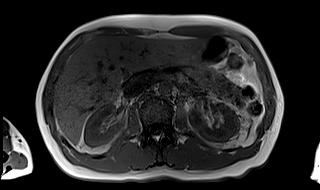
[im 88/88]
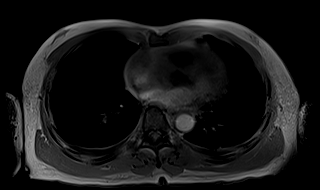

[Series 8: DWI · axial · 6.0mm · 1.49mm/px · z∈[-212,+40]mm · 3 of 72 slices shown (1 of 2)]
[im 1/72]
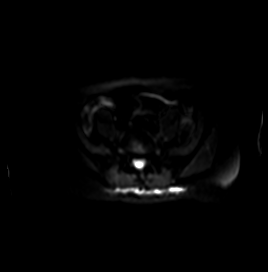
[im 36/72]
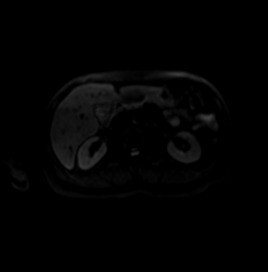
[im 72/72]
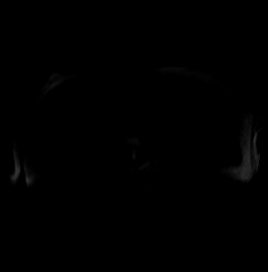

[Series 9: DWI · axial · 6.0mm · 1.49mm/px · 1 of 36 slices shown (2 of 2)]
[im 1/36]
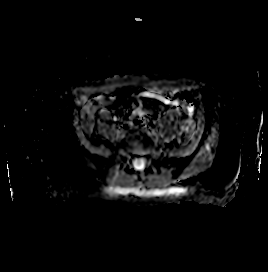

[Series 10: bSSFP · axial · 7.0mm · 1.25mm/px · 1 of 30 slices shown]
[im 1/30]
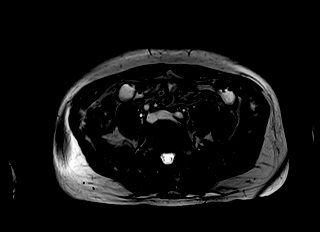

[Series 13: T1 dynamic · axial · 3.0mm · 1.25mm/px · z∈[-220,+41]mm · 3 of 88 slices shown (1 of 6)]
[im 1/88]
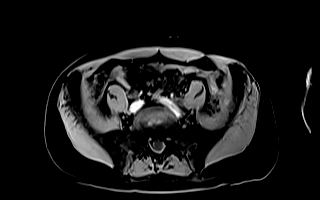
[im 44/88]
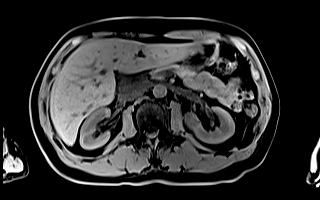
[im 88/88]
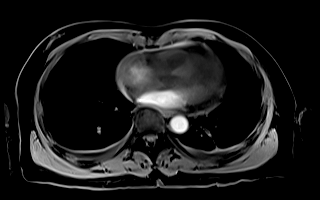

[Series 16: T1 dynamic · axial · 3.0mm · 1.25mm/px · z∈[-220,+41]mm · 3 of 88 slices shown (2 of 6)]
[im 1/88]
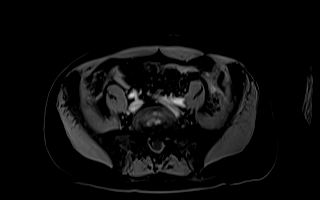
[im 44/88]
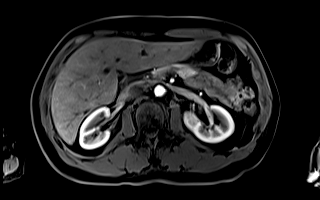
[im 88/88]
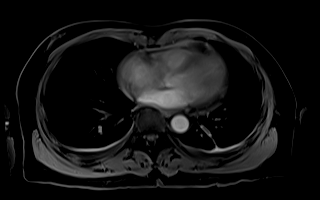

[Series 18: T1 dynamic · axial · 3.0mm · 1.25mm/px · z∈[-220,+41]mm · 3 of 88 slices shown (3 of 6)]
[im 1/88]
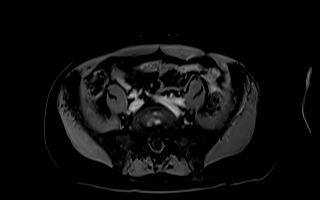
[im 44/88]
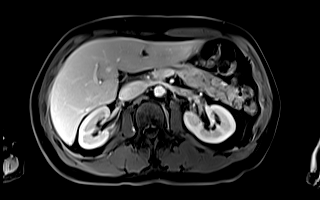
[im 88/88]
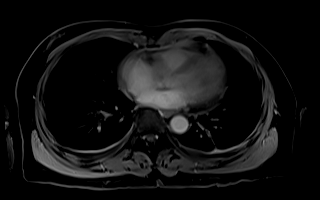

[Series 21: T1 dynamic · axial · 3.0mm · 1.25mm/px · z∈[-220,+41]mm · 3 of 88 slices shown (4 of 6)]
[im 1/88]
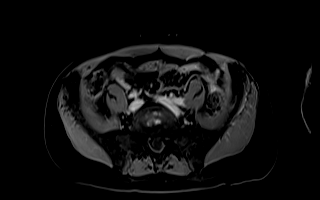
[im 44/88]
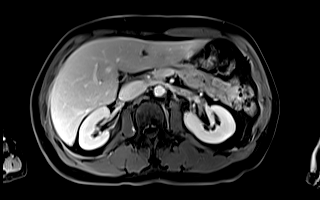
[im 88/88]
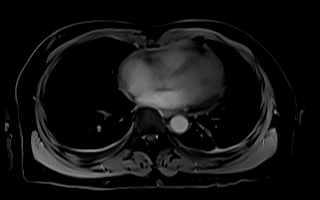

[Series 23: T1 dynamic · coronal · 5.0mm · 1.41mm/px · 2 of 44 slices shown (5 of 6)]
[im 1/44]
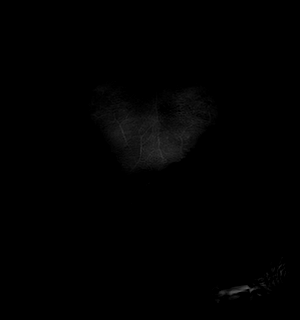
[im 44/44]
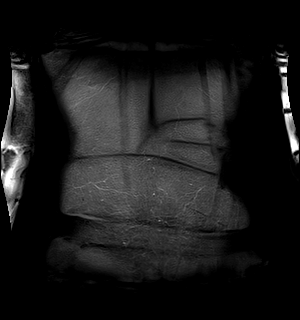

[Series 24: T2 · axial · 6.0mm · 1.56mm/px · 1 of 35 slices shown (2 of 2)]
[im 1/35]
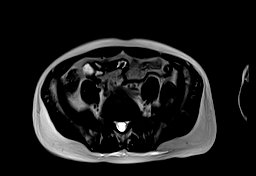

[Series 26: T1 dynamic · axial · 3.0mm · 1.25mm/px · z∈[-220,+41]mm · 3 of 88 slices shown (6 of 6)]
[im 1/88]
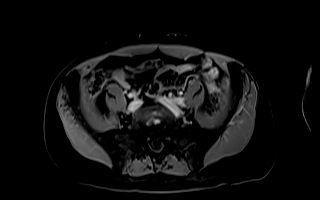
[im 44/88]
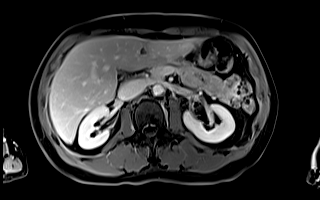
[im 88/88]
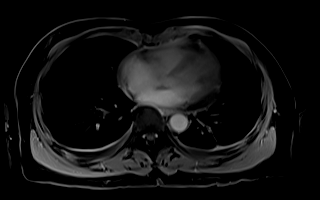

[Series 102: sub_20 sec · axial · 3.0mm · 1.25mm/px · z∈[-220,+41]mm · 3 of 88 slices shown]
[im 1/88]
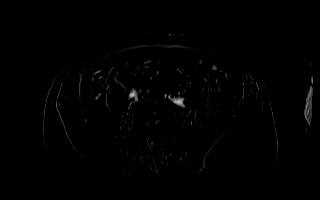
[im 44/88]
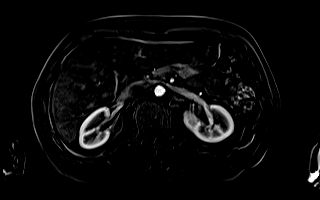
[im 88/88]
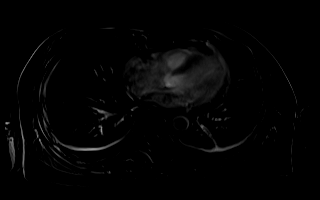

[Series 103: sub_45 sec · axial · 3.0mm · 1.25mm/px · z∈[-220,+41]mm · 3 of 88 slices shown]
[im 1/88]
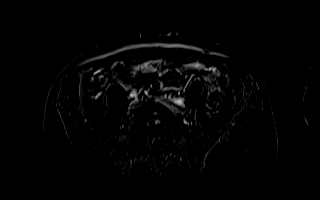
[im 44/88]
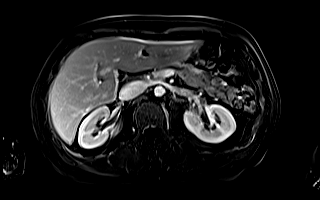
[im 88/88]
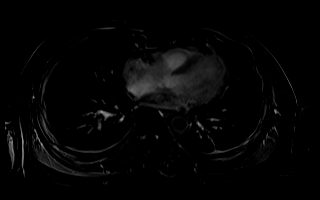

[Series 105: sub_90 sec sub · axial · 3.0mm · 1.25mm/px · z∈[-220,+41]mm · 3 of 88 slices shown]
[im 1/88]
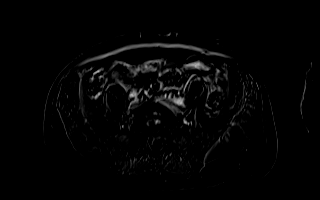
[im 44/88]
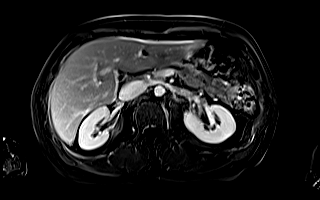
[im 88/88]
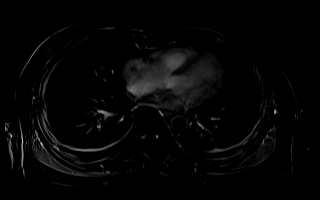

[Series 106: sub_delay sub · axial · 3.0mm · 1.25mm/px · z∈[-220,+41]mm · 3 of 88 slices shown]
[im 1/88]
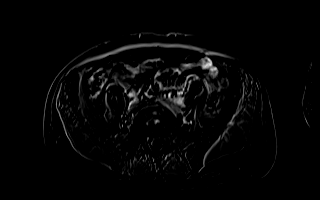
[im 44/88]
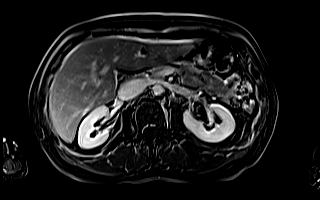
[im 88/88]
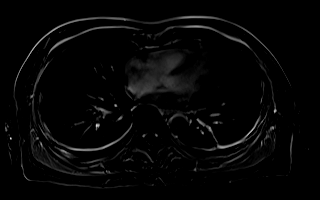

[44 of 48 positions shown; findings below may reference images not displayed]

FINDINGS: Lower chest: Small bilateral pleural effusions and associated
atelectasis or consolidation.

Hepatobiliary: There is an intrinsically T1 and T2 hyperintense
lesion of the inferior right lobe of the liver, hepatic segment VII,
corresponding to ultrasound-guided biopsy site and associated
minimal hemorrhage with Gel-Foam injection. The underlying lesion is
approximately 1.8 x 1.6 cm (series 16, image 63). There is an
additional contrast enhancing lesion of the lateral dome of the
liver measuring 1.6 x 1.5 cm, corresponding to a PET avid lesion
(series 16, image 22). No biliary ductal dilatation.

Pancreas: No mass, inflammatory changes, or other parenchymal
abnormality identified. No abnormal contrast enhancement. No
pancreatic ductal dilatation.

Spleen:  Within normal limits in size and appearance.

Adrenals/Urinary Tract: No masses identified. Simple cyst of the
right kidney. No evidence of hydronephrosis.

Stomach/Bowel: Visualized portions within the abdomen are
unremarkable.

Vascular/Lymphatic: No pathologically enlarged lymph nodes
identified. Aortic atherosclerosis. No abdominal aortic aneurysm
demonstrated.

Other:  None.

Musculoskeletal: No suspicious bone lesions identified.
IMPRESSION: 1. There is an intrinsically T1 and T2 hyperintense lesion of the
inferior right lobe of the liver, hepatic segment VII, corresponding
to a ultrasound-guided biopsy site and associated minimal hemorrhage
with Gel-Foam injection. The underlying lesion is approximately
x 1.6 cm and is in keeping with biopsy proven neuroendocrine
metastasis.
2. There is an additional contrast enhancing lesion of the lateral
dome of the liver measuring 1.6 x 1.5 cm, corresponding to a PET
avid lesion and consistent with an additional metastasis.
3. There is no mass or abnormal contrast enhancement of the
pancreatic tail to correspond to suspected PET avid lesion but the
possibility of an MR occult primary lesion of the pancreas is
nonetheless difficult to exclude.
4. No lymphadenopathy or other evidence of metastatic disease in the
abdomen.
5. Small bilateral pleural effusions and associated atelectasis or
consolidation.
6.  Aortic Atherosclerosis ([Z9]-[Z9]).

## 2020-05-16 MED ORDER — GADOBUTROL 1 MMOL/ML IV SOLN
7.0000 mL | Freq: Once | INTRAVENOUS | Status: AC | PRN
  Administered 2020-05-16: 7 mL via INTRAVENOUS

## 2020-05-16 NOTE — Telephone Encounter (Signed)
Called spoke with pt concerning results of MRI and upcoming appt did inquire how pt was feeling pt states he is feeling good no complaints pt encouraged to call for any changes concerns or questions pt did state if he thinks of any questions he will bring them with him to next appt unless they are emergent

## 2020-05-16 NOTE — Telephone Encounter (Signed)
-----   Message from Truitt Merle, MD sent at 05/16/2020  2:38 PM EDT ----- Please let him know his MRI results. The scan was ordered to see if his primary tumor is from pancrease, which is negative on MRI, so I think his primary tumor is from small bowel. No additional new info on MRI than what we have seen on previous PET scan. Ask if he is doing better after hospital discharge, and we plan to see him on 10/6, make sure he dose not have any other concerns needs to be addressed before her next visit. Thanks   Truitt Merle  05/16/2020

## 2020-05-17 ENCOUNTER — Encounter: Payer: Self-pay | Admitting: Nurse Practitioner

## 2020-05-18 ENCOUNTER — Telehealth: Payer: Self-pay

## 2020-05-18 NOTE — Telephone Encounter (Signed)
Pt/Daughter request for oxygen received this nurse called left message per provider please follow up with PCP Dr. Alroy Dust at [phone] this nurse also followed up with a fax to this office to make them aware of request to fax number [phone]

## 2020-05-21 ENCOUNTER — Other Ambulatory Visit: Payer: Self-pay

## 2020-05-21 ENCOUNTER — Inpatient Hospital Stay (HOSPITAL_BASED_OUTPATIENT_CLINIC_OR_DEPARTMENT_OTHER): Payer: Medicare HMO | Admitting: Medical

## 2020-05-21 ENCOUNTER — Inpatient Hospital Stay: Payer: Medicare HMO

## 2020-05-21 ENCOUNTER — Ambulatory Visit (HOSPITAL_COMMUNITY)
Admission: RE | Admit: 2020-05-21 | Discharge: 2020-05-21 | Disposition: A | Discharge status: Home / Self Care | Payer: Medicare HMO | Source: Ambulatory Visit | Attending: Medical | Admitting: Medical

## 2020-05-21 ENCOUNTER — Telehealth: Payer: Self-pay

## 2020-05-21 VITALS — BP 137/74 | HR 90 | Temp 99.0°F | Resp 17 | Ht 68.0 in | Wt 153.6 lb

## 2020-05-21 DIAGNOSIS — R0600 Dyspnea, unspecified: Secondary | ICD-10-CM | POA: Diagnosis not present

## 2020-05-21 DIAGNOSIS — I10 Essential (primary) hypertension: Secondary | ICD-10-CM | POA: Diagnosis not present

## 2020-05-21 DIAGNOSIS — J45909 Unspecified asthma, uncomplicated: Secondary | ICD-10-CM | POA: Diagnosis not present

## 2020-05-21 DIAGNOSIS — R062 Wheezing: Secondary | ICD-10-CM

## 2020-05-21 DIAGNOSIS — C7A00 Malignant carcinoid tumor of unspecified site: Secondary | ICD-10-CM | POA: Diagnosis not present

## 2020-05-21 DIAGNOSIS — J90 Pleural effusion, not elsewhere classified: Secondary | ICD-10-CM | POA: Diagnosis not present

## 2020-05-21 DIAGNOSIS — Z79899 Other long term (current) drug therapy: Secondary | ICD-10-CM | POA: Diagnosis not present

## 2020-05-21 DIAGNOSIS — R0602 Shortness of breath: Secondary | ICD-10-CM | POA: Diagnosis not present

## 2020-05-21 DIAGNOSIS — C7B8 Other secondary neuroendocrine tumors: Secondary | ICD-10-CM

## 2020-05-21 DIAGNOSIS — K6389 Other specified diseases of intestine: Secondary | ICD-10-CM

## 2020-05-21 DIAGNOSIS — Z800 Family history of malignant neoplasm of digestive organs: Secondary | ICD-10-CM | POA: Diagnosis not present

## 2020-05-21 DIAGNOSIS — Z87891 Personal history of nicotine dependence: Secondary | ICD-10-CM | POA: Diagnosis not present

## 2020-05-21 DIAGNOSIS — Z8546 Personal history of malignant neoplasm of prostate: Secondary | ICD-10-CM | POA: Diagnosis not present

## 2020-05-21 DIAGNOSIS — C7B02 Secondary carcinoid tumors of liver: Secondary | ICD-10-CM | POA: Diagnosis not present

## 2020-05-21 DIAGNOSIS — G250 Essential tremor: Secondary | ICD-10-CM | POA: Diagnosis not present

## 2020-05-21 LAB — CMP (CANCER CENTER ONLY)
ALT: 49 U/L — ABNORMAL HIGH (ref 0–44)
AST: 43 U/L — ABNORMAL HIGH (ref 15–41)
Albumin: 3.1 g/dL — ABNORMAL LOW (ref 3.5–5.0)
Alkaline Phosphatase: 99 U/L (ref 38–126)
Anion gap: 8 (ref 5–15)
BUN: 9 mg/dL (ref 8–23)
CO2: 29 mmol/L (ref 22–32)
Calcium: 9.4 mg/dL (ref 8.9–10.3)
Chloride: 99 mmol/L (ref 98–111)
Creatinine: 1.24 mg/dL (ref 0.61–1.24)
GFR, Est AFR Am: >60 mL/min (ref >60)
GFR, Estimated: 57 mL/min — ABNORMAL LOW (ref >60)
Glucose, Bld: 137 mg/dL — ABNORMAL HIGH (ref 70–99)
Potassium: 3.8 mmol/L (ref 3.5–5.1)
Sodium: 136 mmol/L (ref 135–145)
Total Bilirubin: 0.6 mg/dL (ref 0.3–1.2)
Total Protein: 7.9 g/dL (ref 6.5–8.1)

## 2020-05-21 LAB — CBC WITH DIFFERENTIAL (CANCER CENTER ONLY)
Abs Immature Granulocytes: 0.04 10*3/uL (ref 0.00–0.07)
Basophils Absolute: 0 10*3/uL (ref 0.0–0.1)
Basophils Relative: 0 %
Eosinophils Absolute: 0.2 10*3/uL (ref 0.0–0.5)
Eosinophils Relative: 2 %
HCT: 34.7 % — ABNORMAL LOW (ref 39.0–52.0)
Hemoglobin: 11.1 g/dL — ABNORMAL LOW (ref 13.0–17.0)
Immature Granulocytes: 1 %
Lymphocytes Relative: 19 %
Lymphs Abs: 1.4 10*3/uL (ref 0.7–4.0)
MCH: 29.1 pg (ref 26.0–34.0)
MCHC: 32 g/dL (ref 30.0–36.0)
MCV: 91.1 fL (ref 80.0–100.0)
Monocytes Absolute: 0.8 10*3/uL (ref 0.1–1.0)
Monocytes Relative: 10 %
Neutro Abs: 5.4 10*3/uL (ref 1.7–7.7)
Neutrophils Relative %: 68 %
Platelet Count: 386 10*3/uL (ref 150–400)
RBC: 3.81 MIL/uL — ABNORMAL LOW (ref 4.22–5.81)
RDW: 11.7 % (ref 11.5–15.5)
WBC Count: 7.8 10*3/uL (ref 4.0–10.5)
nRBC: 0 % (ref 0.0–0.2)

## 2020-05-21 LAB — D-DIMER, QUANTITATIVE: D-Dimer, Quant: 4.26 ug/mL-FEU — ABNORMAL HIGH (ref 0.00–0.50)

## 2020-05-21 IMAGING — CR DG CHEST 2V
2 series · 2 of 2 positions shown · non-contrast
Comparison: Chest CT [DATE]

CLINICAL DATA: Dyspnea.

EXAM:
CHEST - 2 VIEW

[w chest pa]
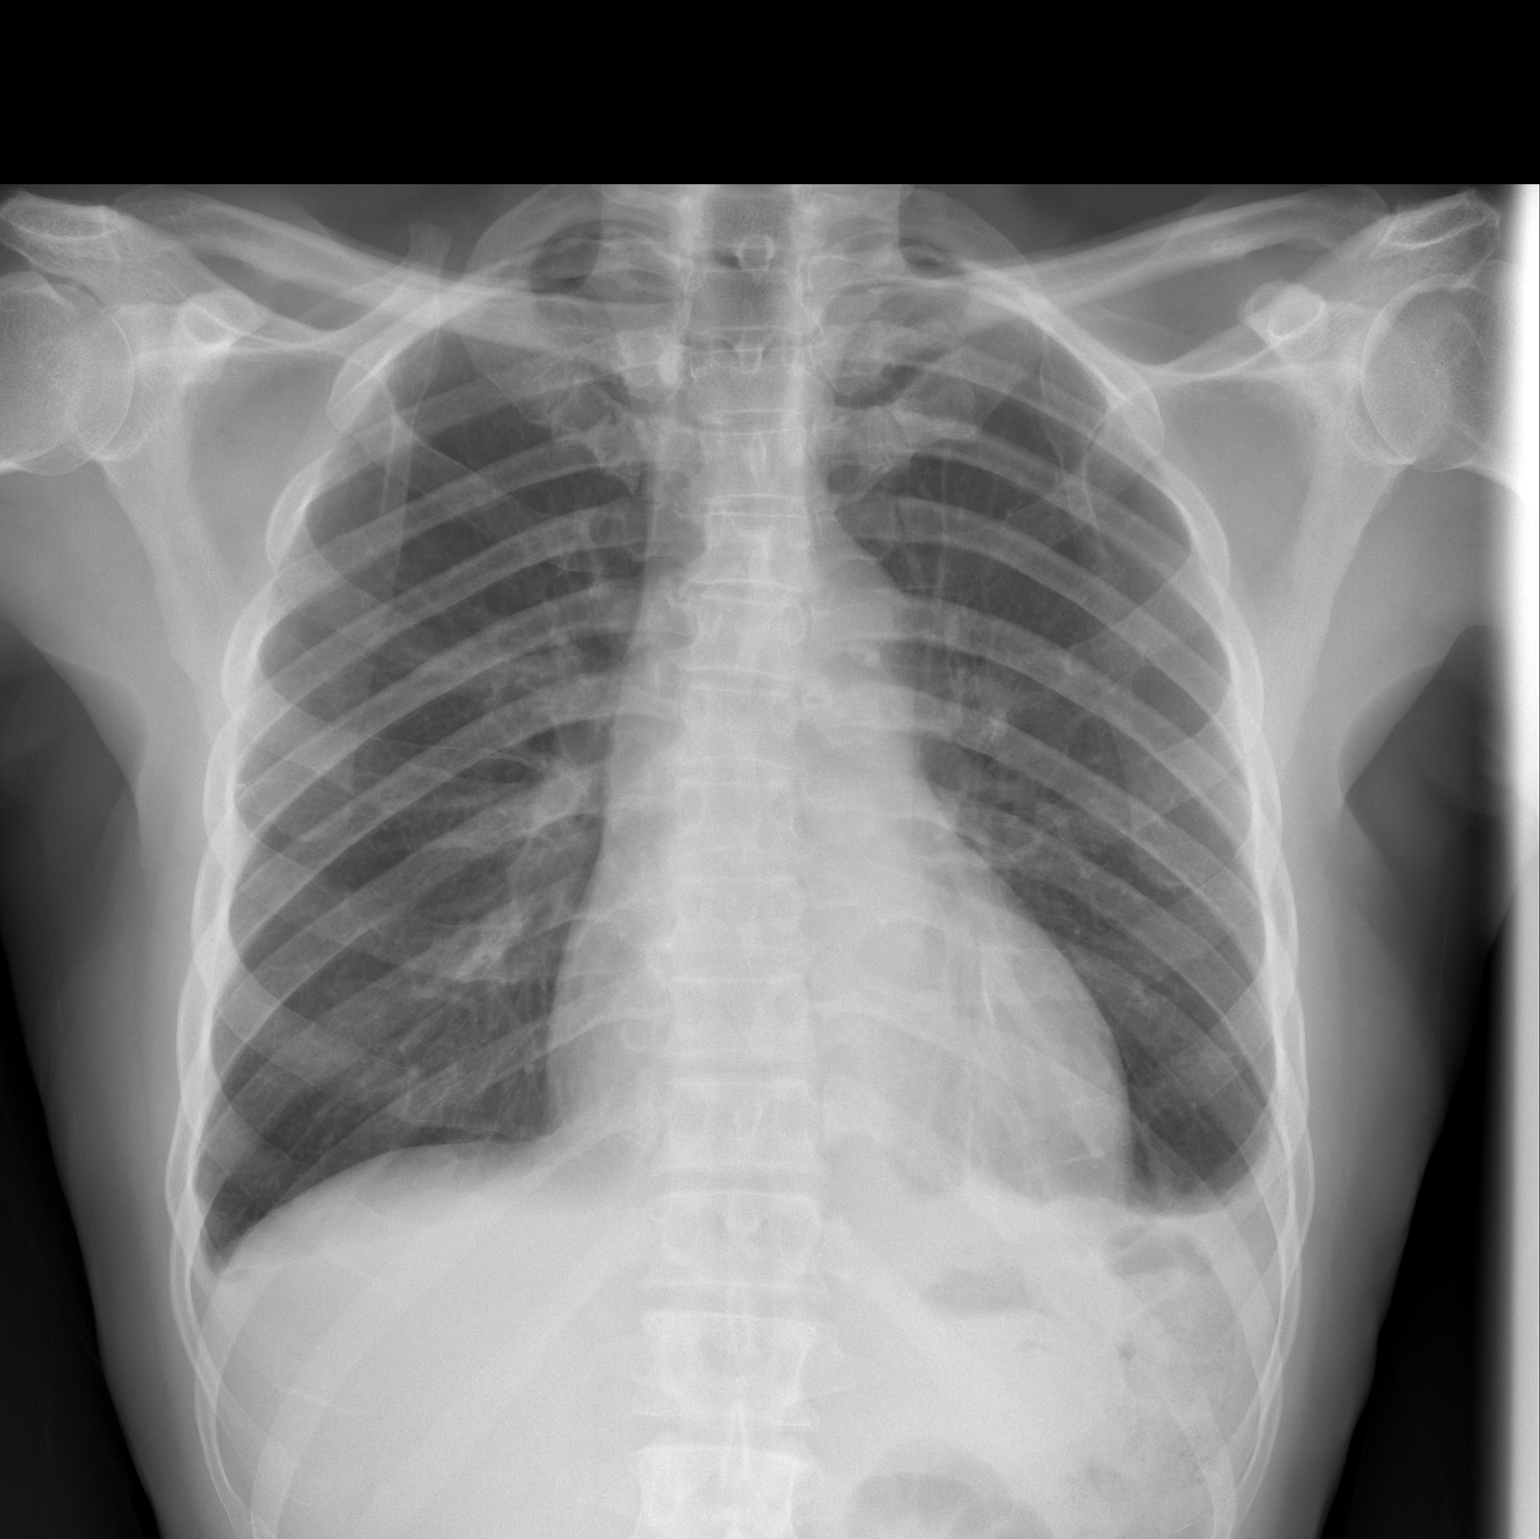

[w chest lat]
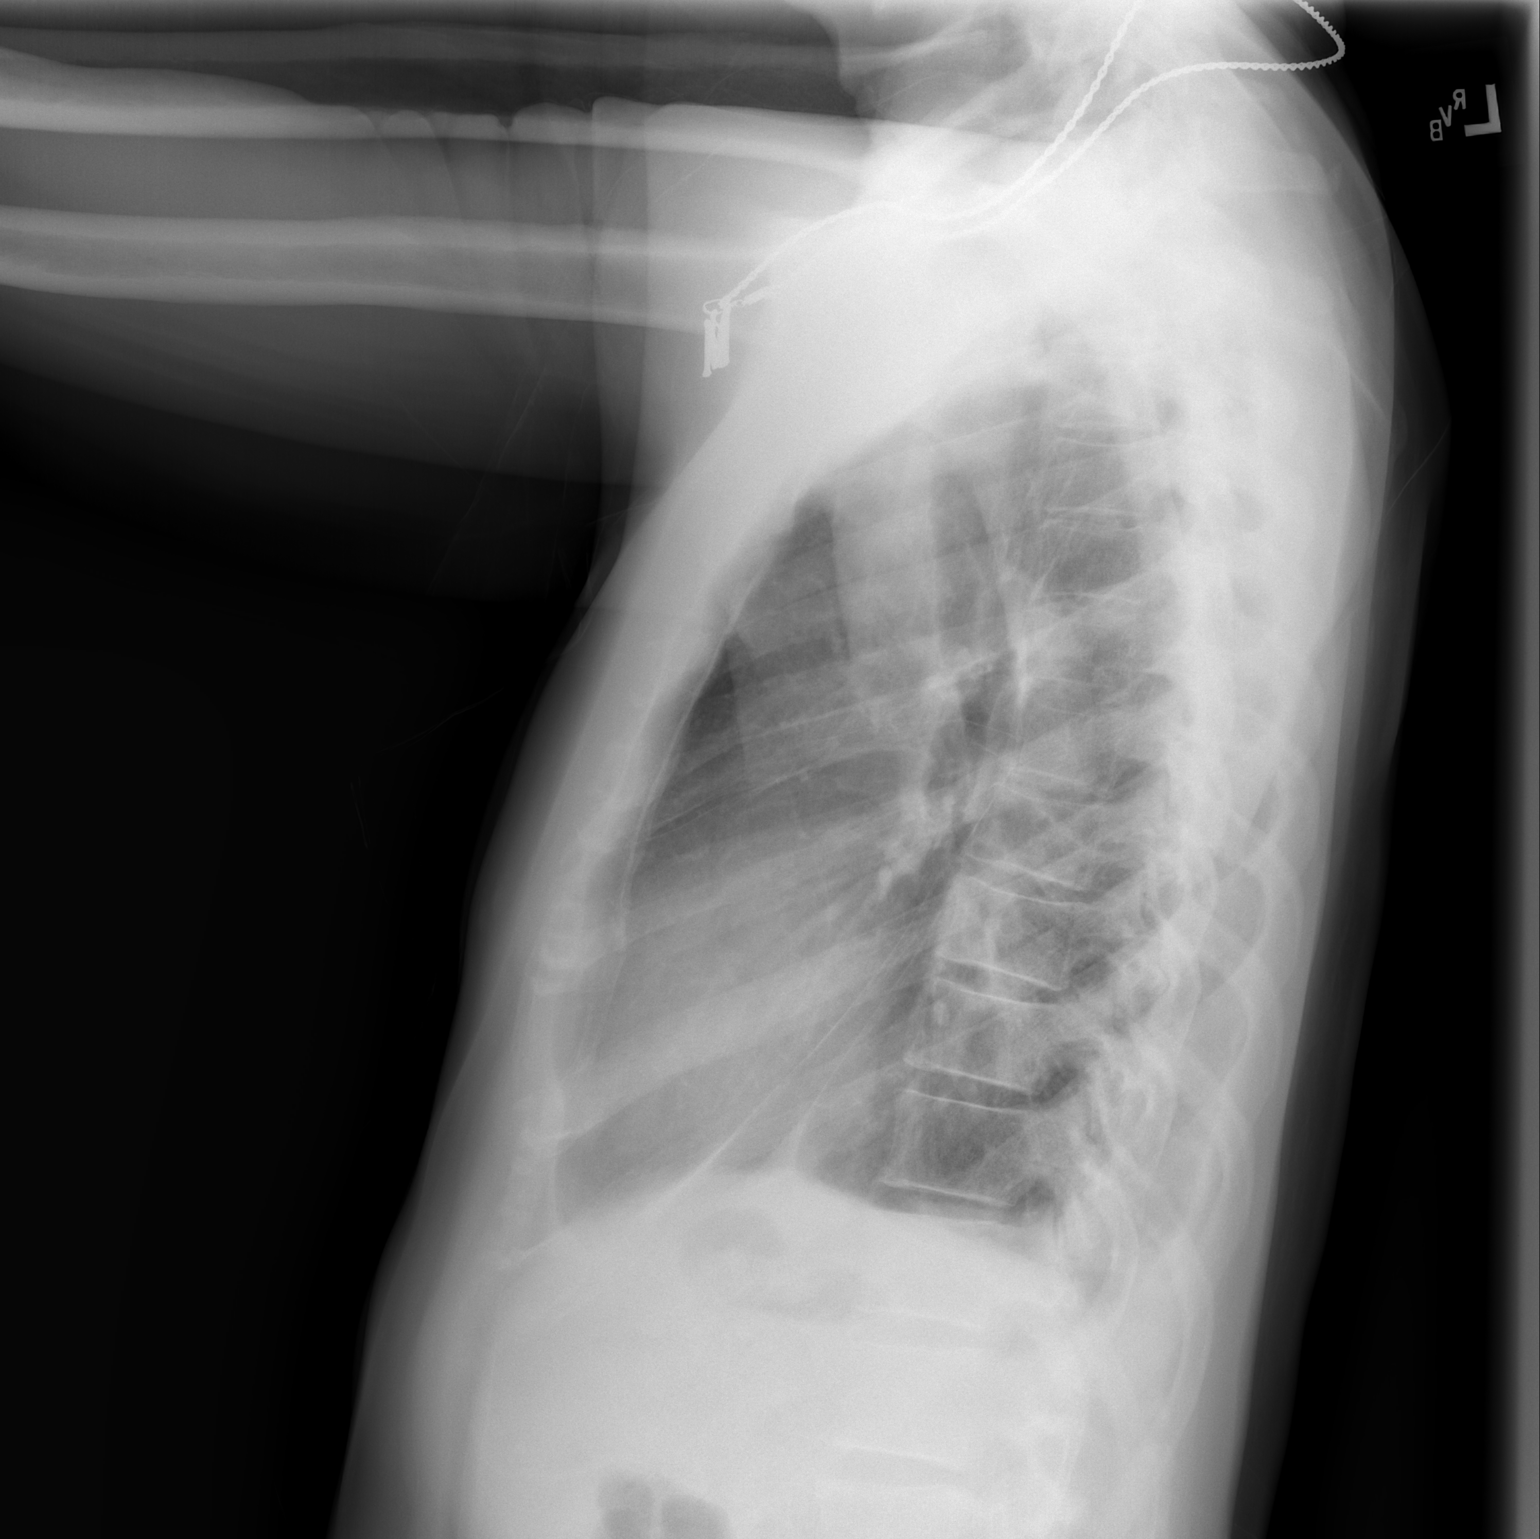

[2 of 2 positions shown; findings below may reference images not displayed]

FINDINGS: Blunting at the costophrenic angles, left side greater than right.
Findings are suggestive for small pleural effusions. Few densities
at the left lung base could represent atelectasis. Negative for
pulmonary edema. Negative for a pneumothorax. Heart size is within
normal limits. Trachea is midline.
IMPRESSION: Evidence for small pleural effusions, left side greater than right.

## 2020-05-21 MED ORDER — ALBUTEROL SULFATE HFA 108 (90 BASE) MCG/ACT IN AERS
1.0000 | INHALATION_SPRAY | RESPIRATORY_TRACT | 2 refills | Status: DC | PRN
Start: 2020-05-21 — End: 2020-06-08

## 2020-05-21 NOTE — Telephone Encounter (Signed)
Yes

## 2020-05-21 NOTE — Telephone Encounter (Signed)
Reviewed chart.  Pt has xray, lab and appointment with oncology PA at this time.  Was discharged 9/13 after cath, dx w pericarditis and sent home w 5 days worth of ibuprofen 400 mg BID and colchicine.  Will review w Dr. Angelena Form.   Follow up in [place] is 06/08/20.

## 2020-05-21 NOTE — Telephone Encounter (Signed)
Pt sent my chart message stating " I am experiencing labored breathing loss of appetite nausea difficulty sleeping please call " this writer calls pt denies all of the following chest head or neck pain, coughing, dizziness, wheezing, N/V/D or any GI upset pt denies activity intolerance and states he does not have a lot of trouble sleeping just some pt advised per Np to either come into [facility] or go to hospital ED for eval and treat pt denied both this nurse makes Dr.Fengs desk aware.

## 2020-05-22 DIAGNOSIS — R0602 Shortness of breath: Secondary | ICD-10-CM | POA: Diagnosis not present

## 2020-05-22 NOTE — Progress Notes (Signed)
These results were reviewed with the patient.

## 2020-05-23 LAB — CHROMOGRANIN A: Chromogranin A (ng/mL): 70.6 ng/mL (ref 0.0–101.8)

## 2020-05-23 NOTE — Progress Notes (Signed)
[facility] Progress Note   Ryan Wilkerson [id number] October 13, 1945 75 y.o.  [name] is managed by Dr. 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 Truitt Merle and is treated with octreotide with his last treatment completed on 05/10/2020.  He is scheduled to be seen in follow-up on 06/06/2020.  Wheezing and shortness of breath: The patient reports that he has a distant history of asthma.  He was given a prescription for an albuterol inhaler.  Please see After Visit Summary for patient specific instructions.  Future Appointments  Date Time Provider [place]  06/04/2020 10:00 AM [facility]- ECHO 1-RUTH [facility]  06/06/2020 11:15 AM Alla Feeling, NP [facility] None  06/06/2020 12:00 PM [facility] [place] FLUSH [facility] None  06/08/2020 10:45 AM Dunn, Nedra Hai, PA-C [facility]  07/05/2020 10:00 AM [facility] [place] FLUSH [facility] None    No orders of the defined types were placed in this encounter.      Subjective:   Patient ID:  Ryan Wilkerson is a 74 y.o. (DOB 1946/06/19) male.  Chief Complaint: No chief complaint on file.   HPI Ryan Wilkerson is a 74 y.o. male with a diagnosis of a metastatic malignant neuroendocrine tumor of the liver.  He continues to be followed by Dr. Truitt Merle and is treated with octreotide with his last treatment completed on 05/10/2020.  He was seen today with a report of wheezing and shortness of breath.  He has a history of asthma but reports that he has had no issues since he quit smoking in 1965.  He denies a cough, fevers, chills, or  sweats.  He denies any other issues of concern today.: The patient reports that he has a distant history of asthma.  He was given a prescription for an albuterol inhaler.     Medications: I have reviewed the patient's current medications.  Allergies:  Allergies  Allergen Reactions  . Aspirin Nausea And Vomiting and Other (See Comments)    "sharp stomach pain and excessive saliva"  . Simvastatin Other (See Comments)    "back and mid section achy pain"    Past Medical History:  Diagnosis Date  . Erectile dysfunction   . Essential tremor   . Family history of breast cancer   . Family history of prostate cancer   . Family history of stomach cancer   . GERD (gastroesophageal reflux disease)   . History of colon polyps   . Hypercholesterolemia   . Hypertension   . Nocturia   . Prostate cancer [facility]) urologist-- dr Tresa Moore /  oncologist-- dr manning   prostate bx 06-24-2016 and 09-15-2017 at dr Tresa Moore office  Stage T1c, Gleason 4+3, PSA 8.48, vol 33cc---  planned external beam radiation therpay  . Urgency of urination   . Wears glasses     Past Surgical History:  Procedure Laterality Date  . COLONOSCOPY  last one 06/ 2018  . GOLD SEED IMPLANT N/A 12/23/2017   Procedure: GOLD SEED IMPLANT;  Surgeon: Alexis Frock, MD;  Location: [facility];  Service: Urology;  Laterality: N/A;  . LEFT HEART CATH AND CORONARY ANGIOGRAPHY N/A 05/12/2020   Procedure: LEFT  HEART CATH AND CORONARY ANGIOGRAPHY;  Surgeon: Burnell Blanks, MD;  Location: [facility];  Service: Cardiovascular;  Laterality: N/A;  . PROSTATE BIOPSY  06-24-2016;  09-15-2017-- at dr Tresa Moore office  . SPACE OAR INSTILLATION N/A 12/23/2017   Procedure: SPACE OAR INSTILLATION;  Surgeon: Alexis Frock, MD;  Location: [facility];  Service: Urology;  Laterality: N/A;    Family History  Problem Relation Age of Onset  . Prostate cancer Brother 14  . Prostate cancer Brother         metastatic/ late treatment dx in 60's/70's  . Breast cancer Sister 32  . Prostate cancer Brother        dx in 60's/70's  . Prostate cancer Brother        dx 60's/70's  . Prostate cancer Brother        dx 60's/70's  . Prostate cancer Brother        27's  . Lung cancer Brother   . Prostate cancer Other   . Prostate cancer Other   . Lung cancer Sister     Social History   Socioeconomic History  . Marital status: Married    Spouse name: Not on file  . Number of children: 4  . Years of education: Not on file  . Highest education level: Not on file  Occupational History  . Occupation: Retired    Comment: Automotive engineer  Tobacco Use  . Smoking status: Former Smoker    Packs/day: 0.50    Years: 18.00    Pack years: 9.00    Types: Cigarettes    Quit date: 05/02/1974    Years since quitting: 46.0  . Smokeless tobacco: Never Used  Vaping Use  . Vaping Use: Never used  Substance and Sexual Activity  . Alcohol use: No  . Drug use: No  . Sexual activity: Yes  Other Topics Concern  . Not on file  Social History Narrative   Married with 3 daughters, 1 son. 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   Social Determinants of Health   Financial Resource Strain:   . Difficulty of Paying Living Expenses: Not on file  Food Insecurity:   . Worried About Charity fundraiser in the Last Year: Not on file  . Ran Out of Food in the Last Year: Not on file  Transportation Needs:   . Lack of Transportation (Medical): Not on file  . Lack of Transportation (Non-Medical): Not on file  Physical Activity:   . Days of Exercise per Week: Not on file  . Minutes of Exercise per Session: Not on file  Stress:   . Feeling of Stress : Not on file  Social Connections:   . Frequency of Communication with Friends and Family: Not on file  . Frequency of Social Gatherings with Friends and Family: Not on file  .  Attends Religious Services: Not on file  . Active Member of Clubs or Organizations: Not on file  . Attends Archivist Meetings: Not on file  . Marital Status: Not on file  Intimate Partner Violence:   . Fear of Current or Ex-Partner: Not on file  . Emotionally Abused: Not on file  . Physically Abused: Not on file  . Sexually Abused: Not on file    Past Medical History, Surgical history, Social history, and Family history were reviewed and updated as appropriate.   Please  see review of systems for further details on the patient's review from today.   Review of Systems:  Review of Systems  Constitutional: Negative for chills, diaphoresis, fatigue and fever.  HENT: Negative for congestion, postnasal drip, rhinorrhea, sore throat and trouble swallowing.   Respiratory: Positive for shortness of breath and wheezing. Negative for cough.   Cardiovascular: Negative for palpitations.  Neurological: Negative for headaches.    Objective:   Physical Exam:  BP 137/74 (BP Location: Left Arm, Patient Position: Sitting) Comment: Danae Orleans LPN was aware.  Pulse 90   Temp 99 F (37.2 C) (Tympanic)   Resp 17   Ht 5\' 8"  (1.727 m)   Wt 153 lb 9.6 oz (69.7 kg)   SpO2 99%   BMI 23.35 kg/m  ECOG: 0  Physical Exam Constitutional:      General: He is not in acute distress.    Appearance: He is not diaphoretic.  HENT:     Head: Normocephalic and atraumatic.  Eyes:     General: No scleral icterus.       Right eye: No discharge.        Left eye: No discharge.     Conjunctiva/sclera: Conjunctivae normal.  Cardiovascular:     Rate and Rhythm: Normal rate and regular rhythm.     Heart sounds: Normal heart sounds. No murmur heard.  No friction rub. No gallop.   Pulmonary:     Effort: Pulmonary effort is normal. No respiratory distress.     Breath sounds: Normal breath sounds. No wheezing or rales.  Skin:    General: Skin is warm and dry.     Findings: No erythema or rash.   Neurological:     Mental Status: He is alert.     Coordination: Coordination normal.     Gait: Gait normal.  Psychiatric:        Mood and Affect: Mood normal.        Behavior: Behavior normal.        Thought Content: Thought content normal.        Judgment: Judgment normal.     Lab Review:     Component Value Date/Time   NA 136 05/21/2020 1423   K 3.8 05/21/2020 1423   CL 99 05/21/2020 1423   CO2 29 05/21/2020 1423   GLUCOSE 137 (H) 05/21/2020 1423   BUN 9 05/21/2020 1423   CREATININE 1.24 05/21/2020 1423   CALCIUM 9.4 05/21/2020 1423   PROT 7.9 05/21/2020 1423   ALBUMIN 3.1 (L) 05/21/2020 1423   AST 43 (H) 05/21/2020 1423   ALT 49 (H) 05/21/2020 1423   ALKPHOS 99 05/21/2020 1423   BILITOT 0.6 05/21/2020 1423   GFRNONAA 57 (L) 05/21/2020 1423   GFRAA >60 05/21/2020 1423       Component Value Date/Time   WBC 7.8 05/21/2020 1423   WBC 12.2 (H) 05/13/2020 0812   RBC 3.81 (L) 05/21/2020 1423   HGB 11.1 (L) 05/21/2020 1423   HCT 34.7 (L) 05/21/2020 1423   PLT 386 05/21/2020 1423   MCV 91.1 05/21/2020 1423   MCH 29.1 05/21/2020 1423   MCHC 32.0 05/21/2020 1423   RDW 11.7 05/21/2020 1423   LYMPHSABS 1.4 05/21/2020 1423   MONOABS 0.8 05/21/2020 1423   EOSABS 0.2 05/21/2020 1423   BASOSABS 0.0 05/21/2020 1423   -------------------------------  Imaging from last 24 hours (if applicable):  Radiology interpretation: DG Chest 2 View  Result Date: 05/21/2020 CLINICAL DATA:  Dyspnea. EXAM: CHEST - 2  VIEW COMPARISON:  Chest CT 05/12/2020 FINDINGS: Blunting at the costophrenic angles, left side greater than right. Findings are suggestive for small pleural effusions. Few densities at the left lung base could represent atelectasis. Negative for pulmonary edema. Negative for a pneumothorax. Heart size is within normal limits. Trachea is midline. IMPRESSION: Evidence for small pleural effusions, left side greater than right. Electronically Signed   By: Markus Daft M.D.   On:  05/21/2020 15:17   MR Abdomen W Wo Contrast  Result Date: 05/16/2020 CLINICAL DATA:  Biopsy proven metastatic neuroendocrine tumor of the liver, PET avid pancreatic tail lesion on prior PET-CT, rule out primary pancreatic neuroendocrine tumor EXAM: MRI ABDOMEN WITHOUT AND WITH CONTRAST TECHNIQUE: Multiplanar multisequence MR imaging of the abdomen was performed both before and after the administration of intravenous contrast. CONTRAST:  30mL GADAVIST GADOBUTROL 1 MMOL/ML IV SOLN COMPARISON:  PET-CT, 04/09/2020, ultrasound-guided biopsy of the liver, 04/30/2020 FINDINGS: Lower chest: Small bilateral pleural effusions and associated atelectasis or consolidation. Hepatobiliary: There is an intrinsically T1 and T2 hyperintense lesion of the inferior right lobe of the liver, hepatic segment VII, corresponding to ultrasound-guided biopsy site and associated minimal hemorrhage with Gel-Foam injection. The underlying lesion is approximately 1.8 x 1.6 cm (series 16, image 63). There is an additional contrast enhancing lesion of the lateral dome of the liver measuring 1.6 x 1.5 cm, corresponding to a PET avid lesion (series 16, image 22). No biliary ductal dilatation. Pancreas: No mass, inflammatory changes, or other parenchymal abnormality identified. No abnormal contrast enhancement. No pancreatic ductal dilatation. Spleen:  Within normal limits in size and appearance. Adrenals/Urinary Tract: No masses identified. Simple cyst of the right kidney. No evidence of hydronephrosis. Stomach/Bowel: Visualized portions within the abdomen are unremarkable. Vascular/Lymphatic: No pathologically enlarged lymph nodes identified. Aortic atherosclerosis. No abdominal aortic aneurysm demonstrated. Other:  None. Musculoskeletal: No suspicious bone lesions identified. IMPRESSION: 1. There is an intrinsically T1 and T2 hyperintense lesion of the inferior right lobe of the liver, hepatic segment VII, corresponding to a ultrasound-guided  biopsy site and associated minimal hemorrhage with Gel-Foam injection. The underlying lesion is approximately 1.8 x 1.6 cm and is in keeping with biopsy proven neuroendocrine metastasis. 2. There is an additional contrast enhancing lesion of the lateral dome of the liver measuring 1.6 x 1.5 cm, corresponding to a PET avid lesion and consistent with an additional metastasis. 3. There is no mass or abnormal contrast enhancement of the pancreatic tail to correspond to suspected PET avid lesion but the possibility of an MR occult primary lesion of the pancreas is nonetheless difficult to exclude. 4. No lymphadenopathy or other evidence of metastatic disease in the abdomen. 5. Small bilateral pleural effusions and associated atelectasis or consolidation. 6.  Aortic Atherosclerosis (ICD10-I70.0). Electronically Signed   By: Eddie Candle M.D.   On: 05/16/2020 11:37   CARDIAC CATHETERIZATION  Result Date: 05/12/2020 No angiographic evidence of CAD Recommendations: Pt with ongoing chest pain but normal coronary arteries. No aortic dissection on chest CTA. Suspect pericarditis. Will admit to the ICU and attempt pain control with narcotics. Echo in the am. Will start colchicine.   MR 3D Recon At Scanner  Result Date: 05/16/2020 CLINICAL DATA:  Biopsy proven metastatic neuroendocrine tumor of the liver, PET avid pancreatic tail lesion on prior PET-CT, rule out primary pancreatic neuroendocrine tumor EXAM: MRI ABDOMEN WITHOUT AND WITH CONTRAST TECHNIQUE: Multiplanar multisequence MR imaging of the abdomen was performed both before and after the administration of intravenous contrast. CONTRAST:  48mL  GADAVIST GADOBUTROL 1 MMOL/ML IV SOLN COMPARISON:  PET-CT, 04/09/2020, ultrasound-guided biopsy of the liver, 04/30/2020 FINDINGS: Lower chest: Small bilateral pleural effusions and associated atelectasis or consolidation. Hepatobiliary: There is an intrinsically T1 and T2 hyperintense lesion of the inferior right lobe of the  liver, hepatic segment VII, corresponding to ultrasound-guided biopsy site and associated minimal hemorrhage with Gel-Foam injection. The underlying lesion is approximately 1.8 x 1.6 cm (series 16, image 63). There is an additional contrast enhancing lesion of the lateral dome of the liver measuring 1.6 x 1.5 cm, corresponding to a PET avid lesion (series 16, image 22). No biliary ductal dilatation. Pancreas: No mass, inflammatory changes, or other parenchymal abnormality identified. No abnormal contrast enhancement. No pancreatic ductal dilatation. Spleen:  Within normal limits in size and appearance. Adrenals/Urinary Tract: No masses identified. Simple cyst of the right kidney. No evidence of hydronephrosis. Stomach/Bowel: Visualized portions within the abdomen are unremarkable. Vascular/Lymphatic: No pathologically enlarged lymph nodes identified. Aortic atherosclerosis. No abdominal aortic aneurysm demonstrated. Other:  None. Musculoskeletal: No suspicious bone lesions identified. IMPRESSION: 1. There is an intrinsically T1 and T2 hyperintense lesion of the inferior right lobe of the liver, hepatic segment VII, corresponding to a ultrasound-guided biopsy site and associated minimal hemorrhage with Gel-Foam injection. The underlying lesion is approximately 1.8 x 1.6 cm and is in keeping with biopsy proven neuroendocrine metastasis. 2. There is an additional contrast enhancing lesion of the lateral dome of the liver measuring 1.6 x 1.5 cm, corresponding to a PET avid lesion and consistent with an additional metastasis. 3. There is no mass or abnormal contrast enhancement of the pancreatic tail to correspond to suspected PET avid lesion but the possibility of an MR occult primary lesion of the pancreas is nonetheless difficult to exclude. 4. No lymphadenopathy or other evidence of metastatic disease in the abdomen. 5. Small bilateral pleural effusions and associated atelectasis or consolidation. 6.  Aortic  Atherosclerosis (ICD10-I70.0). Electronically Signed   By: Eddie Candle M.D.   On: 05/16/2020 11:37   US BIOPSY (LIVER)  Result Date: 04/30/2020 INDICATION: Concern for metastatic neuroendocrine cancer. Please perform ultrasound-guided liver lesion biopsy for tissue diagnostic purposes. EXAM: ULTRASOUND GUIDED LIVER LESION BIOPSY COMPARISON:  PET-CT-04/09/2020; CT abdomen and pelvis-03/02/2019 MEDICATIONS: None ANESTHESIA/SEDATION: Fentanyl 100 mcg IV; Versed 2 mg IV Total Moderate Sedation time:  14 Minutes. The patient's level of consciousness and vital signs were monitored continuously by radiology nursing throughout the procedure under my direct supervision. COMPLICATIONS: None immediate. PROCEDURE: Informed written consent was obtained from the patient after a discussion of the risks, benefits and alternatives to treatment. The patient understands and consents the procedure. A timeout was performed prior to the initiation of the procedure. Ultrasound scanning was performed of the right upper abdominal quadrant demonstrates an approximately 2.1 x 1.8 cm hyperechoic lesion within the caudal aspect the right lobe of the liver (image 8), correlating with the hypermetabolic lesion seen on preceding PET-CT image 111, series 4. The procedure was planned. The right upper abdominal quadrant was prepped and draped in the usual sterile fashion. The overlying soft tissues were anesthetized with 1% lidocaine with epinephrine. A 17 gauge, 6.8 cm co-axial needle was advanced into a peripheral aspect of the lesion. This was followed by 5 core biopsies with an 18 gauge core device under direct ultrasound guidance. The coaxial needle tract was embolized with a small amount of Gel-Foam slurry and superficial hemostasis was obtained with manual compression. Post procedural scanning was negative for definitive area of hemorrhage  or additional complication. A dressing was placed. The patient tolerated the procedure well without  immediate post procedural complication. IMPRESSION: Technically successful ultrasound guided core needle biopsy of indeterminate 2.1 cm lesion within the caudal aspect of the right lobe of the liver. Electronically Signed   By: Sandi Mariscal M.D.   On: 04/30/2020 13:56   CT ANGIO CHEST AORTA W/CM & OR WO/CM  Result Date: 05/12/2020 CLINICAL DATA:  Chest pain all day, worse with inspiration. EXAM: CT ANGIOGRAPHY CHEST WITH CONTRAST TECHNIQUE: Multidetector CT imaging of the chest was performed using the standard protocol during bolus administration of intravenous contrast. Multiplanar CT image reconstructions and MIPs were obtained to evaluate the vascular anatomy. CONTRAST:  183mL OMNIPAQUE IOHEXOL 350 MG/ML SOLN COMPARISON:  PET CT 04/09/2020 FINDINGS: Cardiovascular: Mild cardiac enlargement. Moderate pericardial effusion. Normal caliber thoracic aorta. Maximal diameter of the ascending thoracic aorta is 3.5 cm. No evidence of aortic dissection. No mural thrombus or wall thickening. Great vessel origins are patent. Pulmonary arteries are moderately well opacified without evidence of significant pulmonary embolus. Mediastinum/Nodes: Esophagus is decompressed. No significant lymphadenopathy in the chest. Scattered calcified lymph nodes consistent with postinflammatory change. Lungs/Pleura: Atelectasis or edema in the lung bases. No pleural effusions. No pneumothorax. Airways are patent. 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 Upper Abdomen: No acute process demonstrated in the visualized upper abdomen. Musculoskeletal: No destructive bone lesions. Review of the MIP images confirms the above findings. IMPRESSION: 1. No evidence of significant pulmonary embolus. 2. Mild cardiac enlargement with moderate pericardial effusion. 3. Atelectasis or edema in the lung bases. 4. 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 5. Aortic atherosclerosis. Aortic Atherosclerosis  (ICD10-I70.0). Results were discussed at the workstation with Dr. Marletta Lor in cardiology at the time of dictation. Electronically Signed   By: Lucienne Capers M.D.   On: 05/12/2020 02:01   ECHOCARDIOGRAM COMPLETE  Result Date: 05/12/2020    ECHOCARDIOGRAM REPORT   Patient Name:   Ryan Wilkerson Date of Exam: 05/12/2020 Medical Rec #:  [id number]        Height:       68.0 in Accession #:    [id number]       Weight:       157.0 lb Date of Birth:  February 17, 1946       BSA:          1.844 m Patient Age:    27 years         BP:           95/78 mmHg Patient Gender: M                HR:           93 bpm. Exam Location:  Inpatient Procedure: 2D Echo Indications:    pericardial effusion423.9  History:        Patient has no prior history of Echocardiogram examinations.                 Signs/Symptoms:Chest Pain.  Sonographer:    Johny Chess Referring Phys: [place]  1. Small pericardial effusion no evidence of tamponade.  2. Abnormal septal motion . Left ventricular ejection fraction, by estimation, is 50 to 55%. The left ventricle has low normal function. The left ventricle has no regional wall motion abnormalities. Left ventricular diastolic parameters were normal.  3. Right ventricular systolic function is normal. The right ventricular size is normal. There is normal pulmonary artery systolic pressure.  4. A  small pericardial effusion is present.  5. The mitral valve is normal in structure. No evidence of mitral valve regurgitation. No evidence of mitral stenosis.  6. Tricuspid valve regurgitation is mild to moderate.  7. The aortic valve is tricuspid. Aortic valve regurgitation is not visualized. Mild aortic valve sclerosis is present, with no evidence of aortic valve stenosis.  8. The inferior vena cava is dilated in size with >50% respiratory variability, suggesting right atrial pressure of 8 mmHg. FINDINGS  Left Ventricle: Abnormal septal motion. Left ventricular ejection fraction, by  estimation, is 50 to 55%. The left ventricle has low normal function. The left ventricle has no regional wall motion abnormalities. The left ventricular internal cavity size was normal in size. There is no left ventricular hypertrophy. Left ventricular diastolic parameters were normal. Right Ventricle: The right ventricular size is normal. No increase in right ventricular wall thickness. Right ventricular systolic function is normal. There is normal pulmonary artery systolic pressure. The tricuspid regurgitant velocity is 2.44 m/s, and  with an assumed right atrial pressure of 8 mmHg, the estimated right ventricular systolic pressure is 50.9 mmHg. Left Atrium: Left atrial size was normal in size. Right Atrium: Right atrial size was normal in size. Pericardium: A small pericardial effusion is present. Mitral Valve: The mitral valve is normal in structure. No evidence of mitral valve regurgitation. No evidence of mitral valve stenosis. Tricuspid Valve: The tricuspid valve is normal in structure. Tricuspid valve regurgitation is mild to moderate. No evidence of tricuspid stenosis. Aortic Valve: The aortic valve is tricuspid. Aortic valve regurgitation is not visualized. Mild aortic valve sclerosis is present, with no evidence of aortic valve stenosis. Pulmonic Valve: The pulmonic valve was normal in structure. Pulmonic valve regurgitation is trivial. No evidence of pulmonic stenosis. Aorta: The aortic root is normal in size and structure. Venous: The inferior vena cava is dilated in size with greater than 50% respiratory variability, suggesting right atrial pressure of 8 mmHg. IAS/Shunts: No atrial level shunt detected by color flow Doppler. Additional Comments: Small pericardial effusion no evidence of tamponade.  LEFT VENTRICLE PLAX 2D LVIDd:         3.40 cm  Diastology LVIDs:         2.50 cm  LV e' medial:    7.72 cm/s LV PW:         1.00 cm  LV E/e' medial:  8.3 LV IVS:        0.90 cm  LV e' lateral:   7.94 cm/s  LVOT diam:     2.00 cm  LV E/e' lateral: 8.0 LV SV:         55 LV SV Index:   30 LVOT Area:     3.14 cm  RIGHT VENTRICLE             IVC RV S prime:     16.20 cm/s  IVC diam: 2.30 cm TAPSE (M-mode): 2.6 cm LEFT ATRIUM         Index      RIGHT ATRIUM           Index LA diam:    2.70 cm 1.46 cm/m RA Area:     14.50 cm                                RA Volume:   37.10 ml  20.12 ml/m  AORTIC VALVE LVOT Vmax:   102.00 cm/s LVOT Vmean:  66.200 cm/s LVOT VTI:    0.174 m  AORTA Ao Root diam: 3.00 cm Ao Asc diam:  3.00 cm MITRAL VALVE               TRICUSPID VALVE MV Area (PHT): 3.85 cm    TR Peak grad:   23.8 mmHg MV Decel Time: 197 msec    TR Vmax:        244.00 cm/s MV E velocity: 63.80 cm/s MV A velocity: 54.40 cm/s  SHUNTS MV E/A ratio:  1.17        Systemic VTI:  0.17 m                            Systemic Diam: 2.00 cm Jenkins Rouge MD Electronically signed by Jenkins Rouge MD Signature Date/Time: 05/12/2020/10:08:20 AM    Final

## 2020-06-04 ENCOUNTER — Ambulatory Visit (HOSPITAL_COMMUNITY)
Admission: RE | Admit: 2020-06-04 | Discharge: 2020-06-04 | Disposition: A | Discharge status: Home / Self Care | Payer: Medicare HMO | Source: Ambulatory Visit | Attending: Nurse Practitioner | Admitting: Nurse Practitioner

## 2020-06-04 ENCOUNTER — Other Ambulatory Visit: Payer: Self-pay

## 2020-06-04 DIAGNOSIS — C7B8 Other secondary neuroendocrine tumors: Secondary | ICD-10-CM | POA: Diagnosis not present

## 2020-06-04 DIAGNOSIS — Z0189 Encounter for other specified special examinations: Secondary | ICD-10-CM

## 2020-06-04 DIAGNOSIS — I319 Disease of pericardium, unspecified: Secondary | ICD-10-CM | POA: Diagnosis not present

## 2020-06-04 DIAGNOSIS — I313 Pericardial effusion (noninflammatory): Secondary | ICD-10-CM | POA: Diagnosis not present

## 2020-06-04 LAB — ECHOCARDIOGRAM COMPLETE
Area-P 1/2: 3.65 cm2
Calc EF: 59.2 %
S' Lateral: 3.1 cm
Single Plane A2C EF: 61.6 %
Single Plane A4C EF: 57.9 %

## 2020-06-04 NOTE — Progress Notes (Signed)
  Echocardiogram 2D Echocardiogram has been performed.  Ryan Wilkerson 06/04/2020, 10:52 AM

## 2020-06-05 NOTE — Progress Notes (Addendum)
Ryan Wilkerson   [phone] [phone]   Clinic Follow up Note   Patient Care Team: Alroy Dust, L.Marlou Sa, MD as PCP - General (Family Medicine) Burnell Blanks, MD as PCP - Cardiology (Cardiology) Jonnie Finner, RN as Oncology Nurse Navigator Truitt Merle, MD as Consulting Physician (Oncology) Alla Feeling, NP as Nurse Practitioner (Nurse Practitioner) Alexis Frock, MD as Consulting Physician (Urology) Tyler Pita, MD as Consulting Physician (Radiation Oncology) 06/06/2020  CHIEF COMPLAINT: Follow-up metastatic neuroendocrine tumor  SUMMARY OF ONCOLOGIC HISTORY: Oncology History  Malignant neoplasm of prostate ([place])  08/31/2017 Initial Diagnosis   Malignant neoplasm of prostate ([place])   10/28/2017 Genetic Testing   The patient had genetic testing due to a personal history of prostate cancer and a family history of breast and stomach cancer.  The Common Hereditary Cancers Panel + Prostate Cancer Panel was ordered.  APC, ATM, AXIN2, BARD1, BMPR1A, BRCA1, BRCA2, BRIP1, CDH1, CDK4, CDKN2A (p14ARF), CDKN2A (p16INK4a), CHEK2, CTNNA1, DICER1, EPCAM*, FANCA, GREM1*, KIT, MEN1, MLH1, MSH2, MSH3, MSH6, MUTYH, NBN, NF1, PALB2, PDGFRA, PMS2, POLD1, POLE, PTEN, RAD50, RAD51C, RAD51D, SDHB, SDHC, SDHD, SMAD4, SMARCA4, STK11, TP53, TSC1, TSC2, VHL. The following genes were evaluated for sequence changes only: HOXB13*, NTHL1*, SDHA.   Results: Negative, no pathogenic variants identified. The date of this test report is 10/28/2017.    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 2.5 x 2.0 x 3.0 cm soft tissue lesion in the central small bowel mesentery with associated dystrophic calcification and retraction of adjacent small bowel loops features highly suspicious for metastatic carcinoid tumor.  Upper normal 9 mm short axis lymph node in the adjacent mesentery 2. 2.  1.6 x 1.4 cm heterogeneous, poorly defined  lesion in the inferior right liver suspicious for metastatic disease, the differential includes GI primary or metastatic prostate cancer 3. No retroperitoneal or pelvic sidewall lymphadenopathy   04/01/2020 PET scan   IMPRESSION: 1. Central mesenteric mass with intense radiotracer activity consistent well differentiated neuroendocrine tumor 2. 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 3. Small peritoneal/nodal metastasis along the LEFT iliac vessels. 4. Very small lesion in the pancreas with intermediate activity could represent primary lesion. No primary bowel lesion identified. 5. Small metastatic implant within the pericardium adjacent LEFT heart ventricle. 6. No skeletal metastasis.   04/30/2020 Pathology Results   FINAL MICROSCOPIC DIAGNOSIS:  A. 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  Tumor cells are negative  for TTF-1.  The findings are consistent with neuroendocrine tumor of  gastrointestinal origin.  Ki-67 stain shows a proliferative index of  about 1%, consistent with well-differentiated, grade 1 tumor.    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  He was last seen by me on 05/03/2020, began octreotide on 05/10/2020.  He developed acute chest pain, nausea, diaphoresis hoarseness, and presented to the ED on 05/12/2020, found to have inferior STE. echo showed preserved LV function and small pericardial effusion, CTA did not show aortic dissection.  He was taken for urgent cath which was normal.  He was treated for suspected acute pericarditis with colchicine 0.6 p.o. twice daily x3  months and ibuprofen.  He remained stable and was discharged home on 05/14/2020.  He underwent outpatient MRI to determine the primary site of his metastatic neuroendocrine tumor which is felt to be small bowel.  He notified our clinic of shortness of breath and wheezing, seen in [facility] on 9/20, treated for asthma exacerbation.  He was recently taken off albuterol and started on Symbicort, no difference.  Cough produces mild "normal colored phlegm."  Denies fever or chills.  He continues to have hoarseness and shortness of breath with long periods of talking.  He has not had recurrent chest pain episodes, but feels weak in general with loss of appetite and over 10 pounds weight loss since his first injection.  He can go on short walks and do ADLs but nowhere near the activity level he was before beginning injections or his hospitalization.  He stopped Keppra on his own due to concern that polypharmacy was contributing to his overall symptoms. Also no longer on colchicine.  He has cycles of postprandial epigastric pain that changes to right upper quadrant pain that resolves after bowel movement.  He requires tramadol occasionally.  Bowels are loose after using recent enema, otherwise he is constipated.  Stool color is light brown.  Urine is not dark.  He is apprehensive about proceeding with octreotide.    MEDICAL HISTORY:  Past Medical History:  Diagnosis Date  . Erectile dysfunction   . Essential tremor   . Family history of breast cancer   . Family history of prostate cancer   . Family history of stomach cancer   . GERD (gastroesophageal reflux disease)   . History of colon polyps   . Hypercholesterolemia   . Hypertension   . Nocturia   . Prostate cancer [facility]) urologist-- dr Tresa Moore /  oncologist-- dr manning   prostate bx 06-24-2016 and 09-15-2017 at dr Tresa Moore office  Stage T1c, Gleason 4+3, PSA 8.48, vol 33cc---  planned external beam radiation therpay  . Urgency of urination   . Wears glasses      SURGICAL HISTORY: Past Surgical History:  Procedure Laterality Date  . COLONOSCOPY  last one 06/ 2018  . GOLD SEED IMPLANT N/A 12/23/2017   Procedure: GOLD SEED IMPLANT;  Surgeon: Alexis Frock, MD;  Location: [facility];  Service: Urology;  Laterality: N/A;  . LEFT HEART CATH AND CORONARY ANGIOGRAPHY N/A 05/12/2020   Procedure: LEFT HEART CATH AND CORONARY ANGIOGRAPHY;  Surgeon: Burnell Blanks, MD;  Location: [facility];  Service: Cardiovascular;  Laterality: N/A;  . PROSTATE BIOPSY  06-24-2016;  09-15-2017-- at dr Tresa Moore office  . SPACE OAR INSTILLATION N/A 12/23/2017   Procedure: SPACE OAR INSTILLATION;  Surgeon: Alexis Frock, MD;  Location: [facility];  Service: Urology;  Laterality: N/A;    I have reviewed the social history and family history with the patient and they are unchanged from previous note.  ALLERGIES:  is allergic to aspirin and simvastatin.  MEDICATIONS:  Current Outpatient Medications  Medication Sig Dispense Refill  . acetaminophen (TYLENOL) 500 MG tablet Take 1,000 mg by mouth every 6 (six) hours as needed for headache (pain).     Marland Kitchen albuterol (VENTOLIN HFA) 108 (90 Base) MCG/ACT inhaler Inhale 1-2 puffs into the lungs every 4 (four) hours as needed for wheezing or shortness of breath. 8 g 2  . clopidogrel (PLAVIX) 75 MG tablet Take 75 mg by mouth daily.     . Colchicine (MITIGARE) 0.6 MG CAPS Take 0.6 mg by mouth 2 (  two) times daily. 60 capsule 1  . diazepam (VALIUM) 5 MG tablet Take 5 mg by mouth every 8 (eight) hours as needed (dizziness).     . fluticasone (FLONASE) 50 MCG/ACT nasal spray Place 2 sprays into both nostrils daily. 16 g 0  . ibuprofen (ADVIL) 400 MG tablet Take 1 tablet (400 mg total) by mouth 2 (two) times daily. 10 tablet 0  . lisinopril-hydrochlorothiazide (PRINZIDE,ZESTORETIC) 20-12.5 MG tablet Take 0.5 tablets by mouth every morning.     . loratadine (CLARITIN) 10 MG tablet Take 10 mg by  mouth every morning.     . Melatonin 5 MG TABS Take 5 mg by mouth at bedtime.     Marland Kitchen OVER THE COUNTER MEDICATION Place 1 drop into both eyes daily as needed (dry eyes). Over the counter lubricating eye drop     . pantoprazole (PROTONIX) 40 MG tablet Take 1 tablet (40 mg total) by mouth daily. 30 tablet 0  . pravastatin (PRAVACHOL) 40 MG tablet Take 40 mg by mouth every morning.     . tamsulosin (FLOMAX) 0.4 MG CAPS capsule Take 0.4 mg by mouth at bedtime.     . traMADol (ULTRAM) 50 MG tablet Take 50-100 mg by mouth every 6 (six) hours as needed for moderate pain or severe pain.     Marland Kitchen levETIRAcetam (KEPPRA) 250 MG tablet Take 375 mg by mouth 3 (three) times daily. Take 125 mg( 1/2 tab) po TID x 3 weeks, increase to 250 mg(1 tab) po TID for 3 weeks, increase to 375 mg TID (Patient not taking: Reported on 06/06/2020)    . mirtazapine (REMERON) 7.5 MG tablet Take 1 tablet (7.5 mg total) by mouth at bedtime. 20 tablet 0   No current facility-administered medications for this visit.   Facility-Administered Medications Ordered in Other Visits  Medication Dose Route Frequency Provider Last Rate Last Admin  . octreotide (SANDOSTATIN LAR) IM injection 30 mg  30 mg Intramuscular Once Alla Feeling, NP        PHYSICAL EXAMINATION: ECOG PERFORMANCE STATUS: 2-3  Vitals:   06/06/20 1140 06/06/20 1148  BP: 130/64   Pulse: 97   Resp: 18   Temp: 98.6 F (37 C) 98.6 F (37 C)  SpO2: 100%    Filed Weights   06/06/20 1140 06/06/20 1148  Weight: 143 lb 11.2 oz (65.2 kg) 143 lb 11.2 oz (65.2 kg)    GENERAL:alert, no distress and comfortable SKIN: No rash to exposed skin EYES: sclera clear NECK: Without mass LYMPH:  no palpable cervical or supraclavicular lymphadenopathy  LUNGS: clear with normal breathing effort HEART: regular rate & rhythm, no lower extremity edema ABDOMEN: abdomen soft with normal bowel sounds.  No palpable RUQ tenderness NEURO: alert & oriented x 3 with fluent speech, normal  gait  LABORATORY DATA:  I have reviewed the data as listed CBC Latest Ref Rng & Units 06/06/2020 05/21/2020 05/13/2020  WBC 4.0 - 10.5 K/uL 7.1 7.8 12.2(H)  Hemoglobin 13.0 - 17.0 g/dL 12.5(L) 11.1(L) 10.3(L)  Hematocrit 39 - 52 % 39.0 34.7(L) 32.8(L)  Platelets 150 - 400 K/uL 262 386 189     CMP Latest Ref Rng & Units 06/06/2020 05/21/2020 05/13/2020  Glucose 70 - 99 mg/dL 97 137(H) 116(H)  BUN 8 - 23 mg/dL 8 9 16   Creatinine 0.61 - 1.24 mg/dL 1.25(H) 1.24 1.35(H)  Sodium 135 - 145 mmol/L 137 136 132(L)  Potassium 3.5 - 5.1 mmol/L 3.5 3.8 3.7  Chloride 98 - 111 mmol/L 102  99 101  CO2 22 - 32 mmol/L 29 29 21(L)  Calcium 8.9 - 10.3 mg/dL 9.5 9.4 8.5(L)  Total Protein 6.5 - 8.1 g/dL 7.6 7.9 -  Total Bilirubin 0.3 - 1.2 mg/dL 0.7 0.6 -  Alkaline Phos 38 - 126 U/L 80 99 -  AST 15 - 41 U/L 26 43(H) -  ALT 0 - 44 U/L 47(H) 49(H) -      RADIOGRAPHIC STUDIES: I have personally reviewed the radiological images as listed and agreed with the findings in the report. No results found.   ASSESSMENT & PLAN: 74 year old male with history of stroke, HTN, HL, GERD, prostate cancer with  1.Well differentiated/G1 neuroendocrine tumor, consistent with GI primary with liver and nodal metastasis -He developed fatigue, mild weight loss, abdominal pain, and flushing over a year ago, after treatment for prostate cancer.  -due to rising PSA, CT/bone scan were obtained showing a mesenteric mass and single liver lesion. We do not have previous CT to compare.  -He underwent PET dotatate on 04/09/2020, which I previously reviewed with him, shows intense uptake in the central mesenteric mass with 3 well differentiated liver metastases and small peritoneal/nodal metastatic deposits along the left iliac vessels.  There is no primary bowel lesion identified.  There is a very small lesion in the pancreas with indeterminate activity.  The primary site was still undetermined -Chromogranin A on 8/2 is normal -We  reviewed liver biopsy from 04/30/2020 which shows metastatic well differentiated/G1 neuroendocrine tumor to the liver.  IHC stains positive for synaptophysin, chromogranin, CD56 and CDX2 and negative for TTF-1-1.  Overall consistent with a GI primary.  Ki-67 of 1% -his case was discussed in tumor board, unfortunately this is metastatic disease and surgical resection is not an option.   -he began first line Sandostatin 30 mg on 05/10/20, goal is palliative/disease control  -9/11 he developed acute chest pain and dyspnea, initial cardiac work-up showed STEMI, cath showed normal coronaries, he was treated for suspected pericarditis during hospital admission from 9/11-9/13 -Outpatient MRI of the abdomen ruled out pancreas as the primary site on 05/16/20  -Mr. Bendickson appears stable, he has no recurrent chest pain episodes since hospitalization but has not completely recovered, still weak and deconditioned with over 10 pounds weight loss -CBC and CMP shows improved anemia and LFTs, mildly elevated Scr. We encouraged oral hydration -the patient was seen with Dr. Burr Medico, the recommendation is to hold treatment. Referrals placed to PT and dietitian.   -We will see him back in a month to monitor his recovery and discuss resuming treatment  2.  Suspected pericarditis  -Received first octreotide on 9/9, on 9/11 he developed acute chest pain, nausea, diaphoresis hoarseness -ED 05/12/2020 found to have inferior STE, echo showed preserved LV function and small pericardial effusion -CTA negative for aortic dissection -urgent cath showed normal coronaries -Admitted and treated for suspected acute pericarditis with colchicine 0.6 p.o. twice daily x3 months and ibuprofen.  He remained stable and was discharged home on 05/14/2020.  -Patient recently stopped colchicine on his own due to concern for polypharmacy -Cardiology follow-up 10/8  3.  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 Tammi Klippel -PSA down to 1.8 on 08/2018, steady rise  to 4.69 on 6/21 -CT/bone scan in 6/21 showed no obvious metastatic disease from prostate cancerbut did reveal mesenteric mass and liver lesion ultimately biopsy-proven neuroendocrine tumor -Followed by Dr. [facility], further work-up/treatment pending.  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 CBC with Differential (Cancer Center Only)    Standing Status:   Future    Number of Occurrences:   1    Standing Expiration Date:   06/06/2021  . CMP ([place] only)    Standing Status:   Future    Number of Occurrences:   1     Standing Expiration Date:   06/06/2021  . Draw extra clot specimen    Standing Status:   Future    Number of Occurrences:   1    Standing Expiration Date:   06/06/2021  . Ambulatory referral to Physical Therapy    Referral Priority:   Routine    Referral Type:   Physical Medicine    Referral Reason:   Specialty Services Required    Requested Specialty:   Physical Therapy    Number of Visits Requested:   1  . Ambulatory referral to Nutrition and Diabetic E    Referral Priority:   Urgent    Referral Type:   Consultation    Referral Reason:   Specialty Services Required    Number of Visits Requested:   1   All questions were answered. The patient knows to call the clinic with any problems, questions or concerns. No barriers to learning were detected.     Alla Feeling, NP 06/06/20   Addendum  I have seen the patient, examined him. I agree with the assessment and and plan and have edited the notes.   Patient developed acute pericarditis right after the first dose of Sandostatin injection.  He is still very fatigued, with low appetite, poor sleep.  Also I do not think his pericarditis was related to Sandostatin injection, I think it is reasonable to hold on treatment for today and next month, to allow him more time to recover.  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  We discussed symptom management, I recommend him to try mirtazapine, starting at 7.5 mg daily at bedtime, and titrate as needed, to stimulate his appetite and improve his sleep.  Benefit and potential side effects discussed with him, he agrees.  We will also refer him to physical therapy and dietician.  He will follow up with cardiology.   Will see him back in a month.   Truitt Merle  06/06/2020

## 2020-06-06 ENCOUNTER — Encounter: Payer: Self-pay | Admitting: Nurse Practitioner

## 2020-06-06 ENCOUNTER — Other Ambulatory Visit: Payer: Self-pay

## 2020-06-06 ENCOUNTER — Telehealth: Payer: Self-pay | Admitting: Physician Assistant

## 2020-06-06 ENCOUNTER — Inpatient Hospital Stay: Payer: Medicare HMO

## 2020-06-06 ENCOUNTER — Inpatient Hospital Stay: Payer: Medicare HMO | Attending: Nurse Practitioner | Admitting: Nurse Practitioner

## 2020-06-06 VITALS — BP 130/64 | HR 97 | Temp 98.6°F | Resp 18 | Ht 68.0 in | Wt 143.7 lb

## 2020-06-06 DIAGNOSIS — Z8673 Personal history of transient ischemic attack (TIA), and cerebral infarction without residual deficits: Secondary | ICD-10-CM | POA: Insufficient documentation

## 2020-06-06 DIAGNOSIS — Z8042 Family history of malignant neoplasm of prostate: Secondary | ICD-10-CM | POA: Insufficient documentation

## 2020-06-06 DIAGNOSIS — C7B02 Secondary carcinoid tumors of liver: Secondary | ICD-10-CM | POA: Insufficient documentation

## 2020-06-06 DIAGNOSIS — C7B8 Other secondary neuroendocrine tumors: Secondary | ICD-10-CM

## 2020-06-06 DIAGNOSIS — Z800 Family history of malignant neoplasm of digestive organs: Secondary | ICD-10-CM | POA: Insufficient documentation

## 2020-06-06 DIAGNOSIS — C7A00 Malignant carcinoid tumor of unspecified site: Secondary | ICD-10-CM | POA: Diagnosis not present

## 2020-06-06 DIAGNOSIS — I10 Essential (primary) hypertension: Secondary | ICD-10-CM | POA: Diagnosis not present

## 2020-06-06 DIAGNOSIS — Z79899 Other long term (current) drug therapy: Secondary | ICD-10-CM | POA: Diagnosis not present

## 2020-06-06 DIAGNOSIS — E7800 Pure hypercholesterolemia, unspecified: Secondary | ICD-10-CM | POA: Diagnosis not present

## 2020-06-06 DIAGNOSIS — Z923 Personal history of irradiation: Secondary | ICD-10-CM | POA: Diagnosis not present

## 2020-06-06 DIAGNOSIS — Z7189 Other specified counseling: Secondary | ICD-10-CM

## 2020-06-06 DIAGNOSIS — G250 Essential tremor: Secondary | ICD-10-CM | POA: Diagnosis not present

## 2020-06-06 DIAGNOSIS — Z8546 Personal history of malignant neoplasm of prostate: Secondary | ICD-10-CM | POA: Diagnosis not present

## 2020-06-06 DIAGNOSIS — I319 Disease of pericardium, unspecified: Secondary | ICD-10-CM | POA: Insufficient documentation

## 2020-06-06 DIAGNOSIS — N529 Male erectile dysfunction, unspecified: Secondary | ICD-10-CM | POA: Insufficient documentation

## 2020-06-06 DIAGNOSIS — Z803 Family history of malignant neoplasm of breast: Secondary | ICD-10-CM | POA: Insufficient documentation

## 2020-06-06 DIAGNOSIS — Z7902 Long term (current) use of antithrombotics/antiplatelets: Secondary | ICD-10-CM | POA: Insufficient documentation

## 2020-06-06 LAB — CMP (CANCER CENTER ONLY)
ALT: 47 U/L — ABNORMAL HIGH (ref 0–44)
AST: 26 U/L (ref 15–41)
Albumin: 3.8 g/dL (ref 3.5–5.0)
Alkaline Phosphatase: 80 U/L (ref 38–126)
Anion gap: 6 (ref 5–15)
BUN: 8 mg/dL (ref 8–23)
CO2: 29 mmol/L (ref 22–32)
Calcium: 9.5 mg/dL (ref 8.9–10.3)
Chloride: 102 mmol/L (ref 98–111)
Creatinine: 1.25 mg/dL — ABNORMAL HIGH (ref 0.61–1.24)
GFR, Estimated: 57 mL/min — ABNORMAL LOW (ref >60)
Glucose, Bld: 97 mg/dL (ref 70–99)
Potassium: 3.5 mmol/L (ref 3.5–5.1)
Sodium: 137 mmol/L (ref 135–145)
Total Bilirubin: 0.7 mg/dL (ref 0.3–1.2)
Total Protein: 7.6 g/dL (ref 6.5–8.1)

## 2020-06-06 LAB — CBC WITH DIFFERENTIAL (CANCER CENTER ONLY)
Abs Immature Granulocytes: 0.01 10*3/uL (ref 0.00–0.07)
Basophils Absolute: 0.1 10*3/uL (ref 0.0–0.1)
Basophils Relative: 1 %
Eosinophils Absolute: 0.1 10*3/uL (ref 0.0–0.5)
Eosinophils Relative: 1 %
HCT: 39 % (ref 39.0–52.0)
Hemoglobin: 12.5 g/dL — ABNORMAL LOW (ref 13.0–17.0)
Immature Granulocytes: 0 %
Lymphocytes Relative: 21 %
Lymphs Abs: 1.5 10*3/uL (ref 0.7–4.0)
MCH: 28.8 pg (ref 26.0–34.0)
MCHC: 32.1 g/dL (ref 30.0–36.0)
MCV: 89.9 fL (ref 80.0–100.0)
Monocytes Absolute: 0.6 10*3/uL (ref 0.1–1.0)
Monocytes Relative: 9 %
Neutro Abs: 4.9 10*3/uL (ref 1.7–7.7)
Neutrophils Relative %: 68 %
Platelet Count: 262 10*3/uL (ref 150–400)
RBC: 4.34 MIL/uL (ref 4.22–5.81)
RDW: 12.5 % (ref 11.5–15.5)
WBC Count: 7.1 10*3/uL (ref 4.0–10.5)
nRBC: 0 % (ref 0.0–0.2)

## 2020-06-06 MED ORDER — OCTREOTIDE ACETATE 30 MG IM KIT: 30.0000 mg | PACK | Freq: Once | INTRAMUSCULAR | Status: DC

## 2020-06-06 MED ORDER — MIRTAZAPINE 7.5 MG PO TABS: 7.5000 mg | ORAL_TABLET | Freq: Every day | ORAL | 0 refills | Status: DC

## 2020-06-06 MED ORDER — OCTREOTIDE ACETATE 30 MG IM KIT
PACK | INTRAMUSCULAR | Status: AC
  Filled 2020-06-06: qty 1

## 2020-06-06 NOTE — Telephone Encounter (Signed)
   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 He continues to recover from hospitalization. We held his octreotide today to allow him to recover better. FYI he stopped colchicine recently due to concern that polypharmacy is causing him to be weak, lose weight, etc. I encouraged him to resume therapy. You will see him 10/8. "  I have not met patient yet so reviewed chart in anticipation of upcoming visit. Per Dr. Hassell Done last note, the patient did have some stomach upset the day of discharge felt related to new meds. He had recommended to decrease dose of colchicine if this persisted. (The patient was dischargedon standard dose of colchicine 0.$RemoveBeforeD'6mg'[id number]$  BID.) I called the patient and recommended for him to restart on a trial of a lower dose at 0.$RemoveB'6mg'bocSpwEn$  once daily only. He is agreeable. Keep f/u on Friday as planned. Will cc to Dr. Irish Lack so he's aware.   Ryan Senkbeil PA-C

## 2020-06-06 NOTE — Progress Notes (Signed)
Patient arrived to flush for Sandostatin injection.  Writer was unable to find [name] and called him.  Ryan Wilkerson stated he is almost home Sandostatin is on hold today per provider.  Pharmacist and provider notified and confirmed.  Medication returned to pharmacy.

## 2020-06-07 ENCOUNTER — Telehealth: Payer: Self-pay | Admitting: Nurse Practitioner

## 2020-06-07 NOTE — Progress Notes (Signed)
Cardiology Office Note  Date:  06/07/2020   ID:  Ryan Wilkerson, DOB Jan 24, 1946, MRN [id number]  PCP:  Ryan Wilkerson.Ryan Sa, MD  Cardiologist:  Ryan. Angelena Wilkerson  _____________  Hospital follow-up  _____________   History of Present Illness: Ryan Wilkerson is a 74 y.o. male with pmh of smoking, HTN, HLD, prior CVA, and recently diagnosed metastatic neuroendocrine tumor who presents for hospital follow-up. He was admitted 9/11-9/13 for chest pain. Initial EKG was concerning for ST changes inferiorly. Bedside echo showed preserved LV function and small circumferential pericardial effusion. CTA showed was negative for dissection. 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 He underwent cath showing normal coronaries.  Symptoms were felt to be consisted with acute pericarditis and he was started on colchicine 0.6mg  BID x 3 months and ibuprofen 400mg  BID to complete 5 additional days with improvement of symptoms. Follow-up echo showed normal EF and small pericardial effusion.   He was seen by oncology 05/21/20 reporting weakness felt to be from polypharmacy and octreotide was held. D-dimer was obtained which was elevated.  Also noted some stomach upset. Colchine was changed to 0.6mg  daily.   Today, he reports he is feeling much better. He said after discharge he had very low energy, sob, and some coughing. He saw the cancer center and they decided to no continue on with the injections for the tumor since that is what seemed to precipitate his hospitalization. Patient has been taking colchicine once daily. About 4 days ago he finally started feeling better. His energy level is almost back to normal. He breathing is much better. One og his inhalers was changed and this seemed to have improved his cough. He denies cehst pain. No lower leg edema. He does have some dizziness when he sits and stand up, however this is  chronic for him. Will check orthostatics to be sure. He says appetite had been low and cancer center also started him on a medication to increase his appetite. He denies symptoms of palpitations,  orthopnea, PND, claudication, dizziness, syncope, bleeding, or neurologic sequela. The patient is tolerating medications without difficulties and is otherwise without complaint today.   _____________   Past Medical History:  Diagnosis Date  . Erectile dysfunction   . Essential tremor   . Family history of breast cancer   . Family history of prostate cancer   . Family history of stomach cancer   . GERD (gastroesophageal reflux disease)   . History of colon polyps   . Hypercholesterolemia   . Hypertension   . Nocturia   . Prostate cancer [facility]) urologist-- Ryan Ryan Wilkerson /  oncologist-- Ryan Wilkerson   prostate bx 06-24-2016 and 09-15-2017 at Ryan Ryan Wilkerson office  Stage T1c, Gleason 4+3, PSA 8.48, vol 33cc---  planned external beam radiation therpay  . Urgency of urination   . Wears glasses    Past Surgical History:  Procedure Laterality Date  . COLONOSCOPY  last one 06/ 2018  . GOLD SEED IMPLANT N/A 12/23/2017   Procedure: GOLD SEED IMPLANT;  Surgeon: Ryan Frock, MD;  Location: [facility];  Service: Urology;  Laterality: N/A;  . LEFT HEART CATH AND CORONARY ANGIOGRAPHY N/A 05/12/2020   Procedure: LEFT HEART CATH AND CORONARY ANGIOGRAPHY;  Surgeon: Ryan Blanks, MD;  Location: [facility];  Service: Cardiovascular;  Laterality: N/A;  . PROSTATE BIOPSY  06-24-2016;  09-15-2017-- at Ryan Ryan Wilkerson office  . SPACE OAR INSTILLATION N/A 12/23/2017  Procedure: SPACE OAR INSTILLATION;  Surgeon: Ryan Frock, MD;  Location: [facility];  Service: Urology;  Laterality: N/A;   _____________  Current Outpatient Medications  Medication Sig Dispense Refill  . acetaminophen (TYLENOL) 500 MG tablet Take 1,000 mg by mouth every 6 (six) hours as needed for headache (pain).      Marland Kitchen albuterol (VENTOLIN HFA) 108 (90 Base) MCG/ACT inhaler Inhale 1-2 puffs into the lungs every 4 (four) hours as needed for wheezing or shortness of breath. 8 g 2  . clopidogrel (PLAVIX) 75 MG tablet Take 75 mg by mouth daily.     . Colchicine (MITIGARE) 0.6 MG CAPS Take 0.6 mg by mouth 2 (two) times daily. 60 capsule 1  . diazepam (VALIUM) 5 MG tablet Take 5 mg by mouth every 8 (eight) hours as needed (dizziness).     . fluticasone (FLONASE) 50 MCG/ACT nasal spray Place 2 sprays into both nostrils daily. 16 g 0  . ibuprofen (ADVIL) 400 MG tablet Take 1 tablet (400 mg total) by mouth 2 (two) times daily. 10 tablet 0  . levETIRAcetam (KEPPRA) 250 MG tablet Take 375 mg by mouth 3 (three) times daily. Take 125 mg( 1/2 tab) po TID x 3 weeks, increase to 250 mg(1 tab) po TID for 3 weeks, increase to 375 mg TID (Patient not taking: Reported on 06/06/2020)    . lisinopril-hydrochlorothiazide (PRINZIDE,ZESTORETIC) 20-12.5 MG tablet Take 0.5 tablets by mouth every morning.     . loratadine (CLARITIN) 10 MG tablet Take 10 mg by mouth every morning.     . Melatonin 5 MG TABS Take 5 mg by mouth at bedtime.     . mirtazapine (REMERON) 7.5 MG tablet Take 1 tablet (7.5 mg total) by mouth at bedtime. 20 tablet 0  . OVER THE COUNTER MEDICATION Place 1 drop into both eyes daily as needed (dry eyes). Over the counter lubricating eye drop     . pantoprazole (PROTONIX) 40 MG tablet Take 1 tablet (40 mg total) by mouth daily. 30 tablet 0  . pravastatin (PRAVACHOL) 40 MG tablet Take 40 mg by mouth every morning.     . tamsulosin (FLOMAX) 0.4 MG CAPS capsule Take 0.4 mg by mouth at bedtime.     . traMADol (ULTRAM) 50 MG tablet Take 50-100 mg by mouth every 6 (six) hours as needed for moderate pain or severe pain.      No current facility-administered medications for this visit.   _____________   Allergies:   Aspirin and Simvastatin  _____________   Social History:  The patient  reports that he quit smoking about  46 years ago. His smoking use included cigarettes. He has a 9.00 pack-year smoking history. He has never used smokeless tobacco. He reports that he does not drink alcohol and does not use drugs.  _____________   Family History:  The patient's family history includes Breast cancer (age of onset: 26) in his sister; Lung cancer in his brother and sister; Prostate cancer in his brother, brother, brother, brother, brother and other family members; Prostate cancer (age of onset: 28) in his brother.  _____________   ROS:  Please see the history of present illness.   Positive for dizziness upon standing,   All other systems are reviewed and negative.  _____________   PHYSICAL EXAM: VS:  There were no vitals taken for this visit. , BMI There is no height or weight on file to calculate BMI. GEN: Well nourished, well developed, in no acute distress  HEENT: normal  Neck: no JVD, carotid bruits, or masses Cardiac: RRR; no murmurs, rubs, or gallops. No clubbing, cyanosis, edema.  Radials/DP/PT 2+ and equal bilaterally.  Respiratory:  clear to auscultation bilaterally, normal work of breathing GI: soft, nontender, nondistended, + BS MS: no deformity or atrophy  Skin: warm and dry, no rash Neuro:  Strength and sensation are intact Psych: euthymic mood, full affect _____________  EKG:   N/A  Recent Labs: 05/13/2020: Magnesium 1.8 06/06/2020: ALT 47; BUN 8; Creatinine 1.25; Hemoglobin 12.5; Platelet Count 262; Potassium 3.5; Sodium 137  05/12/2020: Cholesterol 129; HDL 48; LDL Cholesterol 71; Total CHOL/HDL Ratio 2.7; Triglycerides 50; VLDL 10  Estimated Creatinine Clearance: 48.5 mL/min (A) (by C-G formula based on SCr of 1.25 mg/dL (H)).  Wt Readings from Last 3 Encounters:  06/06/20 143 lb 11.2 oz (65.2 kg)  05/21/20 153 lb 9.6 oz (69.7 kg)  05/14/20 158 lb 4.6 oz (71.8 kg)    Echo 06/2020  1. Left ventricular ejection fraction, by estimation, is 55 to 60%. The  left ventricle has normal  function. The left ventricle has no regional  wall motion abnormalities. There is mild concentric left ventricular  hypertrophy. Left ventricular diastolic  parameters are consistent with Grade I diastolic dysfunction (impaired  relaxation).  2. Right ventricular systolic function is normal. The right ventricular  size is normal. There is normal pulmonary artery systolic pressure. The  estimated right ventricular systolic pressure is 57.8 mmHg.  3. The mitral valve is normal in structure. No evidence of mitral valve  regurgitation. No evidence of mitral stenosis.  4. The aortic valve is normal in structure. Aortic valve regurgitation is  not visualized. No aortic stenosis is present.  5. The inferior vena cava is normal in size with greater than 50%  respiratory variability, suggesting right atrial pressure of 3 mmHg.    Cardiac cath 05/2020 No angiographic evidence of CAD  Recommendations: Pt with ongoing chest pain but normal coronary arteries. No aortic dissection on chest CTA. Suspect pericarditis. Will admit to the ICU and attempt pain control with narcotics. Echo in the am. Will start colchicine.   Echo 05/2020 1. Small pericardial effusion no evidence of tamponade.  2. Abnormal septal motion . Left ventricular ejection fraction, by  estimation, is 50 to 55%. The left ventricle has low normal function. The  left ventricle has no regional wall motion abnormalities. Left ventricular  diastolic parameters were normal.  3. Right ventricular systolic function is normal. The right ventricular  size is normal. There is normal pulmonary artery systolic pressure.  4. A small pericardial effusion is present.  5. The mitral valve is normal in structure. No evidence of mitral valve  regurgitation. No evidence of mitral stenosis.  6. Tricuspid valve regurgitation is mild to moderate.  7. The aortic valve is tricuspid. Aortic valve regurgitation is not  visualized. Mild aortic  valve sclerosis is present, with no evidence of  aortic valve stenosis.  8. The inferior vena cava is dilated in size with >50% respiratory  variability, suggesting right atrial pressure of 8 mmHg.  _____________   ASSESSMENT AND PLAN:  Acute pericarditis - Admitted last month for chest pain. Cath showed normal coronaries. Felt to be acute pericarditis and started on colchicine for 3 months and ibuprofen for 5 days - Recently the patient called to report stomach upset and colchicine was decreased from BID to once daily - Patient denies recurrent chest pain. Finally starting to feel back to normal. Energy improving  and breathing stable. Appetite is low - continue colchicine and follow-up in 3 months  Pericardial effusion - small without tamponade on initial echo 05/12/20 - Follow-up echo showed no pericardial effusion  HTN - BP today 136/84 - Patient reports dizziness upon standing, however is chronic. Orthostatics showed lying 125/78, HR 77, sitting 128/78, HR 89, standing 118/71, HR 110, standing 119/77, Hr 119. Note increase in heart rate.  - Encouraged patient to increase oral intake.Cancer center started him on an medication to increase appetite.  Also recommended compression stockings for symptoms  - continue lisinopril-HCTZ  HLD - continue pravastatin  Elevated D-dimer - Cancer center checked D-dimer because patient reported SOB, D-dimer was up to 4. Since then breathing has seemed to improve with change of inhaler. Also patient is not tachycardic or tachypneic today on exam. Follow-up echo showed no RV dysfunction. No further work-up   Disposition:   FU with MD/APP in 3 months   Signed, [place] Silveria Ninfa Meeker, PA-C 06/07/2020 10:14 AM    _____________ [facility] [address] [place] [address]  [phone] (office) [phone] (fax)

## 2020-06-07 NOTE — Telephone Encounter (Signed)
Scheduled per 10/6 los. Cancelled injection appt on 1/4. Pt stated that injection should be pushed out, okay to cancel per YF. Pt is aware of appt for follow up.

## 2020-06-08 ENCOUNTER — Ambulatory Visit: Payer: Medicare HMO | Admitting: Medical

## 2020-06-08 ENCOUNTER — Encounter: Payer: Self-pay | Admitting: Physician Assistant

## 2020-06-08 ENCOUNTER — Other Ambulatory Visit: Payer: Self-pay

## 2020-06-08 VITALS — BP 136/84 | HR 84 | Ht 68.0 in | Wt 145.4 lb

## 2020-06-08 DIAGNOSIS — I313 Pericardial effusion (noninflammatory): Secondary | ICD-10-CM

## 2020-06-08 DIAGNOSIS — I10 Essential (primary) hypertension: Secondary | ICD-10-CM

## 2020-06-08 DIAGNOSIS — I3139 Other pericardial effusion (noninflammatory): Secondary | ICD-10-CM

## 2020-06-08 DIAGNOSIS — I300 Acute nonspecific idiopathic pericarditis: Secondary | ICD-10-CM | POA: Diagnosis not present

## 2020-06-08 DIAGNOSIS — E785 Hyperlipidemia, unspecified: Secondary | ICD-10-CM

## 2020-06-08 NOTE — Patient Instructions (Signed)
Medication Instructions:  Your physician recommends that you continue on your current medications as directed. Please refer to the Current Medication list given to you today.  *If you need a refill on your cardiac medications before your next appointment, please call your pharmacy*   Lab Work: None ordered  If you have labs (blood work) drawn today and your tests are completely normal, you will receive your results only by: Marland Kitchen MyChart Message (if you have MyChart) OR . A paper copy in the mail If you have any lab test that is abnormal or we need to change your treatment, we will call you to review the results.   Testing/Procedures: None ordered   Follow-Up: At [facility], you and your health needs are our priority.  As part of our continuing mission to provide you with exceptional heart care, we have created designated Provider Care Teams.  These Care Teams include your primary Cardiologist (physician) and Advanced Practice Providers (APPs -  Physician Assistants and Nurse Practitioners) who all work together to provide you with the care you need, when you need it.  We recommend signing up for the patient portal called "MyChart".  Sign up information is provided on this After Visit Summary.  MyChart is used to connect with patients for Virtual Visits (Telemedicine).  Patients are able to view lab/test results, encounter notes, upcoming appointments, etc.  Non-urgent messages can be sent to your provider as well.   To learn more about what you can do with MyChart, go to [email or web address].    Your next appointment:   3 month(s)  The format for your next appointment:   In Person  Provider:   You may see Lauree Chandler, MD or one of the following Advanced Practice Providers on your designated Care Team:    Melina Copa, PA-C  Ermalinda Barrios, PA-C    Other Instructions

## 2020-06-12 ENCOUNTER — Inpatient Hospital Stay: Payer: Medicare HMO | Admitting: Nutrition

## 2020-06-12 ENCOUNTER — Telehealth: Payer: Self-pay | Admitting: Nutrition

## 2020-06-12 ENCOUNTER — Other Ambulatory Visit: Payer: Self-pay

## 2020-06-12 NOTE — Telephone Encounter (Signed)
Contacted patient by telephone but he was not available. I will try to contact him at another time.

## 2020-06-12 NOTE — Progress Notes (Signed)
See progress note.

## 2020-06-13 ENCOUNTER — Telehealth: Payer: Self-pay | Admitting: Nutrition

## 2020-06-13 NOTE — Telephone Encounter (Signed)
Contacted patient by telephone to follow-up on missed appointment yesterday.  Patient is a 74 year old male diagnosed with metastatic neuroendocrine tumor in July 2021.  He is a patient of Dr. Burr Medico. He received octreotide.  Past medical history includes prostate cancer in 2018, GERD, hypertension, hypercholesterolemia.  Medications include Remeron and Protonix.  Labs include creatinine 1.25.  Height: 68 inches. Weight: 145.4 pounds. Usual body weight: 158 pounds in September 2021. BMI: 22.11.  Reports his appetite is slowly improving.  He is concerned and endorses recent weight loss. Reports he is lactose intolerance. Reports increased gas causing abdominal pain. Recent constipation however this is resolving.  Nutrition diagnosis: Unintended weight loss related to cancer and associated treatments as evidenced by 8% weight loss in less than 2 months which is significant.  Intervention: Educated patient on strategies for increasing calories and protein in small frequent meals and snacks. Reviewed high-protein foods. Recommended patient try Ensure plus or equivalent twice daily between meals. Explained these are lactose free. Reviewed strategies for decreasing gas. Brief education provided on low-sodium foods as patient is trying to limit sodium secondary to hypertension. I will mail fact sheets, coupons, and contact information. Patient questions were answered and teach back method used.  Monitoring, evaluation, goals: Patient will tolerate increased calories and protein to minimize weight loss.  Next visit: To be scheduled as needed.  **Disclaimer: This note was dictated with voice recognition software. Similar sounding words can inadvertently be transcribed and this note may contain transcription errors which may not have been corrected upon publication of note.**

## 2020-06-13 NOTE — Progress Notes (Signed)
Sandostatin Lar 30mg  DOS: 06/06/2020. Medication was ordered and primed waiting to give to patient. When Patient was called back for injection could not be found. He was called and advised that provider told to hold injection for right now.  Pending replacement from [facility]. Documentation filled out for replacement request 06/13/2020.

## 2020-06-18 ENCOUNTER — Ambulatory Visit: Payer: Medicare HMO | Attending: Nurse Practitioner

## 2020-06-18 ENCOUNTER — Other Ambulatory Visit: Payer: Self-pay

## 2020-06-18 DIAGNOSIS — R262 Difficulty in walking, not elsewhere classified: Secondary | ICD-10-CM

## 2020-06-18 DIAGNOSIS — M6281 Muscle weakness (generalized): Secondary | ICD-10-CM

## 2020-06-18 NOTE — Therapy (Signed)
[place], [place], [address] Phone: [phone]   Fax:  [phone]  Physical Therapy Evaluation  Patient Details  Name: Ryan Wilkerson MRN: [id number] Date of Birth: 1946/08/23 Referring Provider (PT): Cira Rue NP   Encounter Date: 06/18/2020   PT End of Session - 06/18/20 1731    Visit Number 1    Number of Visits 12    Date for PT Re-Evaluation 07/30/20    PT Start Time 8676    PT Stop Time 1453    PT Time Calculation (min) 46 min    Activity Tolerance Patient tolerated treatment well    Behavior During Therapy [facility] for tasks assessed/performed           Past Medical History:  Diagnosis Date  . Erectile dysfunction   . Essential tremor   . Family history of breast cancer   . Family history of prostate cancer   . Family history of stomach cancer   . GERD (gastroesophageal reflux disease)   . History of colon polyps   . Hypercholesterolemia   . Hypertension   . Nocturia   . Prostate cancer [facility]) urologist-- dr Tresa Moore /  oncologist-- dr manning   prostate bx 06-24-2016 and 09-15-2017 at dr Tresa Moore office  Stage T1c, Gleason 4+3, PSA 8.48, vol 33cc---  planned external beam radiation therpay  . Urgency of urination   . Wears glasses     Past Surgical History:  Procedure Laterality Date  . COLONOSCOPY  last one 06/ 2018  . GOLD SEED IMPLANT N/A 12/23/2017   Procedure: GOLD SEED IMPLANT;  Surgeon: Alexis Frock, MD;  Location: [facility];  Service: Urology;  Laterality: N/A;  . LEFT HEART CATH AND CORONARY ANGIOGRAPHY N/A 05/12/2020   Procedure: LEFT HEART CATH AND CORONARY ANGIOGRAPHY;  Surgeon: Burnell Blanks, MD;  Location: [place] [facility];  Service: Cardiovascular;  Laterality: N/A;  . PROSTATE BIOPSY  06-24-2016;  09-15-2017-- at dr Tresa Moore office  . SPACE OAR INSTILLATION N/A 12/23/2017   Procedure: SPACE OAR INSTILLATION;  Surgeon: Alexis Frock, MD;   Location: [facility];  Service: Urology;  Laterality: N/A;    There were no vitals filed for this visit.    Subjective Assessment - 06/18/20 1411    Subjective Having alot of pain in his stomach today, but feels OK to be here.  Saw a dietician on Friday that gave me some good ideas about foods to eat, and I have tried to, but no change yet.  He doesn't have a good appetite.  Feels really weak.  Can't take walks like he used to.  He just gives out.  Started a cancer treatment in august that is supposed to be every 28 days, but it was stopped because he had to go to the ED.  Pt. lost 15 lbs.    Pertinent History Pt had prostate Cancer with seeding in 11/2017.  They had to stop the treatment because of problems with liver, pancreas and small intestine (METS)Treatment is on hold now for a couple of months to help him try and get stronger.    How long can you sit comfortably? unlimited    How long can you stand comfortably? 3-5 min;  gets a little dizzy sometimes    How long can you walk comfortably? 100 yds before he has to rest;Gets hot flashes but don't last long but usually happen when in standing position    Patient Stated  Goals to turn wt loss around, be able to walk a little longer, have more endurance    Currently in Pain? 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  Yes    Is the patient reluctant to leave their home because of a fear of falling?  Yes      [place] residence    Living Arrangements Spouse/significant other    Available Help at Discharge Family    Home Access Level entry      Prior Function   Level of [place] Retired    Environmental health practitioner   Overall Cognitive Status Within Functional Limits for tasks assessed      Observation/Other Assessments   Observations no breakdown of skin      Posture/Postural Control   Posture/Postural Control Postural limitations    Postural Limitations Rounded Shoulders      Strength   Overall Strength Within functional limits for tasks performed    Overall Strength Comments 5/5 LE,except bilateral HS, Hip flexors, quads, hip IR  4/5- 4+/5, UE 5/5 but did not test grip      Transfers   Five time sit to stand comments  30 seconds   slow and fatigued; less than 12 in 30 sec is risk for falls     Ambulation/Gait   Assistive device Straight cane   but only uses for longer distances.  Does not have with him    Gait Pattern Step-through pattern;Decreased arm swing - left      Standardized Balance Assessment   Standardized Balance Assessment --   passed 4 stage balance test 10 seconds each   Five times sit to stand comments  30 seconds      Timed Up and Go Test   Normal TUG (seconds) 14    TUG Comments indicates higher fall risk                      Objective measurements completed on examination: See above findings.                 PT Short Term Goals - 06/18/20 1918      PT SHORT TERM GOAL #1   Title Pt. will be independent in a HEP for generalized  strengthening, balance and endurance training    Time 3    Period Weeks    Status New    Target Date 07/09/20             PT Long Term Goals - 06/18/20 1923      PT LONG TERM GOAL #1   Title Pt will be able to complete TUG in 12 sec or less for decreased risk of falls    Baseline 14    Time 4    Period Weeks    Status New    Target Date 07/16/20      PT LONG TERM GOAL #2   Title Pt will be able to perform 12 sit to stands in 30 second sit to stand test for decreased risk of falls    Baseline 5    Time 5    Period Weeks    Status New    Target Date 07/23/20      PT LONG TERM GOAL #3   Title --    Baseline 100 yd    Time 5    Period Weeks    Status New    Target Date 07/23/20      PT LONG TERM GOAL #4   Title Pt will have bilateral LE strength 5/5    Time 6    Period Weeks    Status New    Target Date 07/30/20                  Plan - 06/18/20 1733    Clinical Impression Statement Pt. had 7/10 stomach pain at initiation of PT visit, but wanted to continue.  He was able to tolerate strength testing of UE and LE's, TUG, sit to stand test for 30 sec and 4 Stage balance test.  His score of 14 secs on TUG  is indicative of increased fall risk.  He passed the 4 Stage Balance test maintaining each position for 10 seconds, but score of 5 on 30 second sit to stand test also indicates risk for falls.  He was mildly short of breath after performing sit to stand and was given rest .  He reported that his stomach felt better after he exercised a little.    Personal Factors and Comorbidities Age;Comorbidity 1;Comorbidity 2    Comorbidities Prior prostate Cancer, neuroendocrine tumor with METs, Prior CVA    Examination-Activity Limitations Locomotion Level;Stairs;Transfers;Stand    Examination-Participation Restrictions Community Activity;Yard Work;Other   unable to play golf   Stability/Clinical Decision Making Stable/Uncomplicated    Rehab Potential Good    PT Frequency  2x / week    PT Duration 6 weeks    PT Treatment/Interventions Balance training;Therapeutic exercise;Therapeutic activities;Gait training;Patient/family education;Energy conservation           Patient will benefit from skilled therapeutic intervention in order to improve the following deficits and impairments:  Decreased activity tolerance, Decreased endurance, Decreased strength, Impaired perceived functional ability, Difficulty walking, Dizziness  Visit Diagnosis: Muscle weakness (generalized)  Difficulty in walking, not elsewhere classified     Problem List Patient Active Problem List   Diagnosis Date Noted  . ST elevation myocardial infarction (STEMI) ([place])   . Acute idiopathic pericarditis   . Chest pain 05/12/2020  . Chest pain of uncertain etiology   . 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 Goals of care, counseling/discussion 05/01/2020  . Genetic testing 11/13/2017  . Family history of prostate cancer   . Family history  of breast cancer   . Family history of stomach cancer   . Malignant neoplasm of prostate ([place]) 08/31/2017    Claris Pong, PT 06/18/2020, 7:59 PM  [place], [place], [address] Phone: [phone]   Fax:  [phone]  Name: Ryan Wilkerson MRN: [id number] Date of Birth: 02-12-1946

## 2020-06-18 NOTE — Patient Instructions (Signed)
Pt. Instructed  to try walking in his home 3x's per day for 5 min to help build endurance.  He has been doing a route in his home for about 5 min 1-2 x's/ day

## 2020-06-20 ENCOUNTER — Ambulatory Visit: Payer: Medicare HMO

## 2020-06-20 ENCOUNTER — Other Ambulatory Visit: Payer: Self-pay

## 2020-06-20 DIAGNOSIS — M6281 Muscle weakness (generalized): Secondary | ICD-10-CM

## 2020-06-20 DIAGNOSIS — R262 Difficulty in walking, not elsewhere classified: Secondary | ICD-10-CM | POA: Diagnosis not present

## 2020-06-20 NOTE — Therapy (Signed)
[place], [place], [address] Phone: [phone]   Fax:  [phone]  Physical Therapy Treatment  Patient Details  Name: Ryan Wilkerson MRN: [id number] Date of Birth: 1946-05-18 Referring Provider (PT): Cira Rue NP   Encounter Date: 06/20/2020   PT End of Session - 06/20/20 1617    Visit Number 2    Number of Visits 12    Date for PT Re-Evaluation 07/30/20    PT Start Time 1508    PT Stop Time 1245    PT Time Calculation (min) 47 min    Activity Tolerance Patient tolerated treatment well;No increased pain    Behavior During Therapy WFL for tasks assessed/performed           Past Medical History:  Diagnosis Date  . Erectile dysfunction   . Essential tremor   . Family history of breast cancer   . Family history of prostate cancer   . Family history of stomach cancer   . GERD (gastroesophageal reflux disease)   . History of colon polyps   . Hypercholesterolemia   . Hypertension   . Nocturia   . Prostate cancer [facility]) urologist-- dr Tresa Moore /  oncologist-- dr manning   prostate bx 06-24-2016 and 09-15-2017 at dr Tresa Moore office  Stage T1c, Gleason 4+3, PSA 8.48, vol 33cc---  planned external beam radiation therpay  . Urgency of urination   . Wears glasses     Past Surgical History:  Procedure Laterality Date  . COLONOSCOPY  last one 06/ 2018  . GOLD SEED IMPLANT N/A 12/23/2017   Procedure: GOLD SEED IMPLANT;  Surgeon: Alexis Frock, MD;  Location: [facility];  Service: Urology;  Laterality: N/A;  . LEFT HEART CATH AND CORONARY ANGIOGRAPHY N/A 05/12/2020   Procedure: LEFT HEART CATH AND CORONARY ANGIOGRAPHY;  Surgeon: Burnell Blanks, MD;  Location: [place] [facility];  Service: Cardiovascular;  Laterality: N/A;  . PROSTATE BIOPSY  06-24-2016;  09-15-2017-- at dr Tresa Moore office  . SPACE OAR INSTILLATION N/A 12/23/2017   Procedure: SPACE OAR INSTILLATION;  Surgeon:  Alexis Frock, MD;  Location: [facility];  Service: Urology;  Laterality: N/A;    There were no vitals filed for this visit.   Subjective Assessment - 06/20/20 1509    Subjective Feeling alot better today.  Stomach only hurts a little.  Have been doing 5 min walks in house 3 times per day and doesn't feel that fatigued. Starting to get his appetite back    Pertinent History Pt had prostate Cancer with seeding in 11/2017.  They had to stop the treatment because of problems with liver, pancreas and small intestine (METS)Treatment is on hold now for a couple of months to help him try and get stronger.    Currently in Pain? Yes    Pain Score 2     Pain Location Other (Comment)   stomach   Pain Descriptors / Indicators Aching    Pain Type Chronic pain                             [facility] Adult PT Treatment/Exercise - 06/20/20 0001      Bed Mobility   Bed Mobility Supine to Sit;Sit to Supine      Transfers   Five time sit to stand comments  --   slow and fatigued; less than 12 in 30 sec is risk for falls  Lumbar Exercises: Aerobic   Stationary Bike 5 min      Lumbar Exercises: Supine   Pelvic Tilt 10 reps    Clam 10 reps    Clam Limitations --   with ppt   Bent Knee Raise 10 reps    Bridge 10 reps    Bridge Limitations --   decreased height     Knee/Hip Exercises: Standing   Heel Raises Both;2 sets;10 reps    Hip Flexion AROM;Both;1 set;10 reps    Terminal Knee Extension Right;Left;1 set;10 reps    Hip Abduction AROM;1 set;10 reps    Abduction Limitations --   VCs for form   Hip Extension AROM;1 set;10 reps    Lateral Step Up Right;Left;1 set;10 reps   on ax, CGA   Forward Step Up 1 set;10 reps   on ax;CGA   Functional Squat 1 set;10 reps   mini squat  HHA on bike   Other Standing Knee Exercises marching on ax   HHA on bike     Shoulder Exercises: Standing   Other Standing Exercises 3 D shoulder raises x 10   back to wall, activate core    Other Standing Exercises Elbow flex and ext x 10   back against wall, activate core                   PT Short Term Goals - 06/18/20 1918      PT SHORT TERM GOAL #1   Title Pt. will be independent in a HEP for generalized strengthening, balance and endurance training    Time 3    Period Weeks    Status New    Target Date 07/09/20             PT Long Term Goals - 06/18/20 1923      PT LONG TERM GOAL #1   Title Pt will be able to complete TUG in 12 sec or less for decreased risk of falls    Baseline 14    Time 4    Period Weeks    Status New    Target Date 07/16/20      PT LONG TERM GOAL #2   Title Pt will be able to perform 12 sit to stands in 30 second sit to stand test for decreased risk of falls    Baseline 5    Time 5    Period Weeks    Status New    Target Date 07/23/20      PT LONG TERM GOAL #3   Title --    Baseline 100 yd    Time 5    Period Weeks    Status New    Target Date 07/23/20      PT LONG TERM GOAL #4   Title Pt will have bilateral LE strength 5/5    Time 6    Period Weeks    Status New    Target Date 07/30/20                 Plan - 06/20/20 1618    Clinical Impression Statement Pt did very well with exercises in clinic today.  He was mildly SOB after riding bike but recovered quickly.  His left leg felt more fatigued than his right.  Hip abd. fatigued with standing 3 D hip, and shoulders very fatigued with standing 3 D shoulder raises. He required VC's    Comorbidities Prior prostate Cancer, neuroendocrine tumor with METs, Prior CVA  Examination-Activity Limitations Locomotion Level;Stairs;Transfers;Stand    Examination-Participation Restrictions Community Activity;Yard Work;Other    Stability/Clinical Decision Making Stable/Uncomplicated    Rehab Potential Good    PT Frequency 2x / week    PT Duration 6 weeks    PT Treatment/Interventions Balance training;Therapeutic exercise;Therapeutic activities;Gait  training;Patient/family education;Energy conservation    PT Next Visit Plan See how pt felt after last visit. Continue generalized strength and endurance    PT Home Exercise Plan [place] 7 min 3 x/day           Patient will benefit from skilled therapeutic intervention in order to improve the following deficits and impairments:  Decreased activity tolerance, Decreased endurance, Decreased strength, Impaired perceived functional ability, Difficulty walking, Dizziness  Visit Diagnosis: Muscle weakness (generalized)  Difficulty in walking, not elsewhere classified     Problem List Patient Active Problem List   Diagnosis Date Noted  . ST elevation myocardial infarction (STEMI) ([place])   . Acute idiopathic pericarditis   . Chest pain 05/12/2020  . Chest pain of uncertain etiology   . 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 Goals of care, counseling/discussion 05/01/2020  . Genetic testing 11/13/2017  . Family history of prostate cancer   . Family history of breast cancer   . Family history of stomach cancer   . Malignant neoplasm of prostate ([place]) 08/31/2017    Claris Pong, PT 06/20/2020, 4:30 PM  [place], [place], [address] Phone: [phone]   Fax:  [phone]  Name: SAHEJ SCHRIEBER MRN: [id number] Date of Birth: 06-21-1946

## 2020-06-20 NOTE — Patient Instructions (Signed)
Since pt isn't feeling fatigue with 3 (5) minute walks per day will increase to 7 min walks

## 2020-06-25 ENCOUNTER — Ambulatory Visit: Payer: Medicare HMO

## 2020-06-25 ENCOUNTER — Other Ambulatory Visit: Payer: Self-pay

## 2020-06-25 DIAGNOSIS — R262 Difficulty in walking, not elsewhere classified: Secondary | ICD-10-CM | POA: Diagnosis not present

## 2020-06-25 DIAGNOSIS — M6281 Muscle weakness (generalized): Secondary | ICD-10-CM | POA: Diagnosis not present

## 2020-06-25 NOTE — Therapy (Signed)
[place], [place], [address] Phone: [phone]   Fax:  [phone]  Physical Therapy Treatment  Patient Details  Name: Ryan Wilkerson MRN: [id number] Date of Birth: 1946-06-29 Referring Provider (PT): Cira Rue NP   Encounter Date: 06/25/2020   PT End of Session - 06/25/20 1351    Visit Number 3    Number of Visits 12    Date for PT Re-Evaluation 07/30/20    PT Start Time 1300    PT Stop Time 1346    PT Time Calculation (min) 46 min    Activity Tolerance Patient tolerated treatment well;No increased pain    Behavior During Therapy WFL for tasks assessed/performed           Past Medical History:  Diagnosis Date  . Erectile dysfunction   . Essential tremor   . Family history of breast cancer   . Family history of prostate cancer   . Family history of stomach cancer   . GERD (gastroesophageal reflux disease)   . History of colon polyps   . Hypercholesterolemia   . Hypertension   . Nocturia   . Prostate cancer [facility]) urologist-- dr Tresa Moore /  oncologist-- dr manning   prostate bx 06-24-2016 and 09-15-2017 at dr Tresa Moore office  Stage T1c, Gleason 4+3, PSA 8.48, vol 33cc---  planned external beam radiation therpay  . Urgency of urination   . Wears glasses     Past Surgical History:  Procedure Laterality Date  . COLONOSCOPY  last one 06/ 2018  . GOLD SEED IMPLANT N/A 12/23/2017   Procedure: GOLD SEED IMPLANT;  Surgeon: Alexis Frock, MD;  Location: [facility];  Service: Urology;  Laterality: N/A;  . LEFT HEART CATH AND CORONARY ANGIOGRAPHY N/A 05/12/2020   Procedure: LEFT HEART CATH AND CORONARY ANGIOGRAPHY;  Surgeon: Burnell Blanks, MD;  Location: [place] [facility];  Service: Cardiovascular;  Laterality: N/A;  . PROSTATE BIOPSY  06-24-2016;  09-15-2017-- at dr Tresa Moore office  . SPACE OAR INSTILLATION N/A 12/23/2017   Procedure: SPACE OAR INSTILLATION;  Surgeon:  Alexis Frock, MD;  Location: [facility];  Service: Urology;  Laterality: N/A;    There were no vitals filed for this visit.   Subjective Assessment - 06/25/20 1257    Subjective Did well after last visit.  A little fatigued after last visit.  I went home and rested and I was fine.  Stomach is fine for the last 2 days.  I adjusted my diet and I am leaving out raw vegetables and dairy. Able to walk 10 minutes at a time now.   Pertinent History Pt had prostate Cancer with seeding in 11/2017.  They had to stop the treatment because of problems with liver, pancreas and small intestine (METS)Treatment is on hold now for a couple of months to help him try and get stronger.    How long can you sit comfortably? unlimited    How long can you stand comfortably? 3-5 min;  gets a little dizzy sometimes    How long can you walk comfortably? 100 yds before he has to rest;Gets hot flashes but don't last long but usually happen when in standing position    Currently in Pain? No/denies    Pain Score 0-No pain                             [facility] Adult PT Treatment/Exercise -  06/25/20 0001      Lumbar Exercises: Aerobic   Stationary Bike 5 min      Lumbar Exercises: Supine   Pelvic Tilt 10 reps    Clam 10 reps   with ppt   Bent Knee Raise 10 reps    Bridge 10 reps    Bridge Limitations --   decreased height     Knee/Hip Exercises: Standing   Heel Raises Both;2 sets;10 reps    Hip Flexion AROM;Both;1 set;10 reps    Terminal Knee Extension Right;Left;1 set;10 reps    Hip Abduction AROM;1 set;10 reps    Hip Extension AROM;1 set;10 reps    Lateral Step Up Right;Left;1 set;10 reps   on ax, CGA   Forward Step Up 1 set;10 reps   on ax;CGA   Functional Squat 15 reps    Functional Squat Limitations --   needs  v c   Other Standing Knee Exercises marching on ax   HHA on bike     Shoulder Exercises: Standing   Other Standing Exercises 3 D shoulder raises x 10   back to  wall, activate core   Other Standing Exercises Elbow flex and ext  2x 10   back against wall, activate core     Ankle Exercises: Standing   Other Standing Ankle Exercises standing gastroc stretch 2 x 20 sec B                    PT Short Term Goals - 06/18/20 1918      PT SHORT TERM GOAL #1   Title Pt. will be independent in a HEP for generalized strengthening, balance and endurance training    Time 3    Period Weeks    Status New    Target Date 07/09/20             PT Long Term Goals - 06/18/20 1923      PT LONG TERM GOAL #1   Title Pt will be able to complete TUG in 12 sec or less for decreased risk of falls    Baseline 14    Time 4    Period Weeks    Status New    Target Date 07/16/20      PT LONG TERM GOAL #2   Title Pt will be able to perform 12 sit to stands in 30 second sit to stand test for decreased risk of falls    Baseline 5    Time 5    Period Weeks    Status New    Target Date 07/23/20      PT LONG TERM GOAL #3   Title --    Baseline 100 yd    Time 5    Period Weeks    Status New    Target Date 07/23/20      PT LONG TERM GOAL #4   Title Pt will have bilateral LE strength 5/5    Time 6    Period Weeks    Status New    Target Date 07/30/20                 Plan - 06/25/20 1352    Clinical Impression Statement Pt did very well with mild fatigue.  Still fatigues most easily and requires rest after bike.  Hip abd still fatigue with standing exs.  Required demonstration and VCs for proper  mini squat technique.    Personal Factors and Comorbidities Age;Comorbidity 1;Comorbidity 2  Comorbidities Prior prostate Cancer, neuroendocrine tumor with METs, Prior CVA    Examination-Activity Limitations Locomotion Level;Stairs;Transfers;Stand    Examination-Participation Restrictions Community Activity;Yard Work;Other    Stability/Clinical Decision Making Stable/Uncomplicated    Rehab Potential Good    PT Frequency 2x / week    PT  Duration 6 weeks    PT Treatment/Interventions Balance training;Therapeutic exercise;Therapeutic activities;Gait training;Patient/family education;Energy conservation    PT Next Visit Plan See how pt felt after last visit. Continue generalized strength and endurance    PT Home Exercise Plan walk 10 min at a time 1-2 xs per day           Patient will benefit from skilled therapeutic intervention in order to improve the following deficits and impairments:  Decreased activity tolerance, Decreased endurance, Decreased strength, Impaired perceived functional ability, Difficulty walking, Dizziness  Visit Diagnosis: Muscle weakness (generalized)  Difficulty in walking, not elsewhere classified     Problem List Patient Active Problem List   Diagnosis Date Noted  . ST elevation myocardial infarction (STEMI) ([place])   . Acute idiopathic pericarditis   . Chest pain 05/12/2020  . Chest pain of uncertain etiology   . 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 Goals of care, counseling/discussion 05/01/2020  . Genetic testing 11/13/2017  . Family history of prostate cancer   . Family history of breast cancer   . Family history of stomach cancer   . Malignant neoplasm of prostate ([place]) 08/31/2017    Claris Pong, PT 06/25/2020, 1:55 PM  [place], [place], [address] Phone: [phone]   Fax:  [phone]  Name: Ryan Wilkerson MRN: [id number] Date of Birth: 1946-08-27

## 2020-06-25 NOTE — Patient Instructions (Signed)
Access Code: [email or web address] URL: [email or web address] Date: 06/25/2020 Prepared by: Cheral Almas  Exercises Supine Posterior Pelvic Tilt - 1 x daily - 7 x weekly - 3 sets - 10 reps Supine March with Posterior Pelvic Tilt - 1 x daily - 7 x weekly - 3 sets - 10 reps Supine Bridge - 1 x daily - 7 x weekly - 3 sets - 10 reps Standing Shoulder Flexion to 90 Degrees - 1 x daily - 7 x weekly - 3 sets - 10 reps Standing Shoulder Scaption - 1 x daily - 7 x weekly - 3 sets - 10 reps Heel rises with counter support - 1 x daily - 7 x weekly - 3 sets - 10 reps Standing Gastroc Stretch - 1 x daily - 7 x weekly - 3 sets - 10 reps Standing Gastroc Stretch - 1 x daily - 7 x weekly - 3 sets - 10 reps

## 2020-06-28 ENCOUNTER — Ambulatory Visit: Payer: Medicare HMO

## 2020-07-02 ENCOUNTER — Telehealth: Payer: Self-pay | Admitting: Gastroenterology

## 2020-07-02 DIAGNOSIS — R42 Dizziness and giddiness: Secondary | ICD-10-CM | POA: Diagnosis not present

## 2020-07-02 DIAGNOSIS — R490 Dysphonia: Secondary | ICD-10-CM | POA: Diagnosis not present

## 2020-07-02 DIAGNOSIS — R1310 Dysphagia, unspecified: Secondary | ICD-10-CM | POA: Diagnosis not present

## 2020-07-02 DIAGNOSIS — J342 Deviated nasal septum: Secondary | ICD-10-CM | POA: Diagnosis not present

## 2020-07-02 DIAGNOSIS — H6981 Other specified disorders of Eustachian tube, right ear: Secondary | ICD-10-CM | POA: Diagnosis not present

## 2020-07-02 NOTE — Telephone Encounter (Signed)
Hi Dr. Fuller Plan, we have received a referral from the [facility] for pt to have a repeat colon. Patient had colon in 2018 at [facility]. Records have been received and they will be sent to you for review. Please advise on scheduling. Thank you.

## 2020-07-03 ENCOUNTER — Ambulatory Visit: Payer: Medicare HMO | Attending: Nurse Practitioner

## 2020-07-03 ENCOUNTER — Other Ambulatory Visit: Payer: Self-pay

## 2020-07-03 DIAGNOSIS — M6281 Muscle weakness (generalized): Secondary | ICD-10-CM | POA: Insufficient documentation

## 2020-07-03 DIAGNOSIS — R262 Difficulty in walking, not elsewhere classified: Secondary | ICD-10-CM | POA: Insufficient documentation

## 2020-07-03 NOTE — Therapy (Signed)
[place], [place], [address] Phone: [phone]   Fax:  [phone]  Physical Therapy Treatment  Patient Details  Name: Ryan Wilkerson MRN: [id number] Date of Birth: Sep 26, 1945 Referring Provider (PT): Cira Rue NP   Encounter Date: 07/03/2020   PT End of Session - 07/03/20 1319    Visit Number 4    Number of Visits 12    Date for PT Re-Evaluation 07/30/20    PT Start Time 1300    PT Stop Time 6222    PT Time Calculation (min) 48 min    Activity Tolerance Patient tolerated treatment well;No increased pain    Behavior During Therapy WFL for tasks assessed/performed           Past Medical History:  Diagnosis Date  . Erectile dysfunction   . Essential tremor   . Family history of breast cancer   . Family history of prostate cancer   . Family history of stomach cancer   . GERD (gastroesophageal reflux disease)   . History of colon polyps   . Hypercholesterolemia   . Hypertension   . Nocturia   . Prostate cancer [facility]) urologist-- dr Tresa Moore /  oncologist-- dr manning   prostate bx 06-24-2016 and 09-15-2017 at dr Tresa Moore office  Stage T1c, Gleason 4+3, PSA 8.48, vol 33cc---  planned external beam radiation therpay  . Urgency of urination   . Wears glasses     Past Surgical History:  Procedure Laterality Date  . COLONOSCOPY  last one 06/ 2018  . GOLD SEED IMPLANT N/A 12/23/2017   Procedure: GOLD SEED IMPLANT;  Surgeon: Alexis Frock, MD;  Location: [facility];  Service: Urology;  Laterality: N/A;  . LEFT HEART CATH AND CORONARY ANGIOGRAPHY N/A 05/12/2020   Procedure: LEFT HEART CATH AND CORONARY ANGIOGRAPHY;  Surgeon: Burnell Blanks, MD;  Location: [place] [facility];  Service: Cardiovascular;  Laterality: N/A;  . PROSTATE BIOPSY  06-24-2016;  09-15-2017-- at dr Tresa Moore office  . SPACE OAR INSTILLATION N/A 12/23/2017   Procedure: SPACE OAR INSTILLATION;  Surgeon:  Alexis Frock, MD;  Location: [facility];  Service: Urology;  Laterality: N/A;    There were no vitals filed for this visit.   Subjective Assessment - 07/03/20 1256    Subjective Feeling pretty good today.  Rearranged some medications and I feel better. No present pain.  Tried some of the exercises and the next day my back felt stiff.  Walking 10 min 2-3 times per day and that feels good.    Pertinent History Pt had prostate Cancer with seeding in 11/2017.  They had to stop the treatment because of problems with liver, pancreas and small intestine (METS)Treatment is on hold now for a couple of months to help him try and get stronger. Still can't tell that endurance is any better.  Have gained a couple of pounds now.    How long can you stand comfortably? 3-5 min;  gets a little dizzy sometimes    Patient Stated Goals to turn wt loss around, be able to walk a little longer, have more endurance    Currently in Pain? No/denies    Pain Frequency Intermittent                             [facility] Adult PT Treatment/Exercise - 07/03/20 0001      Lumbar Exercises: Supine   Pelvic Tilt  10 reps    Clam 10 reps   with ppt and red TB   Bent Knee Raise 10 reps    Bridge 10 reps    Bridge Limitations --   decreased height     Knee/Hip Exercises: Standing   Heel Raises Both;2 sets;10 reps    Hip Flexion Right;Left;10 reps    Hip Abduction Stengthening;Right;Left;1 set;10 reps    Hip Extension AROM;1 set;Right;Left;Knee straight    Lateral Step Up Right;Left;1 set;10 reps   on ax, CGA   Forward Step Up 1 set;10 reps;Right;Left   on ax;CGA   Functional Squat 15 reps    Functional Squat Limitations needs VC's for correct form   needs  v c   Other Standing Knee Exercises marching on ax x 20   HHA on bike   Other Standing Knee Exercises sit to stand from chair 2 x5   arms crossed     Shoulder Exercises: Standing   Row Both;10 reps    Theraband Level (Shoulder Row)  Level 1 (Yellow)    Retraction Both;10 reps    Theraband Level (Shoulder Retraction) Level 1 (Yellow)   with extension   Other Standing Exercises 3 D shoulder raises x 10   back to wall, activate core   Other Standing Exercises Elbow flex and ext  2x 10 2#   back against wall, activate core     Ankle Exercises: Standing   Heel Raises 20 reps    Other Standing Ankle Exercises standing gastroc stretch 2 x 20 sec B                    PT Short Term Goals - 06/18/20 1918      PT SHORT TERM GOAL #1   Title Pt. will be independent in a HEP for generalized strengthening, balance and endurance training    Time 3    Period Weeks    Status New    Target Date 07/09/20             PT Long Term Goals - 06/18/20 1923      PT LONG TERM GOAL #1   Title Pt will be able to complete TUG in 12 sec or less for decreased risk of falls    Baseline 14    Time 4    Period Weeks    Status New    Target Date 07/16/20      PT LONG TERM GOAL #2   Title Pt will be able to perform 12 sit to stands in 30 second sit to stand test for decreased risk of falls    Baseline 5    Time 5    Period Weeks    Status New    Target Date 07/23/20      PT LONG TERM GOAL #3   Title --    Baseline 100 yd    Time 5    Period Weeks    Status New    Target Date 07/23/20      PT LONG TERM GOAL #4   Title Pt will have bilateral LE strength 5/5    Time 6    Period Weeks    Status New    Target Date 07/30/20                 Plan - 07/03/20 1348    Clinical Impression Statement Pt didd very well but had slight SOB with arm raises and more fatigue with arm  raises than with leg exercises.  Continues to improve with balance activities and used good form with LB exs with occasional VC's for proper ppt.    Personal Factors and Comorbidities Age;Comorbidity 1;Comorbidity 2    Comorbidities Prior prostate Cancer, neuroendocrine tumor with METs, Prior CVA    Examination-Activity Limitations  Locomotion Level;Stairs;Transfers;Stand    Examination-Participation Restrictions Community Activity;Yard Work;Other    Stability/Clinical Decision Making Stable/Uncomplicated    Rehab Potential Good    PT Frequency 2x / week    PT Duration 6 weeks    PT Treatment/Interventions Balance training;Therapeutic exercise;Therapeutic activities;Gait training;Patient/family education;Energy conservation    PT Next Visit Plan Cont generalized strength and endurance    PT Home Exercise Plan walk 10 min at a time 2-3 x's/day, HEP as instructed    Consulted and Agree with Plan of Care Patient           Patient will benefit from skilled therapeutic intervention in order to improve the following deficits and impairments:  Decreased activity tolerance, Decreased endurance, Decreased strength, Impaired perceived functional ability, Difficulty walking, Dizziness  Visit Diagnosis: Muscle weakness (generalized)  Difficulty in walking, not elsewhere classified     Problem List Patient Active Problem List   Diagnosis Date Noted  . ST elevation myocardial infarction (STEMI) ([place])   . Acute idiopathic pericarditis   . Chest pain 05/12/2020  . Chest pain of uncertain etiology   . 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 Goals of care, counseling/discussion 05/01/2020  . Genetic testing 11/13/2017  . Family history of prostate cancer   . Family history of breast cancer   . Family history of stomach cancer   . Malignant neoplasm of prostate ([place]) 08/31/2017    Claris Pong, PT 07/03/2020, 1:56 PM  [place], [place], [address] Phone: [phone]   Fax:  [phone]  Name: Ryan Wilkerson MRN: [id number] Date of Birth: 04-15-1946

## 2020-07-04 NOTE — Progress Notes (Signed)
[name]   Telephone:([phone] [phone]   Clinic Follow up Note   Patient Care Team: Alroy Dust, L.Marlou Sa, MD as PCP - General (Family Medicine) Burnell Blanks, MD as PCP - Cardiology (Cardiology) Jonnie Finner, RN as Oncology Nurse Navigator Truitt Merle, MD as Consulting Physician (Oncology) Alla Feeling, NP as Nurse Practitioner (Nurse Practitioner) Alexis Frock, MD as Consulting Physician (Urology) Tyler Pita, MD as Consulting Physician (Radiation Oncology)  Date of Service:  07/05/2020  CHIEF COMPLAINT: Follow-up metastatic neuroendocrine tumor  SUMMARY OF ONCOLOGIC HISTORY: Oncology History Overview Note  Cancer Staging No matching staging information was found for the patient.    Malignant neoplasm of prostate ([place])  08/31/2017 Initial Diagnosis   Malignant neoplasm of prostate ([place])   10/28/2017 Genetic Testing   The patient had genetic testing due to a personal history of prostate cancer and a family history of breast and stomach cancer.  The Common Hereditary Cancers Panel + Prostate Cancer Panel was ordered.  APC, ATM, AXIN2, BARD1, BMPR1A, BRCA1, BRCA2, BRIP1, CDH1, CDK4, CDKN2A (p14ARF), CDKN2A (p16INK4a), CHEK2, CTNNA1, DICER1, EPCAM*, FANCA, GREM1*, KIT, MEN1, MLH1, MSH2, MSH3, MSH6, MUTYH, NBN, NF1, PALB2, PDGFRA, PMS2, POLD1, POLE, PTEN, RAD50, RAD51C, RAD51D, SDHB, SDHC, SDHD, SMAD4, SMARCA4, STK11, TP53, TSC1, TSC2, VHL. The following genes were evaluated for sequence changes only: HOXB13*, NTHL1*, SDHA.   Results: Negative, no pathogenic variants identified. The date of this test report is 10/28/2017.    01/05/2018 - 02/12/2018 Radiation Therapy   S/p radiation to the prostate 70 Gy in 28 fractions from 01/05/2018-02/12/2018 per Dr. 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 2.5 x 2.0 x 3.0 cm soft tissue lesion in the central small bowel mesentery with  associated dystrophic calcification and retraction of adjacent small bowel loops features highly suspicious for metastatic carcinoid tumor.  Upper normal 9 mm short axis lymph node in the adjacent mesentery 2. 2.  1.6 x 1.4 cm heterogeneous, poorly defined lesion in the inferior right liver suspicious for metastatic disease, the differential includes GI primary or metastatic prostate cancer 3. No retroperitoneal or pelvic sidewall lymphadenopathy   04/01/2020 PET scan   IMPRESSION: 1. Central mesenteric mass with intense radiotracer activity consistent well differentiated neuroendocrine tumor 2. 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 3. Small peritoneal/nodal metastasis along the LEFT iliac vessels. 4. Very small lesion in the pancreas with intermediate activity could represent primary lesion. No primary bowel lesion identified. 5. Small metastatic implant within the pericardium adjacent LEFT heart ventricle. 6. No skeletal metastasis.   04/30/2020 Pathology Results   FINAL MICROSCOPIC DIAGNOSIS:  A. 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  Tumor cells are negative  for TTF-1.  The findings are consistent with neuroendocrine tumor of  gastrointestinal origin.  Ki-67 stain shows a proliferative index of  about 1%, consistent with well-differentiated, grade 1 tumor.    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 Held after cycle 1 due to suspected pericarditis and deconditioning.        CURRENT THERAPY:  First-line octreotide (Sandostatin) monthly starting 05/10/20. Held after cycle 1 due to suspected pericarditis and deconditioning.   INTERVAL  HISTORY:  Ryan Wilkerson  is here for a follow up. He presents to the clinic alone.  Doing well overall, main complain is moderate fatigue, able to function well at home, but does things slower than before  Gastric pain resolved after taking protonix before meal BM once every 3-4 days, no constipated as before, loose soft, no blood or black stool  He is doing outpt PT twice a week for past 3 weeks   He has flush 2-3 times a week, lasts a few seconds  Gained a few lbs   All other systems were reviewed with the patient and are negative.  MEDICAL HISTORY:  Past Medical History:  Diagnosis Date  . Erectile dysfunction   . Essential tremor   . Family history of breast cancer   . Family history of prostate cancer   . Family history of stomach cancer   . GERD (gastroesophageal reflux disease)   . History of colon polyps   . Hypercholesterolemia   . Hypertension   . Nocturia   . Prostate cancer [facility]) urologist-- dr Tresa Moore /  oncologist-- dr manning   prostate bx 06-24-2016 and 09-15-2017 at dr Tresa Moore office  Stage T1c, Gleason 4+3, PSA 8.48, vol 33cc---  planned external beam radiation therpay  . Urgency of urination   . Wears glasses     SURGICAL HISTORY: Past Surgical History:  Procedure Laterality Date  . COLONOSCOPY  last one 06/ 2018  . GOLD SEED IMPLANT N/A 12/23/2017   Procedure: GOLD SEED IMPLANT;  Surgeon: Alexis Frock, MD;  Location: [facility];  Service: Urology;  Laterality: N/A;  . LEFT HEART CATH AND CORONARY ANGIOGRAPHY N/A 05/12/2020   Procedure: LEFT HEART CATH AND CORONARY ANGIOGRAPHY;  Surgeon: Burnell Blanks, MD;  Location: [place] [facility];  Service: Cardiovascular;  Laterality: N/A;  . PROSTATE BIOPSY  06-24-2016;  09-15-2017-- at dr Tresa Moore office  . SPACE OAR INSTILLATION N/A 12/23/2017   Procedure: SPACE OAR INSTILLATION;  Surgeon: Alexis Frock, MD;  Location: [facility];  Service: Urology;  Laterality: N/A;    I  have reviewed the social history and family history with the patient and they are unchanged from previous note.  ALLERGIES:  is allergic to aspirin and simvastatin.  MEDICATIONS:  Current Outpatient Medications  Medication Sig Dispense Refill  . acetaminophen (TYLENOL) 500 MG tablet Take 1,000 mg by mouth every 6 (six) hours as needed for headache (pain).     Marland Kitchen clopidogrel (PLAVIX) 75 MG tablet Take 75 mg by mouth daily.     . Colchicine (MITIGARE) 0.6 MG CAPS Take 0.6 mg by mouth 2 (two) times daily. 60 capsule 1  . diazepam (VALIUM) 5 MG tablet Take 5 mg by mouth every 8 (eight) hours as needed (dizziness).     . fluticasone (FLONASE) 50 MCG/ACT nasal spray Place 2 sprays into both nostrils daily. 16 g 0  . ibuprofen (ADVIL) 400 MG tablet Take 1 tablet (400 mg total) by mouth 2 (two) times daily. (Patient not taking: Reported on 06/18/2020) 10 tablet 0  . levETIRAcetam (KEPPRA) 250 MG tablet Take 375 mg by mouth 3 (three) times daily. Take 125 mg( 1/2 tab) po TID x 3 weeks, increase to 250 mg(1 tab) po TID for 3 weeks, increase to 375 mg TID     . lisinopril-hydrochlorothiazide (PRINZIDE,ZESTORETIC) 20-12.5 MG tablet Take 0.5 tablets by mouth every morning.     . loratadine (CLARITIN) 10 MG tablet Take 10 mg by mouth every morning.     Marland Kitchen  Melatonin 5 MG TABS Take 5 mg by mouth at bedtime.  (Patient not taking: Reported on 06/18/2020)    . mirtazapine (REMERON) 7.5 MG tablet Take 1 tablet (7.5 mg total) by mouth at bedtime. 30 tablet 3  . OVER THE COUNTER MEDICATION Place 1 drop into both eyes daily as needed (dry eyes). Over the counter lubricating eye drop     . pantoprazole (PROTONIX) 40 MG tablet Take 1 tablet (40 mg total) by mouth daily. 30 tablet 0  . pravastatin (PRAVACHOL) 40 MG tablet Take 40 mg by mouth every morning.     . SYMBICORT 160-4.5 MCG/ACT inhaler Inhale 1-2 puffs into the lungs as needed.    . tamsulosin (FLOMAX) 0.4 MG CAPS capsule Take 0.4 mg by mouth at bedtime.     .  traMADol (ULTRAM) 50 MG tablet Take 50-100 mg by mouth every 6 (six) hours as needed for moderate pain or severe pain.      No current facility-administered medications for this visit.    PHYSICAL EXAMINATION: ECOG PERFORMANCE STATUS: 2 - Symptomatic, <50% confined to bed  Vitals:   07/05/20 1104  BP: 137/68  Pulse: 62  Resp: 20  Temp: 97.8 F (36.6 C)  SpO2: 100%   Filed Weights   07/05/20 1104  Weight: 148 lb 9.6 oz (67.4 kg)    GENERAL:alert, no distress and comfortable SKIN: skin color, texture, turgor are normal, no rashes or significant lesions EYES: normal, Conjunctiva are pink and non-injected, sclera clear NECK: supple, thyroid normal size, non-tender, without nodularity LYMPH:  no palpable lymphadenopathy in the cervical, axillary  LUNGS: clear to auscultation and percussion with normal breathing effort HEART: regular rate & rhythm and no murmurs and no lower extremity edema ABDOMEN:abdomen soft, non-tender and normal bowel sounds Musculoskeletal:no cyanosis of digits and no clubbing  NEURO: alert & oriented x 3 with fluent speech, no focal motor/sensory deficits  LABORATORY DATA:  I have reviewed the data as listed CBC Latest Ref Rng & Units 06/06/2020 05/21/2020 05/13/2020  WBC 4.0 - 10.5 K/uL 7.1 7.8 12.2(H)  Hemoglobin 13.0 - 17.0 g/dL 12.5(L) 11.1(L) 10.3(L)  Hematocrit 39 - 52 % 39.0 34.7(L) 32.8(L)  Platelets 150 - 400 K/uL 262 386 189     CMP Latest Ref Rng & Units 06/06/2020 05/21/2020 05/13/2020  Glucose 70 - 99 mg/dL 97 137(H) 116(H)  BUN 8 - 23 mg/dL _0 Creatinine 0.61 - 1.24 mg/dL 1.25(H) 1.24 1.35(H)  Sodium 135 - 145 mmol/L 137 136 132(L)  Potassium 3.5 - 5.1 mmol/L 3.5 3.8 3.7  Chloride 98 - 111 mmol/L 102 99 101  CO2 22 - 32 mmol/L 29 29 21(L)  Calcium 8.9 - 10.3 mg/dL 9.5 9.4 8.5(L)  Total Protein 6.5 - 8.1 g/dL 7.6 7.9 -  Total Bilirubin 0.3 - 1.2 mg/dL 0.7 0.6 -  Alkaline Phos 38 - 126 U/L 80 99 -  AST 15 - 41 U/L 26 43(H) -  ALT 0  - 44 U/L 47(H) 49(H) -      RADIOGRAPHIC STUDIES: I have personally reviewed the radiological images as listed and agreed with the findings in the report. No results found.   ASSESSMENT & PLAN:  Ryan Wilkerson is a 74 y.o. male with    1.Well differentiated/G1 neuroendocrine tumor, consistent with GI primary with liver and nodal metastasis -He was diagnosed in 04/2020. He was found to have a mesenteric mass and single liver lesion during work up for his prostate cancer. 04/30/20 Liver  biopsy showed Metastatic well-differentiated neuroendocrine tumor to the liver consistent with GI Primary. PET showed 3 liver masses and small peritoneal/nodal metastatic deposits along the left iliac vessels. No primary bowel lesion identified. -I previously reviewed he has metastatic disease and surgical resection is not an option. His cancer is not curable but still treatable.  -I started him on treatment with first line Sandostatin 20 mg on 05/10/20. Goal is palliative/disease control.  -Treatment held since 05/12/20 due to hospitalization for suspected Pericarditis and slow recovery.  -he is overall doing better, but he still not ready to restart Sandostatin injection.  We discussed is okay to hold on treatment given his minimal symptoms, the indolent nature of his tumor. -I will see him back in 2 months with a repeated dotatate PET scan for disease monitoring -I will change Sandostatin to Lanreotide when he restarts     2. Suspected pericarditis  -Received first octreotide on 05/10/20, on 9/11 he developed acute chest pain, nausea, diaphoresis hoarseness -ED 05/12/2020 found to have inferior STE, echo showed preserved LV function and small pericardial effusion. He was admitted and treated for suspected acute pericarditis with colchicine 0.6 p.o. twice daily x3 months and ibuprofen.  -He will continue to f/u with cardiologist.   3. 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 Tammi Klippel -PSA down to 1.8 on 08/2018, steady rise to 4.69 in 01/2020 -CT/bone scan in 6/21 showed no obvious metastatic disease from prostate cancerbut did reveal mesenteric mass and liver lesionultimately biopsy-proven neuroendocrine tumor -Followed by [facility]. [facility], further work-up/treatment pending.  5. Genetic Testing negative for pathogenetic mutations on 10/28/17 report.   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   Orders Placed This Encounter  Procedures  . NM PET (CU-64 DETECTNET)SKULL TO MID THIGH    Standing Status:   Future    Standing Expiration Date:   07/05/2021    Order Specific Question:   If indicated for the ordered procedure, I authorize the administration of a radiopharmaceutical per Radiology protocol    Answer:   Yes    Order Specific Question:   Preferred imaging location?    Answer:   [facility]   All questions were answered. The patient knows to call the clinic with any problems, questions or concerns. No barriers to  learning was detected. The total time spent in the appointment was 30 minutes.     Truitt Merle, MD 07/05/2020   I, Joslyn Devon, am acting as scribe for Truitt Merle, MD.   I have reviewed the above documentation for accuracy and completeness, and I agree with the above.

## 2020-07-04 NOTE — Telephone Encounter (Signed)
Dr. Lynne Leader revewed records and advised for pt to follow up with Dr. Cristina Gong since he is established as a pt there and had his colon procedure in 10/2016. Pt has been informed of this and a notification was faxed to the [place].

## 2020-07-05 ENCOUNTER — Encounter: Payer: Self-pay | Admitting: Hematology

## 2020-07-05 ENCOUNTER — Inpatient Hospital Stay: Payer: Medicare HMO | Attending: Nurse Practitioner | Admitting: Hematology

## 2020-07-05 ENCOUNTER — Ambulatory Visit: Payer: Medicare HMO

## 2020-07-05 ENCOUNTER — Ambulatory Visit: Payer: No Typology Code available for payment source

## 2020-07-05 ENCOUNTER — Other Ambulatory Visit: Payer: Self-pay

## 2020-07-05 VITALS — BP 137/68 | HR 62 | Temp 97.8°F | Resp 20 | Ht 68.0 in | Wt 148.6 lb

## 2020-07-05 DIAGNOSIS — C7B8 Other secondary neuroendocrine tumors: Secondary | ICD-10-CM | POA: Diagnosis not present

## 2020-07-05 DIAGNOSIS — C7B02 Secondary carcinoid tumors of liver: Secondary | ICD-10-CM | POA: Diagnosis not present

## 2020-07-05 DIAGNOSIS — M6281 Muscle weakness (generalized): Secondary | ICD-10-CM | POA: Diagnosis not present

## 2020-07-05 DIAGNOSIS — R262 Difficulty in walking, not elsewhere classified: Secondary | ICD-10-CM | POA: Diagnosis not present

## 2020-07-05 DIAGNOSIS — C7A00 Malignant carcinoid tumor of unspecified site: Secondary | ICD-10-CM | POA: Diagnosis not present

## 2020-07-05 MED ORDER — MIRTAZAPINE 7.5 MG PO TABS
7.5000 mg | ORAL_TABLET | Freq: Every day | ORAL | 3 refills | Status: DC
Start: 2020-07-05 — End: 2020-10-04

## 2020-07-05 NOTE — Therapy (Signed)
[facility] [place], [place], [address] Phone: [phone]   Fax:  [phone]  Physical Therapy Treatment  Patient Details  Name: Ryan Wilkerson MRN: [id number] Date of Birth: 27-Apr-1946 Referring Provider (PT): Cira Rue NP   Encounter Date: 07/05/2020   PT End of Session - 07/05/20 1347    Visit Number 5    Number of Visits 12    Date for PT Re-Evaluation 07/30/20    PT Start Time 9476    PT Stop Time 5465    PT Time Calculation (min) 50 min    Activity Tolerance Patient tolerated treatment well;No increased pain    Behavior During Therapy WFL for tasks assessed/performed           Past Medical History:  Diagnosis Date  . Erectile dysfunction   . Essential tremor   . Family history of breast cancer   . Family history of prostate cancer   . Family history of stomach cancer   . GERD (gastroesophageal reflux disease)   . History of colon polyps   . Hypercholesterolemia   . Hypertension   . Nocturia   . Prostate cancer [facility]) urologist-- dr Tresa Moore /  oncologist-- dr manning   prostate bx 06-24-2016 and 09-15-2017 at dr Tresa Moore office  Stage T1c, Gleason 4+3, PSA 8.48, vol 33cc---  planned external beam radiation therpay  . Urgency of urination   . Wears glasses     Past Surgical History:  Procedure Laterality Date  . COLONOSCOPY  last one 06/ 2018  . GOLD SEED IMPLANT N/A 12/23/2017   Procedure: GOLD SEED IMPLANT;  Surgeon: Alexis Frock, MD;  Location: [facility];  Service: Urology;  Laterality: N/A;  . LEFT HEART CATH AND CORONARY ANGIOGRAPHY N/A 05/12/2020   Procedure: LEFT HEART CATH AND CORONARY ANGIOGRAPHY;  Surgeon: Burnell Blanks, MD;  Location: [place] [facility];  Service: Cardiovascular;  Laterality: N/A;  . PROSTATE BIOPSY  06-24-2016;  09-15-2017-- at dr Tresa Moore office  . SPACE OAR INSTILLATION N/A 12/23/2017   Procedure: SPACE OAR INSTILLATION;  Surgeon:  Alexis Frock, MD;  Location: [facility];  Service: Urology;  Laterality: N/A;    There were no vitals filed for this visit.   Subjective Assessment - 07/05/20 1258    Subjective I walked outside for about 5 min yesterday.  Feeling pretty good.  Didn't feel too tired after last visit.  Went shopping at [employer] for about 45 min and I was ready to go  home.    Currently in Pain? No/denies                             [facility] Adult PT Treatment/Exercise - 07/05/20 0001      Lumbar Exercises: Aerobic   Recumbent Bike 5 min no resistance      Lumbar Exercises: Supine   Pelvic Tilt 10 reps    Clam 10 reps;15 reps   with ppt and red TB   Bent Knee Raise 10 reps    Bridge 10 reps    Bridge Limitations --   decreased height     Knee/Hip Exercises: Standing   Heel Raises Both;20 reps    Lateral Step Up Right;Left;1 set;15 reps   6 in step   Forward Step Up Right;Left;1 set;20 reps   6 in partial step ups   Wall Squat 5 reps;10 reps    SLS with Vectors  6 ea    Gait Training sidestepping  3 laps 6 steps    Other Standing Knee Exercises marching on ax x 30   HHA on bike   Other Standing Knee Exercises sit to stand from chair 3 x5   arms crossed     Shoulder Exercises: Standing   External Rotation Strengthening;Both;10 reps    Theraband Level (Shoulder External Rotation) Level 1 (Yellow)    Row Strengthening;Both;15 reps    Retraction Both;15 reps    Theraband Level (Shoulder Retraction) Level 1 (Yellow)   with extension   Other Standing Exercises 3 D shoulder raises  2x5   back to wall, activate core   Other Standing Exercises Elbow flex and ext  2x 10 3#   back against wall, activate core                   PT Short Term Goals - 06/18/20 1918      PT SHORT TERM GOAL #1   Title Pt. will be independent in a HEP for generalized strengthening, balance and endurance training    Time 3    Period Weeks    Status New    Target Date 07/09/20              PT Long Term Goals - 06/18/20 1923      PT LONG TERM GOAL #1   Title Pt will be able to complete TUG in 12 sec or less for decreased risk of falls    Baseline 14    Time 4    Period Weeks    Status New    Target Date 07/16/20      PT LONG TERM GOAL #2   Title Pt will be able to perform 12 sit to stands in 30 second sit to stand test for decreased risk of falls    Baseline 5    Time 5    Period Weeks    Status New    Target Date 07/23/20      PT LONG TERM GOAL #3   Title --    Baseline 100 yd    Time 5    Period Weeks    Status New    Target Date 07/23/20      PT LONG TERM GOAL #4   Title Pt will have bilateral LE strength 5/5    Time 6    Period Weeks    Status New    Target Date 07/30/20                 Plan - 07/05/20 1351    Clinical Impression Statement pt with excellent improvement today.  Not nearly as winded after riding the bike or with arm raises.  Was able to tolerate being in [place] for 45 min.  after having therapy last visit    Personal Factors and Comorbidities Age;Comorbidity 1;Comorbidity 2    Examination-Activity Limitations Locomotion Level;Stairs;Transfers;Stand    Examination-Participation Restrictions Community Activity;Yard Work;Other    Stability/Clinical Decision Making Stable/Uncomplicated    Rehab Potential Good    PT Frequency 2x / week    PT Duration 6 weeks    PT Treatment/Interventions Balance training;Therapeutic exercise;Therapeutic activities;Gait training;Patient/family education;Energy conservation    PT Home Exercise Plan walk 10 min at a time 2-3 x's/day, HEP as instructed    Consulted and Agree with Plan of Care Patient           Patient will benefit from skilled therapeutic intervention in  order to improve the following deficits and impairments:  Decreased activity tolerance, Decreased endurance, Decreased strength, Impaired perceived functional ability, Difficulty walking, Dizziness  Visit  Diagnosis: Muscle weakness (generalized)  Difficulty in walking, not elsewhere classified     Problem List Patient Active Problem List   Diagnosis Date Noted  . ST elevation myocardial infarction (STEMI) ([place])   . Acute idiopathic pericarditis   . Chest pain 05/12/2020  . Chest pain of uncertain etiology   . 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 Goals of care, counseling/discussion 05/01/2020  . Genetic testing 11/13/2017  . Family history of prostate cancer   . Family history of breast cancer   . Family history of stomach cancer   . Malignant neoplasm of prostate ([place]) 08/31/2017    Sherrin Daisy 07/05/2020, 1:59 PM  [facility] [place], [place], [address] Phone: [phone]   Fax:  [phone]  Name: CAROLE DEERE MRN: [id number] Date of Birth: 1946/08/26

## 2020-07-09 ENCOUNTER — Ambulatory Visit: Payer: Medicare HMO

## 2020-07-09 ENCOUNTER — Other Ambulatory Visit: Payer: Self-pay

## 2020-07-09 DIAGNOSIS — R262 Difficulty in walking, not elsewhere classified: Secondary | ICD-10-CM | POA: Diagnosis not present

## 2020-07-09 DIAGNOSIS — M6281 Muscle weakness (generalized): Secondary | ICD-10-CM

## 2020-07-09 NOTE — Therapy (Signed)
[place], [place], [address] Phone: [phone]   Fax:  [phone]  Physical Therapy Treatment  Patient Details  Name: Ryan Wilkerson MRN: [id number] Date of Birth: [birth year] Referring Provider (PT): Cira Rue NP   Encounter Date: 07/09/2020   PT End of Session - 07/09/20 1540    Visit Number 6    Number of Visits 12    Date for PT Re-Evaluation 07/30/20    PT Start Time 7782    PT Stop Time 4235    PT Time Calculation (min) 55 min    Activity Tolerance Patient tolerated treatment well;No increased pain    Behavior During Therapy WFL for tasks assessed/performed           Past Medical History:  Diagnosis Date  . Erectile dysfunction   . Essential tremor   . Family history of breast cancer   . Family history of prostate cancer   . Family history of stomach cancer   . GERD (gastroesophageal reflux disease)   . History of colon polyps   . Hypercholesterolemia   . Hypertension   . Nocturia   . Prostate cancer [facility]) urologist-- dr Tresa Moore /  oncologist-- dr manning   prostate bx 06-24-2016 and 09-15-2017 at dr Tresa Moore office  Stage T1c, Gleason 4+3, PSA 8.48, vol 33cc---  planned external beam radiation therpay  . Urgency of urination   . Wears glasses     Past Surgical History:  Procedure Laterality Date  . COLONOSCOPY  last one 06/ 2018  . GOLD SEED IMPLANT N/A 12/23/2017   Procedure: GOLD SEED IMPLANT;  Surgeon: Alexis Frock, MD;  Location: [facility];  Service: Urology;  Laterality: N/A;  . LEFT HEART CATH AND CORONARY ANGIOGRAPHY N/A 05/12/2020   Procedure: LEFT HEART CATH AND CORONARY ANGIOGRAPHY;  Surgeon: Burnell Blanks, MD;  Location: [place] [facility];  Service: Cardiovascular;  Laterality: N/A;  . PROSTATE BIOPSY  06-24-2016;  09-15-2017-- at dr Tresa Moore office  . SPACE OAR INSTILLATION N/A 12/23/2017   Procedure: SPACE OAR INSTILLATION;  Surgeon:  Alexis Frock, MD;  Location: [facility];  Service: Urology;  Laterality: N/A;    There were no vitals filed for this visit.   Subjective Assessment - 07/09/20 1303    Subjective I am up to 15 minutes of walking now 2-3 times/ day.Don't feel fatigued afterwards.  Seems like balance is getting better too. Stomach is doing pretty well today also.  Rode the bike yesterday for 15 min with a little resistance.    How long can you sit comfortably? unlimited    Currently in Pain? No/denies                             [facility] Adult PT Treatment/Exercise - 07/09/20 0001      Neuro Re-ed    Neuro Re-ed Details  cone taps x 10 ea leg      Lumbar Exercises: Supine   Clam 10 reps;15 reps   with ppt and red TB   Bent Knee Raise 10 reps   alternating   Bridge 10 reps    Bridge Limitations --   decreased height   Straight Leg Raise 10 reps   ea leg   Other Supine Lumbar Exercises hip ext arc x 10 sigle leg.      Knee/Hip Exercises: Standing   Heel Raises 2 sets;10 reps  on ax   Lateral Step Up Right;Left;2 sets;10 reps   8 in step up   Forward Step Up Left;Right;2 sets;10 reps   8 in partial step ups   Functional Squat 2 sets;10 reps    SLS cone touches x 10 ea leg   on chair   SLS with Vectors 6 ea    Other Standing Knee Exercises marching on ax x 30   HHA on bike   Other Standing Knee Exercises sit to stand from chair  2 x 10       arms crossed     Shoulder Exercises: Standing   External Rotation Strengthening;Both;10 reps    Theraband Level (Shoulder External Rotation) Level 1 (Yellow)    Row Strengthening;Both;15 reps    Theraband Level (Shoulder Row) Level 1 (Yellow)    Retraction Both;15 reps    Theraband Level (Shoulder Retraction) Level 1 (Yellow)   with extension   Other Standing Exercises 3 D shoulder 12 reps ea    Other Standing Exercises Elbow flex and ext  2x 10 3#   back against wall, activate core                   PT Short  Term Goals - 06/18/20 1918      PT SHORT TERM GOAL #1   Title Pt. will be independent in a HEP for generalized strengthening, balance and endurance training    Time 3    Period Weeks    Status New    Target Date 07/09/20             PT Long Term Goals - 06/18/20 1923      PT LONG TERM GOAL #1   Title Pt will be able to complete TUG in 12 sec or less for decreased risk of falls    Baseline 14    Time 4    Period Weeks    Status New    Target Date 07/16/20      PT LONG TERM GOAL #2   Title Pt will be able to perform 12 sit to stands in 30 second sit to stand test for decreased risk of falls    Baseline 5    Time 5    Period Weeks    Status New    Target Date 07/23/20      PT LONG TERM GOAL #3   Title --    Baseline 100 yd    Time 5    Period Weeks    Status New    Target Date 07/23/20      PT LONG TERM GOAL #4   Title Pt will have bilateral LE strength 5/5    Time 6    Period Weeks    Status New    Target Date 07/30/20                 Plan - 07/09/20 1541    Clinical Impression Statement Pt. performed higher level strenth and balance activities today.  Improved balance noted with higher level activities:ie cone taps.  Less hip abd fatigue noted with SL stance activities.  Still fatigues the most with standing 3 D shoulder raises.  Able to walk 15 min at a time now without undue fatigue.  Eating better and less stomach pain.  Pt is happy with his progress    Personal Factors and Comorbidities Age;Comorbidity 1;Comorbidity 2    Comorbidities Prior prostate Cancer, neuroendocrine tumor with METs, Prior CVA  Examination-Activity Limitations Locomotion Level;Stairs;Transfers;Stand    Stability/Clinical Decision Making Stable/Uncomplicated    Rehab Potential Good    PT Frequency 2x / week    PT Duration 6 weeks    PT Treatment/Interventions Balance training;Therapeutic exercise;Therapeutic activities;Gait training;Patient/family education;Energy conservation     PT Next Visit Plan Cont generalized strength and endurance    PT Home Exercise Plan increase walking time to 15 min 3xs per day, exs for HEP as instructed    Consulted and Agree with Plan of Care Patient           Patient will benefit from skilled therapeutic intervention in order to improve the following deficits and impairments:  Decreased activity tolerance, Decreased endurance, Decreased strength, Impaired perceived functional ability, Difficulty walking, Dizziness  Visit Diagnosis: Muscle weakness (generalized)  Difficulty in walking, not elsewhere classified     Problem List Patient Active Problem List   Diagnosis Date Noted  . ST elevation myocardial infarction (STEMI) ([place])   . Acute idiopathic pericarditis   . Chest pain 05/12/2020  . Chest pain of uncertain etiology   . 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 Goals of care, counseling/discussion 05/01/2020  . Genetic testing 11/13/2017  . Family history of prostate cancer   . Family history of breast cancer   . Family history of stomach cancer   . Malignant neoplasm of prostate ([place]) 08/31/2017    Claris Pong, PT 07/09/2020, 3:46 PM  [place], [place], [address] Phone: [phone]   Fax:  [phone]  Name: LATWAN LUCHSINGER MRN: [id number] Date of Birth: 1945/09/14

## 2020-07-11 ENCOUNTER — Other Ambulatory Visit: Payer: Self-pay

## 2020-07-11 ENCOUNTER — Ambulatory Visit: Payer: Medicare HMO

## 2020-07-11 DIAGNOSIS — R262 Difficulty in walking, not elsewhere classified: Secondary | ICD-10-CM | POA: Diagnosis not present

## 2020-07-11 DIAGNOSIS — M6281 Muscle weakness (generalized): Secondary | ICD-10-CM | POA: Diagnosis not present

## 2020-07-11 NOTE — Therapy (Signed)
[place], [place], [address] Phone: [phone]   Fax:  [phone]  Physical Therapy Treatment  Patient Details  Name: Ryan Wilkerson MRN: [id number] Date of Birth: 03-12-1946 Referring Provider (PT): Cira Rue NP   Encounter Date: 07/11/2020   PT End of Session - 07/11/20 1352    Visit Number 7    Number of Visits 12    Date for PT Re-Evaluation 07/30/20    PT Start Time 0630    PT Stop Time 1401    PT Time Calculation (min) 56 min    Activity Tolerance Patient tolerated treatment well;No increased pain    Behavior During Therapy WFL for tasks assessed/performed           Past Medical History:  Diagnosis Date  . Erectile dysfunction   . Essential tremor   . Family history of breast cancer   . Family history of prostate cancer   . Family history of stomach cancer   . GERD (gastroesophageal reflux disease)   . History of colon polyps   . Hypercholesterolemia   . Hypertension   . Nocturia   . Prostate cancer [facility]) urologist-- dr Tresa Moore /  oncologist-- dr manning   prostate bx 06-24-2016 and 09-15-2017 at dr Tresa Moore office  Stage T1c, Gleason 4+3, PSA 8.48, vol 33cc---  planned external beam radiation therpay  . Urgency of urination   . Wears glasses     Past Surgical History:  Procedure Laterality Date  . COLONOSCOPY  last one 06/ 2018  . GOLD SEED IMPLANT N/A 12/23/2017   Procedure: GOLD SEED IMPLANT;  Surgeon: Alexis Frock, MD;  Location: [facility];  Service: Urology;  Laterality: N/A;  . LEFT HEART CATH AND CORONARY ANGIOGRAPHY N/A 05/12/2020   Procedure: LEFT HEART CATH AND CORONARY ANGIOGRAPHY;  Surgeon: Burnell Blanks, MD;  Location: [place] [facility];  Service: Cardiovascular;  Laterality: N/A;  . PROSTATE BIOPSY  06-24-2016;  09-15-2017-- at dr Tresa Moore office  . SPACE OAR INSTILLATION N/A 12/23/2017   Procedure: SPACE OAR INSTILLATION;  Surgeon:  Alexis Frock, MD;  Location: [facility];  Service: Urology;  Laterality: N/A;    There were no vitals filed for this visit.   Subjective Assessment - 07/11/20 1257    Subjective Felt really good after last visit and feel good today.  Havae to go to the store when I leave here today.  I can do alot more now without alot of fatigue.    Currently in Pain? No/denies                             [facility] Adult PT Treatment/Exercise - 07/11/20 0001      Lumbar Exercises: Aerobic   Nustep 5 min      Lumbar Exercises: Supine   Bent Knee Raise 10 reps   with opposite arm   Straight Leg Raise 10 reps   ea leg   Other Supine Lumbar Exercises hip ext arc x 10 sigle leg.      Knee/Hip Exercises: Standing   Heel Raises 3 sets;10 reps   on ax   Lateral Step Up Right;Left;2 sets;10 reps   8 in step up   Forward Step Up Left;Right;2 sets;10 reps   8 in partial step ups   Functional Squat 2 sets;10 reps    SLS cone touches x 10 ea leg   on chair  SLS with Vectors 8 ea    Gait Training tandem walk x 2, carioca x4 lengths in parallel bar    Other Standing Knee Exercises marching on ax x 30   no hands   Other Standing Knee Exercises sit to stand from chair  2 x 10       arms crossed     Shoulder Exercises: Standing   Other Standing Exercises 3 D shoulder 10 reps ea    Other Standing Exercises Elbow flex and ext  2x 10 4#   back against wall, activate core                   PT Short Term Goals - 06/18/20 1918      PT SHORT TERM GOAL #1   Title Pt. will be independent in a HEP for generalized strengthening, balance and endurance training    Time 3    Period Weeks    Status New    Target Date 07/09/20             PT Long Term Goals - 06/18/20 1923      PT LONG TERM GOAL #1   Title Pt will be able to complete TUG in 12 sec or less for decreased risk of falls    Baseline 14    Time 4    Period Weeks    Status New    Target Date 07/16/20       PT LONG TERM GOAL #2   Title Pt will be able to perform 12 sit to stands in 30 second sit to stand test for decreased risk of falls    Baseline 5    Time 5    Period Weeks    Status New    Target Date 07/23/20      PT LONG TERM GOAL #3   Title --    Baseline 100 yd    Time 5    Period Weeks    Status New    Target Date 07/23/20      PT LONG TERM GOAL #4   Title Pt will have bilateral LE strength 5/5    Time 6    Period Weeks    Status New    Target Date 07/30/20                 Plan - 07/11/20 1353    Clinical Impression Statement pt continues to  show improved higher level balance.  He has minimal hip abd fatigue with SLS activities.  He is using good form with core stabs    Comorbidities Prior prostate Cancer, neuroendocrine tumor with METs, Prior CVA    Examination-Activity Limitations Locomotion Level;Stairs;Transfers;Stand    Examination-Participation Restrictions Community Activity;Yard Work;Other    Stability/Clinical Decision Making Stable/Uncomplicated    Rehab Potential Good    PT Frequency 2x / week    PT Duration 6 weeks    PT Treatment/Interventions Balance training;Therapeutic exercise;Therapeutic activities;Gait training;Patient/family education;Energy conservation    PT Next Visit Plan retest sit to stand and TUG    Consulted and Agree with Plan of Care Patient           Patient will benefit from skilled therapeutic intervention in order to improve the following deficits and impairments:  Decreased activity tolerance, Decreased endurance, Decreased strength, Impaired perceived functional ability, Difficulty walking, Dizziness  Visit Diagnosis: Muscle weakness (generalized)  Difficulty in walking, not elsewhere classified     Problem List Patient Active Problem List  Diagnosis Date Noted  . ST elevation myocardial infarction (STEMI) ([place])   . Acute idiopathic pericarditis   . Chest pain 05/12/2020  . Chest pain of uncertain  etiology   . 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 Goals of care, counseling/discussion 05/01/2020  . Genetic testing 11/13/2017  . Family history of prostate cancer   . Family history of breast cancer   . Family history of stomach cancer   . Malignant neoplasm of prostate ([place]) 08/31/2017    Claris Pong, PT 07/11/2020, 2:03 PM  [place], [place], [address] Phone: [phone]   Fax:  [phone]  Name: KIERRE HINTZ MRN: [id number] Date of Birth: 1946-05-29

## 2020-07-12 DIAGNOSIS — C61 Malignant neoplasm of prostate: Secondary | ICD-10-CM | POA: Diagnosis not present

## 2020-07-16 ENCOUNTER — Other Ambulatory Visit: Payer: Self-pay

## 2020-07-16 ENCOUNTER — Ambulatory Visit: Payer: Medicare HMO

## 2020-07-16 DIAGNOSIS — R262 Difficulty in walking, not elsewhere classified: Secondary | ICD-10-CM

## 2020-07-16 DIAGNOSIS — M6281 Muscle weakness (generalized): Secondary | ICD-10-CM

## 2020-07-16 NOTE — Patient Instructions (Signed)
Access Code: [email or web address] URL: [email or web address] Date: 07/16/2020 Prepared by: Cheral Almas  Exercises Standing Shoulder Row with Anchored Resistance - 1 x daily - 7 x weekly - 1 sets - 15 reps Shoulder External Rotation and Scapular Retraction with Resistance - 1 x daily - 7 x weekly - 1 sets - 15 reps Shoulder Extension with Resistance - 1 x daily - 7 x weekly - 1 sets - 15 reps

## 2020-07-16 NOTE — Therapy (Signed)
[place], [place], [address] Phone: [phone]   Fax:  [phone]  Physical Therapy Treatment  Patient Details  Name: PENG THORSTENSON MRN: [id number] Date of Birth: 10-28-1945 Referring Provider (PT): Cira Rue NP   Encounter Date: 07/16/2020   PT End of Session - 07/16/20 1431    Visit Number 8    Number of Visits 12    Date for PT Re-Evaluation 07/30/20    PT Start Time 1409    PT Stop Time 4403    PT Time Calculation (min) 47 min    Activity Tolerance Patient tolerated treatment well;No increased pain    Behavior During Therapy WFL for tasks assessed/performed           Past Medical History:  Diagnosis Date  . Erectile dysfunction   . Essential tremor   . Family history of breast cancer   . Family history of prostate cancer   . Family history of stomach cancer   . GERD (gastroesophageal reflux disease)   . History of colon polyps   . Hypercholesterolemia   . Hypertension   . Nocturia   . Prostate cancer [facility]) urologist-- dr Tresa Moore /  oncologist-- dr manning   prostate bx 06-24-2016 and 09-15-2017 at dr Tresa Moore office  Stage T1c, Gleason 4+3, PSA 8.48, vol 33cc---  planned external beam radiation therpay  . Urgency of urination   . Wears glasses     Past Surgical History:  Procedure Laterality Date  . COLONOSCOPY  last one 06/ 2018  . GOLD SEED IMPLANT N/A 12/23/2017   Procedure: GOLD SEED IMPLANT;  Surgeon: Alexis Frock, MD;  Location: [facility];  Service: Urology;  Laterality: N/A;  . LEFT HEART CATH AND CORONARY ANGIOGRAPHY N/A 05/12/2020   Procedure: LEFT HEART CATH AND CORONARY ANGIOGRAPHY;  Surgeon: Burnell Blanks, MD;  Location: [place] [facility];  Service: Cardiovascular;  Laterality: N/A;  . PROSTATE BIOPSY  06-24-2016;  09-15-2017-- at dr Tresa Moore office  . SPACE OAR INSTILLATION N/A 12/23/2017   Procedure: SPACE OAR INSTILLATION;  Surgeon:  Alexis Frock, MD;  Location: [facility];  Service: Urology;  Laterality: N/A;    There were no vitals filed for this visit.   Subjective Assessment - 07/16/20 1409    Subjective Did well after last visit.  Feel good today.  Went to the store after I left here and was there about 20 min and did fine.  Walked 100 yds yesterday and didn't feel too tired. Felt a little dizzy this am but I am fine now. I weighed this am and I am up to 149 lbs.  I havent seen that in a long time.    Pertinent History Pt had prostate Cancer with seeding in 11/2017.  They had to stop the treatment because of problems with liver, pancreas and small intestine (METS)Treatment is on hold now for a couple of months to help him try and get stronger. Still can't tell that endurance is any better.  Have gained a couple of pounds now.    How long can you sit comfortably? unlimited    Patient Stated Goals to turn wt loss around, be able to walk a little longer, have more endurance    Currently in Pain? No/denies              [facility] PT Assessment - 07/16/20 0001      Transfers   Five time sit to stand comments  9 reps   30 sec.      Timed Up and Go Test   Normal TUG (seconds) 11                         [facility] Adult PT Treatment/Exercise - 07/16/20 0001      Neuro Re-ed    Neuro Re-ed Details  10 taps ea leg   cone in chair     Lumbar Exercises: Supine   Bent Knee Raise 10 reps   with opposite arm   Bridge 15 reps    Straight Leg Raise 20 reps    Other Supine Lumbar Exercises hip ext arc 2 x 10 sigle leg.      Knee/Hip Exercises: Standing   Heel Raises 3 sets;10 reps   on ax, no hands   Lateral Step Up Right;Left;2 sets;10 reps   8 in step up   Forward Step Up Left;Right;2 sets;10 reps   8in   Functional Squat 2 sets;10 reps    Functional Squat Limitations --   verbal cues and demo for proper form   Lunge Walking - Round Trips 2 x 10 ea   stationary with hand hold   SLS cone  touches x 10 ea leg   on chair   SLS with Vectors 10 ea    Other Standing Knee Exercises marching on ax x 30   no hands     Shoulder Exercises: Standing   External Rotation Strengthening;Both;15 reps    Theraband Level (Shoulder External Rotation) Level 1 (Yellow)    Row Strengthening;Both;15 reps    Theraband Level (Shoulder Row) Level 1 (Yellow)    Retraction Strengthening;Both;15 reps    Theraband Level (Shoulder Retraction) Level 1 (Yellow)                  PT Education - 07/16/20 1755    Education Details Pt was educated in [place] for Bilateral scapular retraction, shoulder ext and bilateral ER all with yellow and was given yellow band for HEP    Person(s) Educated Patient    Methods Explanation;Demonstration;Handout    Comprehension Verbalized understanding;Returned demonstration            PT Short Term Goals - 06/18/20 1918      PT SHORT TERM GOAL #1   Title Pt. will be independent in a HEP for generalized strengthening, balance and endurance training    Time 3    Period Weeks    Status New    Target Date 07/09/20             PT Long Term Goals - 06/18/20 1923      PT LONG TERM GOAL #1   Title Pt will be able to complete TUG in 12 sec or less for decreased risk of falls    Baseline 14    Time 4    Period Weeks    Status New    Target Date 07/16/20      PT LONG TERM GOAL #2   Title Pt will be able to perform 12 sit to stands in 30 second sit to stand test for decreased risk of falls    Baseline 5    Time 5    Period Weeks    Status New    Target Date 07/23/20      PT LONG TERM GOAL #3   Title --    Baseline 100 yd    Time 5  Period Weeks    Status New    Target Date 07/23/20      PT LONG TERM GOAL #4   Title Pt will have bilateral LE strength 5/5    Time 6    Period Weeks    Status New    Target Date 07/30/20                 Plan - 07/16/20 1757    Clinical Impression Statement Pt was able to do TUG test 3 seconds  faster.  He was able to perform 4 more sit to stand from chair in 30 seconds than at initial evaluation.  He is gaining weight, and is now able to enjoy short community activities.  He will be ready to DC in the next visit or 2.    Personal Factors and Comorbidities Age;Comorbidity 1;Comorbidity 2    Comorbidities Prior prostate Cancer, neuroendocrine tumor with METs, Prior CVA    Examination-Activity Limitations Locomotion Level;Stairs;Transfers;Stand    Examination-Participation Restrictions Community Activity;Yard Work;Other    Stability/Clinical Decision Making Stable/Uncomplicated    Clinical Decision Making Low    Rehab Potential Good    PT Frequency 2x / week    PT Duration 6 weeks    PT Treatment/Interventions Balance training;Therapeutic exercise;Therapeutic activities;Gait training;Patient/family education;Energy conservation    PT Next Visit Plan DC next visit or 2, Continue therex for strength, balance, endurance    PT Home Exercise Plan increase walking time to 15 min 3xs per day, exs for HEP as instructed    Consulted and Agree with Plan of Care Patient           Patient will benefit from skilled therapeutic intervention in order to improve the following deficits and impairments:  Decreased activity tolerance, Decreased endurance, Decreased strength, Impaired perceived functional ability, Difficulty walking, Dizziness  Visit Diagnosis: Muscle weakness (generalized)  Difficulty in walking, not elsewhere classified     Problem List Patient Active Problem List   Diagnosis Date Noted  . ST elevation myocardial infarction (STEMI) ([place])   . Acute idiopathic pericarditis   . Chest pain 05/12/2020  . Chest pain of uncertain etiology   . 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 Goals of care, counseling/discussion 05/01/2020  . Genetic testing 11/13/2017  . Family history of prostate cancer   . Family history of breast cancer   . Family  history of stomach cancer   . Malignant neoplasm of prostate ([place]) 08/31/2017    Sherrin Daisy 07/16/2020, 6:02 PM  [place], [place], [address] Phone: [phone]   Fax:  [phone]  Name: CLENTON ESPER MRN: [id number] Date of Birth: July 29, 1946 Cheral Almas, PT 07/16/20 6:04 PM

## 2020-07-17 DIAGNOSIS — C7A019 Malignant carcinoid tumor of the small intestine, unspecified portion: Secondary | ICD-10-CM | POA: Diagnosis not present

## 2020-07-17 DIAGNOSIS — C61 Malignant neoplasm of prostate: Secondary | ICD-10-CM | POA: Diagnosis not present

## 2020-07-18 ENCOUNTER — Ambulatory Visit: Payer: Medicare HMO

## 2020-07-18 ENCOUNTER — Other Ambulatory Visit: Payer: Self-pay

## 2020-07-18 DIAGNOSIS — R262 Difficulty in walking, not elsewhere classified: Secondary | ICD-10-CM | POA: Diagnosis not present

## 2020-07-18 DIAGNOSIS — M6281 Muscle weakness (generalized): Secondary | ICD-10-CM | POA: Diagnosis not present

## 2020-07-18 NOTE — Therapy (Signed)
[place], [place], [address] Phone: [phone]   Fax:  [phone]  Physical Therapy Treatment  Patient Details  Name: Ryan Wilkerson MRN: [id number] Date of Birth: 12-Aug-1946 Referring Provider (PT): Cira Rue NP   Encounter Date: 07/18/2020   PT End of Session - 07/18/20 1408    Visit Number 9    Number of Visits 12    Date for PT Re-Evaluation 07/30/20    PT Start Time 1302    PT Stop Time 1400    PT Time Calculation (min) 58 min    Activity Tolerance Patient tolerated treatment well;No increased pain    Behavior During Therapy WFL for tasks assessed/performed           Past Medical History:  Diagnosis Date   Erectile dysfunction    Essential tremor    Family history of breast cancer    Family history of prostate cancer    Family history of stomach cancer    GERD (gastroesophageal reflux disease)    History of colon polyps    Hypercholesterolemia    Hypertension    Nocturia    Prostate cancer [facility]) urologist-- dr Tresa Moore /  oncologist-- dr manning   prostate bx 06-24-2016 and 09-15-2017 at dr Tresa Moore office  Stage T1c, Gleason 4+3, PSA 8.48, vol 33cc---  planned external beam radiation therpay   Urgency of urination    Wears glasses     Past Surgical History:  Procedure Laterality Date   COLONOSCOPY  last one 06/ 2018   GOLD SEED IMPLANT N/A 12/23/2017   Procedure: GOLD SEED IMPLANT;  Surgeon: Alexis Frock, MD;  Location: [facility];  Service: Urology;  Laterality: N/A;   LEFT HEART CATH AND CORONARY ANGIOGRAPHY N/A 05/12/2020   Procedure: LEFT HEART CATH AND CORONARY ANGIOGRAPHY;  Surgeon: Burnell Blanks, MD;  Location: [place] [facility];  Service: Cardiovascular;  Laterality: N/A;   PROSTATE BIOPSY  06-24-2016;  09-15-2017-- at dr Tresa Moore office   [place] N/A 12/23/2017   Procedure: [place];  Surgeon:  Alexis Frock, MD;  Location: [facility];  Service: Urology;  Laterality: N/A;    There were no vitals filed for this visit.   Subjective Assessment - 07/18/20 1304    Subjective Did well last time.  Have done some walking in the house for 20 min and felt good.  No present pain.  Think I have improved alot    Pertinent History Pt had prostate Cancer with seeding in 11/2017.  They had to stop the treatment because of problems with liver, pancreas and small intestine (METS)Treatment is on hold now for a couple of months to help him try and get stronger. Still can't tell that endurance is any better.  Have gained a couple of pounds now.    How long can you sit comfortably? unlimited    How long can you stand comfortably? 3-5 min;  gets a little dizzy sometimes    How long can you walk comfortably? 100 yds before he has to rest;Gets hot flashes but don't last long but usually happen when in standing position    Patient Stated Goals to turn wt loss around, be able to walk a little longer, have more endurance    Currently in Pain? No/denies    Pain Onset More than a month ago  [facility] Adult PT Treatment/Exercise - 07/18/20 0001      Lumbar Exercises: Supine   Clam 20 reps   red TB   Other Supine Lumbar Exercises hip ext arc 2 x 10 sigle leg.    Other Supine Lumbar Exercises supine HA (yellow) with ppt      Knee/Hip Exercises: Standing   Lateral Step Up Right;Left;2 sets;10 reps   8 in step up   Forward Step Up Left;Right;2 sets;10 reps   8in   Functional Squat 2 sets;10 reps   with cahir behind   Lunge Walking - Round Trips 2 x 10 ea   stationary with hand hold   Rocker Board 1 minute   F and B, side to side   SLS cone touches x 15 ea leg   on chair   SLS with Vectors 10 ea    Other Standing Knee Exercises sit to stand 2 x 10      Shoulder Exercises: Standing   Horizontal ABduction Strengthening;Both;10 reps    Theraband Level  (Shoulder Horizontal ABduction) Level 1 (Yellow)    External Rotation Strengthening;Both;15 reps    Theraband Level (Shoulder External Rotation) Level 1 (Yellow)    Row Strengthening;Both;15 reps    Theraband Level (Shoulder Row) Level 1 (Yellow)    Retraction Strengthening;Both;15 reps    Theraband Level (Shoulder Retraction) Level 1 (Yellow)    Other Standing Exercises 3 D shoulder raises 2 x10    Other Standing Exercises --   Bilateral elbow flex 4 # 2 x 10                 PT Education - 07/18/20 1407    Education Details Pt was educated in standing or supine Horizontal abd with yellow TB, sit to stand or mini squats for HEP, and supine SLR with PPT.  Pt was given illustrated and written instructions    Person(s) Educated Patient    Methods Explanation;Handout    Comprehension Verbalized understanding;Returned demonstration            PT Short Term Goals - 06/18/20 1918      PT SHORT TERM GOAL #1   Title Pt. will be independent in a HEP for generalized strengthening, balance and endurance training    Time 3    Period Weeks    Status New    Target Date 07/09/20             PT Long Term Goals - 06/18/20 1923      PT LONG TERM GOAL #1   Title Pt will be able to complete TUG in 12 sec or less for decreased risk of falls    Baseline 14    Time 4    Period Weeks    Status New    Target Date 07/16/20      PT LONG TERM GOAL #2   Title Pt will be able to perform 12 sit to stands in 30 second sit to stand test for decreased risk of falls    Baseline 5    Time 5    Period Weeks    Status New    Target Date 07/23/20      PT LONG TERM GOAL #3   Title --    Baseline 100 yd    Time 5    Period Weeks    Status New    Target Date 07/23/20      PT LONG TERM GOAL #4   Title Pt will have  bilateral LE strength 5/5    Time 6    Period Weeks    Status New    Target Date 07/30/20                 Plan - 07/18/20 1409    Clinical Impression Statement Pt  continues to improve with balance and endurance.  He can walk 20 min at a time now without undue fatigue.  Hip abd no longer fatigue like they did.  We will DC next week.    Personal Factors and Comorbidities Age;Comorbidity 1;Comorbidity 2    Comorbidities Prior prostate Cancer, neuroendocrine tumor with METs, Prior CVA    Examination-Activity Limitations Locomotion Level;Stairs;Transfers;Stand    Examination-Participation Restrictions Community Activity;Yard Work;Other    Stability/Clinical Decision Making Stable/Uncomplicated    Rehab Potential Good    PT Frequency 2x / week    PT Duration 6 weeks    PT Treatment/Interventions Balance training;Therapeutic exercise;Therapeutic activities;Gait training;Patient/family education;Energy conservation    PT Next Visit Plan retest sit to stand for 30 min, check LE strength    PT Home Exercise Plan continue to progress walking time, HEP as instructed    Consulted and Agree with Plan of Care Patient           Patient will benefit from skilled therapeutic intervention in order to improve the following deficits and impairments:  Decreased activity tolerance, Decreased endurance, Decreased strength, Impaired perceived functional ability, Difficulty walking, Dizziness  Visit Diagnosis: Muscle weakness (generalized)  Difficulty in walking, not elsewhere classified     Problem List Patient Active Problem List   Diagnosis Date Noted   ST elevation myocardial infarction (STEMI) ([place])    Acute idiopathic pericarditis    Chest pain 05/12/2020   Chest pain of uncertain etiology    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

## 2020-07-18 NOTE — Patient Instructions (Signed)
Access Code: [email or web address] URL: [email or web address] Date: 07/18/2020 Prepared by: Cheral Almas  Exercises Standing Shoulder Horizontal Abduction with Resistance - 1 x daily - 7 x weekly - 1 sets - 10 reps Sit to Stand without Arm Support - 1 x daily - 7 x weekly - 2 sets - 10 reps Supine Transversus Abdominis Bracing with Leg Extension - 1 x daily - 7 x weekly - 2 sets - 10 reps Supine Active Straight Leg Raise - 1 x daily - 7 x weekly - 2 sets - 10 reps

## 2020-07-24 ENCOUNTER — Other Ambulatory Visit: Payer: Self-pay

## 2020-07-24 ENCOUNTER — Ambulatory Visit: Payer: Medicare HMO

## 2020-07-24 DIAGNOSIS — M6281 Muscle weakness (generalized): Secondary | ICD-10-CM

## 2020-07-24 DIAGNOSIS — R262 Difficulty in walking, not elsewhere classified: Secondary | ICD-10-CM

## 2020-07-24 NOTE — Therapy (Signed)
[place], [place], [address] Phone: [phone]   Fax:  [phone]  Physical Therapy Treatment  Patient Details  Name: Ryan Wilkerson MRN: [id number] Date of Birth: [birth year] Referring Provider (PT): Cira Rue NP   Encounter Date: 07/24/2020   PT End of Session - 07/24/20 1515    Visit Number 10    Number of Visits 12    Date for PT Re-Evaluation 07/30/20    PT Start Time 9518    PT Stop Time 8416    PT Time Calculation (min) 54 min    Activity Tolerance Patient tolerated treatment well;No increased pain    Behavior During Therapy WFL for tasks assessed/performed           Past Medical History:  Diagnosis Date  . Erectile dysfunction   . Essential tremor   . Family history of breast cancer   . Family history of prostate cancer   . Family history of stomach cancer   . GERD (gastroesophageal reflux disease)   . History of colon polyps   . Hypercholesterolemia   . Hypertension   . Nocturia   . Prostate cancer [facility]) urologist-- dr Tresa Moore /  oncologist-- dr manning   prostate bx 06-24-2016 and 09-15-2017 at dr Tresa Moore office  Stage T1c, Gleason 4+3, PSA 8.48, vol 33cc---  planned external beam radiation therpay  . Urgency of urination   . Wears glasses     Past Surgical History:  Procedure Laterality Date  . COLONOSCOPY  last one 06/ 2018  . GOLD SEED IMPLANT N/A 12/23/2017   Procedure: GOLD SEED IMPLANT;  Surgeon: Alexis Frock, MD;  Location: [facility];  Service: Urology;  Laterality: N/A;  . LEFT HEART CATH AND CORONARY ANGIOGRAPHY N/A 05/12/2020   Procedure: LEFT HEART CATH AND CORONARY ANGIOGRAPHY;  Surgeon: Burnell Blanks, MD;  Location: [facility];  Service: Cardiovascular;  Laterality: N/A;  . PROSTATE BIOPSY  06-24-2016;  09-15-2017-- at dr Tresa Moore office  . SPACE OAR INSTILLATION N/A 12/23/2017   Procedure: SPACE OAR INSTILLATION;  Surgeon:  Alexis Frock, MD;  Location: [facility];  Service: Urology;  Laterality: N/A;    There were no vitals filed for this visit.   Subjective Assessment - 07/24/20 1404    Subjective Still walking a lot better. I dob't have the fatigue like I used to.  Still walking 20 min at a time.  Endurance is alot better, and I haven't had the dizziness much at all.  Having some pain under right rib 4/10 that just started today (but was gone after rx)  Reports compliance with HEP. I  dont have to hold onto furniture or walls anymore when I am walking.    Pertinent History Pt had prostate Cancer with seeding in 11/2017.  They had to stop the treatment because of problems with liver, pancreas and small intestine (METS)Treatment is on hold now for a couple of months to help him try and get stronger. Still can't tell that endurance is any better.  Have gained a couple of pounds now.    How long can you sit comfortably? unlimited    How long can you stand comfortably? can stand longer without dizziness    How long can you walk comfortably? can now walk 20 min without undue fatigue    Patient Stated Goals has now gained some weight, walking longer and has more endurance    Currently in Pain? Yes  Pain Score 4     Pain Location Rib cage    Pain Orientation Right;Medial    Pain Descriptors / Indicators Throbbing;Aching    Pain Type Chronic pain    Pain Onset More than a month ago    Pain Frequency Intermittent              [facility] PT Assessment - 07/24/20 0001      Strength   Overall Strength Within functional limits for tasks performed    Overall Strength Comments 5/5 bilateral LE      Transfers   Five time sit to stand comments  12 reps 30 sec                         [facility] Adult PT Treatment/Exercise - 07/24/20 0001      Lumbar Exercises: Supine   Pelvic Tilt 10 reps;5 seconds    Bridge 20 reps    Straight Leg Raise 20 reps   with ppt bilateral     Knee/Hip  Exercises: Standing   Lateral Step Up Right;Left;2 sets;10 reps   8 in step up   Forward Step Up Left;Right;2 sets;10 reps   8in   Lunge Walking - Round Trips 2 x 10 ea   stationary with hand hold   SLS cone touches x 15 ea leg   on chair   SLS with Vectors 10 ea      Shoulder Exercises: Standing   Other Standing Exercises 3 D shoulder raises x10 1#, x 10 not wt    Other Standing Exercises elbow flex 4# 2x  12                  PT Education - 07/24/20 1514    Education Details Discused with pt ways to progress exercises including increasing repetitions before increasing resistance.  Reviewed HEP for UE, LE strength,core strength and  balance,    Person(s) Educated Patient    Methods Explanation    Comprehension Verbalized understanding;Returned demonstration            PT Short Term Goals - 07/24/20 1419      PT SHORT TERM GOAL #1   Title Pt. will be independent in a HEP for generalized strengthening, balance and endurance training    Time 3    Period Weeks    Status Achieved             PT Long Term Goals - 07/24/20 1419      PT LONG TERM GOAL #1   Title Pt will be able to complete TUG in 12 sec or less for decreased risk of falls    Baseline 14, 12 sec    Time 4    Period Weeks    Status Achieved      PT LONG TERM GOAL #2   Title Pt will be able to perform 12 sit to stands in 30 second sit to stand test for decreased risk of falls    Baseline 5 , today 12   Time 5    Period Weeks    Status Achieved      PT LONG TERM GOAL #3   Title able to walk greater than 100 yards without fatigue    Time 5    Period Weeks    Status Achieved      PT LONG TERM GOAL #4   Title Pt will have bilateral LE strength 5/5    Period Weeks  Status Achieved                 Plan - 07/24/20 1516    Clinical Impression Statement Pt has achieved all goals established at initial evaluation.  He is feeling much better, notes good improvement with endurance and  strength, and no longer reaches for walls or furniture with walking.  He is eating better and has started to gain weight.  He and I are very pleased with his progress,.  He is being released to continue with his HEP, and was advised to call should he have any questions or concerns.  He was advised to always listen to his body, rest prn, and progress exercises as instructed to do so safely.    Personal Factors and Comorbidities Age;Comorbidity 1;Comorbidity 2    Comorbidities Prior prostate Cancer, neuroendocrine tumor with METs, Prior CVA    Examination-Activity Limitations Locomotion Level;Stairs;Transfers;Stand    Examination-Participation Restrictions Community Activity;Yard Work;Other    Stability/Clinical Decision Making Stable/Uncomplicated    Clinical Decision Making Low    Rehab Potential Good    PT Frequency 2x / week    PT Duration 6 weeks    PT Treatment/Interventions Balance training;Therapeutic exercise;Therapeutic activities;Gait training;Patient/family education;Energy conservation    PT Next Visit Plan NA    PT Home Exercise Plan continue to progress walking time, exercises as tolerated and instructed    Consulted and Agree with Plan of Care Patient           Patient will benefit from skilled therapeutic intervention in order to improve the following deficits and impairments:  Decreased activity tolerance, Decreased endurance, Decreased strength, Impaired perceived functional ability, Difficulty walking, Dizziness  Visit Diagnosis: Muscle weakness (generalized)  Difficulty in walking, not elsewhere classified     Problem List Patient Active Problem List   Diagnosis Date Noted  . ST elevation myocardial infarction (STEMI) ([place])   . Acute idiopathic pericarditis   . Chest pain 05/12/2020  . Chest pain of uncertain etiology   . 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 Goals of care, counseling/discussion 05/01/2020  . Genetic testing  11/13/2017  . Family history of prostate cancer   . Family history of breast cancer   . Family history of stomach cancer   . Malignant neoplasm of prostate ([place]) 08/31/2017   PHYSICAL THERAPY DISCHARGE SUMMARY  Visits from Start of Care: 10  Current functional level related to goals / functional outcomes: Independent with all.  Achieved all goals established at initial evaluation   Remaining deficits: None noted   Education / Equipment: Pt was educated in a comprehensive HEP for strength of UE, LE, Core strength and balance Plan: Patient agrees to discharge.  Patient goals were met. Patient is being discharged due to meeting the stated rehab goals.  ?????       Claris Pong 07/24/2020, 3:21 PM  [place], [place], [address] Phone: [phone]   Fax:  [phone]  Name: CYREE CHUONG MRN: [id number] Date of Birth: [birth year]  Cheral Almas, PT 07/24/20 3:26 PM

## 2020-07-31 ENCOUNTER — Emergency Department (HOSPITAL_BASED_OUTPATIENT_CLINIC_OR_DEPARTMENT_OTHER): Payer: Medicare HMO

## 2020-07-31 ENCOUNTER — Other Ambulatory Visit: Payer: Self-pay

## 2020-07-31 ENCOUNTER — Emergency Department (HOSPITAL_BASED_OUTPATIENT_CLINIC_OR_DEPARTMENT_OTHER)
Admission: EM | Admit: 2020-07-31 | Discharge: 2020-07-31 | Disposition: A | Discharge status: Home / Self Care | Payer: Medicare HMO | Attending: Emergency Medicine | Admitting: Emergency Medicine

## 2020-07-31 ENCOUNTER — Encounter (HOSPITAL_BASED_OUTPATIENT_CLINIC_OR_DEPARTMENT_OTHER): Payer: Self-pay | Admitting: *Deleted

## 2020-07-31 DIAGNOSIS — Z8546 Personal history of malignant neoplasm of prostate: Secondary | ICD-10-CM | POA: Insufficient documentation

## 2020-07-31 DIAGNOSIS — R1084 Generalized abdominal pain: Secondary | ICD-10-CM | POA: Insufficient documentation

## 2020-07-31 DIAGNOSIS — R1013 Epigastric pain: Secondary | ICD-10-CM | POA: Diagnosis not present

## 2020-07-31 DIAGNOSIS — I10 Essential (primary) hypertension: Secondary | ICD-10-CM | POA: Insufficient documentation

## 2020-07-31 DIAGNOSIS — N281 Cyst of kidney, acquired: Secondary | ICD-10-CM | POA: Diagnosis not present

## 2020-07-31 DIAGNOSIS — Z87891 Personal history of nicotine dependence: Secondary | ICD-10-CM | POA: Insufficient documentation

## 2020-07-31 LAB — CBC
HCT: 37.7 % — ABNORMAL LOW (ref 39.0–52.0)
Hemoglobin: 12 g/dL — ABNORMAL LOW (ref 13.0–17.0)
MCH: 29.5 pg (ref 26.0–34.0)
MCHC: 31.8 g/dL (ref 30.0–36.0)
MCV: 92.6 fL (ref 80.0–100.0)
Platelets: 241 10*3/uL (ref 150–400)
RBC: 4.07 MIL/uL — ABNORMAL LOW (ref 4.22–5.81)
RDW: 12.8 % (ref 11.5–15.5)
WBC: 8.9 10*3/uL (ref 4.0–10.5)
nRBC: 0 % (ref 0.0–0.2)

## 2020-07-31 LAB — URINALYSIS, ROUTINE W REFLEX MICROSCOPIC
Bilirubin Urine: NEGATIVE
Glucose, UA: NEGATIVE mg/dL
Hgb urine dipstick: NEGATIVE
Ketones, ur: NEGATIVE mg/dL
Leukocytes,Ua: NEGATIVE
Nitrite: NEGATIVE
Protein, ur: 30 mg/dL — AB
Specific Gravity, Urine: 1.015 (ref 1.005–1.030)
pH: 8.5 — ABNORMAL HIGH (ref 5.0–8.0)

## 2020-07-31 LAB — URINALYSIS, MICROSCOPIC (REFLEX)

## 2020-07-31 LAB — COMPREHENSIVE METABOLIC PANEL
ALT: 42 U/L (ref 0–44)
AST: 34 U/L (ref 15–41)
Albumin: 4 g/dL (ref 3.5–5.0)
Alkaline Phosphatase: 52 U/L (ref 38–126)
Anion gap: 11 (ref 5–15)
BUN: 14 mg/dL (ref 8–23)
CO2: 26 mmol/L (ref 22–32)
Calcium: 9.3 mg/dL (ref 8.9–10.3)
Chloride: 98 mmol/L (ref 98–111)
Creatinine, Ser: 1.1 mg/dL (ref 0.61–1.24)
GFR, Estimated: >60 mL/min (ref >60)
Glucose, Bld: 125 mg/dL — ABNORMAL HIGH (ref 70–99)
Potassium: 3.9 mmol/L (ref 3.5–5.1)
Sodium: 135 mmol/L (ref 135–145)
Total Bilirubin: 0.6 mg/dL (ref 0.3–1.2)
Total Protein: 7.7 g/dL (ref 6.5–8.1)

## 2020-07-31 LAB — LIPASE, BLOOD: Lipase: 21 U/L (ref 11–51)

## 2020-07-31 IMAGING — CT CT ABD-PELV W/ CM
2 of 5 series · 16 of 46 positions shown, 18 images · IV contrast (Omnipaque)
Comparison: [DATE] from [HOSPITAL]

CLINICAL DATA: Chronic abdominal pain for approximately 2 years.
Metastatic neuroendocrine tumor.

EXAM:
CT ABDOMEN AND PELVIS WITH CONTRAST
TECHNIQUE: Multidetector CT imaging of the abdomen and pelvis was performed
using the standard protocol following bolus administration of
intravenous contrast.
CONTRAST:  100mL OMNIPAQUE IOHEXOL 300 MG/ML  SOLN

[Series 2: axial st · axial · 0.70mm/px · z∈[-472,-102]mm · 13 of 84 slices shown, 15 images]
[im 5/84  soft-tissue]
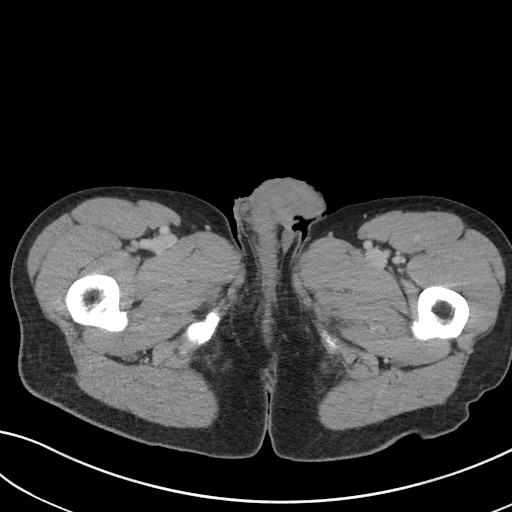
[im 5/84  bone]
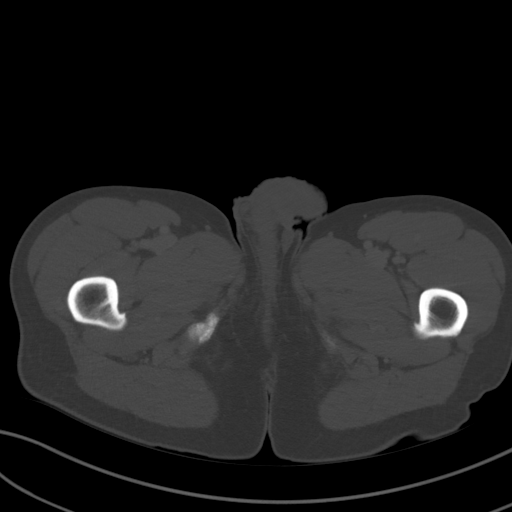
[im 14/84  soft-tissue]
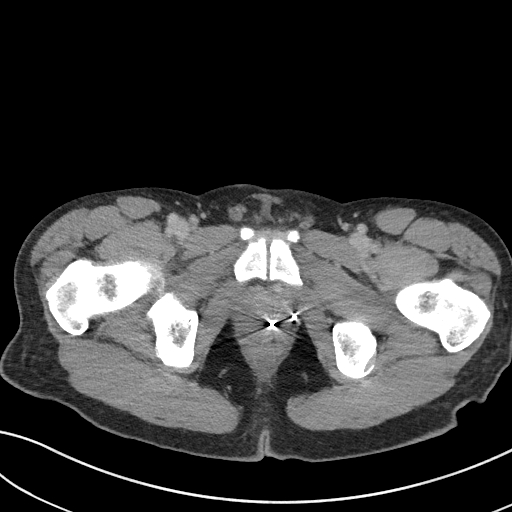
[im 18/84  soft-tissue]
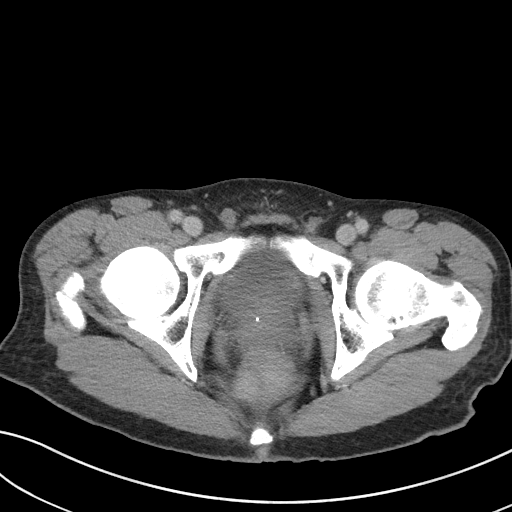
[im 22/84  soft-tissue]
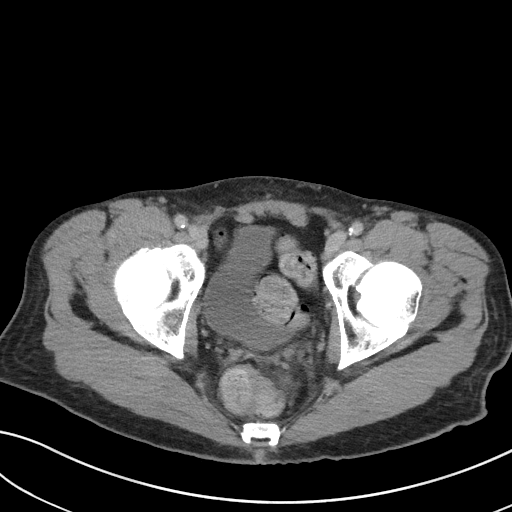
[im 31/84  soft-tissue]
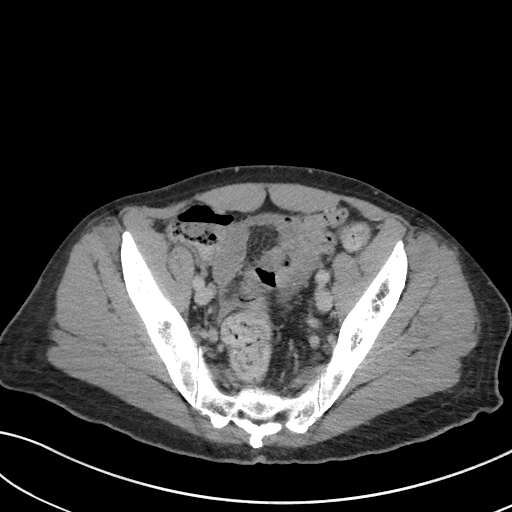
[im 35/84  soft-tissue]
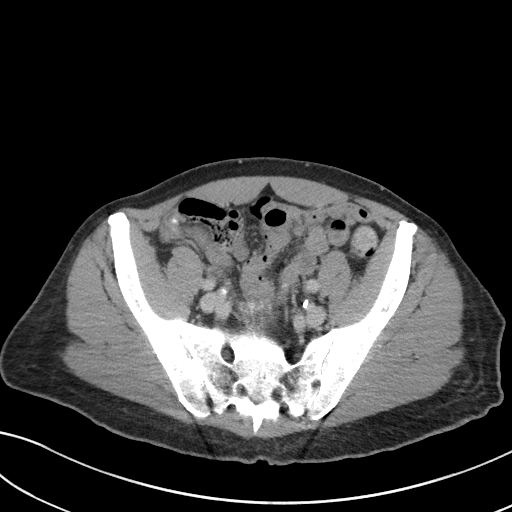
[im 44/84  soft-tissue]
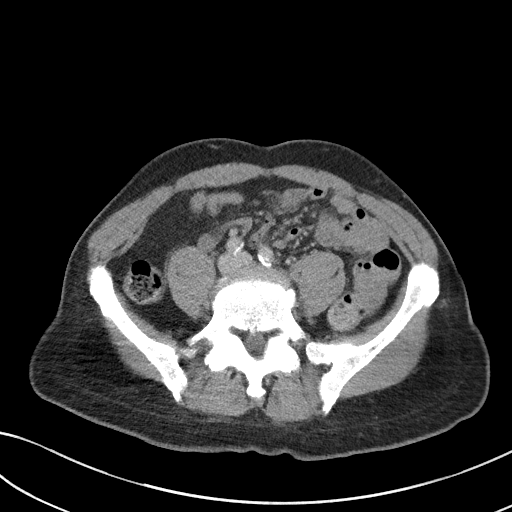
[im 49/84  soft-tissue]
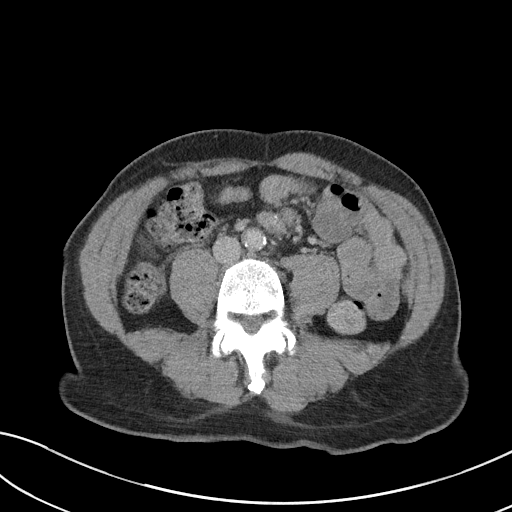
[im 53/84  soft-tissue]
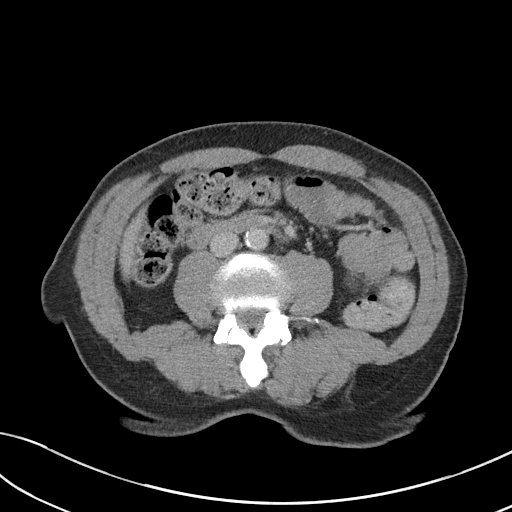
[im 53/84  bone]
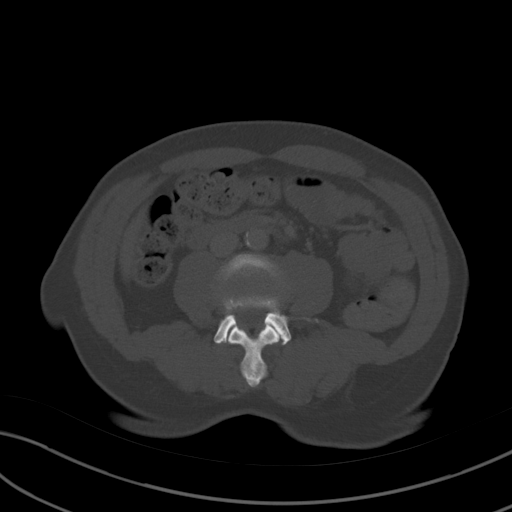
[im 62/84  soft-tissue]
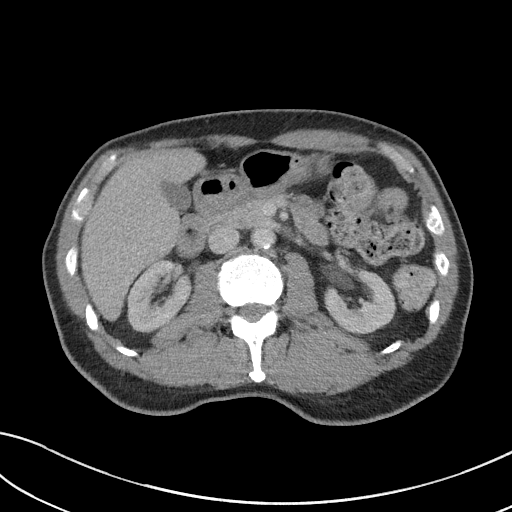
[im 66/84  soft-tissue]
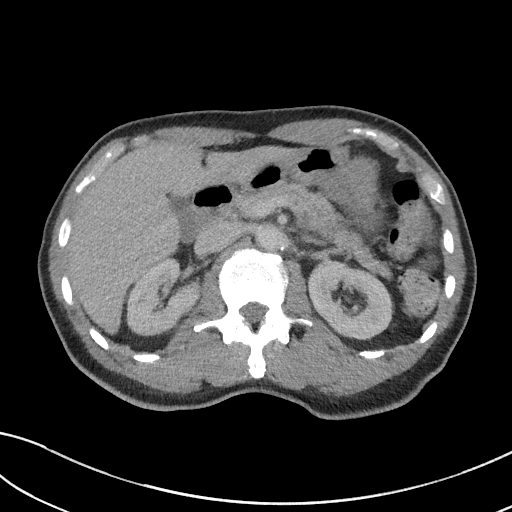
[im 70/84  soft-tissue]
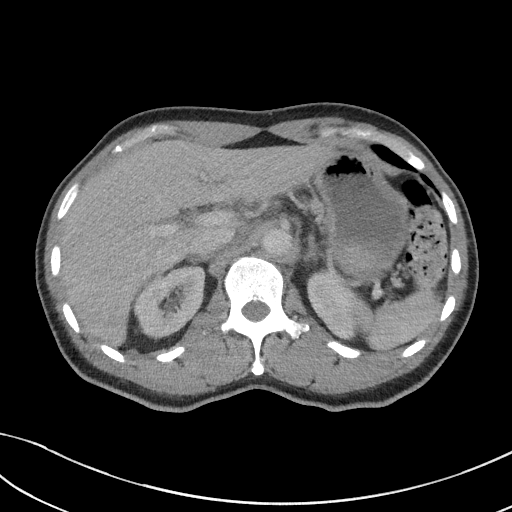
[im 79/84  soft-tissue]
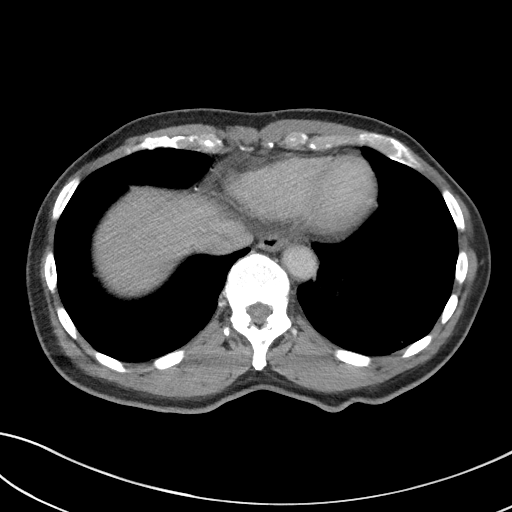

[Series 5: coronal st · coronal · 0.65mm/px · 3 of 90 slices shown]
[im 30/90  soft-tissue]
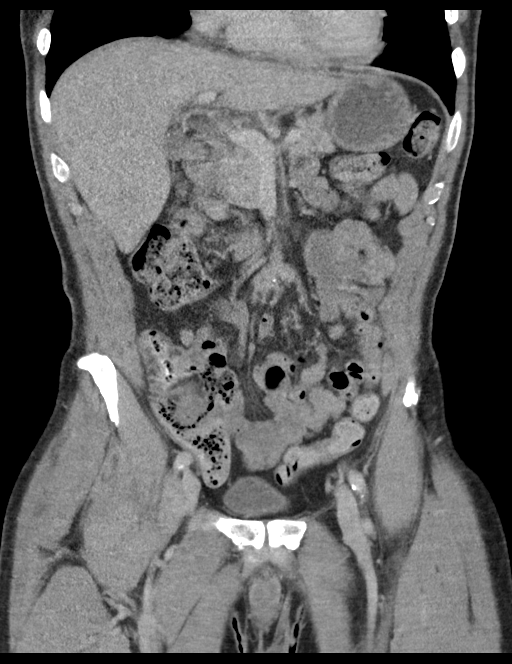
[im 40/90  soft-tissue]
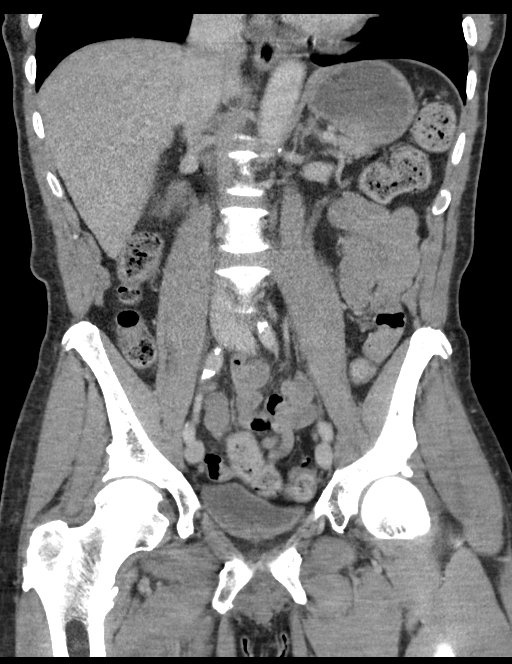
[im 50/90  soft-tissue]
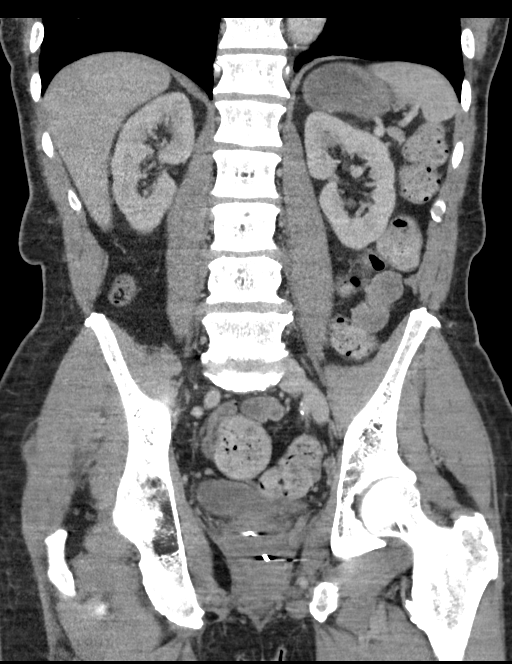

[16 of 46 positions shown; findings below may reference images not displayed]

FINDINGS: Lower Chest: No acute findings.

Hepatobiliary: 2 cm low-attenuation lesion is seen in the inferior
right hepatic lobe, without significant change since prior exam. No
other liver lesions identified by CT. Gallbladder is unremarkable.
No evidence of biliary ductal dilatation.

Pancreas:  No mass or inflammatory changes.

Spleen: Within normal limits in size and appearance.

Adrenals/Urinary Tract: No masses identified. Small cyst again seen
in lower pole of right kidney. No evidence of ureteral calculi or
hydronephrosis.

Stomach/Bowel: No evidence of obstruction, inflammatory process or
abnormal fluid collections.

Vascular/Lymphatic: No pathologically enlarged lymph nodes. No
abdominal aortic aneurysm. Aortic atherosclerotic calcification
noted.

Reproductive: No mass or other significant abnormality. Fiducial
markers again seen in the prostate.

Other:  None.

Musculoskeletal:  No suspicious bone lesions identified.
IMPRESSION: No acute findings.

Stable 2 cm lesion in inferior right hepatic lobe, consistent with
known metastatic neuroendocrine tumor.

Aortic Atherosclerosis ([SX]-[SX]).

## 2020-07-31 MED ORDER — IOHEXOL 300 MG/ML  SOLN
100.0000 mL | Freq: Once | INTRAMUSCULAR | Status: AC | PRN
  Administered 2020-07-31: 100 mL via INTRAVENOUS

## 2020-07-31 NOTE — ED Triage Notes (Addendum)
Abdominal pain x 2 years with no diagnosis by his doctors. He woke at 2am with "hot" sensation in his mid abdomen. He was seen by his MD today where he was given a pain injection.

## 2020-07-31 NOTE — ED Provider Notes (Signed)
[facility] EMERGENCY DEPARTMENT Provider Note   CSN: [id number] Arrival date & time: 07/31/20  1636     History Chief Complaint  Patient presents with  . Abdominal Pain    Ryan Wilkerson is a 74 y.o. male.  The history is provided by the patient.  Abdominal Pain Pain location:  Generalized Pain quality: aching   Pain radiates to:  Does not radiate Onset quality:  Gradual Timing:  Intermittent Progression:  Worsening Context: not previous surgeries   Relieved by:  Nothing Worsened by:  Nothing Associated symptoms: no anorexia, no belching, no chest pain, no chills, no cough, no diarrhea, no dysuria, no fever, no hematuria, no shortness of breath, no sore throat and no vomiting   Risk factors: has not had multiple surgeries        Past Medical History:  Diagnosis Date  . Erectile dysfunction   . Essential tremor   . Family history of breast cancer   . Family history of prostate cancer   . Family history of stomach cancer   . GERD (gastroesophageal reflux disease)   . History of colon polyps   . Hypercholesterolemia   . Hypertension   . Nocturia   . Prostate cancer [facility]) urologist-- dr Tresa Moore /  oncologist-- dr manning   prostate bx 06-24-2016 and 09-15-2017 at dr Tresa Moore office  Stage T1c, Gleason 4+3, PSA 8.48, vol 33cc---  planned external beam radiation therpay  . Urgency of urination   . Wears glasses     Patient Active Problem List   Diagnosis Date Noted  . ST elevation myocardial infarction (STEMI) ([place])   . Acute idiopathic pericarditis   . Chest pain 05/12/2020  . Chest pain of uncertain etiology   . 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 Goals of care, counseling/discussion 05/01/2020  . Genetic testing 11/13/2017  . Family history of prostate cancer   . Family history of breast cancer   . Family history of stomach cancer   . Malignant neoplasm of prostate ([place]) 08/31/2017    Past Surgical History:   Procedure Laterality Date  . COLONOSCOPY  last one 06/ 2018  . GOLD SEED IMPLANT N/A 12/23/2017   Procedure: GOLD SEED IMPLANT;  Surgeon: Alexis Frock, MD;  Location: [facility];  Service: Urology;  Laterality: N/A;  . LEFT HEART CATH AND CORONARY ANGIOGRAPHY N/A 05/12/2020   Procedure: LEFT HEART CATH AND CORONARY ANGIOGRAPHY;  Surgeon: Burnell Blanks, MD;  Location: [facility];  Service: Cardiovascular;  Laterality: N/A;  . PROSTATE BIOPSY  06-24-2016;  09-15-2017-- at dr Tresa Moore office  . SPACE OAR INSTILLATION N/A 12/23/2017   Procedure: SPACE OAR INSTILLATION;  Surgeon: Alexis Frock, MD;  Location: [facility];  Service: Urology;  Laterality: N/A;       Family History  Problem Relation Age of Onset  . Prostate cancer Brother 60  . Prostate cancer Brother        metastatic/ late treatment dx in 60's/70's  . Breast cancer Sister 31  . Prostate cancer Brother        dx in 60's/70's  . Prostate cancer Brother        dx 60's/70's  . Prostate cancer Brother        dx 60's/70's  . Prostate cancer Brother        71's  . Lung cancer Brother   . Prostate cancer Other   . Prostate cancer Other   .  Lung cancer Sister     Social History   Tobacco Use  . Smoking status: Former Smoker    Packs/day: 0.50    Years: 18.00    Pack years: 9.00    Types: Cigarettes    Quit date: 05/02/1974    Years since quitting: 46.2  . Smokeless tobacco: Never Used  Vaping Use  . Vaping Use: Never used  Substance Use Topics  . Alcohol use: No  . Drug use: No    Home Medications Prior to Admission medications   Medication Sig Start Date End Date Taking? Authorizing Provider  acetaminophen (TYLENOL) 500 MG tablet Take 1,000 mg by mouth every 6 (six) hours as needed for headache (pain).     [provider]  clopidogrel (PLAVIX) 75 MG tablet Take 75 mg by mouth daily.     [provider]  Colchicine (MITIGARE) 0.6 MG CAPS Take  0.6 mg by mouth 2 (two) times daily. 05/14/20   Cheryln Manly, NP  diazepam (VALIUM) 5 MG tablet Take 5 mg by mouth every 8 (eight) hours as needed (dizziness).     [provider]  fluticasone (FLONASE) 50 MCG/ACT nasal spray Place 2 sprays into both nostrils daily. 03/18/19   Fawze, Mina A, PA-C  ibuprofen (ADVIL) 400 MG tablet Take 1 tablet (400 mg total) by mouth 2 (two) times daily. Patient not taking: Reported on 06/18/2020 05/14/20   Reino Bellis B, NP  levETIRAcetam (KEPPRA) 250 MG tablet Take 375 mg by mouth 3 (three) times daily. Take 125 mg( 1/2 tab) po TID x 3 weeks, increase to 250 mg(1 tab) po TID for 3 weeks, increase to 375 mg TID  03/20/20   [provider]  lisinopril-hydrochlorothiazide (PRINZIDE,ZESTORETIC) 20-12.5 MG tablet Take 0.5 tablets by mouth every morning.     [provider]  loratadine (CLARITIN) 10 MG tablet Take 10 mg by mouth every morning.     [provider]  Melatonin 5 MG TABS Take 5 mg by mouth at bedtime.  Patient not taking: Reported on 06/18/2020    [provider]  mirtazapine (REMERON) 7.5 MG tablet Take 1 tablet (7.5 mg total) by mouth at bedtime. 07/05/20   Truitt Merle, MD  OVER THE COUNTER MEDICATION Place 1 drop into both eyes daily as needed (dry eyes). Over the counter lubricating eye drop     [provider]  pantoprazole (PROTONIX) 40 MG tablet Take 1 tablet (40 mg total) by mouth daily. 05/15/20   Cheryln Manly, NP  pravastatin (PRAVACHOL) 40 MG tablet Take 40 mg by mouth every morning.     [provider]  SYMBICORT 160-4.5 MCG/ACT inhaler Inhale 1-2 puffs into the lungs as needed. 05/31/20   [provider]  tamsulosin (FLOMAX) 0.4 MG CAPS capsule Take 0.4 mg by mouth at bedtime.  12/29/18   [provider]  traMADol (ULTRAM) 50 MG tablet Take 50-100 mg by mouth every 6 (six) hours as needed for moderate pain or severe pain.     [provider]     Allergies    Aspirin and Simvastatin  Review of Systems   Review of Systems  Constitutional: Negative for chills and fever.  HENT: Negative for ear pain and sore throat.   Eyes: Negative for pain and visual disturbance.  Respiratory: Negative for cough and shortness of breath.   Cardiovascular: Negative for chest pain and palpitations.  Gastrointestinal: Positive for abdominal pain. Negative for anorexia, diarrhea and vomiting.  Genitourinary:  Negative for dysuria and hematuria.  Musculoskeletal: Negative for arthralgias and back pain.  Skin: Negative for color change and rash.  Neurological: Negative for seizures and syncope.  All other systems reviewed and are negative.   Physical Exam Updated Vital Signs BP 131/67 (BP Location: Right Arm)   Pulse 78   Temp 98.8 F (37.1 C) (Oral)   Resp 18   Ht 5\' 8"  (1.727 m)   Wt 69.9 kg   SpO2 100%   BMI 23.45 kg/m   Physical Exam Vitals and nursing note reviewed.  Constitutional:      General: He is not in acute distress.    Appearance: He is well-developed. He is not ill-appearing.  HENT:     Head: Normocephalic and atraumatic.  Eyes:     Extraocular Movements: Extraocular movements intact.     Conjunctiva/sclera: Conjunctivae normal.     Pupils: Pupils are equal, round, and reactive to light.  Cardiovascular:     Rate and Rhythm: Normal rate and regular rhythm.     Heart sounds: Normal heart sounds. No murmur heard.   Pulmonary:     Effort: Pulmonary effort is normal. No respiratory distress.     Breath sounds: Normal breath sounds.  Abdominal:     General: Abdomen is flat. There is no distension.     Palpations: Abdomen is soft.     Tenderness: There is generalized abdominal tenderness.  Musculoskeletal:     Cervical back: Neck supple.  Skin:    General: Skin is warm and dry.     Capillary Refill: Capillary refill takes less than 2 seconds.  Neurological:     General: No focal deficit present.     Mental  Status: He is alert.  Psychiatric:        Mood and Affect: Mood normal.     ED Results / Procedures / Treatments   Labs (all labs ordered are listed, but only abnormal results are displayed) Labs Reviewed  COMPREHENSIVE METABOLIC PANEL - Abnormal; Notable for the following components:      Result Value   Glucose, Bld 125 (*)    All other components within normal limits  CBC - Abnormal; Notable for the following components:   RBC 4.07 (*)    Hemoglobin 12.0 (*)    HCT 37.7 (*)    All other components within normal limits  URINALYSIS, ROUTINE W REFLEX MICROSCOPIC - Abnormal; Notable for the following components:   pH 8.5 (*)    Protein, ur 30 (*)    All other components within normal limits  URINALYSIS, MICROSCOPIC (REFLEX) - Abnormal; Notable for the following components:   Bacteria, UA RARE (*)    All other components within normal limits  LIPASE, BLOOD    EKG None  Radiology CT ABDOMEN PELVIS W CONTRAST  Result Date: 07/31/2020 CLINICAL DATA:  Chronic abdominal pain for approximately 2 years. Metastatic neuroendocrine tumor. EXAM: CT ABDOMEN AND PELVIS WITH CONTRAST TECHNIQUE: Multidetector CT imaging of the abdomen and pelvis was performed using the standard protocol following bolus administration of intravenous contrast. CONTRAST:  146mL OMNIPAQUE IOHEXOL 300 MG/ML  SOLN COMPARISON:  03/01/2020 from [facility] FINDINGS: Lower Chest: No acute findings. Hepatobiliary: 2 cm low-attenuation lesion is seen in the inferior right hepatic lobe, without significant change since prior exam. No other liver lesions identified by CT. Gallbladder is unremarkable. No evidence of biliary ductal dilatation. Pancreas:  No mass or inflammatory changes. Spleen: Within normal limits in size and appearance. Adrenals/Urinary Tract:  No masses identified. Small cyst again seen in lower pole of right kidney. No evidence of ureteral calculi or hydronephrosis. Stomach/Bowel: No evidence  of obstruction, inflammatory process or abnormal fluid collections. Vascular/Lymphatic: No pathologically enlarged lymph nodes. No abdominal aortic aneurysm. Aortic atherosclerotic calcification noted. Reproductive: No mass or other significant abnormality. Fiducial markers again seen in the prostate. Other:  None. Musculoskeletal:  No suspicious bone lesions identified. IMPRESSION: No acute findings. Stable 2 cm lesion in inferior right hepatic lobe, consistent with known metastatic neuroendocrine tumor. Aortic Atherosclerosis (ICD10-I70.0). Electronically Signed   By: Marlaine Hind M.D.   On: 07/31/2020 19:55    Procedures Procedures (including critical care time)  Medications Ordered in ED Medications  iohexol (OMNIPAQUE) 300 MG/ML solution 100 mL (100 mLs Intravenous Contrast Given 07/31/20 1919)    ED Course  I have reviewed the triage vital signs and the nursing notes.  Pertinent labs & imaging results that were available during my care of the patient were reviewed by me and considered in my medical decision making (see chart for details).    MDM Rules/Calculators/A&P                          JIONNI HELMING is a 74 year old male with history of high cholesterol, neuroendocrine tumor of the liver who presents to the ED with abdominal pain.  Normal vitals.  No fever.  On and off pain for the last 2 years worse over the last several days.  Has constipation sometimes but is having normal bowel movements.  Normal labs.  No urinary tract infection.  CT scan shows stable neuroendocrine tumor in the liver.  No ascites.  No other bowel obstruction or appendicitis.  No severe constipation.  Overall likely acute on chronic pain.  Recommend continued use of MiraLAX, Tylenol, Motrin.  Will refer him back to GI as he used to follow with [facility].  May need a colonoscopy or endoscopy.  Discharged from ED in good condition.  Understands return precautions.  This chart was dictated using voice  recognition software.  Despite best efforts to proofread,  errors can occur which can change the documentation meaning.    Final Clinical Impression(s) / ED Diagnoses Final diagnoses:  Generalized abdominal pain    Rx / DC Orders ED Discharge Orders         Ordered    Ambulatory referral to Gastroenterology        07/31/20 1928           Lennice Sites, DO 07/31/20 2011

## 2020-08-03 DIAGNOSIS — R109 Unspecified abdominal pain: Secondary | ICD-10-CM | POA: Diagnosis not present

## 2020-08-03 DIAGNOSIS — K5900 Constipation, unspecified: Secondary | ICD-10-CM | POA: Diagnosis not present

## 2020-08-29 ENCOUNTER — Other Ambulatory Visit: Payer: Self-pay | Admitting: Cardiology

## 2020-09-05 ENCOUNTER — Encounter: Payer: Self-pay | Admitting: Physician Assistant

## 2020-09-05 NOTE — Progress Notes (Unsigned)
Cardiology Office Note    Date:  09/07/2020   ID:  Ryan Wilkerson, DOB 01-06-1946, MRN [id number]  PCP:  Asencion Gowda.August Saucer, MD  Cardiologist:  Verne Carrow, MD  Electrophysiologist:  None   Chief Complaint: f/u pericardial effusion  History of Present Illness:   Ryan Wilkerson is a 75 y.o. male with history of metastatic neuroendocrine tumor of the liver, HTN, HLD (followed by primary care), prior CVA on Plavix (not for cardiac reasons), tobacco abuse, mild anemia by labs, and pericarditis who presents for post-hospital follow-up.   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 From cardiac standpoint he was admitted 05/12/20 with chest pain and hypotension. Initial EKG had concerning ST changes inferiorly. Bedside echo with preserved LV function and small circumferential pericardial effusion. CTA performed given differential BP in upper extremities and nature of pain, which did not show dissection. 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 He subsequently underwent cath showing normal coronaries. hsTroponin was 30 and CRP 19. Symptoms were felt c/w acute pericarditis, started on colchicine 0.6mg  BID x 3 months and ibuprofen 400mg  BID to complete 5 additional days. Full echo had shown EF 50-55%, small pericardial effusion, mild-moderate TR. He did follow up with the cancer center on 05/21/20 for increased SOB/wheezing and was given an albuterol inhaler with improvement in symptoms. D-dimer elevated but no acute workup pursued at that time. F/u echo after discharge on 06/04/20 showed EF 55-60%, grade 1 DD, mild TR, no RV dysfunction to suggest PE, no pericardial effusion. Due to weakness and weight loss, his colchicine dose was decreased to once daily.   He is seen back in follow-up today doing well. He reports his symptoms had fully resolved in October and he has done well since then without any  cardiac symptoms. No CP, SOB, lightheadedness, chest pain. Ibuprofen is still listed on med list but he affirms to me he is no longer on this (took the course as instructed in the fall). He is still taking colchicine once daily. He has begun to regain his weight back with improved appetite and will be seeing oncology in follow-up to discuss next steps.   Labwork independently reviewed: 07/2020 Hgb 12.0, K 3.9, Cr 1.10, LFTs wnl, lipase 21 (ED visit for abd pain) 05/21/20 Hgb 11.1, K 3.8, Cr 1.24, Mg 1.8, Covid negative, LDL 71    Past Medical History:  Diagnosis Date  . Anemia   . CVA (cerebral vascular accident) (HCC)   . Erectile dysfunction   . Essential tremor   . Family history of breast cancer   . Family history of prostate cancer   . Family history of stomach cancer   . GERD (gastroesophageal reflux disease)   . History of colon polyps   . Hypercholesterolemia   . Hypertension   . Metastatic malignant neuroendocrine tumor to liver (HCC)   . Nocturia   . Pericardial effusion   . Pericarditis   . Prostate cancer [facility]) urologist-- dr [facility] /  oncologist-- dr manning   prostate bx 06-24-2016 and 09-15-2017 at dr 09-17-2017 office  Stage T1c, Gleason 4+3, PSA 8.48, vol 33cc---  planned external beam radiation therpay  . Tobacco abuse   . Urgency of urination   . Wears glasses     Past Surgical History:  Procedure Laterality Date  . COLONOSCOPY  last one 06/ 2018  . GOLD SEED IMPLANT N/A 12/23/2017   Procedure: GOLD SEED IMPLANT;  Surgeon:  Alexis Frock, MD;  Location: [facility];  Service: Urology;  Laterality: N/A;  . LEFT HEART CATH AND CORONARY ANGIOGRAPHY N/A 05/12/2020   Procedure: LEFT HEART CATH AND CORONARY ANGIOGRAPHY;  Surgeon: Burnell Blanks, MD;  Location: [facility];  Service: Cardiovascular;  Laterality: N/A;  . PROSTATE BIOPSY  06-24-2016;  09-15-2017-- at dr Tresa Moore office  . SPACE OAR INSTILLATION N/A 12/23/2017   Procedure: SPACE  OAR INSTILLATION;  Surgeon: Alexis Frock, MD;  Location: [facility];  Service: Urology;  Laterality: N/A;    Current Medications: Current Meds  Medication Sig  . acetaminophen (TYLENOL) 500 MG tablet Take 1,000 mg by mouth every 6 (six) hours as needed for headache (pain).   . budesonide-formoterol (SYMBICORT) 160-4.5 MCG/ACT inhaler 2 puffs  . clopidogrel (PLAVIX) 75 MG tablet Take 75 mg by mouth daily.   . diazepam (VALIUM) 5 MG tablet Take 5 mg by mouth every 8 (eight) hours as needed (dizziness).   . fluticasone (FLONASE) 50 MCG/ACT nasal spray Place 2 sprays into both nostrils daily.  Marland Kitchen ibuprofen (ADVIL) 400 MG tablet Take 1 tablet (400 mg total) by mouth 2 (two) times daily.  Marland Kitchen lisinopril-hydrochlorothiazide (PRINZIDE,ZESTORETIC) 20-12.5 MG tablet Take 0.5 tablets by mouth every morning.   . loratadine (CLARITIN) 10 MG tablet Take 10 mg by mouth every morning.   . Melatonin 5 MG TABS Take 5 mg by mouth at bedtime.  . mirtazapine (REMERON) 7.5 MG tablet Take 1 tablet (7.5 mg total) by mouth at bedtime.  Marland Kitchen MITIGARE 0.6 MG CAPS TAKE 1 CAPSULE(0.6MG ) BY MOUTH DAILY  . OVER THE COUNTER MEDICATION Place 1 drop into both eyes daily as needed (dry eyes). Over the counter lubricating eye drop   . pantoprazole (PROTONIX) 40 MG tablet Take 1 tablet (40 mg total) by mouth daily.  . polyethylene glycol powder (GLYCOLAX/MIRALAX) 17 GM/SCOOP powder 1 dose  . pravastatin (PRAVACHOL) 40 MG tablet Take 40 mg by mouth every morning.   . SYMBICORT 160-4.5 MCG/ACT inhaler Inhale 1-2 puffs into the lungs as needed.  . tamsulosin (FLOMAX) 0.4 MG CAPS capsule Take 0.4 mg by mouth at bedtime.   . traMADol (ULTRAM) 50 MG tablet Take 50-100 mg by mouth every 6 (six) hours as needed for moderate pain or severe pain.       Allergies:   Aspirin and Simvastatin   Social History   Socioeconomic History  . Marital status: Married    Spouse name: Not on file  . Number of children: 4  .  Years of education: Not on file  . Highest education level: Not on file  Occupational History  . Occupation: Retired    Comment: Automotive engineer  Tobacco Use  . Smoking status: Former Smoker    Packs/day: 0.50    Years: 18.00    Pack years: 9.00    Types: Cigarettes    Quit date: 05/02/1974    Years since quitting: 46.3  . Smokeless tobacco: Never Used  Vaping Use  . Vaping Use: Never used  Substance and Sexual Activity  . Alcohol use: No  . Drug use: No  . Sexual activity: Yes  Other Topics Concern  . Not on file  Social History Narrative   Married with 3 daughters, 1 son. 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   Social Determinants of Health   Financial Resource Strain: Not on file  Food Insecurity: Not on file  Transportation Needs: Not on file  Physical Activity: Not on file  Stress: Not on file  Social Connections: Not on file     Family History:  The patient's family history includes Breast cancer (age of onset: 62) in his sister; Lung cancer in his brother and sister; Prostate cancer in his brother, brother, brother, brother, brother and other family members; Prostate cancer (age of onset: 31) in his brother.  ROS:   Please see the history of present illness.  All other systems are reviewed and otherwise negative.    EKGs/Labs/Other Studies Reviewed:    Studies reviewed are outlined and summarized above. Reports included below if pertinent.  2D echo 06/04/20  1. Left ventricular ejection fraction, by estimation, is 55 to 60%. The  left ventricle has normal function. The left ventricle has no regional  wall motion abnormalities. There is mild concentric left ventricular  hypertrophy. Left ventricular diastolic  parameters are consistent with Grade I diastolic dysfunction (impaired  relaxation).  2. Right ventricular systolic function is normal. The right ventricular   size is normal. There is normal pulmonary artery systolic pressure. The  estimated right ventricular systolic pressure is [id number] mmHg.  3. The mitral valve is normal in structure. No evidence of mitral valve  regurgitation. No evidence of mitral stenosis.  4. The aortic valve is normal in structure. Aortic valve regurgitation is  not visualized. No aortic stenosis is present.  5. The inferior vena cava is normal in size with greater than 50%  respiratory variability, suggesting right atrial pressure of 3 mmHg.   2D echo 05/12/20   1. Small pericardial effusion no evidence of tamponade.  2. Abnormal septal motion . Left ventricular ejection fraction, by  estimation, is 50 to 55%. The left ventricle has low normal function. The  left ventricle has no regional wall motion abnormalities. Left ventricular  diastolic parameters were normal.  3. Right ventricular systolic function is normal. The right ventricular  size is normal. There is normal pulmonary artery systolic pressure.  4. A small pericardial effusion is present.  5. The mitral valve is normal in structure. No evidence of mitral valve  regurgitation. No evidence of mitral stenosis.  6. Tricuspid valve regurgitation is mild to moderate.  7. The aortic valve is tricuspid. Aortic valve regurgitation is not  visualized. Mild aortic valve sclerosis is present, with no evidence of  aortic valve stenosis.  8. The inferior vena cava is dilated in size with >50% respiratory  variability, suggesting right atrial pressure of 8 mmHg.   LHC 05/12/20 No angiographic evidence of CAD  Recommendations: Pt with ongoing chest pain but normal coronary arteries. No aortic dissection on chest CTA. Suspect pericarditis. Will admit to the ICU and attempt pain control with narcotics. Echo in the am. Will start colchicine.       EKG:  EKG is not ordered today  Recent Labs: 05/13/2020: Magnesium 1.8 07/31/2020: ALT 42; BUN 14;  Creatinine, Ser 1.10; Hemoglobin 12.0; Platelets 241; Potassium 3.9; Sodium 135  Recent Lipid Panel    Component Value Date/Time   CHOL 129 05/12/2020 0106   TRIG 50 05/12/2020 0106   HDL 48 05/12/2020 0106   CHOLHDL 2.7 05/12/2020 0106   VLDL 10 05/12/2020 0106   LDLCALC 71 05/12/2020 0106    PHYSICAL EXAM:    VS:  BP 140/70 (BP Location: Left Arm, Patient Position: Sitting, Cuff Size: Normal)   Pulse 94   Ht 5\' 8"  (1.727  m)   Wt 156 lb (70.8 kg)   SpO2 100% Comment: at rest  BMI 23.72 kg/m   BMI: Body mass index is 23.72 kg/m.  GEN: Well nourished, well developed AAM, in no acute distress HEENT: normocephalic, atraumatic Neck: no JVD, carotid bruits, or masses Cardiac: RRR; no murmurs, rubs, or gallops, no edema  Respiratory:  clear to auscultation bilaterally, normal work of breathing GI: soft, nontender, nondistended, + BS MS: no deformity or atrophy Skin: warm and dry, no rash Neuro:  Alert and Oriented x 3, Strength and sensation are intact, follows commands Psych: euthymic mood, full affect  Wt Readings from Last 3 Encounters:  09/07/20 156 lb (70.8 kg)  07/31/20 154 lb 3.2 oz (69.9 kg)  07/05/20 148 lb 9.6 oz (67.4 kg)     ASSESSMENT & PLAN:   1. History of pericarditis with pericardial effusion - resolved. He completed his course of ibuprofen several months ago. We are also now 4 months out from starting colchicine so OK to stop. Discussed surveillance for recurrent symptoms.  2. Essential HTN - Suboptimal blood pressure control noted today. He reports excellent control at home. We need a better idea of what it's running at home. The patient was provided instructions on monitoring blood pressure at home for 1 week and relaying results to our office. Consideration could be given to changing his lisinopril/HCTZ to 1 whole tablet daily.  Disposition: F/u with Dr. Angelena Form in 6 months.  Medication Adjustments/Labs and Tests Ordered: Current medicines are reviewed  at length with the patient today.  Concerns regarding medicines are outlined above. Medication changes, Labs and Tests ordered today are summarized above and listed in the Patient Instructions accessible in Encounters.   Signed, Charlie Pitter, PA-C  09/07/2020 10:07 AM    [address], [place], [place]  [address] Phone: [phone]; Fax: [phone]

## 2020-09-07 ENCOUNTER — Encounter: Payer: Self-pay | Admitting: Physician Assistant

## 2020-09-07 ENCOUNTER — Other Ambulatory Visit: Payer: Self-pay

## 2020-09-07 ENCOUNTER — Ambulatory Visit: Payer: Medicare HMO | Admitting: Physician Assistant

## 2020-09-07 VITALS — BP 140/70 | HR 94 | Ht 68.0 in | Wt 156.0 lb

## 2020-09-07 DIAGNOSIS — I10 Essential (primary) hypertension: Secondary | ICD-10-CM

## 2020-09-07 DIAGNOSIS — I313 Pericardial effusion (noninflammatory): Secondary | ICD-10-CM

## 2020-09-07 DIAGNOSIS — Z8679 Personal history of other diseases of the circulatory system: Secondary | ICD-10-CM | POA: Diagnosis not present

## 2020-09-07 DIAGNOSIS — I3139 Other pericardial effusion (noninflammatory): Secondary | ICD-10-CM

## 2020-09-07 NOTE — Patient Instructions (Addendum)
Medication Instructions:  Your physician has recommended you make the following change in your medication:  1.  STOP Ibupfrofen 2.  STOP Colchicine   *If you need a refill on your cardiac medications before your next appointment, please call your pharmacy*   Lab Work: None ordered  If you have labs (blood work) drawn today and your tests are completely normal, you will receive your results only by: Marland Kitchen MyChart Message (if you have MyChart) OR . A paper copy in the mail If you have any lab test that is abnormal or we need to change your treatment, we will call you to review the results.   Testing/Procedures: None ordered   Follow-Up: At [facility], you and your health needs are our priority.  As part of our continuing mission to provide you with exceptional heart care, we have created designated Provider Care Teams.  These Care Teams include your primary Cardiologist (physician) and Advanced Practice Providers (APPs -  Physician Assistants and Nurse Practitioners) who all work together to provide you with the care you need, when you need it.  We recommend signing up for the patient portal called "MyChart".  Sign up information is provided on this After Visit Summary.  MyChart is used to connect with patients for Virtual Visits (Telemedicine).  Patients are able to view lab/test results, encounter notes, upcoming appointments, etc.  Non-urgent messages can be sent to your provider as well.   To learn more about what you can do with MyChart, go to [email or web address].    Your next appointment:   6 month(s)  The format for your next appointment:   In Person  Provider:   You may see Lauree Chandler, MD or one of the following Advanced Practice Providers on your designated Care Team:    Melina Copa, PA-C  Ermalinda Barrios, PA-C    Other Instructions  We need a better idea of what your blood pressure is running at home. I would recommend using a blood pressure cuff that  goes on your arm. The wrist ones can be inaccurate. If possible, try to select one that also reports your heart rate. To check your blood pressure, choose a time at least 3 hours after taking your blood pressure medicines. If you can sample it at different times of the day, that's great - it might give you more information about how your blood pressure fluctuates. Remain seated in a chair for 5 minutes quietly beforehand, then check it. Please record a list of those readings and call us/send in MyChart message with them for our review in 1 week.

## 2020-10-01 NOTE — Progress Notes (Signed)
[facility]   [phone] [phone]   Clinic Follow up Note   Patient Care Team: Alroy Dust, L.Marlou Sa, MD as PCP - General (Family Medicine) Burnell Blanks, MD as PCP - Cardiology (Cardiology) Jonnie Finner, RN as Oncology Nurse Navigator Truitt Merle, MD as Consulting Physician (Oncology) Alla Feeling, NP as Nurse Practitioner (Nurse Practitioner) Alexis Frock, MD as Consulting Physician (Urology) Tyler Pita, MD as Consulting Physician (Radiation Oncology)  Date of Service:  10/04/2020  CHIEF COMPLAINT: Follow-up metastatic neuroendocrine tumor  SUMMARY OF ONCOLOGIC HISTORY: Oncology History Overview Note  Cancer Staging No matching staging information was found for the patient.    Malignant neoplasm of prostate ([place])  08/31/2017 Initial Diagnosis   Malignant neoplasm of prostate ([place])   10/28/2017 Genetic Testing   The patient had genetic testing due to a personal history of prostate cancer and a family history of breast and stomach cancer.  The Common Hereditary Cancers Panel + Prostate Cancer Panel was ordered.  APC, ATM, AXIN2, BARD1, BMPR1A, BRCA1, BRCA2, BRIP1, CDH1, CDK4, CDKN2A (p14ARF), CDKN2A (p16INK4a), CHEK2, CTNNA1, DICER1, EPCAM*, FANCA, GREM1*, KIT, MEN1, MLH1, MSH2, MSH3, MSH6, MUTYH, NBN, NF1, PALB2, PDGFRA, PMS2, POLD1, POLE, PTEN, RAD50, RAD51C, RAD51D, SDHB, SDHC, SDHD, SMAD4, SMARCA4, STK11, TP53, TSC1, TSC2, VHL. The following genes were evaluated for sequence changes only: HOXB13*, NTHL1*, SDHA.   Results: Negative, no pathogenic variants identified. The date of this test report is 10/28/2017.    01/05/2018 - 02/12/2018 Radiation Therapy   S/p radiation to the prostate 70 Gy in 28 fractions from 01/05/2018-02/12/2018 per Dr. 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 2.5 x 2.0 x 3.0 cm soft tissue lesion in the central small bowel mesentery with  associated dystrophic calcification and retraction of adjacent small bowel loops features highly suspicious for metastatic carcinoid tumor.  Upper normal 9 mm short axis lymph node in the adjacent mesentery 2. 2.  1.6 x 1.4 cm heterogeneous, poorly defined lesion in the inferior right liver suspicious for metastatic disease, the differential includes GI primary or metastatic prostate cancer 3. No retroperitoneal or pelvic sidewall lymphadenopathy   04/01/2020 PET scan   IMPRESSION: 1. Central mesenteric mass with intense radiotracer activity consistent well differentiated neuroendocrine tumor 2. 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 3. Small peritoneal/nodal metastasis along the LEFT iliac vessels. 4. Very small lesion in the pancreas with intermediate activity could represent primary lesion. No primary bowel lesion identified. 5. Small metastatic implant within the pericardium adjacent LEFT heart ventricle. 6. No skeletal metastasis.   04/30/2020 Pathology Results   FINAL MICROSCOPIC DIAGNOSIS:  A. 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  Tumor cells are negative  for TTF-1.  The findings are consistent with neuroendocrine tumor of  gastrointestinal origin.  Ki-67 stain shows a proliferative index of  about 1%, consistent with well-differentiated, grade 1 tumor.    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 Held after cycle 1 due to suspected pericarditis and deconditioning.        CURRENT THERAPY:  First-line octreotide monthly starting 05/10/20, held after cycle 1  INTERVAL HISTORY:  Ryan Wilkerson is here for a follow up. He presents to  the clinic alone. Overall doing well, occasional mild pain  Mild constipation  appetite is good, weight stable Energy is better, walks more and able to do house work Flushing is less, a few times a week now  Mild intermittent dizziness when gets up. He has been following with cardiology    All other systems were reviewed with the patient and are negative.  MEDICAL HISTORY:  Past Medical History:  Diagnosis Date  . Anemia   . CVA (cerebral vascular accident) ([place])   . Erectile dysfunction   . Essential tremor   . Family history of breast cancer   . Family history of prostate cancer   . Family history of stomach cancer   . GERD (gastroesophageal reflux disease)   . History of colon polyps   . Hypercholesterolemia   . Hypertension   . 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 Nocturia   . Pericardial effusion   . Pericarditis   . Prostate cancer [facility]) urologist-- dr Tresa Moore /  oncologist-- dr manning   prostate bx 06-24-2016 and 09-15-2017 at dr Tresa Moore office  Stage T1c, Gleason 4+3, PSA 8.48, vol 33cc---  planned external beam radiation therpay  . Tobacco abuse   . Urgency of urination   . Wears glasses     SURGICAL HISTORY: Past Surgical History:  Procedure Laterality Date  . COLONOSCOPY  last one 06/ 2018  . GOLD SEED IMPLANT N/A 12/23/2017   Procedure: GOLD SEED IMPLANT;  Surgeon: Alexis Frock, MD;  Location: [facility];  Service: Urology;  Laterality: N/A;  . LEFT HEART CATH AND CORONARY ANGIOGRAPHY N/A 05/12/2020   Procedure: LEFT HEART CATH AND CORONARY ANGIOGRAPHY;  Surgeon: Burnell Blanks, MD;  Location: [facility];  Service: Cardiovascular;  Laterality: N/A;  . PROSTATE BIOPSY  06-24-2016;  09-15-2017-- at dr Tresa Moore office  . SPACE OAR INSTILLATION N/A 12/23/2017   Procedure: SPACE OAR INSTILLATION;  Surgeon: Alexis Frock, MD;  Location: [facility];  Service: Urology;  Laterality: N/A;    I have  reviewed the social history and family history with the patient and they are unchanged from previous note.  ALLERGIES:  is allergic to aspirin and simvastatin.  MEDICATIONS:  Current Outpatient Medications  Medication Sig Dispense Refill  . acetaminophen (TYLENOL) 500 MG tablet Take 1,000 mg by mouth every 6 (six) hours as needed for headache (pain).     . budesonide-formoterol (SYMBICORT) 160-4.5 MCG/ACT inhaler 2 puffs    . clopidogrel (PLAVIX) 75 MG tablet Take 75 mg by mouth daily.     . fluticasone (FLONASE) 50 MCG/ACT nasal spray Place 2 sprays into both nostrils daily. 16 g 0  . lisinopril-hydrochlorothiazide (PRINZIDE,ZESTORETIC) 20-12.5 MG tablet Take 0.5 tablets by mouth every morning.     . loratadine (CLARITIN) 10 MG tablet Take 10 mg by mouth every morning.     Marland Kitchen OVER THE COUNTER MEDICATION Place 1 drop into both eyes daily as needed (dry eyes). Over the counter lubricating eye drop     . pantoprazole (PROTONIX) 40 MG tablet Take 1 tablet (40 mg total) by mouth daily. 30 tablet 0  . polyethylene glycol powder (GLYCOLAX/MIRALAX) 17 GM/SCOOP powder 1 dose    . pravastatin (PRAVACHOL) 40 MG tablet Take 40 mg by mouth every morning.     . SYMBICORT 160-4.5 MCG/ACT inhaler Inhale 1-2 puffs into the lungs as needed.    . traMADol (ULTRAM) 50 MG tablet Take 50-100 mg by mouth every  6 (six) hours as needed for moderate pain or severe pain.      No current facility-administered medications for this visit.    PHYSICAL EXAMINATION: ECOG PERFORMANCE STATUS: 1 - Symptomatic but completely ambulatory  Vitals:   10/04/20 1117  BP: (!) 144/72  Pulse: 89  Resp: 14  Temp: 97.6 F (36.4 C)  SpO2: 100%   Filed Weights   10/04/20 1117  Weight: 156 lb 3.2 oz (70.9 kg)    GENERAL:alert, no distress and comfortable SKIN: skin color, texture, turgor are normal, no rashes or significant lesions EYES: normal, Conjunctiva are pink and non-injected, sclera clear NECK: supple, thyroid  normal size, non-tender, without nodularity LYMPH:  no palpable lymphadenopathy in the cervical, axillary  LUNGS: clear to auscultation and percussion with normal breathing effort HEART: regular rate & rhythm and no murmurs and no lower extremity edema ABDOMEN:abdomen soft, non-tender and normal bowel sounds Musculoskeletal:no cyanosis of digits and no clubbing  NEURO: alert & oriented x 3 with fluent speech, no focal motor/sensory deficits  LABORATORY DATA:  I have reviewed the data as listed CBC Latest Ref Rng & Units 10/04/2020 07/31/2020 06/06/2020  WBC 4.0 - 10.5 K/uL 4.9 8.9 7.1  Hemoglobin 13.0 - 17.0 g/dL 12.7(L) 12.0(L) 12.5(L)  Hematocrit 39.0 - 52.0 % 39.0 37.7(L) 39.0  Platelets 150 - 400 K/uL 188 241 262     CMP Latest Ref Rng & Units 10/04/2020 07/31/2020 06/06/2020  Glucose 70 - 99 mg/dL 88 125(H) 97  BUN 8 - 23 mg/dL _0 Creatinine 0.61 - 1.24 mg/dL 1.24 1.10 1.25(H)  Sodium 135 - 145 mmol/L 141 135 137  Potassium 3.5 - 5.1 mmol/L 4.1 3.9 3.5  Chloride 98 - 111 mmol/L 105 98 102  CO2 22 - 32 mmol/L _1 Calcium 8.9 - 10.3 mg/dL 9.2 9.3 9.5  Total Protein 6.5 - 8.1 g/dL 7.1 7.7 7.6  Total Bilirubin 0.3 - 1.2 mg/dL 0.6 0.6 0.7  Alkaline Phos 38 - 126 U/L 57 52 80  AST 15 - 41 U/L 27 34 26  ALT 0 - 44 U/L 30 42 47(H)      RADIOGRAPHIC STUDIES: I have personally reviewed the radiological images as listed and agreed with the findings in the report. No results found.   ASSESSMENT & PLAN:  Ryan Wilkerson is a 75 y.o. male with   1.Well differentiated/G1 neuroendocrine tumor, consistent with GI primary with liver and nodal metastasis -Due to rising PSA, CT/bone scan were obtained showing a mesenteric mass and single liver lesion. We do not have previous CT to compare.PET dotatate on 04/09/2020 showed intense uptake in the central mesenteric mass with 3 well differentiated liver metastases and small peritoneal/nodal metastatic deposits along the left iliac  vessels. There is no primary bowel lesion identified. There is a very small lesion in the pancreas with indeterminate activity.  The primary site was still undetermined -Chromogranin A on 8/2 is normal -His 04/30/20 liver biopsy showedmetastatic well differentiated/G1 neuroendocrine tumor to the liver. IHC stains positive for synaptophysin, chromogranin, CD56 and CDX2 and negative for TTF-1-1. Overall consistent with a GI primary. Ki-67 of 1%.  -Outpatient MRI of the abdomen ruled out pancreas as the primary site on 05/16/20  -His case was discussed in tumor board, unfortunately this is metastatic disease and surgical resection is not an option.  -He began first line Sandostatin 20 mg on 05/10/20. However on 05/12/20 he had pericarditis. Treatment has been held since.  -he has  recovered wel from pericarditis, he is awaiting to restart Sandostatin injection, I will change it to Lanreotide 175m every 4 weeks starting in 2 weeks -lab reviewed  -f/u in 6 weeks   2.  History of pericarditis  -Received first octreotide on 05/10/20, on 9/11 he developed acute chest pain, nausea, diaphoresis hoarseness -ED 05/12/2020 found to have inferior STE, echo showed preserved LV function and small pericardial effusion -CTA negative for aortic dissection -urgent cath showed normal coronaries -Admitted and treated for suspected acute pericarditis with colchicine 0.6 p.o. twice daily x3 months and ibuprofen.  He remained stable and was discharged home on 05/14/2020.  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 MTammi Klippel-PSA down to 1.8 on 08/2018, steady rise to 4.69 on 6/21 -CT/bone scan in 6/21 showed no obvious metastatic disease from prostate cancerbut did reveal mesenteric mass and liver lesionultimately biopsy-proven neuroendocrine tumor -Followed by Dr.  [facility],I will talk to Dr. MTresa Moore [place] to proceed prostate cancer treatment from my standpoint   4.Family history/genetics -He has personal and strong family history of cancers,including7 brotherswithprostate cancer, 1 of those has lung cancer also, a sister with lungcancerand another sister with breast cancer -His 10/28/17 genetic testing was negative for pathogenic mutation or VUS.  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   No orders of the defined types were placed in this encounter.  All questions were answered. The patient knows to call the clinic with any problems, questions or concerns. No barriers to learning was detected. The total time spent in the appointment was 30 minutes.     YTruitt Merle MD 10/04/2020   I, AJoslyn Devon am acting as scribe for YTruitt Merle MD.   I have reviewed the above documentation for accuracy and completeness, and I agree with the above.

## 2020-10-04 ENCOUNTER — Inpatient Hospital Stay: Payer: Medicare HMO | Admitting: Hematology

## 2020-10-04 ENCOUNTER — Other Ambulatory Visit: Payer: Self-pay

## 2020-10-04 ENCOUNTER — Encounter: Payer: Self-pay | Admitting: Hematology

## 2020-10-04 ENCOUNTER — Telehealth: Payer: Self-pay | Admitting: Hematology

## 2020-10-04 ENCOUNTER — Inpatient Hospital Stay: Payer: Medicare HMO | Attending: Nurse Practitioner

## 2020-10-04 VITALS — BP 144/72 | HR 89 | Temp 97.6°F | Resp 14 | Ht 68.0 in | Wt 156.2 lb

## 2020-10-04 DIAGNOSIS — C7B02 Secondary carcinoid tumors of liver: Secondary | ICD-10-CM | POA: Diagnosis not present

## 2020-10-04 DIAGNOSIS — Z8349 Family history of other endocrine, nutritional and metabolic diseases: Secondary | ICD-10-CM | POA: Insufficient documentation

## 2020-10-04 DIAGNOSIS — K219 Gastro-esophageal reflux disease without esophagitis: Secondary | ICD-10-CM | POA: Diagnosis not present

## 2020-10-04 DIAGNOSIS — K5900 Constipation, unspecified: Secondary | ICD-10-CM | POA: Insufficient documentation

## 2020-10-04 DIAGNOSIS — Z801 Family history of malignant neoplasm of trachea, bronchus and lung: Secondary | ICD-10-CM | POA: Diagnosis not present

## 2020-10-04 DIAGNOSIS — K6389 Other specified diseases of intestine: Secondary | ICD-10-CM

## 2020-10-04 DIAGNOSIS — Z8249 Family history of ischemic heart disease and other diseases of the circulatory system: Secondary | ICD-10-CM | POA: Insufficient documentation

## 2020-10-04 DIAGNOSIS — I10 Essential (primary) hypertension: Secondary | ICD-10-CM | POA: Insufficient documentation

## 2020-10-04 DIAGNOSIS — Z7951 Long term (current) use of inhaled steroids: Secondary | ICD-10-CM | POA: Insufficient documentation

## 2020-10-04 DIAGNOSIS — C7B8 Other secondary neuroendocrine tumors: Secondary | ICD-10-CM

## 2020-10-04 DIAGNOSIS — Z79899 Other long term (current) drug therapy: Secondary | ICD-10-CM | POA: Insufficient documentation

## 2020-10-04 DIAGNOSIS — Z803 Family history of malignant neoplasm of breast: Secondary | ICD-10-CM | POA: Insufficient documentation

## 2020-10-04 DIAGNOSIS — Z800 Family history of malignant neoplasm of digestive organs: Secondary | ICD-10-CM | POA: Diagnosis not present

## 2020-10-04 DIAGNOSIS — Z8546 Personal history of malignant neoplasm of prostate: Secondary | ICD-10-CM | POA: Insufficient documentation

## 2020-10-04 DIAGNOSIS — Z923 Personal history of irradiation: Secondary | ICD-10-CM | POA: Insufficient documentation

## 2020-10-04 DIAGNOSIS — C7A00 Malignant carcinoid tumor of unspecified site: Secondary | ICD-10-CM | POA: Insufficient documentation

## 2020-10-04 LAB — CBC WITH DIFFERENTIAL (CANCER CENTER ONLY)
Abs Immature Granulocytes: 0.01 10*3/uL (ref 0.00–0.07)
Basophils Absolute: 0 10*3/uL (ref 0.0–0.1)
Basophils Relative: 1 %
Eosinophils Absolute: 0.2 10*3/uL (ref 0.0–0.5)
Eosinophils Relative: 4 %
HCT: 39 % (ref 39.0–52.0)
Hemoglobin: 12.7 g/dL — ABNORMAL LOW (ref 13.0–17.0)
Immature Granulocytes: 0 %
Lymphocytes Relative: 30 %
Lymphs Abs: 1.5 10*3/uL (ref 0.7–4.0)
MCH: 29.7 pg (ref 26.0–34.0)
MCHC: 32.6 g/dL (ref 30.0–36.0)
MCV: 91.1 fL (ref 80.0–100.0)
Monocytes Absolute: 0.4 10*3/uL (ref 0.1–1.0)
Monocytes Relative: 8 %
Neutro Abs: 2.8 10*3/uL (ref 1.7–7.7)
Neutrophils Relative %: 57 %
Platelet Count: 188 10*3/uL (ref 150–400)
RBC: 4.28 MIL/uL (ref 4.22–5.81)
RDW: 11.7 % (ref 11.5–15.5)
WBC Count: 4.9 10*3/uL (ref 4.0–10.5)
nRBC: 0 % (ref 0.0–0.2)

## 2020-10-04 LAB — CMP (CANCER CENTER ONLY)
ALT: 30 U/L (ref 0–44)
AST: 27 U/L (ref 15–41)
Albumin: 3.8 g/dL (ref 3.5–5.0)
Alkaline Phosphatase: 57 U/L (ref 38–126)
Anion gap: 6 (ref 5–15)
BUN: 10 mg/dL (ref 8–23)
CO2: 30 mmol/L (ref 22–32)
Calcium: 9.2 mg/dL (ref 8.9–10.3)
Chloride: 105 mmol/L (ref 98–111)
Creatinine: 1.24 mg/dL (ref 0.61–1.24)
GFR, Estimated: >60 mL/min (ref >60)
Glucose, Bld: 88 mg/dL (ref 70–99)
Potassium: 4.1 mmol/L (ref 3.5–5.1)
Sodium: 141 mmol/L (ref 135–145)
Total Bilirubin: 0.6 mg/dL (ref 0.3–1.2)
Total Protein: 7.1 g/dL (ref 6.5–8.1)

## 2020-10-04 NOTE — Telephone Encounter (Signed)
Scheduled appointments per 2/3 los. Spoke to patient who is aware of appointments dates and times.  

## 2020-10-08 LAB — CHROMOGRANIN A: Chromogranin A (ng/mL): 123 ng/mL — ABNORMAL HIGH (ref 0.0–101.8)

## 2020-10-18 ENCOUNTER — Other Ambulatory Visit: Payer: Self-pay

## 2020-10-18 ENCOUNTER — Inpatient Hospital Stay: Payer: Medicare HMO

## 2020-10-18 VITALS — BP 125/62 | HR 82 | Temp 98.8°F | Resp 16

## 2020-10-18 DIAGNOSIS — Z7951 Long term (current) use of inhaled steroids: Secondary | ICD-10-CM | POA: Diagnosis not present

## 2020-10-18 DIAGNOSIS — C7B8 Other secondary neuroendocrine tumors: Secondary | ICD-10-CM

## 2020-10-18 DIAGNOSIS — K219 Gastro-esophageal reflux disease without esophagitis: Secondary | ICD-10-CM | POA: Diagnosis not present

## 2020-10-18 DIAGNOSIS — Z923 Personal history of irradiation: Secondary | ICD-10-CM | POA: Diagnosis not present

## 2020-10-18 DIAGNOSIS — I10 Essential (primary) hypertension: Secondary | ICD-10-CM | POA: Diagnosis not present

## 2020-10-18 DIAGNOSIS — Z7189 Other specified counseling: Secondary | ICD-10-CM

## 2020-10-18 DIAGNOSIS — K5900 Constipation, unspecified: Secondary | ICD-10-CM | POA: Diagnosis not present

## 2020-10-18 DIAGNOSIS — C7A00 Malignant carcinoid tumor of unspecified site: Secondary | ICD-10-CM | POA: Diagnosis not present

## 2020-10-18 DIAGNOSIS — C7B02 Secondary carcinoid tumors of liver: Secondary | ICD-10-CM | POA: Diagnosis not present

## 2020-10-18 DIAGNOSIS — Z8546 Personal history of malignant neoplasm of prostate: Secondary | ICD-10-CM | POA: Diagnosis not present

## 2020-10-18 DIAGNOSIS — Z79899 Other long term (current) drug therapy: Secondary | ICD-10-CM | POA: Diagnosis not present

## 2020-10-18 MED ORDER — LANREOTIDE ACETATE 120 MG/0.5ML ~~LOC~~ SOLN
120.0000 mg | Freq: Once | SUBCUTANEOUS | Status: AC
  Administered 2020-10-18: 120 mg via SUBCUTANEOUS
  Filled 2020-10-18: qty 120

## 2020-10-18 NOTE — Patient Instructions (Signed)
Lanreotide injection What is this medicine? LANREOTIDE (lan REE oh tide) is used to reduce blood levels of growth hormone in patients with a condition called acromegaly. It also works to slow or stop tumor growth in patients with neuroendocrine tumors and treat carcinoid syndrome. This medicine may be used for other purposes; ask your health care provider or pharmacist if you have questions. COMMON BRAND NAME(S): Somatuline [place] What should I tell my health care provider before I take this medicine? They need to know if you have any of these conditions:  diabetes  gallbladder disease  heart disease  kidney disease  liver disease  thyroid disease  an unusual or allergic reaction to lanreotide, other medicines, foods, dyes, or preservatives  pregnant or trying to get pregnant  breast-feeding How should I use this medicine? This medicine is for injection under the skin. It is given by a health care professional in a hospital or clinic setting. Contact your pediatrician or health care professional regarding the use of this medicine in children. Special care may be needed. Overdosage: If you think you have taken too much of this medicine contact a poison control center or emergency room at once. NOTE: This medicine is only for you. Do not share this medicine with others. What if I miss a dose? It is important not to miss your dose. Call your doctor or health care professional if you are unable to keep an appointment. What may interact with this medicine? This medicine may interact with the following medications:  bromocriptine  cyclosporine  certain medicines for blood pressure, heart disease, irregular heart beat  certain medicines for diabetes  quinidine  terfenadine This list may not describe all possible interactions. Give your health care provider a list of all the medicines, herbs, non-prescription drugs, or dietary supplements you use. Also tell them if you smoke,  drink alcohol, or use illegal drugs. Some items may interact with your medicine. What should I watch for while using this medicine? Tell your doctor or healthcare professional if your symptoms do not start to get better or if they get worse. Visit your doctor or health care professional for regular checks on your progress. Your condition will be monitored carefully while you are receiving this medicine. This medicine may increase blood sugar. Ask your healthcare provider if changes in diet or medicines are needed if you have diabetes. You may need blood work done while you are taking this medicine. Women should inform their doctor if they wish to become pregnant or think they might be pregnant. There is a potential for serious side effects to an unborn child. Talk to your health care professional or pharmacist for more information. Do not breast-feed an infant while taking this medicine or for 6 months after stopping it. This medicine has caused ovarian failure in some women. This medicine may interfere with the ability to have a child. Talk with your doctor or health care professional if you are concerned about your fertility. What side effects may I notice from receiving this medicine? Side effects that you should report to your doctor or health care professional as soon as possible:  allergic reactions like skin rash, itching or hives, swelling of the face, lips, or tongue  increased blood pressure  severe stomach pain  signs and symptoms of hgh blood sugar such as being more thirsty or hungry or having to urinate more than normal. You may also feel very tired or have blurry vision.  signs and symptoms of low blood   sugar such as feeling anxious; confusion; dizziness; increased hunger; unusually weak or tired; sweating; shakiness; cold; irritable; headache; blurred vision; fast heartbeat; loss of consciousness  unusually slow heartbeat Side effects that usually do not require medical  attention (report to your doctor or health care professional if they continue or are bothersome):  constipation  diarrhea  dizziness  headache  muscle pain  muscle spasms  nausea  pain, redness, or irritation at site where injected This list may not describe all possible side effects. Call your doctor for medical advice about side effects. You may report side effects to FDA at 1-800-FDA-1088. Where should I keep my medicine? This drug is given in a hospital or clinic and will not be stored at home. NOTE: This sheet is a summary. It may not cover all possible information. If you have questions about this medicine, talk to your doctor, pharmacist, or health care provider.  2021 Elsevier/Gold Standard (2018-05-27 09:13:08)  

## 2020-10-30 DIAGNOSIS — K5900 Constipation, unspecified: Secondary | ICD-10-CM | POA: Diagnosis not present

## 2020-10-30 DIAGNOSIS — L821 Other seborrheic keratosis: Secondary | ICD-10-CM | POA: Diagnosis not present

## 2020-10-30 DIAGNOSIS — Z0000 Encounter for general adult medical examination without abnormal findings: Secondary | ICD-10-CM | POA: Diagnosis not present

## 2020-11-12 NOTE — Progress Notes (Signed)
[place]   [phone] Fax:([phone]) [phone]   Clinic Follow up Note   Patient Care Team: Alroy Dust, L.Marlou Sa, MD as PCP - General (Family Medicine) Burnell Blanks, MD as PCP - Cardiology (Cardiology) Jonnie Finner, RN as Oncology Nurse Navigator Truitt Merle, MD as Consulting Physician (Oncology) Alla Feeling, NP as Nurse Practitioner (Nurse Practitioner) Alexis Frock, MD as Consulting Physician (Urology) Tyler Pita, MD as Consulting Physician (Radiation Oncology)  Date of Service:  11/15/2020  CHIEF COMPLAINT: Follow-up metastatic neuroendocrine tumor  SUMMARY OF ONCOLOGIC HISTORY: Oncology History Overview Note  Cancer Staging No matching staging information was found for the patient.    Malignant neoplasm of prostate ([place])  08/31/2017 Initial Diagnosis   Malignant neoplasm of prostate ([place])   10/28/2017 Genetic Testing   The patient had genetic testing due to a personal history of prostate cancer and a family history of breast and stomach cancer.  The Common Hereditary Cancers Panel + Prostate Cancer Panel was ordered.  APC, ATM, AXIN2, BARD1, BMPR1A, BRCA1, BRCA2, BRIP1, CDH1, CDK4, CDKN2A (p14ARF), CDKN2A (p16INK4a), CHEK2, CTNNA1, DICER1, EPCAM*, FANCA, GREM1*, KIT, MEN1, MLH1, MSH2, MSH3, MSH6, MUTYH, NBN, NF1, PALB2, PDGFRA, PMS2, POLD1, POLE, PTEN, RAD50, RAD51C, RAD51D, SDHB, SDHC, SDHD, SMAD4, SMARCA4, STK11, TP53, TSC1, TSC2, VHL. The following genes were evaluated for sequence changes only: HOXB13*, NTHL1*, SDHA.   Results: Negative, no pathogenic variants identified. The date of this test report is 10/28/2017.    01/05/2018 - 02/12/2018 Radiation Therapy   S/p radiation to the prostate 70 Gy in 28 fractions from 01/05/2018-02/12/2018 per Dr. 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 2.5 x 2.0 x 3.0 cm soft tissue lesion in the central small bowel mesentery with  associated dystrophic calcification and retraction of adjacent small bowel loops features highly suspicious for metastatic carcinoid tumor.  Upper normal 9 mm short axis lymph node in the adjacent mesentery 2. 2.  1.6 x 1.4 cm heterogeneous, poorly defined lesion in the inferior right liver suspicious for metastatic disease, the differential includes GI primary or metastatic prostate cancer 3. No retroperitoneal or pelvic sidewall lymphadenopathy   04/01/2020 PET scan   IMPRESSION: 1. Central mesenteric mass with intense radiotracer activity consistent well differentiated neuroendocrine tumor 2. 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 3. Small peritoneal/nodal metastasis along the LEFT iliac vessels. 4. Very small lesion in the pancreas with intermediate activity could represent primary lesion. No primary bowel lesion identified. 5. Small metastatic implant within the pericardium adjacent LEFT heart ventricle. 6. No skeletal metastasis.   04/30/2020 Pathology Results   FINAL MICROSCOPIC DIAGNOSIS:  A. 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  Tumor cells are negative  for TTF-1.  The findings are consistent with neuroendocrine tumor of  gastrointestinal origin.  Ki-67 stain shows a proliferative index of  about 1%, consistent with well-differentiated, grade 1 tumor.    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 Held after cycle 1 due to suspected pericarditis and deconditioning.           --Changed to monthly Lanreotide on 10/18/20.        CURRENT THERAPY:  First-line octreotide (Sandostatin) monthlystarting 05/10/20, held after cycle 1.  Changed to  monthly Lanreotide on 10/18/20.   INTERVAL HISTORY:  Ryan Wilkerson is here for a follow up. He presents to the clinic alone. He tolerated the first injection well a month ago, with mild diarrhea for a week, then he developed constipation He has intermittent up gastric cramps a few times a week, it usually lasts 15-49mns, resolves on it's own, this has been going on since 2014  Appetite is normal, weight stable   All other systems were reviewed with the patient and are negative.  MEDICAL HISTORY:  Past Medical History:  Diagnosis Date  . Anemia   . [place]   . Erectile dysfunction   . Essential tremor   . Family history of breast cancer   . Family history of prostate cancer   . Family history of stomach cancer   . GERD (gastroesophageal reflux disease)   . History of colon polyps   . Hypercholesterolemia   . Hypertension   . Metast[place] malignant [facility]ocrine tumor to [place] ([facility]   . Nocturia   . Pericardial effusion   . Pericarditis   . Prostate cancer ([facility] urologist-- dr mTresa Moore/  oncologist-- dr manning   prostate bx 06-24-2016 and 09-15-2017 at dr mTresa Mooreoffice  Stage T1c, Gleason 4+3, PSA 8.48, vol 33cc---  planned external beam radiation therpay  . Tobacco abuse   . Urgency of urination   . Wears glasses     SURGICAL HISTORY: Past Surgical History:  Procedure Laterality Date  . COLONOSCOPY  last one 06/ 2018  . GOLD SEED IMPLANT N/A 12/23/2017   Procedure: GOLD SEED IMPLANT;  Surgeon: MAlexis Frock MD;  [facility]  Service: Urology;  Laterality: N/A;  . LEFT HEART CATH AND CORONARY ANGIOGRAPHY N/A 05/12/2020   Procedure: LEFT HEART CATH AND CORONARY ANGIOGRAPHY;  Surgeon: MBurnell Blanks MD;  [facility];  Service: Cardiovascular;  Laterality: N/A;  . PROSTATE BIOPSY  06-24-2016;  09-15-2017-- at dr mTresa Mooreoffice  . SPACE OAR INSTILLATION N/A 12/23/2017   Procedure: SPACE OAR  INSTILLATION;  Surgeon: MAlexis Frock MD;  [facility]  Service: Urology;  Laterality: N/A;    I have reviewed the social history and family history with the patient and they are unchanged from previous note.  ALLERGIES:  is allergic to aspirin and simvastatin.  MEDICATIONS:  Current Outpatient Medications  Medication Sig Dispense Refill  . acetaminophen (TYLENOL) 500 MG tablet Take 1,000 mg by mouth every 6 (six) hours as needed for headache (pain).     . budesonide-formoterol (SYMBICORT) 160-4.5 MCG/ACT inhaler 2 puffs    . clopidogrel (PLAVIX) 75 MG tablet Take 75 mg by mouth daily.     . fluticasone (FLONASE) 50 MCG/ACT nasal spray Place 2 sprays into both nostrils daily. 16 g 0  . lisinopril-hydrochlorothiazide (PRINZIDE,ZESTORETIC) 20-12.5 MG tablet Take 0.5 tablets by mouth every morning.     . loratadine (CLARITIN) 10 MG tablet Take 10 mg by mouth every morning.     .Marland KitchenOVER THE COUNTER MEDICATION Place 1 drop into both eyes daily as needed (dry eyes). Over the counter lubricating eye drop     . pantoprazole (PROTONIX) 40 MG tablet Take 1 tablet (40 mg total) by mouth daily. 30 tablet 0  . polyethylene glycol powder (GLYCOLAX/MIRALAX) 17 GM/SCOOP powder 1 dose    . pravastatin (PRAVACHOL) 40 MG tablet Take 40 mg by mouth every morning.     .Dellis Anes  160-4.5 MCG/ACT inhaler Inhale 1-2 puffs into the lungs as needed.    . traMADol (ULTRAM) 50 MG tablet Take 50-100 mg by mouth every 6 (six) hours as needed for moderate pain or severe pain.      No current facility-administered medications for this visit.    PHYSICAL EXAMINATION: ECOG PERFORMANCE STATUS: 1 - Symptomatic but completely ambulatory  Vitals:   11/15/20 1038  BP: 114/74  Pulse: (!) 57  Resp: 19  Temp: (!) 96.8 F (36 C)  SpO2: 96%   Filed Weights   11/15/20 1038  Weight: 154 lb 14.4 oz (70.3 kg)    GENERAL:alert, no distress and comfortable SKIN: skin color, texture, turgor are  normal, no rashes or significant lesions EYES: normal, Conjunctiva are pink and non-injected, sclera clear NECK: supple, thyroid normal size, non-tender, without nodularity LYMPH:  no palpable lymphadenopathy in the cervical, axillary  LUNGS: clear to auscultation and percussion with normal breathing effort HEART: regular rate & rhythm and no murmurs and no lower extremity edema ABDOMEN:abdomen soft, non-tender and normal bowel sounds Musculoskeletal:no cyanosis of digits and no clubbing  NEURO: alert & oriented x 3 with fluent speech, no focal motor/sensory deficits  LABORATORY DATA:  I have reviewed the data as listed CBC Latest Ref Rng & Units 11/15/2020 10/04/2020 07/31/2020  WBC 4.0 - 10.5 K/uL 5.2 4.9 8.9  Hemoglobin 13.0 - 17.0 g/dL 12.6(L) 12.7(L) 12.0(L)  Hematocrit 39.0 - 52.0 % 39.6 39.0 37.7(L)  Platelets 150 - 400 K/uL 198 188 241     CMP Latest Ref Rng & Units 11/15/2020 10/04/2020 07/31/2020  Glucose 70 - 99 mg/dL 119(H) 88 125(H)  BUN 8 - 23 mg/dL _0 Creatinine 0.61 - 1.24 mg/dL 1.36(H) 1.24 1.10  Sodium 135 - 145 mmol/L 141 141 135  Potassium 3.5 - 5.1 mmol/L 4.2 4.1 3.9  Chloride 98 - 111 mmol/L 103 105 98  CO2 22 - 32 mmol/L _1 Calcium 8.9 - 10.3 mg/dL 9.3 9.2 9.3  Total Protein 6.5 - 8.1 g/dL 7.1 7.1 7.7  Total Bilirubin 0.3 - 1.2 mg/dL 0.7 0.6 0.6  Alkaline Phos 38 - 126 U/L 52 57 52  AST 15 - 41 U/L 18 27 34  ALT 0 - 44 U/L 18 30 42      RADIOGRAPHIC STUDIES: I have personally reviewed the radiological images as listed and agreed with the findings in the report. No results found.   ASSESSMENT & PLAN:  Ryan Wilkerson is a 75 y.o. male with    1.Well differentiated/G1 neuroendocrine tumor, consistent with GI primary with liver and nodal metastasis -Due to rising PSA, CT/bone scan were obtained showing a mesenteric mass and single liver lesion. We do not have previous CT to compare.PET dotatate on 04/09/2020 showed intense uptake in the  central mesenteric mass with 3 well differentiated liver metastases and small peritoneal/nodal metastatic deposits along the left iliac vessels. There is no primary bowel lesion identified. There is a very small lesion in the pancreas with indeterminate activity.The primary site was still undetermined -His 04/30/20 liver biopsy showedmetastatic well differentiated/G1 neuroendocrine tumor to the liver. IHC stains positive for synaptophysin, chromogranin, CD56 and CDX2 and negative for TTF-1-1. Overall consistent with a GI primary. Ki-67 of 1%.  -Outpatient MRI of the abdomen ruled out pancreas as the primary siteon 05/16/20  -His case was discussed in tumor board,unfortunately this is metastatic diseaseand surgical resection is not an option. -He began first line Sandostatin 20 mg  on 05/10/20. However on 05/12/20 he had pericarditis. Treatment has been held since. I changed him to Lanreotide 132m every 4 weeks starting 10/18/20 -He tolerated first injection well, no noticeable side effects so far -Lab reviewed, adequate for treatment, will proceed to second dose lanreotide today and continue every 4-week -Follow-up in 3 months with lab and CT scan a few days before    2.History of pericarditis  -Occurred 2 days after first octreotide on 05/10/20.  -ED 05/12/2020 found to have inferior STE,echo showed preserved LV function and small pericardial effusion. CTAnegative foraortic dissection. Urgent cathshowed normal coronaries -He has recovered well now   3. 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 MTammi Klippel-PSA down to 1.8 on 08/2018, steady rise to 4.69 on 6/21 -CT/bone scan in 6/21 showed no obvious metastatic disease from prostate cancerbut did reveal mesenteric mass and liver lesionultimately biopsy-proven neuroendocrine tumor -Followed by Dr. MRetta Mac[facility]iance  urology,I will talk to Dr. MTresa Moore [place] to proceed prostate cancer treatment from my standpoint   4.Family history/genetics -He has personal and strong family history of cancers,including7 brotherswithprostate cancer, 1 of those has lung cancer also, a sister with lungcancerand another sister with breast cancer -His 10/28/17 genetic testing was negative for pathogenic mutation or VUS.  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   Orders Placed This Encounter  Procedures  . CT Abdomen Pelvis W Contrast    Standing Status:   Future    Standing Expiration Date:   11/15/2021    Order Specific Question:   If indicated for the ordered procedure, I authorize the administration of contrast media per Radiology protocol    Answer:   Yes    Order Specific Question:   [facility]    [facility]   Order Specific Question:   Release to patient    Answer:   Immediate    Order Specific Question:   Is Oral Contrast requested for this exam?    Answer:   Yes, Per Radiology protocol   All questions were answered. The patient knows to call the clinic with any problems, questions or concerns. No barriers to learning was detected. The total time spent in the appointment was 30 minutes.     YTruitt Merle MD 11/15/2020   I, AJoslyn Devon am acting as scribe for YTruitt Merle MD.   I have reviewed the above  documentation for accuracy and completeness, and I agree with the above.

## 2020-11-15 ENCOUNTER — Encounter: Payer: Self-pay | Admitting: Hematology

## 2020-11-15 ENCOUNTER — Inpatient Hospital Stay: Payer: Medicare HMO | Admitting: Hematology

## 2020-11-15 ENCOUNTER — Inpatient Hospital Stay: Payer: Medicare HMO | Attending: Nurse Practitioner

## 2020-11-15 ENCOUNTER — Telehealth: Payer: Self-pay | Admitting: Hematology

## 2020-11-15 ENCOUNTER — Other Ambulatory Visit: Payer: Self-pay

## 2020-11-15 ENCOUNTER — Inpatient Hospital Stay: Payer: Medicare HMO

## 2020-11-15 VITALS — BP 114/74 | HR 57 | Temp 96.8°F | Resp 19 | Ht 68.0 in | Wt 154.9 lb

## 2020-11-15 DIAGNOSIS — Z7951 Long term (current) use of inhaled steroids: Secondary | ICD-10-CM | POA: Diagnosis not present

## 2020-11-15 DIAGNOSIS — Z923 Personal history of irradiation: Secondary | ICD-10-CM | POA: Diagnosis not present

## 2020-11-15 DIAGNOSIS — C7B8 Other secondary neuroendocrine tumors: Secondary | ICD-10-CM

## 2020-11-15 DIAGNOSIS — Z8042 Family history of malignant neoplasm of prostate: Secondary | ICD-10-CM | POA: Diagnosis not present

## 2020-11-15 DIAGNOSIS — C7A00 Malignant carcinoid tumor of unspecified site: Secondary | ICD-10-CM | POA: Insufficient documentation

## 2020-11-15 DIAGNOSIS — I10 Essential (primary) hypertension: Secondary | ICD-10-CM | POA: Diagnosis not present

## 2020-11-15 DIAGNOSIS — Z8546 Personal history of malignant neoplasm of prostate: Secondary | ICD-10-CM | POA: Diagnosis not present

## 2020-11-15 DIAGNOSIS — Z803 Family history of malignant neoplasm of breast: Secondary | ICD-10-CM | POA: Diagnosis not present

## 2020-11-15 DIAGNOSIS — C7B02 Secondary carcinoid tumors of liver: Secondary | ICD-10-CM | POA: Diagnosis not present

## 2020-11-15 DIAGNOSIS — Z7189 Other specified counseling: Secondary | ICD-10-CM

## 2020-11-15 DIAGNOSIS — Z800 Family history of malignant neoplasm of digestive organs: Secondary | ICD-10-CM | POA: Diagnosis not present

## 2020-11-15 DIAGNOSIS — Z79899 Other long term (current) drug therapy: Secondary | ICD-10-CM | POA: Diagnosis not present

## 2020-11-15 DIAGNOSIS — K6389 Other specified diseases of intestine: Secondary | ICD-10-CM

## 2020-11-15 LAB — CMP (CANCER CENTER ONLY)
ALT: 18 U/L (ref 0–44)
AST: 18 U/L (ref 15–41)
Albumin: 3.9 g/dL (ref 3.5–5.0)
Alkaline Phosphatase: 52 U/L (ref 38–126)
Anion gap: 8 (ref 5–15)
BUN: 11 mg/dL (ref 8–23)
CO2: 30 mmol/L (ref 22–32)
Calcium: 9.3 mg/dL (ref 8.9–10.3)
Chloride: 103 mmol/L (ref 98–111)
Creatinine: 1.36 mg/dL — ABNORMAL HIGH (ref 0.61–1.24)
GFR, Estimated: 55 mL/min — ABNORMAL LOW (ref >60)
Glucose, Bld: 119 mg/dL — ABNORMAL HIGH (ref 70–99)
Potassium: 4.2 mmol/L (ref 3.5–5.1)
Sodium: 141 mmol/L (ref 135–145)
Total Bilirubin: 0.7 mg/dL (ref 0.3–1.2)
Total Protein: 7.1 g/dL (ref 6.5–8.1)

## 2020-11-15 LAB — CBC WITH DIFFERENTIAL (CANCER CENTER ONLY)
Abs Immature Granulocytes: 0.01 10*3/uL (ref 0.00–0.07)
Basophils Absolute: 0 10*3/uL (ref 0.0–0.1)
Basophils Relative: 1 %
Eosinophils Absolute: 0.2 10*3/uL (ref 0.0–0.5)
Eosinophils Relative: 3 %
HCT: 39.6 % (ref 39.0–52.0)
Hemoglobin: 12.6 g/dL — ABNORMAL LOW (ref 13.0–17.0)
Immature Granulocytes: 0 %
Lymphocytes Relative: 27 %
Lymphs Abs: 1.4 10*3/uL (ref 0.7–4.0)
MCH: 29.3 pg (ref 26.0–34.0)
MCHC: 31.8 g/dL (ref 30.0–36.0)
MCV: 92.1 fL (ref 80.0–100.0)
Monocytes Absolute: 0.4 10*3/uL (ref 0.1–1.0)
Monocytes Relative: 8 %
Neutro Abs: 3.2 10*3/uL (ref 1.7–7.7)
Neutrophils Relative %: 61 %
Platelet Count: 198 10*3/uL (ref 150–400)
RBC: 4.3 MIL/uL (ref 4.22–5.81)
RDW: 11.7 % (ref 11.5–15.5)
WBC Count: 5.2 10*3/uL (ref 4.0–10.5)
nRBC: 0 % (ref 0.0–0.2)

## 2020-11-15 MED ORDER — LANREOTIDE ACETATE 120 MG/0.5ML ~~LOC~~ SOLN
120.0000 mg | Freq: Once | SUBCUTANEOUS | Status: AC
  Administered 2020-11-15: 120 mg via SUBCUTANEOUS
  Filled 2020-11-15: qty 120

## 2020-11-15 NOTE — Telephone Encounter (Signed)
Scheduled per los. Gave avs and calendar  

## 2020-11-15 NOTE — Patient Instructions (Signed)
Lanreotide injection What is this medicine? LANREOTIDE (lan REE oh tide) is used to reduce blood levels of growth hormone in patients with a condition called acromegaly. It also works to slow or stop tumor growth in patients with neuroendocrine tumors and treat carcinoid syndrome. This medicine may be used for other purposes; ask your health care provider or pharmacist if you have questions. COMMON BRAND NAME(S): Somatuline [place] What should I tell my health care provider before I take this medicine? They need to know if you have any of these conditions:  diabetes  gallbladder disease  heart disease  kidney disease  liver disease  thyroid disease  an unusual or allergic reaction to lanreotide, other medicines, foods, dyes, or preservatives  pregnant or trying to get pregnant  breast-feeding How should I use this medicine? This medicine is for injection under the skin. It is given by a health care professional in a hospital or clinic setting. Contact your pediatrician or health care professional regarding the use of this medicine in children. Special care may be needed. Overdosage: If you think you have taken too much of this medicine contact a poison control center or emergency room at once. NOTE: This medicine is only for you. Do not share this medicine with others. What if I miss a dose? It is important not to miss your dose. Call your doctor or health care professional if you are unable to keep an appointment. What may interact with this medicine? This medicine may interact with the following medications:  bromocriptine  cyclosporine  certain medicines for blood pressure, heart disease, irregular heart beat  certain medicines for diabetes  quinidine  terfenadine This list may not describe all possible interactions. Give your health care provider a list of all the medicines, herbs, non-prescription drugs, or dietary supplements you use. Also tell them if you smoke,  drink alcohol, or use illegal drugs. Some items may interact with your medicine. What should I watch for while using this medicine? Tell your doctor or healthcare professional if your symptoms do not start to get better or if they get worse. Visit your doctor or health care professional for regular checks on your progress. Your condition will be monitored carefully while you are receiving this medicine. This medicine may increase blood sugar. Ask your healthcare provider if changes in diet or medicines are needed if you have diabetes. You may need blood work done while you are taking this medicine. Women should inform their doctor if they wish to become pregnant or think they might be pregnant. There is a potential for serious side effects to an unborn child. Talk to your health care professional or pharmacist for more information. Do not breast-feed an infant while taking this medicine or for 6 months after stopping it. This medicine has caused ovarian failure in some women. This medicine may interfere with the ability to have a child. Talk with your doctor or health care professional if you are concerned about your fertility. What side effects may I notice from receiving this medicine? Side effects that you should report to your doctor or health care professional as soon as possible:  allergic reactions like skin rash, itching or hives, swelling of the face, lips, or tongue  increased blood pressure  severe stomach pain  signs and symptoms of hgh blood sugar such as being more thirsty or hungry or having to urinate more than normal. You may also feel very tired or have blurry vision.  signs and symptoms of low blood   sugar such as feeling anxious; confusion; dizziness; increased hunger; unusually weak or tired; sweating; shakiness; cold; irritable; headache; blurred vision; fast heartbeat; loss of consciousness  unusually slow heartbeat Side effects that usually do not require medical  attention (report to your doctor or health care professional if they continue or are bothersome):  constipation  diarrhea  dizziness  headache  muscle pain  muscle spasms  nausea  pain, redness, or irritation at site where injected This list may not describe all possible side effects. Call your doctor for medical advice about side effects. You may report side effects to FDA at 1-800-FDA-1088. Where should I keep my medicine? This drug is given in a hospital or clinic and will not be stored at home. NOTE: This sheet is a summary. It may not cover all possible information. If you have questions about this medicine, talk to your doctor, pharmacist, or health care provider.  2021 Elsevier/Gold Standard (2018-05-27 09:13:08)  

## 2020-11-17 LAB — CHROMOGRANIN A: Chromogranin A (ng/mL): 91.6 ng/mL (ref 0.0–101.8)

## 2020-11-21 DIAGNOSIS — R311 Benign essential microscopic hematuria: Secondary | ICD-10-CM | POA: Diagnosis not present

## 2020-11-21 DIAGNOSIS — C61 Malignant neoplasm of prostate: Secondary | ICD-10-CM | POA: Diagnosis not present

## 2020-11-21 DIAGNOSIS — R1033 Periumbilical pain: Secondary | ICD-10-CM | POA: Diagnosis not present

## 2020-11-22 ENCOUNTER — Observation Stay (HOSPITAL_COMMUNITY)
Admission: EM | Admit: 2020-11-22 | Discharge: 2020-11-26 | Disposition: A | Discharge status: Home / Self Care | Payer: Medicare HMO | Attending: Internal Medicine | Admitting: Internal Medicine

## 2020-11-22 ENCOUNTER — Emergency Department (HOSPITAL_COMMUNITY): Payer: Medicare HMO

## 2020-11-22 ENCOUNTER — Other Ambulatory Visit: Payer: Self-pay

## 2020-11-22 ENCOUNTER — Encounter (HOSPITAL_COMMUNITY): Payer: Self-pay

## 2020-11-22 ENCOUNTER — Telehealth: Payer: Self-pay

## 2020-11-22 DIAGNOSIS — R1030 Lower abdominal pain, unspecified: Principal | ICD-10-CM | POA: Insufficient documentation

## 2020-11-22 DIAGNOSIS — I10 Essential (primary) hypertension: Secondary | ICD-10-CM | POA: Insufficient documentation

## 2020-11-22 DIAGNOSIS — Z20822 Contact with and (suspected) exposure to covid-19: Secondary | ICD-10-CM | POA: Insufficient documentation

## 2020-11-22 DIAGNOSIS — R1011 Right upper quadrant pain: Secondary | ICD-10-CM

## 2020-11-22 DIAGNOSIS — C61 Malignant neoplasm of prostate: Secondary | ICD-10-CM | POA: Diagnosis present

## 2020-11-22 DIAGNOSIS — K5900 Constipation, unspecified: Secondary | ICD-10-CM | POA: Insufficient documentation

## 2020-11-22 DIAGNOSIS — Z8546 Personal history of malignant neoplasm of prostate: Secondary | ICD-10-CM | POA: Diagnosis not present

## 2020-11-22 DIAGNOSIS — Z8505 Personal history of malignant neoplasm of liver: Secondary | ICD-10-CM | POA: Insufficient documentation

## 2020-11-22 DIAGNOSIS — Z87891 Personal history of nicotine dependence: Secondary | ICD-10-CM | POA: Diagnosis not present

## 2020-11-22 DIAGNOSIS — R1010 Upper abdominal pain, unspecified: Secondary | ICD-10-CM | POA: Diagnosis present

## 2020-11-22 DIAGNOSIS — R109 Unspecified abdominal pain: Secondary | ICD-10-CM | POA: Diagnosis not present

## 2020-11-22 DIAGNOSIS — K828 Other specified diseases of gallbladder: Secondary | ICD-10-CM | POA: Diagnosis not present

## 2020-11-22 DIAGNOSIS — K219 Gastro-esophageal reflux disease without esophagitis: Secondary | ICD-10-CM | POA: Diagnosis present

## 2020-11-22 DIAGNOSIS — E782 Mixed hyperlipidemia: Secondary | ICD-10-CM | POA: Diagnosis present

## 2020-11-22 DIAGNOSIS — C7B8 Other secondary neuroendocrine tumors: Secondary | ICD-10-CM | POA: Diagnosis present

## 2020-11-22 DIAGNOSIS — Z79899 Other long term (current) drug therapy: Secondary | ICD-10-CM | POA: Diagnosis not present

## 2020-11-22 HISTORY — DX: Acute nonspecific idiopathic pericarditis: I30.0

## 2020-11-22 LAB — COMPREHENSIVE METABOLIC PANEL
ALT: 18 U/L (ref 0–44)
AST: 26 U/L (ref 15–41)
Albumin: 3.8 g/dL (ref 3.5–5.0)
Alkaline Phosphatase: 45 U/L (ref 38–126)
Anion gap: 9 (ref 5–15)
BUN: 9 mg/dL (ref 8–23)
CO2: 28 mmol/L (ref 22–32)
Calcium: 9.3 mg/dL (ref 8.9–10.3)
Chloride: 101 mmol/L (ref 98–111)
Creatinine, Ser: 1.27 mg/dL — ABNORMAL HIGH (ref 0.61–1.24)
GFR, Estimated: 59 mL/min — ABNORMAL LOW (ref >60)
Glucose, Bld: 133 mg/dL — ABNORMAL HIGH (ref 70–99)
Potassium: 3.8 mmol/L (ref 3.5–5.1)
Sodium: 138 mmol/L (ref 135–145)
Total Bilirubin: 0.6 mg/dL (ref 0.3–1.2)
Total Protein: 6.8 g/dL (ref 6.5–8.1)

## 2020-11-22 LAB — URINALYSIS, ROUTINE W REFLEX MICROSCOPIC
Bilirubin Urine: NEGATIVE
Glucose, UA: NEGATIVE mg/dL
Ketones, ur: 20 mg/dL — AB
Leukocytes,Ua: NEGATIVE
Nitrite: NEGATIVE
Protein, ur: NEGATIVE mg/dL
Specific Gravity, Urine: 1.023 (ref 1.005–1.030)
pH: 6 (ref 5.0–8.0)

## 2020-11-22 LAB — CBC
HCT: 36.5 % — ABNORMAL LOW (ref 39.0–52.0)
Hemoglobin: 12.2 g/dL — ABNORMAL LOW (ref 13.0–17.0)
MCH: 30.3 pg (ref 26.0–34.0)
MCHC: 33.4 g/dL (ref 30.0–36.0)
MCV: 90.8 fL (ref 80.0–100.0)
Platelets: 223 10*3/uL (ref 150–400)
RBC: 4.02 MIL/uL — ABNORMAL LOW (ref 4.22–5.81)
RDW: 11.5 % (ref 11.5–15.5)
WBC: 14 10*3/uL — ABNORMAL HIGH (ref 4.0–10.5)
nRBC: 0 % (ref 0.0–0.2)

## 2020-11-22 LAB — LIPASE, BLOOD: Lipase: 21 U/L (ref 11–51)

## 2020-11-22 LAB — TROPONIN I (HIGH SENSITIVITY): Troponin I (High Sensitivity): 29 ng/L — ABNORMAL HIGH (ref <18)

## 2020-11-22 IMAGING — CT CT ABD-PELV W/ CM
2 of 5 series · 17 of 46 positions shown, 19 images · IV contrast (APPLIED)
Comparison: [DATE]

CLINICAL DATA: Abdominal pain

EXAM:
CT ABDOMEN AND PELVIS WITH CONTRAST
TECHNIQUE: Multidetector CT imaging of the abdomen and pelvis was performed
using the standard protocol following bolus administration of
intravenous contrast.
CONTRAST:  80mL OMNIPAQUE IOHEXOL 300 MG/ML  SOLN

[Series 3: abd/ pelvis 5.0 i30f 2 · axial · 0.74mm/px · z∈[+876,+1256]mm · 14 of 86 slices shown, 16 images]
[im 5/86  soft-tissue]
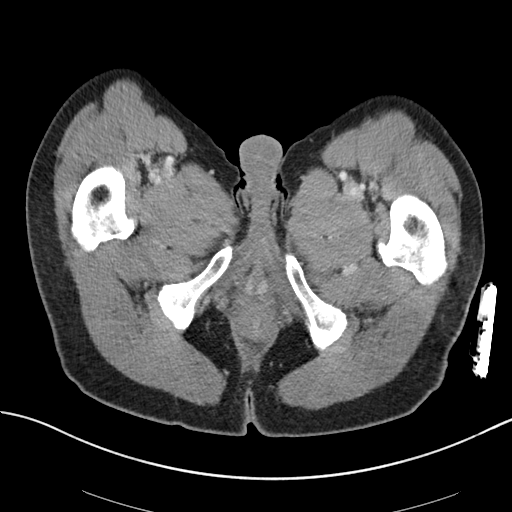
[im 5/86  bone]
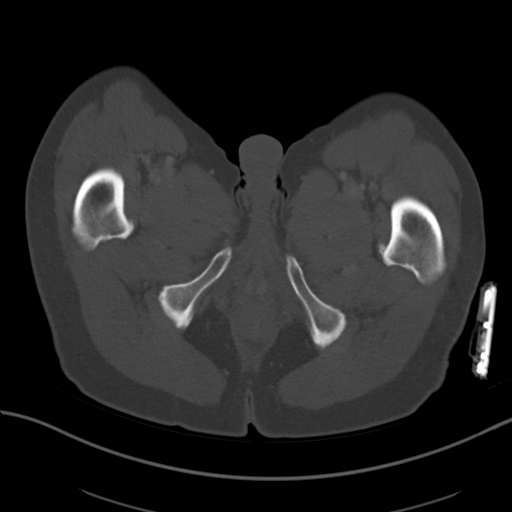
[im 9/86  soft-tissue]
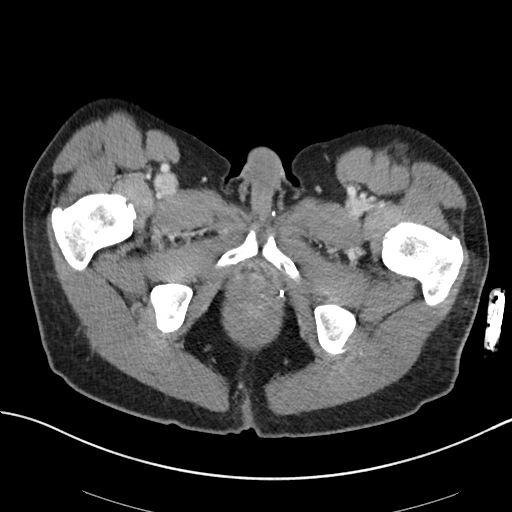
[im 18/86  soft-tissue]
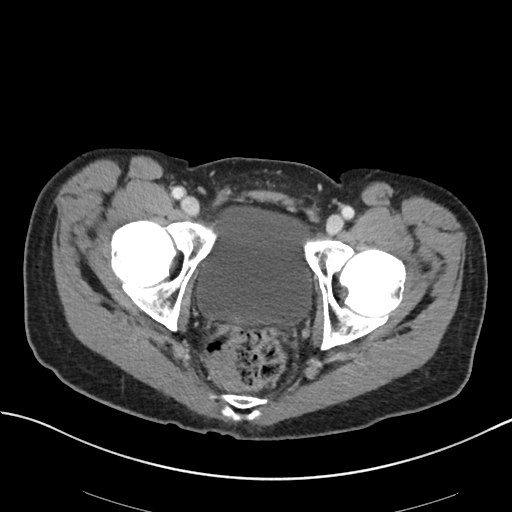
[im 23/86  soft-tissue]
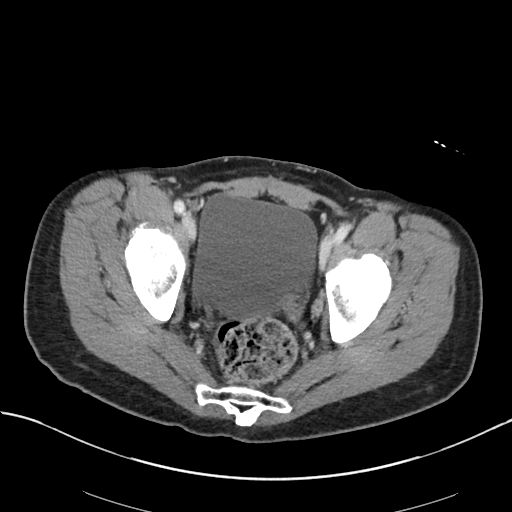
[im 27/86  soft-tissue]
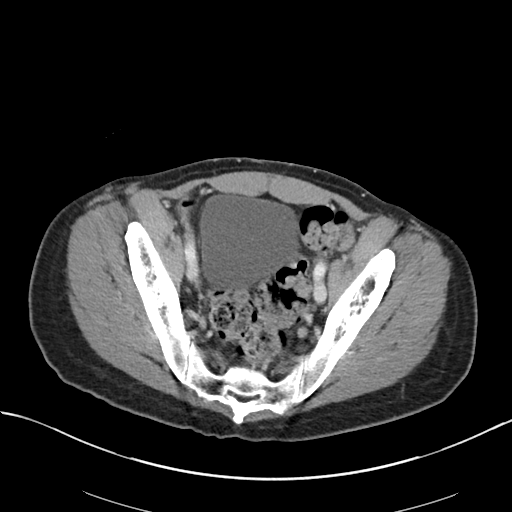
[im 36/86  soft-tissue]
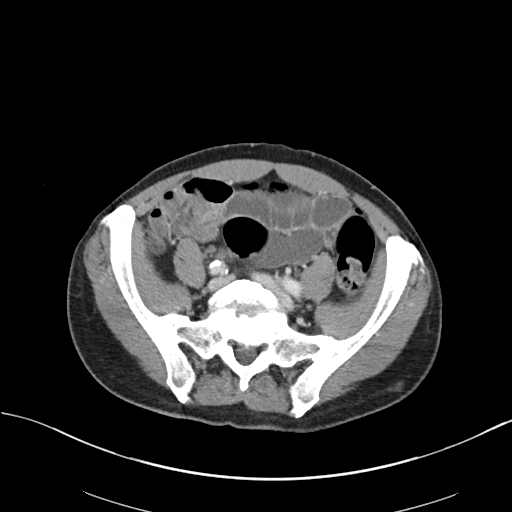
[im 41/86  soft-tissue]
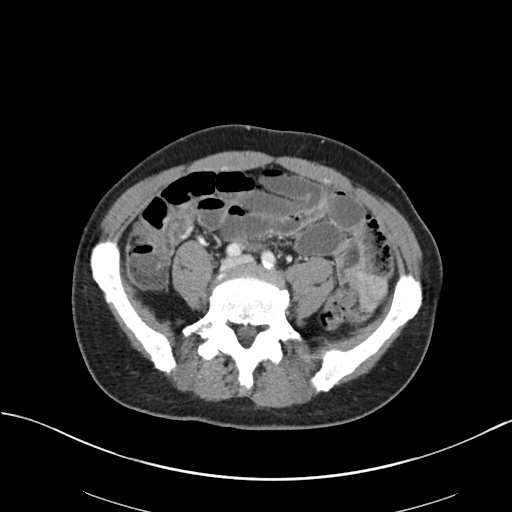
[im 45/86  soft-tissue]
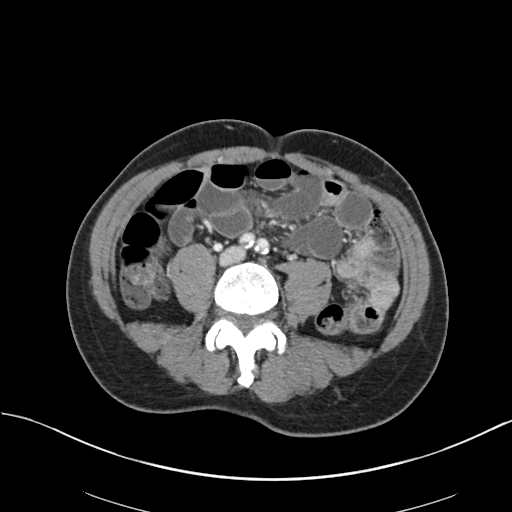
[im 50/86  soft-tissue]
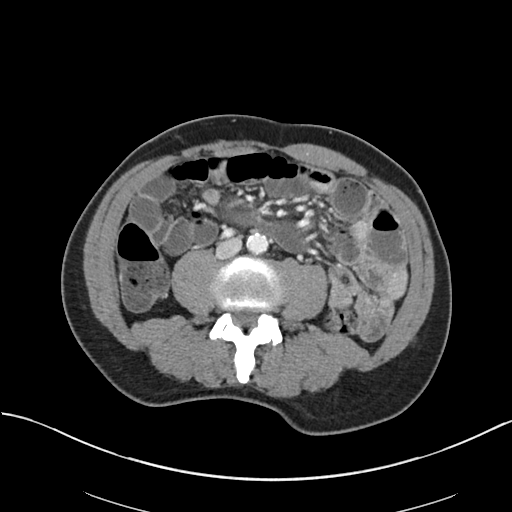
[im 50/86  bone]
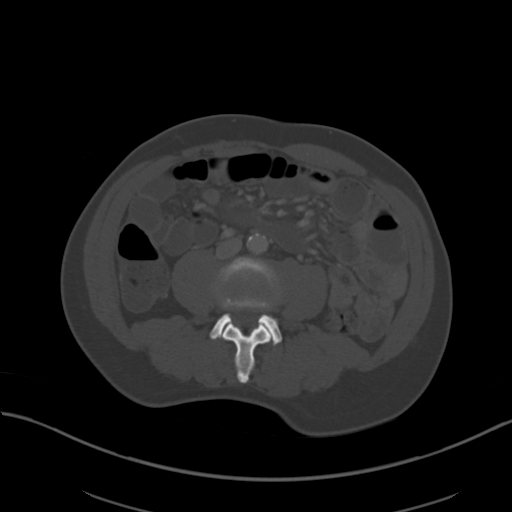
[im 59/86  soft-tissue]
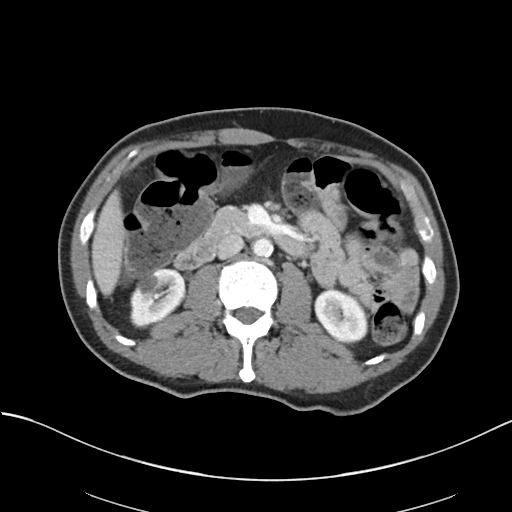
[im 63/86  soft-tissue]
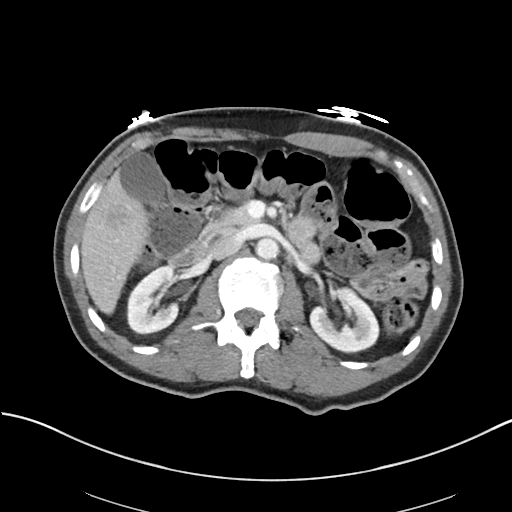
[im 68/86  soft-tissue]
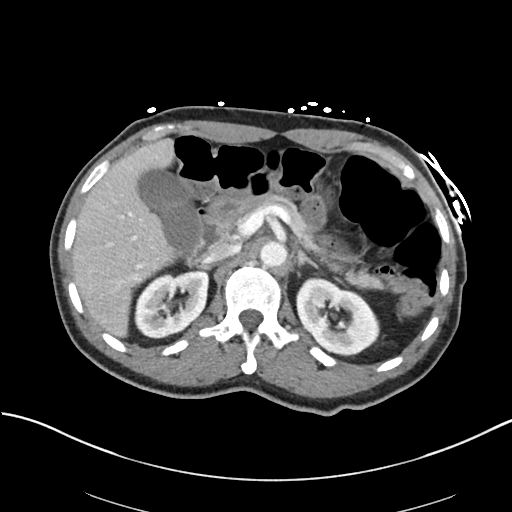
[im 77/86  soft-tissue]
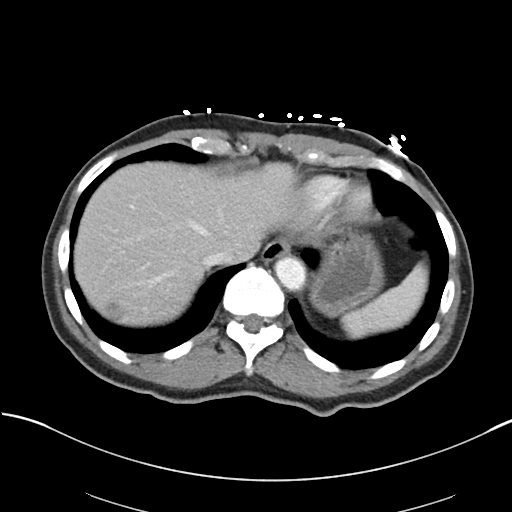
[im 81/86  soft-tissue]
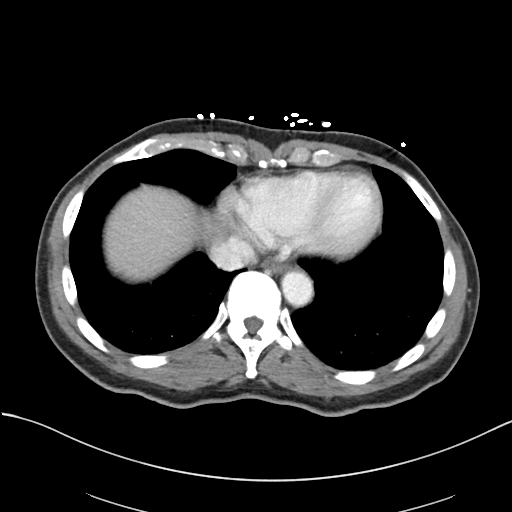

[Series 6: coronal soft tissue · coronal · 0.77mm/px · 3 of 85 slices shown]
[im 29/85  soft-tissue]
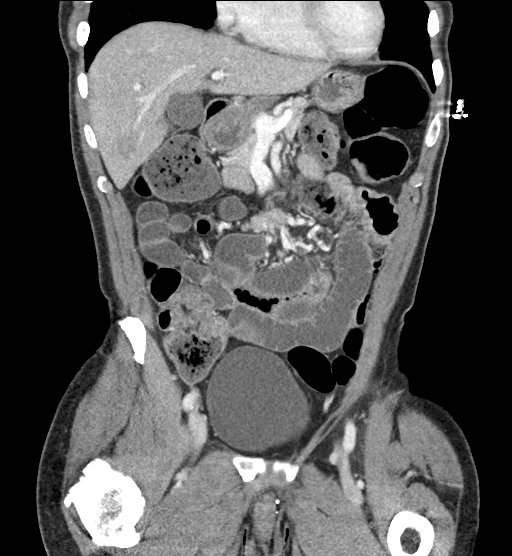
[im 38/85  soft-tissue]
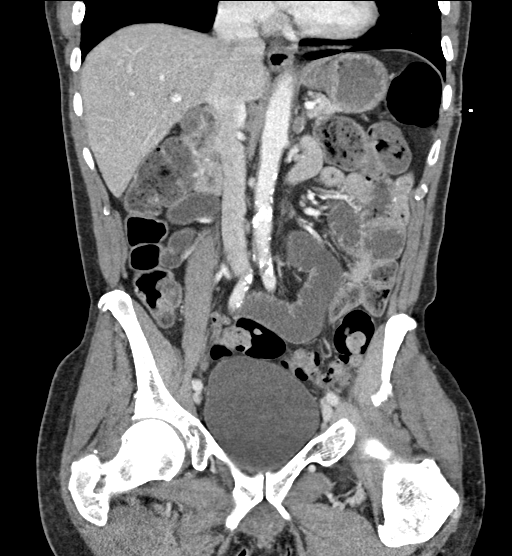
[im 47/85  soft-tissue]
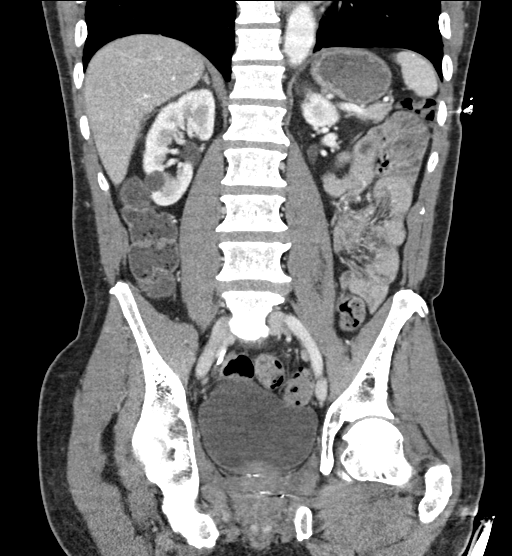

[17 of 46 positions shown; findings below may reference images not displayed]

FINDINGS: Lower chest: Lung bases are clear. No effusions. Heart is normal
size.

Hepatobiliary: Low-density area posteriorly in the liver measures 15
mm. 18 mm lesion in the inferior right hepatic lobe. These areas are
stable when compared to prior MRI. Gallbladder unremarkable.

Pancreas: No focal abnormality or ductal dilatation.

Spleen: No focal abnormality.  Normal size.

Adrenals/Urinary Tract: No adrenal mass. No hydronephrosis or
suspicious renal mass. Urinary bladder unremarkable.

Stomach/Bowel: Moderate stool in the colon. Stomach, large and small
bowel grossly unremarkable.

Vascular/Lymphatic: Aortic atherosclerosis. No evidence of aneurysm
or adenopathy.

Reproductive: Prostate enlargement.

Other: No evidence of aneurysm or adenopathy.

Musculoskeletal: No acute bony abnormality.
IMPRESSION: 15 mm and 18 mm lesions within the right hepatic lobe, stable since
prior MRI.

Moderate stool throughout the colon.

Aortic atherosclerosis.

Prostate enlargement.

No acute findings in the abdomen or pelvis.

## 2020-11-22 IMAGING — US US ABDOMEN LIMITED RUQ/ASCITES
1 series · 14 of 25 positions shown · non-contrast
Comparison: None.

CLINICAL DATA: Right upper quadrant pain

EXAM:
ULTRASOUND ABDOMEN LIMITED RIGHT UPPER QUADRANT

[Series 1: us abdomen limited ruq (liver/gb) · 14 of 48 slices shown]
[im 1/48]
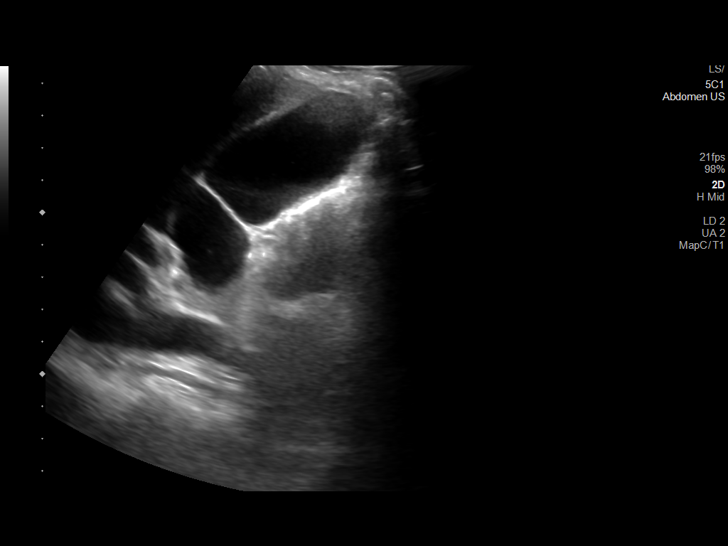
[im 4/48]
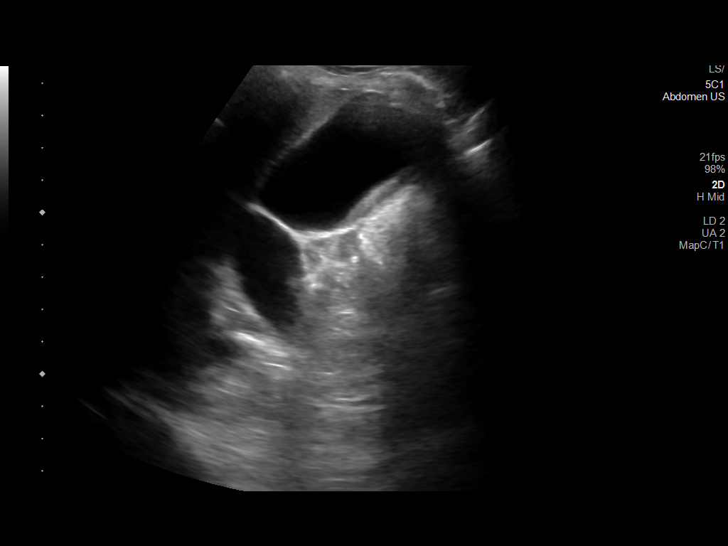
[im 8/48]
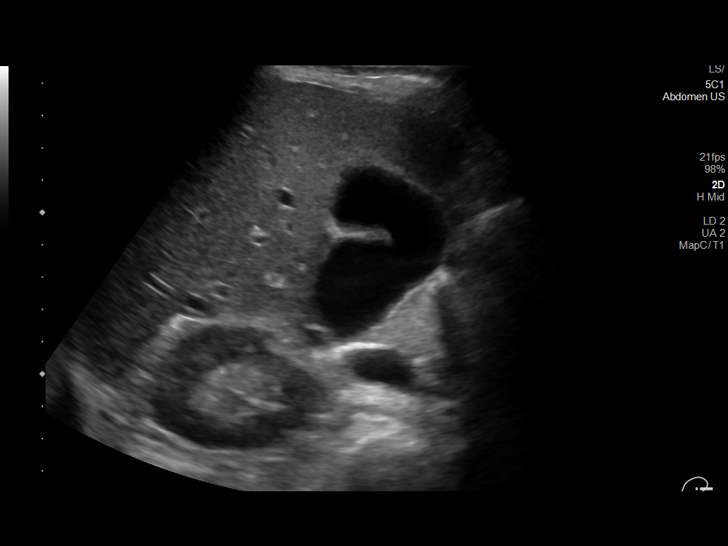
[im 12/48]
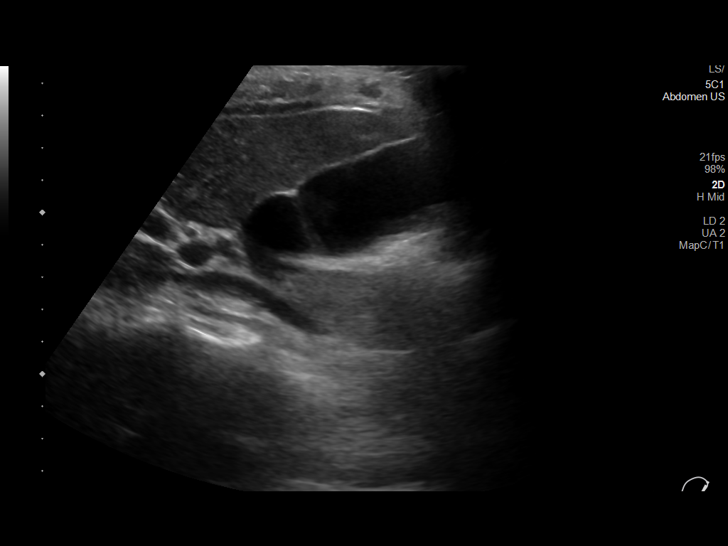
[im 16/48]
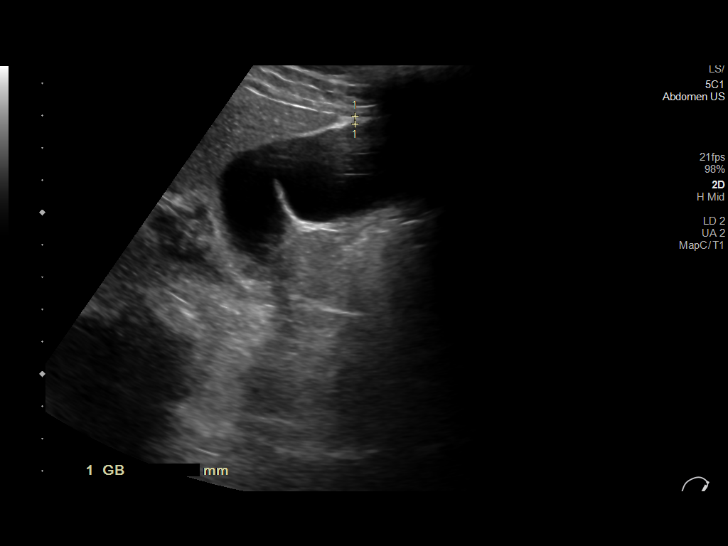
[im 18/48]
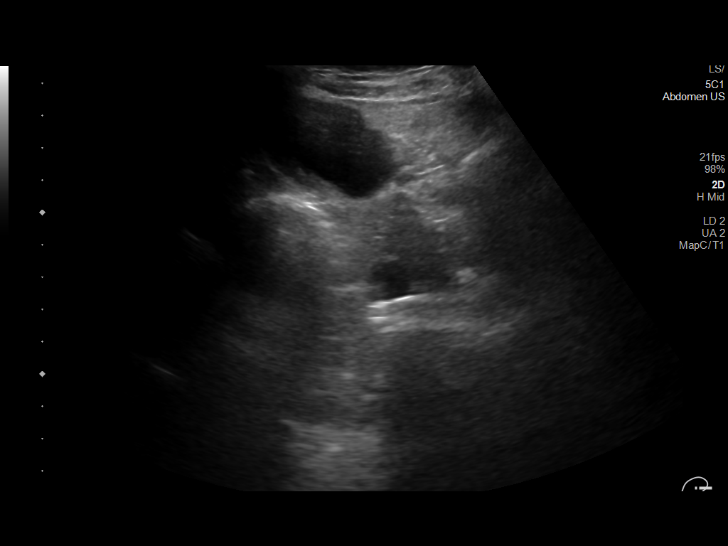
[im 22/48]
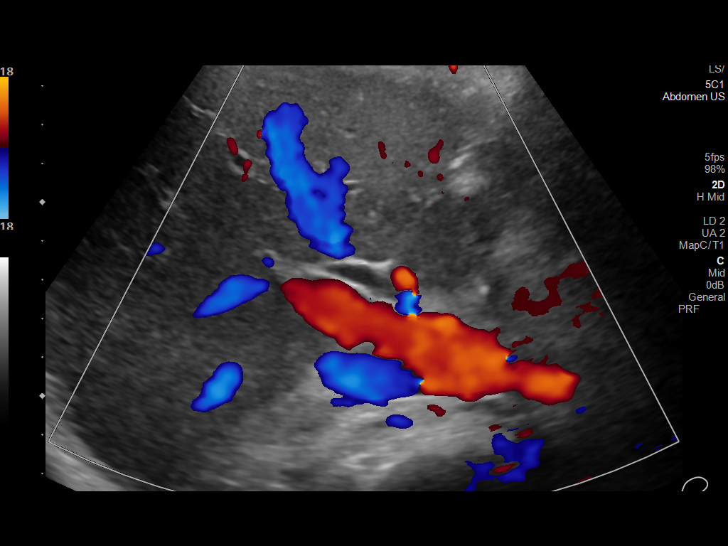
[im 26/48]
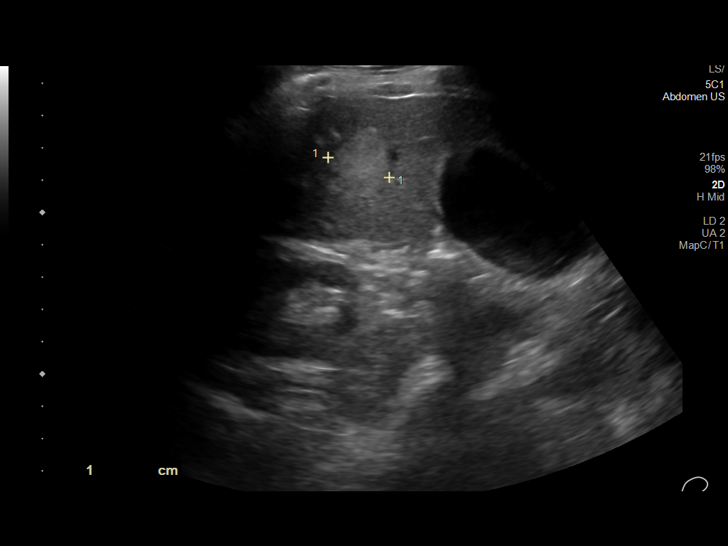
[im 30/48]
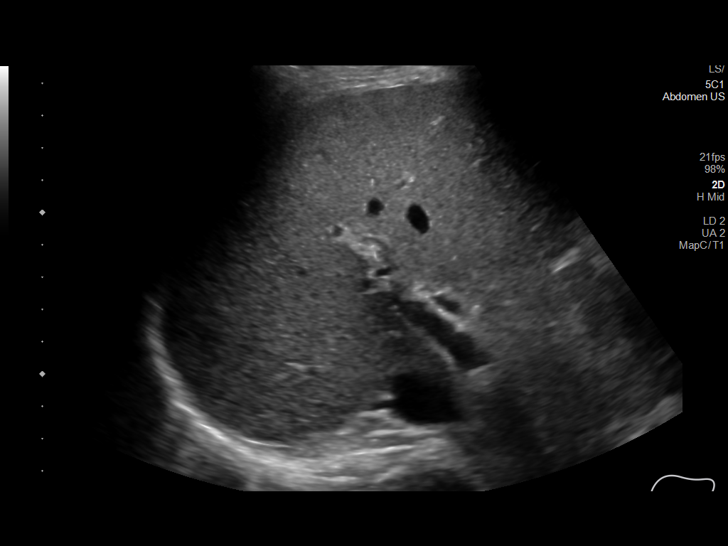
[im 32/48]
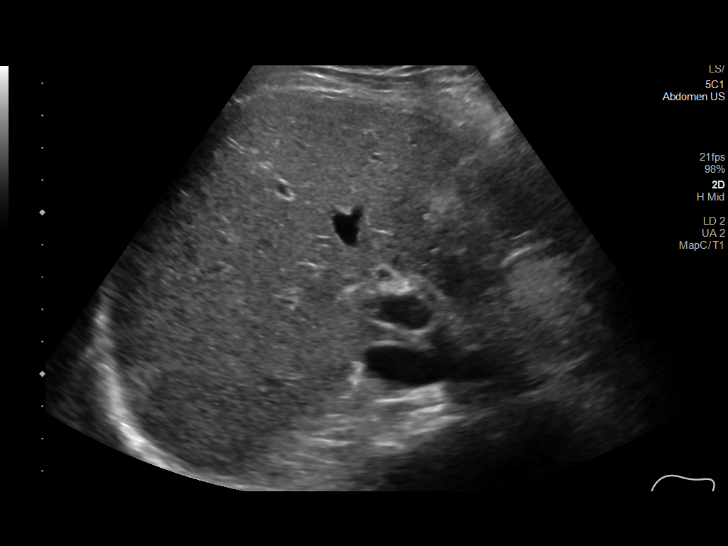
[im 36/48]
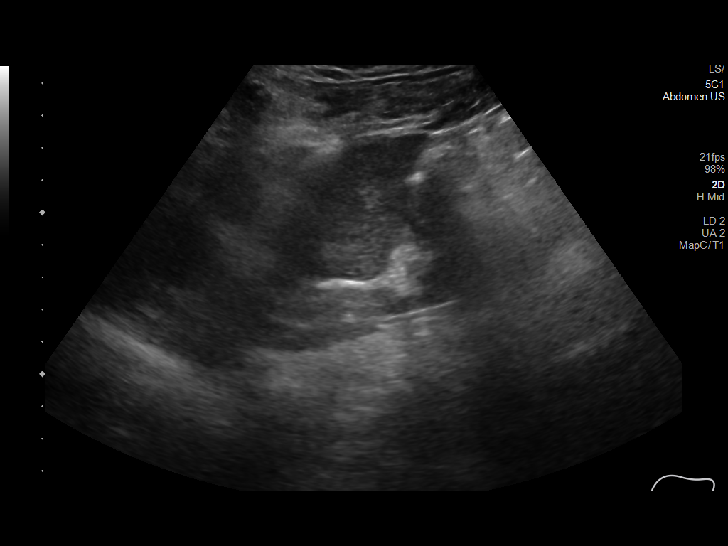
[im 40/48]
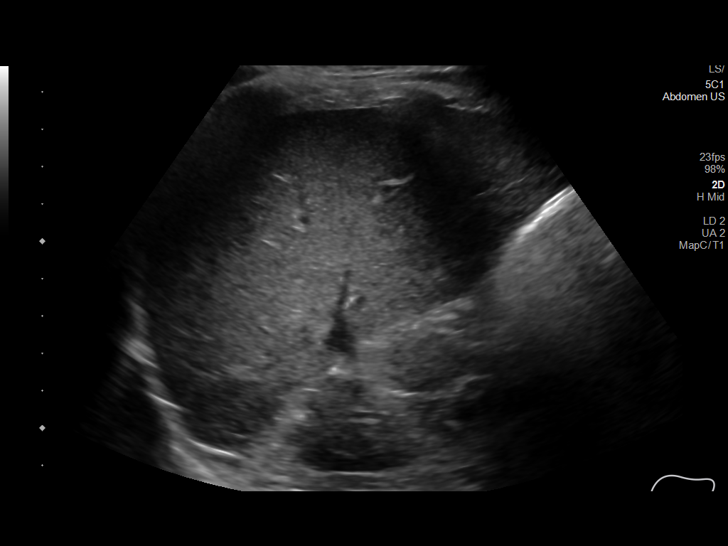
[im 44/48]
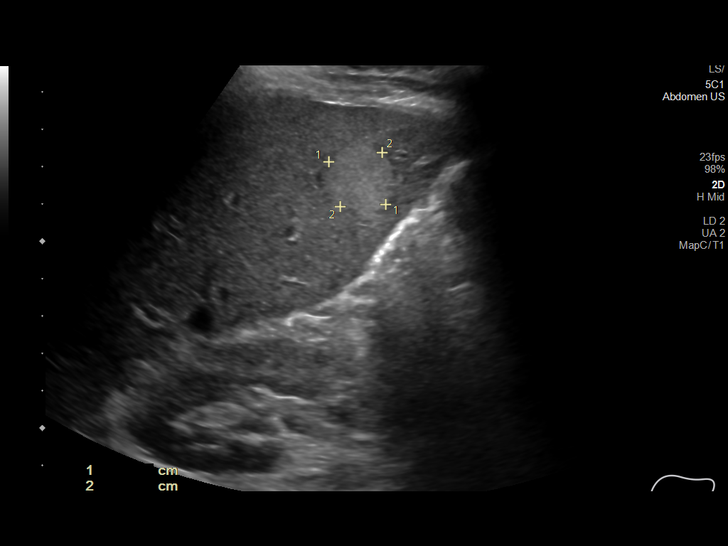
[im 48/48]
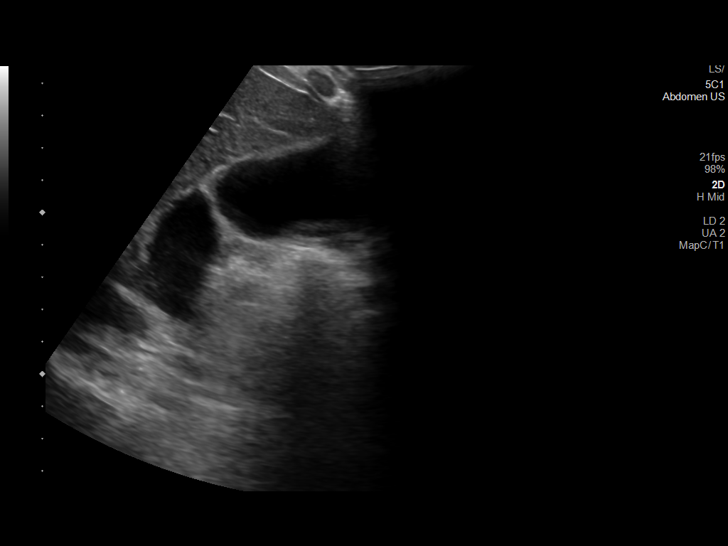

[14 of 25 positions shown; findings below may reference images not displayed]

FINDINGS: Gallbladder:

Layering gallbladder sludge is seen. The gallbladder wall measures
2.4 mm. No sonographic Murphy sign noted by sonographer.

Common bile duct:

Diameter: 4 mm

Liver:

Again noted is a echogenic lesion within the anterior right liver
lobe measuring 1.9 x 1.8 x 2.0 cm. Portal vein is patent on color
Doppler imaging with normal direction of blood flow towards the
liver.

Other: None.
IMPRESSION: Gallbladder sludge.  No evidence of acute cholecystitis

Stable lesion within the right hepatic lobe measuring 1.8 x 2.0 cm

## 2020-11-22 MED ORDER — ONDANSETRON HCL 4 MG/2ML IJ SOLN
4.0000 mg | Freq: Once | INTRAMUSCULAR | Status: AC
  Administered 2020-11-22: 4 mg via INTRAVENOUS
  Filled 2020-11-22: qty 2

## 2020-11-22 MED ORDER — IOHEXOL 300 MG/ML  SOLN
80.0000 mL | Freq: Once | INTRAMUSCULAR | Status: AC | PRN
  Administered 2020-11-22: 80 mL via INTRAVENOUS

## 2020-11-22 MED ORDER — FENTANYL CITRATE (PF) 100 MCG/2ML IJ SOLN
50.0000 ug | Freq: Once | INTRAMUSCULAR | Status: AC
  Administered 2020-11-22: 50 ug via INTRAVENOUS
  Filled 2020-11-22: qty 2

## 2020-11-22 MED ORDER — SODIUM CHLORIDE 0.9 % IV BOLUS
500.0000 mL | Freq: Once | INTRAVENOUS | Status: AC
  Administered 2020-11-22: 500 mL via INTRAVENOUS

## 2020-11-22 NOTE — ED Notes (Signed)
MD at bedside. 

## 2020-11-22 NOTE — ED Provider Notes (Signed)
[facility]GENCY DEPARTMENT Provider Note   CSN: [id number] Arrival date & time: 11/22/20  1847     History Chief Complaint  Patient presents with  . Abdominal Pain  . Nausea    Ryan Wilkerson is a 75 y.o. male.  The history is provided by the patient, medical records and a relative.  Abdominal Pain  Ryan Wilkerson is a 75 y.o. male who presents to the Emergency Department complaining of abdominal pain. He presents the emergency department complaining of acute on chronic abdominal pain. He states that he has been experiencing intermittent abdominal pain for several years. He gets sharp episodes that radiate across his entire abdomen but originate in the RUQ.  No clear triggers but he reliably feels gurgling in his abdomen before episodes of pain begin. Normally they resolve in a few hours without intervention. His most recent episode started on Tuesday. He has persistent pain since that time with associated nausea. He denies any fevers, chest pain, shortness of breath, vomiting. He does have constipation. He does have a history of neuroendocrine tumor and is currently on chemotherapy. No prior abdominal surgeries.    Past Medical History:  Diagnosis Date  . Anemia   . CVA (cerebral vascular accident) ([place])   . Erectile dysfunction   . Essential tremor   . Family history of breast cancer   . Family history of prostate cancer   . Family history of stomach cancer   . GERD (gastroesophageal reflux disease)   . History of colon polyps   . Hypercholesterolemia   . Hypertension   . 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 Nocturia   . Pericardial effusion   . Pericarditis   . Prostate cancer [facility]) urologist-- dr Tresa Moore /  oncologist-- dr manning   prostate bx 06-24-2016 and 09-15-2017 at dr Tresa Moore office  Stage T1c, Gleason 4+3, PSA 8.48, vol 33cc---  planned external beam radiation therpay  . Tobacco abuse   . Urgency of urination   .  Wears glasses     Patient Active Problem List   Diagnosis Date Noted  . ST elevation myocardial infarction (STEMI) ([place])   . Acute idiopathic pericarditis   . Chest pain 05/12/2020  . Chest pain of uncertain etiology   . 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 Goals of care, counseling/discussion 05/01/2020  . Genetic testing 11/13/2017  . Family history of prostate cancer   . Family history of breast cancer   . Family history of stomach cancer   . Malignant neoplasm of prostate ([place]) 08/31/2017    Past Surgical History:  Procedure Laterality Date  . COLONOSCOPY  last one 06/ 2018  . GOLD SEED IMPLANT N/A 12/23/2017   Procedure: GOLD SEED IMPLANT;  Surgeon: Alexis Frock, MD;  Location: [facility];  Service: Urology;  Laterality: N/A;  . LEFT HEART CATH AND CORONARY ANGIOGRAPHY N/A 05/12/2020   Procedure: LEFT HEART CATH AND CORONARY ANGIOGRAPHY;  Surgeon: Burnell Blanks, MD;  Location: [facility];  Service: Cardiovascular;  Laterality: N/A;  . PROSTATE BIOPSY  06-24-2016;  09-15-2017-- at dr Tresa Moore office  . SPACE OAR INSTILLATION N/A 12/23/2017   Procedure: SPACE OAR INSTILLATION;  Surgeon: Alexis Frock, MD;  Location: [facility];  Service: Urology;  Laterality: N/A;       Family History  Problem Relation Age of Onset  . Prostate cancer Brother 76  . Prostate cancer Brother  metastatic/ late treatment dx in 60's/70's  . Breast cancer Sister 8  . Prostate cancer Brother        dx in 60's/70's  . Prostate cancer Brother        dx 60's/70's  . Prostate cancer Brother        dx 60's/70's  . Prostate cancer Brother        63's  . Lung cancer Brother   . Prostate cancer Other   . Prostate cancer Other   . Lung cancer Sister     Social History   Tobacco Use  . Smoking status: Former Smoker    Packs/day: 0.50    Years: 18.00    Pack years: 9.00    Types: Cigarettes    Quit  date: 05/02/1974    Years since quitting: 46.5  . Smokeless tobacco: Never Used  Vaping Use  . Vaping Use: Never used  Substance Use Topics  . Alcohol use: No  . Drug use: No    Home Medications Prior to Admission medications   Medication Sig Start Date End Date Taking? Authorizing Provider  acetaminophen (TYLENOL) 500 MG tablet Take 1,000 mg by mouth every 6 (six) hours as needed for headache (pain).     [provider]  budesonide-formoterol [facility]) 160-4.5 MCG/ACT inhaler 2 puffs 05/31/20   [provider]  clopidogrel (PLAVIX) 75 MG tablet Take 75 mg by mouth daily.     [provider]  fluticasone (FLONASE) 50 MCG/ACT nasal spray Place 2 sprays into both nostrils daily. 03/18/19   Fawze, Mina A, PA-C  lisinopril-hydrochlorothiazide (PRINZIDE,ZESTORETIC) 20-12.5 MG tablet Take 0.5 tablets by mouth every morning.     [provider]  loratadine (CLARITIN) 10 MG tablet Take 10 mg by mouth every morning.     [provider]  OVER THE COUNTER MEDICATION Place 1 drop into both eyes daily as needed (dry eyes). Over the counter lubricating eye drop     [provider]  pantoprazole (PROTONIX) 40 MG tablet Take 1 tablet (40 mg total) by mouth daily. 05/15/20   Cheryln Manly, NP  polyethylene glycol powder (GLYCOLAX/MIRALAX) 17 GM/SCOOP powder 1 dose 08/03/20   [provider]  pravastatin (PRAVACHOL) 40 MG tablet Take 40 mg by mouth every morning.     [provider]  SYMBICORT 160-4.5 MCG/ACT inhaler Inhale 1-2 puffs into the lungs as needed. 05/31/20   [provider]  traMADol (ULTRAM) 50 MG tablet Take 50-100 mg by mouth every 6 (six) hours as needed for moderate pain or severe pain.     [provider]    Allergies    Aspirin and Simvastatin  Review of Systems   Review of Systems  Gastrointestinal: Positive for abdominal pain.  All other systems reviewed and are negative.   Physical  Exam Updated Vital Signs BP (!) 143/65 (BP Location: Right Arm)   Pulse 66   Temp 99.7 F (37.6 C) (Oral)   Resp 19   Ht 5\' 8"  (1.727 m)   Wt 70.3 kg   SpO2 98%   BMI 23.57 kg/m   Physical Exam Vitals and nursing note reviewed.  Constitutional:      General: He is in acute distress.     Appearance: He is well-developed. He is ill-appearing.  HENT:     Head: Normocephalic and atraumatic.  Cardiovascular:     Rate and Rhythm: Regular rhythm. Tachycardia present.     Heart sounds: No murmur heard.  Pulmonary:     Effort: Pulmonary effort is normal. No respiratory distress.     Breath sounds: Normal breath sounds.  Abdominal:     Tenderness: There is abdominal tenderness. There is no rebound.     Comments: Generalized abdominal tenderness with voluntary guarding across the upper abdomen  Musculoskeletal:        General: No swelling or tenderness.  Skin:    General: Skin is warm and dry.  Neurological:     Mental Status: He is alert and oriented to person, place, and time.  Psychiatric:        Behavior: Behavior normal.     ED Results / Procedures / Treatments   Labs (all labs ordered are listed, but only abnormal results are displayed) Labs Reviewed  COMPREHENSIVE METABOLIC PANEL - Abnormal; Notable for the following components:      Result Value   Glucose, Bld 133 (*)    Creatinine, Ser 1.27 (*)    GFR, Estimated 59 (*)    All other components within normal limits  CBC - Abnormal; Notable for the following components:   WBC 14.0 (*)    RBC 4.02 (*)    Hemoglobin 12.2 (*)    HCT 36.5 (*)    All other components within normal limits  URINALYSIS, ROUTINE W REFLEX MICROSCOPIC - Abnormal; Notable for the following components:   APPearance HAZY (*)    Hgb urine dipstick MODERATE (*)    Ketones, ur 20 (*)    Bacteria, UA RARE (*)    All other components within normal limits  TROPONIN I (HIGH SENSITIVITY) - Abnormal; Notable for the following components:    Troponin I (High Sensitivity) 29 (*)    All other components within normal limits  SARS CORONAVIRUS 2 (TAT 6-24 HRS)  LIPASE, BLOOD    EKG None  Radiology CT Abdomen Pelvis W Contrast  Result Date: 11/22/2020 CLINICAL DATA:  Abdominal pain EXAM: CT ABDOMEN AND PELVIS WITH CONTRAST TECHNIQUE: Multidetector CT imaging of the abdomen and pelvis was performed using the standard protocol following bolus administration of intravenous contrast. CONTRAST:  26mL OMNIPAQUE IOHEXOL 300 MG/ML  SOLN COMPARISON:  07/31/2020 FINDINGS: Lower chest: Lung bases are clear. No effusions. Heart is normal size. Hepatobiliary: Low-density area posteriorly in the liver measures 15 mm. 18 mm lesion in the inferior right hepatic lobe. These areas are stable when compared to prior MRI. Gallbladder unremarkable. Pancreas: No focal abnormality or ductal dilatation. Spleen: No focal abnormality.  Normal size. Adrenals/Urinary Tract: No adrenal mass. No hydronephrosis or suspicious renal mass. Urinary bladder unremarkable. Stomach/Bowel: Moderate stool in the colon. Stomach, large and small bowel grossly unremarkable. Vascular/Lymphatic: Aortic atherosclerosis. No evidence of aneurysm or adenopathy. Reproductive: Prostate enlargement. Other: No evidence of aneurysm or adenopathy. Musculoskeletal: No acute bony abnormality. IMPRESSION: 15 mm and 18 mm lesions within the right hepatic lobe, stable since prior MRI. Moderate stool throughout the colon. Aortic atherosclerosis. Prostate enlargement. No acute findings in the abdomen or pelvis. Electronically Signed   By: Rolm Baptise M.D.   On: 11/22/2020 22:38   US Abdomen Limited RUQ (LIVER/GB)  Result Date: 11/22/2020 CLINICAL DATA:  Right upper quadrant pain EXAM: ULTRASOUND ABDOMEN LIMITED RIGHT UPPER QUADRANT COMPARISON:  None. FINDINGS: Gallbladder: Layering gallbladder sludge is seen. The gallbladder wall measures 2.4 mm. No sonographic Stahlecker sign noted by sonographer. Common  bile duct: Diameter: 4 mm Liver: Again noted is a echogenic lesion within the anterior right liver lobe measuring 1.9 x 1.8 x 2.0  cm. Portal vein is patent on color Doppler imaging with normal direction of blood flow towards the liver. Other: None. IMPRESSION: Gallbladder sludge.  No evidence of acute cholecystitis Stable lesion within the right hepatic lobe measuring 1.8 x 2.0 cm Electronically Signed   By: Prudencio Pair M.D.   On: 11/22/2020 23:44    Procedures Procedures   Medications Ordered in ED Medications  fentaNYL (SUBLIMAZE) injection 50 mcg (50 mcg Intravenous Given 11/22/20 2124)  sodium chloride 0.9 % bolus 500 mL (0 mLs Intravenous Stopped 11/22/20 2157)  ondansetron (ZOFRAN) injection 4 mg (4 mg Intravenous Given 11/22/20 2124)  iohexol (OMNIPAQUE) 300 MG/ML solution 80 mL (80 mLs Intravenous Contrast Given 11/22/20 2228)    ED Course  I have reviewed the triage vital signs and the nursing notes.  Pertinent labs & imaging results that were available during my care of the patient were reviewed by me and considered in my medical decision making (see chart for details).    MDM Rules/Calculators/A&P                         patient here for evaluation of acute on chronic abdominal pain, has a history of neuroendocrine tumor. On initial presentation patient was significant severe generalized abdominal pain. After pain medications he is feeling improved but has persistent right upper quadrant abdominal pain. CBC with leukocytosis when compared to recent. CT abdomen pelvis was obtained, which is stable compared to priors with no evidence of obstruction. Given focal right upper quadrant tenderness on repeat assessment and ultrasound was obtained, which is negative for cholecystitis but does demonstrate some gallbladder sludge. Given abdominal pain, leukocytosis recommend observation for further evaluation, possible HIDA scan. Troponin is mildly elevated, patient without active chest pain,  recommend trending. Hospitalist consulted for admission for further evaluation.  Final Clinical Impression(s) / ED Diagnoses Final diagnoses:  RUQ abdominal pain  Right upper quadrant abdominal pain    Rx / DC Orders ED Discharge Orders    None       Quintella Reichert, MD 11/23/20 0025

## 2020-11-22 NOTE — Telephone Encounter (Signed)
We received a call from Trainer regarding patient's abdominal pain, constipation and foamy phlegm.  I spoke with Patient he has been constipated.  I recommended he use miralax and warm prune juice.  He also said the phlegm is a small appt, infrequent, white in color and occurs when he clears his throat.  He is afebrile and denies cough and shortness of breath.

## 2020-11-22 NOTE — ED Triage Notes (Signed)
Generalized abdominal pain and nausea that's been going on for months but getting progressively worse. Hx of liver, pancreatic and small instestine cancer.    Denies any vomiting. Pt also reports feeling constipated. Last BM was Monday.

## 2020-11-23 ENCOUNTER — Observation Stay (HOSPITAL_COMMUNITY): Payer: Medicare HMO

## 2020-11-23 ENCOUNTER — Encounter (HOSPITAL_COMMUNITY): Payer: Self-pay | Admitting: Internal Medicine

## 2020-11-23 DIAGNOSIS — R1010 Upper abdominal pain, unspecified: Secondary | ICD-10-CM | POA: Diagnosis present

## 2020-11-23 DIAGNOSIS — K5900 Constipation, unspecified: Secondary | ICD-10-CM

## 2020-11-23 DIAGNOSIS — K219 Gastro-esophageal reflux disease without esophagitis: Secondary | ICD-10-CM | POA: Diagnosis not present

## 2020-11-23 DIAGNOSIS — C7B02 Secondary carcinoid tumors of liver: Secondary | ICD-10-CM | POA: Diagnosis not present

## 2020-11-23 DIAGNOSIS — Z87891 Personal history of nicotine dependence: Secondary | ICD-10-CM | POA: Diagnosis not present

## 2020-11-23 DIAGNOSIS — E782 Mixed hyperlipidemia: Secondary | ICD-10-CM

## 2020-11-23 DIAGNOSIS — C7B01 Secondary carcinoid tumors of distant lymph nodes: Secondary | ICD-10-CM

## 2020-11-23 DIAGNOSIS — I10 Essential (primary) hypertension: Secondary | ICD-10-CM | POA: Diagnosis not present

## 2020-11-23 DIAGNOSIS — Z8546 Personal history of malignant neoplasm of prostate: Secondary | ICD-10-CM

## 2020-11-23 DIAGNOSIS — C7B8 Other secondary neuroendocrine tumors: Secondary | ICD-10-CM | POA: Diagnosis not present

## 2020-11-23 DIAGNOSIS — R1011 Right upper quadrant pain: Secondary | ICD-10-CM | POA: Diagnosis not present

## 2020-11-23 DIAGNOSIS — Z79899 Other long term (current) drug therapy: Secondary | ICD-10-CM | POA: Diagnosis not present

## 2020-11-23 DIAGNOSIS — R1030 Lower abdominal pain, unspecified: Secondary | ICD-10-CM | POA: Diagnosis not present

## 2020-11-23 DIAGNOSIS — C61 Malignant neoplasm of prostate: Secondary | ICD-10-CM

## 2020-11-23 DIAGNOSIS — C7A00 Malignant carcinoid tumor of unspecified site: Secondary | ICD-10-CM

## 2020-11-23 DIAGNOSIS — Z8505 Personal history of malignant neoplasm of liver: Secondary | ICD-10-CM | POA: Diagnosis not present

## 2020-11-23 DIAGNOSIS — Z20822 Contact with and (suspected) exposure to covid-19: Secondary | ICD-10-CM | POA: Diagnosis not present

## 2020-11-23 LAB — COMPREHENSIVE METABOLIC PANEL WITH GFR
ALT: 17 U/L (ref 0–44)
AST: 21 U/L (ref 15–41)
Albumin: 3.1 g/dL — ABNORMAL LOW (ref 3.5–5.0)
Alkaline Phosphatase: 35 U/L — ABNORMAL LOW (ref 38–126)
Anion gap: 9 (ref 5–15)
BUN: 8 mg/dL (ref 8–23)
CO2: 26 mmol/L (ref 22–32)
Calcium: 8.6 mg/dL — ABNORMAL LOW (ref 8.9–10.3)
Chloride: 103 mmol/L (ref 98–111)
Creatinine, Ser: 1.19 mg/dL (ref 0.61–1.24)
GFR, Estimated: >60 mL/min
Glucose, Bld: 109 mg/dL — ABNORMAL HIGH (ref 70–99)
Potassium: 3.4 mmol/L — ABNORMAL LOW (ref 3.5–5.1)
Sodium: 138 mmol/L (ref 135–145)
Total Bilirubin: 1 mg/dL (ref 0.3–1.2)
Total Protein: 5.7 g/dL — ABNORMAL LOW (ref 6.5–8.1)

## 2020-11-23 LAB — CBC WITH DIFFERENTIAL/PLATELET
Abs Immature Granulocytes: 0.02 K/uL (ref 0.00–0.07)
Basophils Absolute: 0 K/uL (ref 0.0–0.1)
Basophils Relative: 0 %
Eosinophils Absolute: 0.1 K/uL (ref 0.0–0.5)
Eosinophils Relative: 1 %
HCT: 32.7 % — ABNORMAL LOW (ref 39.0–52.0)
Hemoglobin: 11.2 g/dL — ABNORMAL LOW (ref 13.0–17.0)
Immature Granulocytes: 0 %
Lymphocytes Relative: 19 %
Lymphs Abs: 1.4 K/uL (ref 0.7–4.0)
MCH: 30.8 pg (ref 26.0–34.0)
MCHC: 34.3 g/dL (ref 30.0–36.0)
MCV: 89.8 fL (ref 80.0–100.0)
Monocytes Absolute: 0.8 K/uL (ref 0.1–1.0)
Monocytes Relative: 11 %
Neutro Abs: 5 K/uL (ref 1.7–7.7)
Neutrophils Relative %: 69 %
Platelets: 195 K/uL (ref 150–400)
RBC: 3.64 MIL/uL — ABNORMAL LOW (ref 4.22–5.81)
RDW: 11.5 % (ref 11.5–15.5)
WBC: 7.3 K/uL (ref 4.0–10.5)
nRBC: 0 % (ref 0.0–0.2)

## 2020-11-23 LAB — SARS CORONAVIRUS 2 (TAT 6-24 HRS): SARS Coronavirus 2: NEGATIVE

## 2020-11-23 IMAGING — NM NM HEPATOBILIARY IMAGE, INC GB
1 series · 6 of 6 positions shown · non-contrast
Comparison: Ultrasound on [DATE]

CLINICAL DATA: Right upper quadrant pain.

EXAM:
NUCLEAR MEDICINE HEPATOBILIARY IMAGING
TECHNIQUE: Sequential images of the abdomen were obtained [DATE] minutes
following intravenous administration of radiopharmaceutical.
RADIOPHARMACEUTICALS:  5.5 mCi [8G]  Choletec IV

[he hepatobiliary · 4.52mm/px · 6 of 60 frames shown]
[frame 6/60]
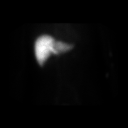
[frame 16/60]
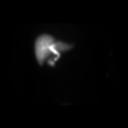
[frame 26/60]
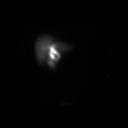
[frame 36/60]
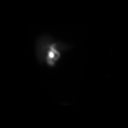
[frame 46/60]
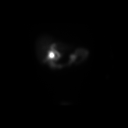
[frame 56/60]
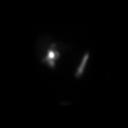

[6 of 6 positions shown; findings below may reference images not displayed]

FINDINGS: Prompt uptake and biliary excretion of activity by the liver is
seen. Gallbladder activity is visualized, consistent with patency of
cystic duct. Biliary activity passes into small bowel, consistent
with patent common bile duct.
IMPRESSION: Normal hepatobiliary scan, demonstrating patency of cystic and
common bile ducts.

## 2020-11-23 MED ORDER — PRAVASTATIN SODIUM 40 MG PO TABS
40.0000 mg | ORAL_TABLET | Freq: Every morning | ORAL | Status: DC
  Administered 2020-11-23 – 2020-11-26 (×4): 40 mg via ORAL
  Filled 2020-11-23 (×4): qty 1

## 2020-11-23 MED ORDER — HYDROMORPHONE HCL 1 MG/ML IJ SOLN
1.0000 mg | INTRAMUSCULAR | Status: DC | PRN
  Administered 2020-11-23: 1 mg via INTRAVENOUS
  Filled 2020-11-23: qty 1

## 2020-11-23 MED ORDER — SENNOSIDES-DOCUSATE SODIUM 8.6-50 MG PO TABS
1.0000 | ORAL_TABLET | Freq: Every day | ORAL | Status: DC
  Administered 2020-11-23 – 2020-11-24 (×2): 1 via ORAL
  Filled 2020-11-23 (×3): qty 1

## 2020-11-23 MED ORDER — ENOXAPARIN SODIUM 40 MG/0.4ML ~~LOC~~ SOLN
40.0000 mg | Freq: Every day | SUBCUTANEOUS | Status: DC
  Administered 2020-11-23 – 2020-11-26 (×4): 40 mg via SUBCUTANEOUS
  Filled 2020-11-23 (×4): qty 0.4

## 2020-11-23 MED ORDER — TECHNETIUM TC 99M MEBROFENIN IV KIT
5.5000 | PACK | Freq: Once | INTRAVENOUS | Status: AC | PRN
  Administered 2020-11-23: 5.5 via INTRAVENOUS

## 2020-11-23 MED ORDER — ACETAMINOPHEN 325 MG PO TABS: 650.0000 mg | ORAL_TABLET | Freq: Four times a day (QID) | ORAL | Status: DC | PRN

## 2020-11-23 MED ORDER — POTASSIUM CHLORIDE CRYS ER 20 MEQ PO TBCR
40.0000 meq | EXTENDED_RELEASE_TABLET | Freq: Once | ORAL | Status: AC
  Administered 2020-11-23: 40 meq via ORAL
  Filled 2020-11-23: qty 2

## 2020-11-23 MED ORDER — SIMETHICONE 80 MG PO CHEW
80.0000 mg | CHEWABLE_TABLET | Freq: Four times a day (QID) | ORAL | Status: DC
  Administered 2020-11-23 – 2020-11-26 (×11): 80 mg via ORAL
  Filled 2020-11-23 (×11): qty 1

## 2020-11-23 MED ORDER — LACTATED RINGERS IV SOLN: INTRAVENOUS | Status: AC

## 2020-11-23 MED ORDER — LISINOPRIL-HYDROCHLOROTHIAZIDE 20-12.5 MG PO TABS: 0.5000 | ORAL_TABLET | Freq: Every morning | ORAL | Status: DC

## 2020-11-23 MED ORDER — HYDROCHLOROTHIAZIDE 10 MG/ML ORAL SUSPENSION
6.2500 mg | Freq: Every day | ORAL | Status: DC
  Administered 2020-11-23 – 2020-11-26 (×4): 6.25 mg via ORAL
  Filled 2020-11-23 (×5): qty 1.25

## 2020-11-23 MED ORDER — OXYCODONE-ACETAMINOPHEN 5-325 MG PO TABS
1.0000 | ORAL_TABLET | ORAL | Status: DC | PRN
  Administered 2020-11-24 (×2): 1 via ORAL
  Filled 2020-11-23 (×2): qty 1

## 2020-11-23 MED ORDER — TRAMADOL HCL 50 MG PO TABS: 50.0000 mg | ORAL_TABLET | Freq: Four times a day (QID) | ORAL | Status: DC | PRN

## 2020-11-23 MED ORDER — LORATADINE 10 MG PO TABS
10.0000 mg | ORAL_TABLET | Freq: Every morning | ORAL | Status: DC
  Administered 2020-11-23 – 2020-11-26 (×4): 10 mg via ORAL
  Filled 2020-11-23 (×4): qty 1

## 2020-11-23 MED ORDER — HYDROCORTISONE ACETATE 25 MG RE SUPP
25.0000 mg | Freq: Two times a day (BID) | RECTAL | Status: DC
  Administered 2020-11-23 – 2020-11-26 (×6): 25 mg via RECTAL
  Filled 2020-11-23 (×6): qty 1

## 2020-11-23 MED ORDER — ONDANSETRON HCL 4 MG/2ML IJ SOLN: 4.0000 mg | Freq: Four times a day (QID) | INTRAMUSCULAR | Status: DC | PRN

## 2020-11-23 MED ORDER — CLOPIDOGREL BISULFATE 75 MG PO TABS
75.0000 mg | ORAL_TABLET | Freq: Every day | ORAL | Status: DC
  Administered 2020-11-23 – 2020-11-26 (×4): 75 mg via ORAL
  Filled 2020-11-23 (×4): qty 1

## 2020-11-23 MED ORDER — SORBITOL 70 % SOLN
960.0000 mL | TOPICAL_OIL | Freq: Once | ORAL | Status: AC
  Administered 2020-11-23: 960 mL via RECTAL
  Filled 2020-11-23: qty 473

## 2020-11-23 MED ORDER — MORPHINE SULFATE (PF) 4 MG/ML IV SOLN
4.0000 mg | INTRAVENOUS | Status: DC | PRN
  Administered 2020-11-23 (×2): 4 mg via INTRAVENOUS
  Filled 2020-11-23 (×2): qty 1

## 2020-11-23 MED ORDER — POLYETHYLENE GLYCOL 3350 17 G PO PACK
17.0000 g | PACK | Freq: Every day | ORAL | Status: DC
  Administered 2020-11-23: 17 g via ORAL
  Filled 2020-11-23: qty 1

## 2020-11-23 MED ORDER — POLYETHYLENE GLYCOL 3350 17 G PO PACK
17.0000 g | PACK | Freq: Two times a day (BID) | ORAL | Status: DC
  Administered 2020-11-23 – 2020-11-24 (×2): 17 g via ORAL
  Filled 2020-11-23 (×2): qty 1

## 2020-11-23 MED ORDER — FLUTICASONE PROPIONATE 50 MCG/ACT NA SUSP
2.0000 | Freq: Every day | NASAL | Status: DC
  Administered 2020-11-23 – 2020-11-26 (×4): 2 via NASAL
  Filled 2020-11-23: qty 16

## 2020-11-23 MED ORDER — DICYCLOMINE HCL 10 MG PO CAPS
10.0000 mg | ORAL_CAPSULE | Freq: Three times a day (TID) | ORAL | Status: DC | PRN
  Administered 2020-11-23 – 2020-11-24 (×2): 10 mg via ORAL
  Filled 2020-11-23 (×2): qty 1

## 2020-11-23 MED ORDER — SIMETHICONE 80 MG PO CHEW
80.0000 mg | CHEWABLE_TABLET | Freq: Four times a day (QID) | ORAL | Status: DC | PRN
  Administered 2020-11-23: 80 mg via ORAL
  Filled 2020-11-23: qty 1

## 2020-11-23 MED ORDER — ACETAMINOPHEN 650 MG RE SUPP: 650.0000 mg | Freq: Four times a day (QID) | RECTAL | Status: DC | PRN

## 2020-11-23 MED ORDER — ONDANSETRON HCL 4 MG PO TABS: 4.0000 mg | ORAL_TABLET | Freq: Four times a day (QID) | ORAL | Status: DC | PRN

## 2020-11-23 MED ORDER — PANTOPRAZOLE SODIUM 40 MG PO TBEC
40.0000 mg | DELAYED_RELEASE_TABLET | Freq: Every day | ORAL | Status: DC
  Administered 2020-11-23 – 2020-11-26 (×4): 40 mg via ORAL
  Filled 2020-11-23 (×4): qty 1

## 2020-11-23 MED ORDER — LISINOPRIL 10 MG PO TABS
10.0000 mg | ORAL_TABLET | Freq: Every day | ORAL | Status: DC
  Administered 2020-11-23 – 2020-11-26 (×4): 10 mg via ORAL
  Filled 2020-11-23 (×4): qty 1

## 2020-11-23 NOTE — Progress Notes (Signed)
  PROGRESS NOTE  Patient admitted earlier this morning. See H&P.   Ryan Wilkerson is a 75 yo male with past medical history significant of metastatic neuroendocrine tumor, hypertension, hyperlipidemia, history of a stroke, prostate cancer, GERD who presented to the hospital with complaints of abdominal pain.  For several years, patient has been experiencing intermittent bouts of abdominal pain, but worsening recently.  He admits to right upper quadrant abdominal pain that feels like is in ice pick stabbing him, also admitting to some lower abdominal cramping.  In the emergency department, work-up was concerning for gallbladder sludge, constipation.  Patient had a bowel movement after enema yesterday.  No right upper quadrant abdominal pain on examination this morning.  Daughter is at bedside who is quite concerned regarding patient's current illness, what this may mean for further oncologic treatments, requesting medical records.  A/P:  Intractable abdominal pain, acute on chronic -Unclear etiology, had complaints of RUQ abdominal pain -CT A/P: No acute findings in the abdomen or pelvis, 15 mm and 18 mm lesions within the right hepatic lobe, stable since prior MRI. Moderate stool throughout the colon. -RUQ [place]: Gallbladder sludge.  No evidence of acute cholecystitis -HIDA scan: Normal hepatobiliary scan, demonstrating patency of cystic and common bile ducts -LFT, lipase normal -Could be secondary to progression of his metastatic neuroendocrine tumor, referred pain from constipation -Patient requiring IV pain control, IVF   Constipation -Resolved after enema  Metastatic malignant neuroendocrine tumor to the liver -Followed by Dr. Burr Medico, oncology will evaluate patient later today  Hypertension -Continue lisinopril/HCTZ   GERD -Continue PPI   HLD -Continue pravachol   Hx CVA -Continue plavix   Hypokalemia -Replace, trend    Status is: Observation  The patient will require  care spanning > 2 midnights and should be moved to inpatient because: Ongoing active pain requiring inpatient pain management  Dispo: The patient is from: Home              Anticipated d/c is to: Home              Patient currently is not medically stable to d/c.  Still requiring IV pain medication.   Difficult to place patient No       Dessa Phi, DO [facility] 11/23/2020, 12:25 PM  Available via Epic secure chat 7am-7pm After these hours, please refer to coverage provider listed on [email or web address]

## 2020-11-23 NOTE — ED Notes (Signed)
Report called to RN on 6th floor, questions answered.

## 2020-11-23 NOTE — Plan of Care (Signed)

## 2020-11-23 NOTE — Progress Notes (Addendum)
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  The patient is currently receiving treatment with lanreotide and has received 2 doses to date.  Last dose was given 11/15/2020.  He presented to the emergency room with abdominal pain and nausea.  On admission, his CBC showed a WBC of 14.0, hemoglobin 12.2, glucose 133, creatinine 1.27.  He had a CT of the abdomen/pelvis with contrast which showed 15 mm and 18 mm lesions within the right hepatic lobe which are stable since the prior MRI, moderate stool throughout the colon, enlarged prostate.  There were no acute findings in the abdomen or pelvis.  An ultrasound of his right upper quadrant was obtained which showed gallbladder sludge and no evidence of acute cholecystitis, stable lesion within the right hepatic lobe measuring 1.8 x 2.0 cm.  The patient had a HIDA scan performed earlier today which showed normal hepatobiliary scan demonstrating patency of cystic and common bile ducts.  The patient was seen in his hospital room today. His daughter, Ryan Wilkerson is at the bedside.  The patient reports that he has ongoing abdominal pain.  Today his pain has moved from the upper abdomen to just below the navel.  He reports that his pain comes as a tight/cramping sensation about every 6 to 7 minutes.  He is taking pain medication which is giving him some relief.  He had significant constipation and he received a smog enema.  He had good results.  However, he still reports that he is not passing any flatus.  He has some reflux symptoms as well.  He is on pantoprazole.  He is not having any nausea or vomiting.  He had a small amount of blood in his stool with a bowel movement.  He states that he sometimes strains and gets hemorrhoids.  He denies any black stools.  He offers no other complaints such as fevers, chills, headaches, dizziness, chest pain, shortness of  breath.  The patient's daughter tells me that the [place] has recommended that he have a colonoscopy due to severe constipation in the past.  However, they have been having some difficulty getting this scheduled.  His previous colonoscopy was performed to [facility].  Oncology History Overview Note  Cancer Staging No matching staging information was found for the patient.    Malignant neoplasm of prostate ([place])  08/31/2017 Initial Diagnosis   Malignant neoplasm of prostate ([place])   10/28/2017 Genetic Testing   The patient had genetic testing due to a personal history of prostate cancer and a family history of breast and stomach cancer.  The Common Hereditary Cancers Panel + Prostate Cancer Panel was ordered.  APC, ATM, AXIN2, BARD1, BMPR1A, BRCA1, BRCA2, BRIP1, CDH1, CDK4, CDKN2A (p14ARF), CDKN2A (p16INK4a), CHEK2, CTNNA1, DICER1, EPCAM*, FANCA, GREM1*, KIT, MEN1, MLH1, MSH2, MSH3, MSH6, MUTYH, NBN, NF1, PALB2, PDGFRA, PMS2, POLD1, POLE, PTEN, RAD50, RAD51C, RAD51D, SDHB, SDHC, SDHD, SMAD4, SMARCA4, STK11, TP53, TSC1, TSC2, VHL. The following genes were evaluated for sequence changes only: HOXB13*, NTHL1*, SDHA.   Results: Negative, no pathogenic variants identified. The date of this test report is 10/28/2017.    01/05/2018 - 02/12/2018 Radiation Therapy   S/p radiation to the prostate 70 Gy in 28 fractions from 01/05/2018-02/12/2018 per Dr. 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 2.5 x 2.0 x 3.0 cm soft tissue lesion in the central small bowel mesentery with  associated dystrophic calcification and retraction of adjacent small bowel loops features highly suspicious for metastatic carcinoid tumor.  Upper normal 9 mm short axis lymph node in the adjacent mesentery 2. 2.  1.6 x 1.4 cm heterogeneous, poorly defined lesion in the inferior right liver suspicious for metastatic disease, the differential includes GI primary or metastatic prostate  cancer 3. No retroperitoneal or pelvic sidewall lymphadenopathy   04/01/2020 PET scan   IMPRESSION: 1. Central mesenteric mass with intense radiotracer activity consistent well differentiated neuroendocrine tumor 2. 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 3. Small peritoneal/nodal metastasis along the LEFT iliac vessels. 4. Very small lesion in the pancreas with intermediate activity could represent primary lesion. No primary bowel lesion identified. 5. Small metastatic implant within the pericardium adjacent LEFT heart ventricle. 6. No skeletal metastasis.   04/30/2020 Pathology Results   FINAL MICROSCOPIC DIAGNOSIS:  A. 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  Tumor cells are negative  for TTF-1.  The findings are consistent with neuroendocrine tumor of  gastrointestinal origin.  Ki-67 stain shows a proliferative index of  about 1%, consistent with well-differentiated, grade 1 tumor.    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 Held after cycle 1 due to suspected pericarditis and deconditioning.           --Changed to monthly Lanreotide on 10/18/20.        REVIEW OF SYSTEMS:   Constitutional: Denies fevers, chills  Eyes: Denies blurriness of vision Ears, nose, mouth, throat, and face: Denies mucositis or sore throat Respiratory: Denies cough, dyspnea or wheezes Cardiovascular: Denies palpitation, chest discomfort Gastrointestinal: Reports abdominal cramping just below the umbilicus, denies nausea vomiting Skin: Denies abnormal skin rashes Lymphatics: Denies new lymphadenopathy or easy bruising Neurological:Denies numbness, tingling or new  weaknesses Behavioral/Psych: Mood is stable, no new changes  Extremities: No lower extremity edema All other systems were reviewed with the patient and are negative.  I have reviewed the past medical history, past surgical history, social history and family history with the patient and they are unchanged from previous note.   PHYSICAL EXAMINATION: ECOG PERFORMANCE STATUS: 1 - Symptomatic but completely ambulatory  Vitals:   11/22/20 2310 11/23/20 0154  BP: (!) 143/65 (!) 155/81  Pulse: 66 (!) 55  Resp: 19 18  Temp:  98.3 F (36.8 C)  SpO2: 98% 100%   Filed Weights   11/22/20 1916 11/23/20 0154  Weight: 70.3 kg 67.7 kg    Intake/Output from previous day: 03/24 0701 - 03/25 0700 In: 500 [IV Piggyback:500] Out: 500 [Urine:500]  GENERAL:alert, no distress and comfortable SKIN: skin color, texture, turgor are normal, no rashes or significant lesions EYES: normal, Conjunctiva are pink and non-injected, sclera clear OROPHARYNX:no exudate, no erythema and lips, buccal mucosa, and tongue normal  LUNGS: clear to auscultation and percussion with normal breathing effort HEART: regular rate & rhythm and no murmurs and no lower extremity edema ABDOMEN: Positive bowel sounds, soft, tenderness over the left lower quadrant, no guarding or rebound tenderness NEURO: alert & oriented x 3 with fluent speech, no focal motor/sensory deficits  LABORATORY DATA:  I have reviewed the data as listed CMP Latest Ref Rng & Units 11/23/2020 11/22/2020 11/15/2020  Glucose 70 - 99 mg/dL 109(H) 133(H)  119(H)  BUN 8 - 23 mg/dL _0 Creatinine 0.61 - 1.24 mg/dL 1.19 1.27(H) 1.36(H)  Sodium 135 - 145 mmol/L 138 138 141  Potassium 3.5 - 5.1 mmol/L 3.4(L) 3.8 4.2  Chloride 98 - 111 mmol/L 103 101 103  CO2 22 - 32 mmol/L _1 Calcium 8.9 - 10.3 mg/dL 8.6(L) 9.3 9.3  Total Protein 6.5 - 8.1 g/dL 5.7(L) 6.8 7.1  Total Bilirubin 0.3 - 1.2 mg/dL 1.0 0.6 0.7  Alkaline Phos 38 - 126 U/L 35(L) 45 52  AST  15 - 41 U/L _2 ALT 0 - 44 U/L _3 Lab Results  Component Value Date   WBC 7.3 11/23/2020   HGB 11.2 (L) 11/23/2020   HCT 32.7 (L) 11/23/2020   MCV 89.8 11/23/2020   PLT 195 11/23/2020   NEUTROABS 5.0 11/23/2020    NM Hepatobiliary Liver Func  Result Date: 11/23/2020 CLINICAL DATA:  Right upper quadrant pain. EXAM: NUCLEAR MEDICINE HEPATOBILIARY IMAGING TECHNIQUE: Sequential images of the abdomen were obtained out to 60 minutes following intravenous administration of radiopharmaceutical. RADIOPHARMACEUTICALS:  5.5 mCi Tc-78m Choletec IV COMPARISON:  Ultrasound on 11/22/2020 FINDINGS: Prompt uptake and biliary excretion of activity by the liver is seen. Gallbladder activity is visualized, consistent with patency of cystic duct. Biliary activity passes into small bowel, consistent with patent common bile duct. IMPRESSION: Normal hepatobiliary scan, demonstrating patency of cystic and common bile ducts. Electronically Signed   By: JMarlaine HindM.D.   On: 11/23/2020 12:11   CT Abdomen Pelvis W Contrast  Result Date: 11/22/2020 CLINICAL DATA:  Abdominal pain EXAM: CT ABDOMEN AND PELVIS WITH CONTRAST TECHNIQUE: Multidetector CT imaging of the abdomen and pelvis was performed using the standard protocol following bolus administration of intravenous contrast. CONTRAST:  877mOMNIPAQUE IOHEXOL 300 MG/ML  SOLN COMPARISON:  07/31/2020 FINDINGS: Lower chest: Lung bases are clear. No effusions. Heart is normal size. Hepatobiliary: Low-density area posteriorly in the liver measures 15 mm. 18 mm lesion in the inferior right hepatic lobe. These areas are stable when compared to prior MRI. Gallbladder unremarkable. Pancreas: No focal abnormality or ductal dilatation. Spleen: No focal abnormality.  Normal size. Adrenals/Urinary Tract: No adrenal mass. No hydronephrosis or suspicious renal mass. Urinary bladder unremarkable. Stomach/Bowel: Moderate stool in the colon. Stomach, large and small bowel  grossly unremarkable. Vascular/Lymphatic: Aortic atherosclerosis. No evidence of aneurysm or adenopathy. Reproductive: Prostate enlargement. Other: No evidence of aneurysm or adenopathy. Musculoskeletal: No acute bony abnormality. IMPRESSION: 15 mm and 18 mm lesions within the right hepatic lobe, stable since prior MRI. Moderate stool throughout the colon. Aortic atherosclerosis. Prostate enlargement. No acute findings in the abdomen or pelvis. Electronically Signed   By: KeRolm Baptise.D.   On: 11/22/2020 22:38   USKoreabdomen Limited RUQ (LIVER/GB)  Result Date: 11/22/2020 CLINICAL DATA:  Right upper quadrant pain EXAM: ULTRASOUND ABDOMEN LIMITED RIGHT UPPER QUADRANT COMPARISON:  None. FINDINGS: Gallbladder: Layering gallbladder sludge is seen. The gallbladder wall measures 2.4 mm. No sonographic Hair sign noted by sonographer. Common bile duct: Diameter: 4 mm Liver: Again noted is a echogenic lesion within the anterior right liver lobe measuring 1.9 x 1.8 x 2.0 cm. Portal vein is patent on color Doppler imaging with normal direction of blood flow towards the liver. Other: None. IMPRESSION: Gallbladder sludge.  No evidence of acute cholecystitis Stable lesion within the right hepatic lobe measuring 1.8 x 2.0 cm Electronically Signed   By: BiKerby Moors  Avutu M.D.   On: 11/22/2020 23:44    ASSESSMENT AND PLAN: 1.  Well-differentiated neuroendocrine tumor with liver and nodal metastasis 2.  Right upper quadrant abdominal pain 3.  Constipation 4.  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  Hypertension 6.  GERD 7.  Hyperlipidemia 8.  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  He discussed his imaging findings.  Based on the CT scan, his hepatic lesions overall appear stable.  I do not see any obvious cause for his abdominal pain on the CT scan other than  constipation. -The patient daughter is indicated that he has developed worsening constipation since being on lanreotide.  He stopped his regular MiraLAX when he started this drug due to concern for diarrhea as a side effect. -Agree with restarting MiraLAX.  I have increased this to twice a day.  He is also receiving Senokot-S and will continue this. -He complains of feeling like he is having some gas pains.  He has simethicone ordered as needed.  I have changed this to 4 times a day as a scheduled medication. -The patient's daughter indicates that he is having difficulty scheduling a follow-up colonoscopy.  We can help facilitate a referral to [facility] as an outpatient.   LOS: 0 days   [name], DNP, AGPCNP-BC, AOCNP 11/23/20   Addendum I have seen the patient, examined him. I agree with the assessment and and plan and have edited the notes.   Pt was in frequent abdominal cramps when I saw him. I reviewed his recent CT scan, abdominal ultrasound, and a HIDA scan findings and discussed with patient.  No evidence of acute cholecystitis, or other etiology on scan to explain his pain.  His metastatic neuroendocrine tumor has been stable overall, with low tumor burden.  I think his pain is likely related to constipation, or side effect of lanreotide.  His last colonoscopy was in 2018.  He sees Dr. Cristina Gong.  If his pain does not improve, please consider consulting [facility].  I will order Bentyl as needed for his cramps.  We have increased his laxative, may need more until he has good BM. I will f/u as needed. All questions were answered.  Truitt Merle  11/23/2020

## 2020-11-23 NOTE — H&P (Signed)
History and Physical    Ryan Wilkerson XVQ:[id number] DOB: 05/17/1946 DOA: 11/22/2020  PCP: Alroy Dust, L.Marlou Sa, MD  Patient coming from: Home   Chief Complaint:  Chief Complaint  Patient presents with  . Abdominal Pain  . Nausea     HPI:    74 year old male with past medical history of metastatic neuroendocrine tumor, gastroesophageal reflux disease, hypertension, hyperlipidemia, previous stroke, prostate cancer and longstanding history of chronic abdominal pain who presents to [facility] emergency department with complaints of abdominal pain.  Patient explains that for several years he has been experiencing intermittent bouts of abdominal pain.  Patient has a longstanding history of neuroendocrine tumor, metastatic to the liver and is currently following as an outpatient with Dr. Burr Medico.    Patient explains that for approximately the past he has been experiencing episodes of abdominal pain that are much worse than his baseline.  Patient explains each episode lasts anywhere between 4 and 6 hours.  Pain is severe in intensity, located in the epigastric and right upper quadrant regions and is essentially nonradiating.  Patient does not seem to associate his episodes of pain with food consumption.  Patient complains of ongoing nausea and one episode of vomiting.  Patient also endorses a several month history of unintentional weight loss.  Patient denies any dysuria or fever.  Upon further questioning patient also reports not having moved his bowels in over 5 days.  Patient states that he has had ongoing problems with constipation and takes daily MiraLAX.  For the past 5 days this daily MiraLAX consumption has proved unsuccessful.  Because of patient's severe episodes of abdominal pain, increasing in frequency and severity the patient eventually presented to [facility] emergency department for evaluation.  Upon evaluation in the emergency department, patient was administered  fentanyl with some improvement in symptoms.  Lipase, hepatic function panel and urinalysis were all found to be unremarkable.  CT imaging of the abdomen and pelvis were found to be unremarkable.  Right upper quadrant ultrasound has demonstrated gallbladder sludge without definitive stones as well as identification of stable lesions of the liver measuring 15 mm and 18 mm.  Despite this negative work-up patient continued to experience severe abdominal pain and therefore the hospitalist group has been called to assess the patient for admission to the hospital.  Review of Systems:   Review of Systems  Constitutional: Positive for malaise/fatigue and weight loss.  Gastrointestinal: Positive for abdominal pain, nausea and vomiting.  All other systems reviewed and are negative.   Past Medical History:  Diagnosis Date  . Acute idiopathic pericarditis   . Anemia   . CVA (cerebral vascular accident) ([place])   . Erectile dysfunction   . Essential tremor   . Family history of breast cancer   . Family history of prostate cancer   . Family history of stomach cancer   . GERD (gastroesophageal reflux disease)   . History of colon polyps   . Hypercholesterolemia   . Hypertension   . 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 Nocturia   . Pericardial effusion   . Pericarditis   . Prostate cancer [facility]) urologist-- dr Tresa Moore /  oncologist-- dr manning   prostate bx 06-24-2016 and 09-15-2017 at dr Tresa Moore office  Stage T1c, Gleason 4+3, PSA 8.48, vol 33cc---  planned external beam radiation therpay  . Tobacco abuse   . Urgency of urination   . Wears glasses     Past Surgical History:  Procedure  Laterality Date  . COLONOSCOPY  last one 06/ 2018  . GOLD SEED IMPLANT N/A 12/23/2017   Procedure: GOLD SEED IMPLANT;  Surgeon: Alexis Frock, MD;  Location: [facility];  Service: Urology;  Laterality: N/A;  . LEFT HEART CATH AND CORONARY ANGIOGRAPHY N/A 05/12/2020   Procedure:  LEFT HEART CATH AND CORONARY ANGIOGRAPHY;  Surgeon: Burnell Blanks, MD;  Location: [place] [facility];  Service: Cardiovascular;  Laterality: N/A;  . PROSTATE BIOPSY  06-24-2016;  09-15-2017-- at dr Tresa Moore office  . SPACE OAR INSTILLATION N/A 12/23/2017   Procedure: SPACE OAR INSTILLATION;  Surgeon: Alexis Frock, MD;  Location: [facility];  Service: Urology;  Laterality: N/A;     reports that he quit smoking about 46 years ago. His smoking use included cigarettes. He has a 9.00 pack-year smoking history. He has never used smokeless tobacco. He reports that he does not drink alcohol and does not use drugs.  Allergies  Allergen Reactions  . Aspirin Nausea And Vomiting and Other (See Comments)    "sharp stomach pain and excessive saliva"  . Simvastatin Other (See Comments)    "back and mid section achy pain"    Family History  Problem Relation Age of Onset  . Prostate cancer Brother 96  . Prostate cancer Brother        metastatic/ late treatment dx in 60's/70's  . Breast cancer Sister 23  . Prostate cancer Brother        dx in 60's/70's  . Prostate cancer Brother        dx 60's/70's  . Prostate cancer Brother        dx 60's/70's  . Prostate cancer Brother        74's  . Lung cancer Brother   . Prostate cancer Other   . Prostate cancer Other   . Lung cancer Sister      Prior to Admission medications   Medication Sig Start Date End Date Taking? Authorizing Provider  acetaminophen (TYLENOL) 500 MG tablet Take 1,000 mg by mouth every 6 (six) hours as needed for headache (pain).    Yes [provider]  clopidogrel (PLAVIX) 75 MG tablet Take 75 mg by mouth daily.    Yes [provider]  fluticasone (FLONASE) 50 MCG/ACT nasal spray Place 2 sprays into both nostrils daily. 03/18/19  Yes Fawze, Mina A, PA-C  lisinopril-hydrochlorothiazide (PRINZIDE,ZESTORETIC) 20-12.5 MG tablet Take 0.5 tablets by mouth every morning.    Yes [provider]  loratadine (CLARITIN) 10 MG tablet Take 10 mg by mouth every morning.    Yes [provider]  OVER THE COUNTER MEDICATION Place 1 drop into both eyes daily as needed (dry eyes). Over the counter lubricating eye drop    Yes [provider]  pantoprazole (PROTONIX) 40 MG tablet Take 1 tablet (40 mg total) by mouth daily. 05/15/20  Yes Reino Bellis B, NP  pravastatin (PRAVACHOL) 40 MG tablet Take 40 mg by mouth every morning.    Yes [provider]  SYMBICORT 160-4.5 MCG/ACT inhaler Inhale 1-2 puffs into the lungs as needed. 05/31/20  Yes [provider]  traMADol (ULTRAM) 50 MG tablet Take 50-100 mg by mouth every 6 (six) hours as needed for moderate pain or severe pain.    Yes [provider]    Physical Exam: Vitals:   11/22/20 2100 11/22/20 2200 11/22/20 2310 11/23/20 0154  BP: (!) 155/79 (!) 147/75 (!) 143/65 (!) 155/81  Pulse: 92  64 66 (!) 55  Resp: (!) 25 (!) 22 19 18   Temp:    98.3 F (36.8 C)  TempSrc:    Oral  SpO2: 100% 99% 98% 100%  Weight:    67.7 kg  Height:        Constitutional: Awake alert and oriented x3, no associated distress.   Skin: no rashes, no lesions, good skin turgor noted. Eyes: Pupils are equally reactive to light.  No evidence of scleral icterus or conjunctival pallor.  ENMT: Somewhat dry mucous membranes noted.  Posterior pharynx clear of any exudate or lesions.   Neck: normal, supple, no masses, no thyromegaly.  No evidence of jugular venous distension.   Respiratory: clear to auscultation bilaterally, no wheezing, no crackles. Normal respiratory effort. No accessory muscle use.  Cardiovascular: Regular rate and rhythm, no murmurs / rubs / gallops. No extremity edema. 2+ pedal pulses. No carotid bruits.  Chest:   Nontender without crepitus or deformity.   Back:   Nontender without crepitus or deformity. Abdomen: Notable epigastric and right upper quadrant tenderness.  Abdomen is soft.  No  evidence of intra-abdominal masses.  Positive bowel sounds noted in all quadrants.   Musculoskeletal: No joint deformity upper and lower extremities. Good ROM, no contractures. Normal muscle tone.  Neurologic: CN 2-12 grossly intact. Sensation intact.  Patient moving all 4 extremities spontaneously.  Patient is following all commands.  Patient is responsive to verbal stimuli.   Psychiatric: Patient exhibits normal mood with appropriate affect.  Patient seems to possess insight as to their current situation.     Labs on Admission: I have personally reviewed following labs and imaging studies -   CBC: Recent Labs  Lab 11/22/20 1944  WBC 14.0*  HGB 12.2*  HCT 36.5*  MCV 90.8  PLT 595   Basic Metabolic Panel: Recent Labs  Lab 11/22/20 1944  NA 138  K 3.8  CL 101  CO2 28  GLUCOSE 133*  BUN 9  CREATININE 1.27*  CALCIUM 9.3   GFR: Estimated Creatinine Clearance: 48.9 mL/min (A) (by C-G formula based on SCr of 1.27 mg/dL (H)). Liver Function Tests: Recent Labs  Lab 11/22/20 1944  AST 26  ALT 18  ALKPHOS 45  BILITOT 0.6  PROT 6.8  ALBUMIN 3.8   Recent Labs  Lab 11/22/20 1944  LIPASE 21   No results for input(s): AMMONIA in the last 168 hours. Coagulation Profile: No results for input(s): INR, PROTIME in the last 168 hours. Cardiac Enzymes: No results for input(s): CKTOTAL, CKMB, CKMBINDEX, TROPONINI in the last 168 hours. BNP (last 3 results) No results for input(s): PROBNP in the last 8760 hours. HbA1C: No results for input(s): HGBA1C in the last 72 hours. CBG: No results for input(s): GLUCAP in the last 168 hours. Lipid Profile: No results for input(s): CHOL, HDL, LDLCALC, TRIG, CHOLHDL, LDLDIRECT in the last 72 hours. Thyroid Function Tests: No results for input(s): TSH, T4TOTAL, FREET4, T3FREE, THYROIDAB in the last 72 hours. Anemia Panel: No results for input(s): VITAMINB12, FOLATE, FERRITIN, TIBC, IRON, RETICCTPCT in the last 72 hours. Urine analysis:     Component Value Date/Time   COLORURINE YELLOW 11/22/2020 2005   APPEARANCEUR HAZY (A) 11/22/2020 2005   LABSPEC 1.023 11/22/2020 2005   PHURINE 6.0 11/22/2020 2005   GLUCOSEU NEGATIVE 11/22/2020 2005   HGBUR MODERATE (A) 11/22/2020 2005   BILIRUBINUR NEGATIVE 11/22/2020 2005   KETONESUR 20 (A) 11/22/2020 2005   PROTEINUR NEGATIVE 11/22/2020 2005   NITRITE NEGATIVE 11/22/2020 2005  LEUKOCYTESUR NEGATIVE 11/22/2020 2005    Radiological Exams on Admission - Personally Reviewed: CT Abdomen Pelvis W Contrast  Result Date: 11/22/2020 CLINICAL DATA:  Abdominal pain EXAM: CT ABDOMEN AND PELVIS WITH CONTRAST TECHNIQUE: Multidetector CT imaging of the abdomen and pelvis was performed using the standard protocol following bolus administration of intravenous contrast. CONTRAST:  44mL OMNIPAQUE IOHEXOL 300 MG/ML  SOLN COMPARISON:  07/31/2020 FINDINGS: Lower chest: Lung bases are clear. No effusions. Heart is normal size. Hepatobiliary: Low-density area posteriorly in the liver measures 15 mm. 18 mm lesion in the inferior right hepatic lobe. These areas are stable when compared to prior MRI. Gallbladder unremarkable. Pancreas: No focal abnormality or ductal dilatation. Spleen: No focal abnormality.  Normal size. Adrenals/Urinary Tract: No adrenal mass. No hydronephrosis or suspicious renal mass. Urinary bladder unremarkable. Stomach/Bowel: Moderate stool in the colon. Stomach, large and small bowel grossly unremarkable. Vascular/Lymphatic: Aortic atherosclerosis. No evidence of aneurysm or adenopathy. Reproductive: Prostate enlargement. Other: No evidence of aneurysm or adenopathy. Musculoskeletal: No acute bony abnormality. IMPRESSION: 15 mm and 18 mm lesions within the right hepatic lobe, stable since prior MRI. Moderate stool throughout the colon. Aortic atherosclerosis. Prostate enlargement. No acute findings in the abdomen or pelvis. Electronically Signed   By: Rolm Baptise M.D.   On: 11/22/2020 22:38    US Abdomen Limited RUQ (LIVER/GB)  Result Date: 11/22/2020 CLINICAL DATA:  Right upper quadrant pain EXAM: ULTRASOUND ABDOMEN LIMITED RIGHT UPPER QUADRANT COMPARISON:  None. FINDINGS: Gallbladder: Layering gallbladder sludge is seen. The gallbladder wall measures 2.4 mm. No sonographic Barocio sign noted by sonographer. Common bile duct: Diameter: 4 mm Liver: Again noted is a echogenic lesion within the anterior right liver lobe measuring 1.9 x 1.8 x 2.0 cm. Portal vein is patent on color Doppler imaging with normal direction of blood flow towards the liver. Other: None. IMPRESSION: Gallbladder sludge.  No evidence of acute cholecystitis Stable lesion within the right hepatic lobe measuring 1.8 x 2.0 cm Electronically Signed   By: Prudencio Pair M.D.   On: 11/22/2020 23:44    EKG: Personally reviewed.  Rhythm is normal sinus rhythm with heart rate of 60 bpm.  No dynamic ST segment changes appreciated.  Assessment/Plan Principal Problem:   Upper abdominal pain   Patient complaining of exacerbation of his chronic abdominal pain over the last several days.  At this point, patient is experiencing episodic pain is quite severe requiring opiate-based analgesics to achieve relief  Hepatic function panel unremarkable, lipase unremarkable, urinalysis unremarkable  CT imaging of the abdomen pelvis revealing no acute disease  Right upper quadrant ultrasound reveals gallbladder sludge  Pain is likely secondary to progression of patient's known history of metastatic neuroendocrine tumor  That being said, patient is also complaining of a 5-day history of constipation which may be exacerbating his chronic pain.  Will provide patient with an enema to help patient evacuate his bowels in an attempt to achieve relief.  In the meantime, we will additionally obtain a HIDA scan to ensure there is no evidence of hepatobiliary disease (albeit unlikely) considering quadrant tenderness  Hydrating gently with  intravenous fluids.  Active Problems:   Constipation   Patient complaining of significant constipation for at least the past 5 days  CT imaging of the abdomen notes moderate stool in the colon.  As mentioned above, will attempt administration of enema in an effort to alleviate patient's exacerbated abdominal pain.    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 Resume patients home regimen or oral antihypertensives . Titrate antihypertensive regimen as necessary to achieve adequate BP control . PRN intravenous antihypertensives for excessively elevated blood pressure    GERD without esophagitis  . Continuing home regimen of daily PPI therapy.    Mixed hyperlipidemia  . Continuing home regimen of lipid lowering therapy.  Code Status:  Full code Family Communication: deferred   Status is: Observation  The patient remains OBS appropriate and will d/c before 2 midnights.  Dispo: The patient is from: Home              Anticipated d/c is to: Home              Patient currently is not medically stable to d/c.   Difficult to place patient No   Vernelle Emerald MD [facility] Pager [phone]  If 7PM-7AM, please contact night-coverage [email or web address] Use universal [place] password for that web site. If you do not have the password, please call the hospital operator.  11/23/2020, 4:17 AM

## 2020-11-23 NOTE — ED Notes (Signed)
Attempted to call report to the floor. Secretary stated that the RN would call me back shortly for report.

## 2020-11-24 DIAGNOSIS — R1010 Upper abdominal pain, unspecified: Secondary | ICD-10-CM | POA: Diagnosis not present

## 2020-11-24 DIAGNOSIS — K5909 Other constipation: Secondary | ICD-10-CM | POA: Diagnosis not present

## 2020-11-24 DIAGNOSIS — R1030 Lower abdominal pain, unspecified: Secondary | ICD-10-CM

## 2020-11-24 DIAGNOSIS — R1084 Generalized abdominal pain: Secondary | ICD-10-CM | POA: Diagnosis not present

## 2020-11-24 LAB — CBC
HCT: 35.3 % — ABNORMAL LOW (ref 39.0–52.0)
Hemoglobin: 11.7 g/dL — ABNORMAL LOW (ref 13.0–17.0)
MCH: 30.3 pg (ref 26.0–34.0)
MCHC: 33.1 g/dL (ref 30.0–36.0)
MCV: 91.5 fL (ref 80.0–100.0)
Platelets: 193 10*3/uL (ref 150–400)
RBC: 3.86 MIL/uL — ABNORMAL LOW (ref 4.22–5.81)
RDW: 11.5 % (ref 11.5–15.5)
WBC: 10.2 10*3/uL (ref 4.0–10.5)
nRBC: 0 % (ref 0.0–0.2)

## 2020-11-24 LAB — BASIC METABOLIC PANEL
Anion gap: 6 (ref 5–15)
BUN: 8 mg/dL (ref 8–23)
CO2: 27 mmol/L (ref 22–32)
Calcium: 8.5 mg/dL — ABNORMAL LOW (ref 8.9–10.3)
Chloride: 102 mmol/L (ref 98–111)
Creatinine, Ser: 1.14 mg/dL (ref 0.61–1.24)
GFR, Estimated: >60 mL/min (ref >60)
Glucose, Bld: 104 mg/dL — ABNORMAL HIGH (ref 70–99)
Potassium: 3.8 mmol/L (ref 3.5–5.1)
Sodium: 135 mmol/L (ref 135–145)

## 2020-11-24 MED ORDER — PEG 3350-KCL-NA BICARB-NACL 420 G PO SOLR
2000.0000 mL | Freq: Once | ORAL | Status: AC
  Administered 2020-11-24: 2000 mL via ORAL
  Filled 2020-11-24: qty 4000

## 2020-11-24 MED ORDER — POLYETHYLENE GLYCOL 3350 17 G PO PACK
17.0000 g | PACK | Freq: Two times a day (BID) | ORAL | Status: DC
  Administered 2020-11-25 – 2020-11-26 (×2): 17 g via ORAL
  Filled 2020-11-24 (×3): qty 1

## 2020-11-24 MED ORDER — SACCHAROMYCES BOULARDII 250 MG PO CAPS
250.0000 mg | ORAL_CAPSULE | Freq: Two times a day (BID) | ORAL | Status: DC
  Administered 2020-11-24 – 2020-11-26 (×4): 250 mg via ORAL
  Filled 2020-11-24 (×4): qty 1

## 2020-11-24 NOTE — Consult Note (Signed)
Referring Provider: [facility] Primary Care Physician:  Alroy Dust, L.Marlou Sa, MD Primary Gastroenterologist:  Dr. Ronald Lobo  Reason for Consultation: Abdominal pain  HPI: Ryan Wilkerson is a 75 y.o. male with metastatic neuroendocrine tumor, also history of stroke, prostate cancer, reflux, and hyperlipidemia who was admitted to the hospital through the emergency room 2 days ago because of severe, recurring abdominal pain.  The patient is known to me from previous screening colonoscopy, last done 4 years ago, at which time a solitary diminutive adenoma was found, for which surveillance would be due approximately 3 years from now by updated guidelines.  It appears that the patient's abdominal pain is tied into constipation.  He gives a 20-year history of constipation, although he is not on any frankly constipating medications other than prn tramadol.  However, over the past 18 months, he indicates that the constipation has gotten worse, with a bowel movement roughly every 3 to 4 days despite use of various laxatives such as MiraLAX.  During the time that the constipation has gotten worse, he has noticed an increasing tendency for abdominal pain.  His primary physician had him start MiraLAX on a regular, consistent, daily basis about 2 weeks ago, but that did not seem to solve the patient's problem and if anything was associated with worsened symptoms prompting this hospitalization.  For the most part, the patient's abdominal pain is sporadic and transient, occurring perhaps every couple of days or every couple of weeks and lasting just a minute or 2.  Typically, it is preceded by a "groaning" or "growling" sound in the upper abdominal region, and then he feels a "knot" in that area, followed by sharp pricking sensations diffusely in the abdomen, some low-grade nausea, and some low abdominal discomfort.  There does seem to be some degree of relief with defecation, but these episodes do not  characteristically culminate in a bowel movement.  In addition to these fairly frequent episodes, however, he has had 3 episodes over the past 18 months where he has had several continuous days of persistent, extreme abdominal pain with variable intensity.  This most recent episode was bad enough to make him crawl on the floor, screaming in pain, and culminated in his coming to the emergency room several days ago and getting admitted.  Since then, he has had some spasm-like pain, roughly 5-8 on a scale of 10 in terms of severity.  He has not had a spontaneous bowel movement since admission but did have liquid stool after an enema which was administered several nights ago.  Evaluation in the hospital so far has been pertinent for the absence of fever, minimal elevation of white count on admission which has subsequently normalized, normal liver chemistries, a CT of the abdomen without acute findings but showing a significant stool burden and his known (but stable) hepatic lesions from the neuroendocrine tumor, and ultrasound which showed sludge, and a standard hepatobiliary scan that did not show evidence of acute cholecystitis (CCK stimulation with gallbladder ejection fraction not performed).   Past Medical History:  Diagnosis Date  . Acute idiopathic pericarditis   . Anemia   . CVA (cerebral vascular accident) ([place])   . Erectile dysfunction   . Essential tremor   . Family history of breast cancer   . Family history of prostate cancer   . Family history of stomach cancer   . GERD (gastroesophageal reflux disease)   . History of colon polyps   . Hypercholesterolemia   . Hypertension   .  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 Nocturia   . Pericardial effusion   . Pericarditis   . Prostate cancer [facility]) urologist-- dr Tresa Moore /  oncologist-- dr manning   prostate bx 06-24-2016 and 09-15-2017 at dr Tresa Moore office  Stage T1c, Gleason 4+3, PSA 8.48, vol 33cc---  planned external  beam radiation therpay  . Tobacco abuse   . Urgency of urination   . Wears glasses     Past Surgical History:  Procedure Laterality Date  . COLONOSCOPY  last one 06/ 2018  . GOLD SEED IMPLANT N/A 12/23/2017   Procedure: GOLD SEED IMPLANT;  Surgeon: Alexis Frock, MD;  Location: [facility];  Service: Urology;  Laterality: N/A;  . LEFT HEART CATH AND CORONARY ANGIOGRAPHY N/A 05/12/2020   Procedure: LEFT HEART CATH AND CORONARY ANGIOGRAPHY;  Surgeon: Burnell Blanks, MD;  Location: [facility];  Service: Cardiovascular;  Laterality: N/A;  . PROSTATE BIOPSY  06-24-2016;  09-15-2017-- at dr Tresa Moore office  . SPACE OAR INSTILLATION N/A 12/23/2017   Procedure: SPACE OAR INSTILLATION;  Surgeon: Alexis Frock, MD;  Location: [facility];  Service: Urology;  Laterality: N/A;    Prior to Admission medications   Medication Sig Start Date End Date Taking? Authorizing Provider  acetaminophen (TYLENOL) 500 MG tablet Take 1,000 mg by mouth every 6 (six) hours as needed for headache (pain).    Yes [provider]  clopidogrel (PLAVIX) 75 MG tablet Take 75 mg by mouth daily.    Yes [provider]  fluticasone (FLONASE) 50 MCG/ACT nasal spray Place 2 sprays into both nostrils daily. 03/18/19  Yes Fawze, Mina A, PA-C  lisinopril-hydrochlorothiazide (PRINZIDE,ZESTORETIC) 20-12.5 MG tablet Take 0.5 tablets by mouth every morning.    Yes [provider]  loratadine (CLARITIN) 10 MG tablet Take 10 mg by mouth every morning.    Yes [provider]  OVER THE COUNTER MEDICATION Place 1 drop into both eyes daily as needed (dry eyes). Over the counter lubricating eye drop    Yes [provider]  pantoprazole (PROTONIX) 40 MG tablet Take 1 tablet (40 mg total) by mouth daily. 05/15/20  Yes Reino Bellis B, NP  pravastatin (PRAVACHOL) 40 MG tablet Take 40 mg by mouth every morning.    Yes [provider]  SYMBICORT  160-4.5 MCG/ACT inhaler Inhale 1-2 puffs into the lungs as needed. 05/31/20  Yes [provider]  traMADol (ULTRAM) 50 MG tablet Take 50-100 mg by mouth every 6 (six) hours as needed for moderate pain or severe pain.    Yes [provider]    Current Facility-Administered Medications  Medication Dose Route Frequency Provider Last Rate Last Admin  . acetaminophen (TYLENOL) tablet 650 mg  650 mg Oral Q6H PRN Shalhoub, Sherryll Burger, MD       Or  . acetaminophen (TYLENOL) suppository 650 mg  650 mg Rectal Q6H PRN Shalhoub, Sherryll Burger, MD      . clopidogrel (PLAVIX) tablet 75 mg  75 mg Oral Daily Shalhoub, Sherryll Burger, MD   75 mg at 11/24/20 1016  . dicyclomine (BENTYL) capsule 10 mg  10 mg Oral Q8H PRN Truitt Merle, MD   10 mg at 11/23/20 2125  . enoxaparin (LOVENOX) injection 40 mg  40 mg Subcutaneous Daily Shalhoub, Sherryll Burger, MD   40 mg at 11/24/20 1017  . fluticasone (FLONASE) 50 MCG/ACT nasal spray 2 spray  2 spray Each Nare Daily Shalhoub, Sherryll Burger, MD  2 spray at 11/24/20 1020  . lisinopril (ZESTRIL) tablet 10 mg  10 mg Oral Daily Shalhoub, Sherryll Burger, MD   10 mg at 11/24/20 1016   And  . hydrochlorothiazide 10 mg/mL oral suspension 6.25 mg  6.25 mg Oral Daily Shalhoub, Sherryll Burger, MD   6.25 mg at 11/24/20 1149  . hydrocortisone (ANUSOL-HC) suppository 25 mg  25 mg Rectal BID Dessa Phi, DO   25 mg at 11/24/20 1019  . HYDROmorphone (DILAUDID) injection 1 mg  1 mg Intravenous Q3H PRN Dessa Phi, DO   1 mg at 11/23/20 2128  . loratadine (CLARITIN) tablet 10 mg  10 mg Oral q morning Shalhoub, Sherryll Burger, MD   10 mg at 11/24/20 1148  . ondansetron (ZOFRAN) tablet 4 mg  4 mg Oral Q6H PRN Shalhoub, Sherryll Burger, MD       Or  . ondansetron [facility]) injection 4 mg  4 mg Intravenous Q6H PRN Shalhoub, Sherryll Burger, MD      . oxyCODONE-acetaminophen (PERCOCET/ROXICET) 5-325 MG per tablet 1 tablet  1 tablet Oral Q4H PRN Shalhoub, Sherryll Burger, MD   1 tablet at 11/24/20 1155  . pantoprazole (PROTONIX) EC  tablet 40 mg  40 mg Oral Daily Shalhoub, Sherryll Burger, MD   40 mg at 11/24/20 1016  . polyethylene glycol (MIRALAX / GLYCOLAX) packet 17 g  17 g Oral BID [name] R, NP   17 g at 11/24/20 1016  . pravastatin (PRAVACHOL) tablet 40 mg  40 mg Oral q morning Shalhoub, Sherryll Burger, MD   40 mg at 11/24/20 1148  . senna-docusate (Senokot-S) tablet 1 tablet  1 tablet Oral QHS Dessa Phi, DO   1 tablet at 11/23/20 2125  . simethicone (MYLICON) chewable tablet 80 mg  80 mg Oral QID [name] R, NP   80 mg at 11/24/20 1016  . traMADol (ULTRAM) tablet 50-100 mg  50-100 mg Oral Q6H PRN Shalhoub, Sherryll Burger, MD        Allergies as of 11/22/2020 - Review Complete 11/22/2020  Allergen Reaction Noted  . Aspirin Nausea And Vomiting and Other (See Comments) 08/27/2017  . Simvastatin Other (See Comments) 08/27/2017    Family History  Problem Relation Age of Onset  . Prostate cancer Brother 70  . Prostate cancer Brother        metastatic/ late treatment dx in 60's/70's  . Breast cancer Sister 29  . Prostate cancer Brother        dx in 60's/70's  . Prostate cancer Brother        dx 60's/70's  . Prostate cancer Brother        dx 60's/70's  . Prostate cancer Brother        71's  . Lung cancer Brother   . Prostate cancer Other   . Prostate cancer Other   . Lung cancer Sister     Social History   Socioeconomic History  . Marital status: Married    Spouse name: Not on file  . Number of children: 4  . Years of education: Not on file  . Highest education level: Not on file  Occupational History  . Occupation: Retired    Comment: Automotive engineer  Tobacco Use  . Smoking status: Former Smoker    Packs/day: 0.50    Years: 18.00    Pack years: 9.00    Types: Cigarettes    Quit date: 05/02/1974    Years since quitting: 46.5  . Smokeless tobacco: Never Used  Vaping Use  . Vaping Use: Never used  Substance and Sexual Activity  . Alcohol use: No  . Drug use: No  . Sexual activity: Yes   Other Topics Concern  . Not on file  Social History Narrative   Married with 3 daughters, 1 son. 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   Social Determinants of Health   Financial Resource Strain: Not on file  Food Insecurity: Not on file  Transportation Needs: Not on file  Physical Activity: Not on file  Stress: Not on file  Social Connections: Not on file  Intimate Partner Violence: Not on file    Review of Systems: No upper tract symptoms such as nausea or vomiting, no other active symptoms such as dysuria, chest pain, shortness of breath, cough, skin rashes, lymphadenopathy.  Physical Exam: Vital signs in last 24 hours: Temp:  [98.4 F (36.9 C)-98.6 F (37 C)] 98.6 F (37 C) (03/26 0517) Pulse Rate:  [52-74] 52 (03/26 0517) Resp:  [17-20] 18 (03/26 0517) BP: (122-156)/(59-79) 122/59 (03/26 0517) SpO2:  [98 %-100 %] 98 % (03/26 0517) Last BM Date: 11/24/20 General:   Alert,  Well-developed, well-nourished, pleasant and cooperative sitting in bed in NAD Head:  Normocephalic and atraumatic. Eyes:  Sclera clear, no icterus.   Conjunctiva pink. Mouth:   No ulcerations or lesions.  Oropharynx pink & moist. Neck:   No masses or thyromegaly. Lungs:  Clear throughout to auscultation.   No wheezes, crackles, or rhonchi. No evident respiratory distress. Heart:   Regular rate and rhythm; no murmurs, clicks, rubs,  or gallops. Abdomen:  Soft, nontender, and nondistended.  No guarding, no rebound.  Mild to moderate scattered tympany is present.  No masses, hepatosplenomegaly or ventral hernias noted.  Bowel sounds are somewhat loud in character, but nonobstructive. Rectal:    No evident perianal disease, normal sphincter tone without anal stenosis, prostate perhaps slightly enlarged but smooth, without nodules; rectal ampulla is empty, with just some mucoid residue. Msk:   Symmetrical without  gross deformities. Pulses:  Normal radial pulse is noted. Extremities:  Without clubbing, cyanosis, or edema. Neurologic:  Alert and coherent;  grossly normal neurologically.  No obvious sequelae from previous CVA although formal neurologic testing not performed. Skin:  Intact without significant lesions or rashes. Cervical Nodes:  No significant cervical adenopathy. Psych:   Alert and cooperative. Normal mood and affect.  Intake/Output from previous day: 03/25 0701 - 03/26 0700 In: 100 [P.O.:100] Out: -  Intake/Output this shift: No intake/output data recorded.  Lab Results: Recent Labs    11/22/20 1944 11/23/20 0417 11/24/20 0327  WBC 14.0* 7.3 10.2  HGB 12.2* 11.2* 11.7*  HCT 36.5* 32.7* 35.3*  PLT 223 195 193   BMET Recent Labs    11/22/20 1944 11/23/20 0417 11/24/20 0327  NA 138 138 135  K 3.8 3.4* 3.8  CL 101 103 102  CO2 28 26 27   GLUCOSE 133* 109* 104*  BUN 9 8 8   CREATININE 1.27* 1.19 1.14  CALCIUM 9.3 8.6* 8.5*   LFT Recent Labs    11/23/20 0417  PROT 5.7*  ALBUMIN 3.1*  AST 21  ALT 17  ALKPHOS 35*  BILITOT 1.0   PT/INR No results for input(s): LABPROT, INR in the last 72 hours.  Studies/Results: NM Hepatobiliary Liver Func  Result Date: 11/23/2020 CLINICAL DATA:  Right upper quadrant pain. EXAM: NUCLEAR MEDICINE HEPATOBILIARY IMAGING TECHNIQUE:  Sequential images of the abdomen were obtained out to 60 minutes following intravenous administration of radiopharmaceutical. RADIOPHARMACEUTICALS:  5.5 mCi Tc-68m  Choletec IV COMPARISON:  Ultrasound on 11/22/2020 FINDINGS: Prompt uptake and biliary excretion of activity by the liver is seen. Gallbladder activity is visualized, consistent with patency of cystic duct. Biliary activity passes into small bowel, consistent with patent common bile duct. IMPRESSION: Normal hepatobiliary scan, demonstrating patency of cystic and common bile ducts. Electronically Signed   By: Marlaine Hind M.D.   On: 11/23/2020  12:11   CT Abdomen Pelvis W Contrast  Result Date: 11/22/2020 CLINICAL DATA:  Abdominal pain EXAM: CT ABDOMEN AND PELVIS WITH CONTRAST TECHNIQUE: Multidetector CT imaging of the abdomen and pelvis was performed using the standard protocol following bolus administration of intravenous contrast. CONTRAST:  42mL OMNIPAQUE IOHEXOL 300 MG/ML  SOLN COMPARISON:  07/31/2020 FINDINGS: Lower chest: Lung bases are clear. No effusions. Heart is normal size. Hepatobiliary: Low-density area posteriorly in the liver measures 15 mm. 18 mm lesion in the inferior right hepatic lobe. These areas are stable when compared to prior MRI. Gallbladder unremarkable. Pancreas: No focal abnormality or ductal dilatation. Spleen: No focal abnormality.  Normal size. Adrenals/Urinary Tract: No adrenal mass. No hydronephrosis or suspicious renal mass. Urinary bladder unremarkable. Stomach/Bowel: Moderate stool in the colon. Stomach, large and small bowel grossly unremarkable. Vascular/Lymphatic: Aortic atherosclerosis. No evidence of aneurysm or adenopathy. Reproductive: Prostate enlargement. Other: No evidence of aneurysm or adenopathy. Musculoskeletal: No acute bony abnormality. IMPRESSION: 15 mm and 18 mm lesions within the right hepatic lobe, stable since prior MRI. Moderate stool throughout the colon. Aortic atherosclerosis. Prostate enlargement. No acute findings in the abdomen or pelvis. Electronically Signed   By: Rolm Baptise M.D.   On: 11/22/2020 22:38   US Abdomen Limited RUQ (LIVER/GB)  Result Date: 11/22/2020 CLINICAL DATA:  Right upper quadrant pain EXAM: ULTRASOUND ABDOMEN LIMITED RIGHT UPPER QUADRANT COMPARISON:  None. FINDINGS: Gallbladder: Layering gallbladder sludge is seen. The gallbladder wall measures 2.4 mm. No sonographic Eder sign noted by sonographer. Common bile duct: Diameter: 4 mm Liver: Again noted is a echogenic lesion within the anterior right liver lobe measuring 1.9 x 1.8 x 2.0 cm. Portal vein is patent  on color Doppler imaging with normal direction of blood flow towards the liver. Other: None. IMPRESSION: Gallbladder sludge.  No evidence of acute cholecystitis Stable lesion within the right hepatic lobe measuring 1.8 x 2.0 cm Electronically Signed   By: Prudencio Pair M.D.   On: 11/22/2020 23:44    Impression: 1.  Recurrent abdominal pain of unclear cause, although intestinal spasms sound like the most likely culprit.  Despite his history of metastatic neuroendocrine tumor, he does not appear to have a intra-abdominal carcinomatosis. 2.  Chronic constipation.  Moderate stool retained in the colon noted on recent CT.  However, no fecal impaction by digital exam. 3.  Neuroendocrine tumor metastatic to the liver, stable. 4.  History of solitary diminutive adenoma, due for surveillance colonoscopy in 3 to 5 years by updated guidelines.  Plan: Trial of a "purge" to see if that brings about resolution of symptoms.  Trial of probiotics.   LOS: 0 days   Youlanda Mighty Shriley Joffe  11/24/2020, 1:50 PM   Pager [phone] If no answer or after 5 PM call [phone]

## 2020-11-24 NOTE — Progress Notes (Addendum)
PROGRESS NOTE    Ryan Wilkerson  VEL:[id number] DOB: 23-May-1946 DOA: 11/22/2020 PCP: Alroy Dust, L.Marlou Sa, MD     Brief Narrative:  Ryan Wilkerson is a 75 yo male with past medical history significant of metastatic neuroendocrine tumor, hypertension, hyperlipidemia, history of a stroke, prostate cancer, GERD who presented to the hospital with complaints of abdominal pain.  For several years, patient has been experiencing intermittent bouts of abdominal pain, but worsening recently.  He admits to right upper quadrant abdominal pain that feels like is in ice pick stabbing him, also admitting to some lower abdominal cramping.  In the emergency department, work-up was concerning for gallbladder sludge, constipation. HIDA scan negative.   New events last 24 hours / Subjective: Patient sitting in chair, eating breakfast. He states that he is still having severe intermittent lower abdominal cramping. They are coming suddenly, about 2-3 times hourly, about 12-16 hours in the day. When the cramping starts, they are severe, debilitating, but resolves in 1-2 minutes. He had some BM yesterday. He also states that he was having trouble passing gas, has been straining to pass.    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 Moderate stool throughout the colon. -RUQ [place]: Gallbladder sludge. No evidence of acute cholecystitis -HIDA scan: Normal hepatobiliary scan, demonstrating patency of cystic and common bile ducts -LFT, lipase normal -Appreciate oncology, thought  pain is likely related to constipation or side effect of lanreotide, recommended GI consult if pain not improved -GI consult today. Patient last had a colonoscopy in 2018 by Dr. Cristina Gong. I'm unable to review records but per patient, he found 1 polyp which was removed. Was supposed to be referred for another colonoscopy as outpatient but has not been set up yet  -Continue bentyl   Constipation -Resolved after enema. Continue bowel regimen, miralax, senokot   Metastatic malignant neuroendocrine tumor to the liver -Followed by Dr. Burr Medico  Hypertension -Continue lisinopril/HCTZ   GERD -Continue PPI   HLD -Continue pravachol   Hx CVA -Continue plavix      DVT prophylaxis:  enoxaparin (LOVENOX) injection 40 mg Start: 11/23/20 1000  Code Status: Full Family Communication: None at bedside  Disposition Plan:  Status is: Observation  The patient will require care spanning > 2 midnights and should be moved to inpatient because: Ongoing active pain requiring inpatient pain management and Ongoing diagnostic testing needed not appropriate for outpatient work up  Dispo: The patient is from: Home              Anticipated d/c is to: Home              Patient currently is not medically stable to d/c. Continues to have abdominal cramping, not ready for discharge. GI consulted today.    Difficult to place patient No      Consultants:   Oncology  GI  Procedures:   None   Antimicrobials:  Anti-infectives (From admission, onward)   None        Objective: Vitals:   11/23/20 0154 11/23/20 1540 11/23/20 2117  11/24/20 0517  BP: (!) 155/81 (!) 156/78 123/79 (!) 122/59  Pulse: (!) 55 74 (!) 58 (!) 52  Resp: 18 17 20 18   Temp: 98.3 F (36.8 C) 98.4 F (36.9 C) 98.5 F (36.9 C) 98.6 F (37 C)  TempSrc: Oral Oral Oral Oral  SpO2: 100% 100% 100% 98%  Weight: 67.7 kg     Height:        Intake/Output Summary (Last 24 hours) at 11/24/2020 0958 Last data filed at  11/24/2020 0517 Gross per 24 hour  Intake 100 ml  Output --  Net 100 ml   Filed Weights   11/22/20 1916 11/23/20 0154  Weight: 70.3 kg 67.7 kg    Examination:  General exam: Appears calm and comfortable  Respiratory system: Clear to auscultation. Respiratory effort normal. No respiratory distress. No conversational dyspnea.  Cardiovascular system: S1 & S2 heard, RRR. No murmurs. No pedal edema. Gastrointestinal system: Abdomen is nondistended, soft and TTP below navel. Normal bowel sounds heard. Central nervous system: Alert and oriented. No focal neurological deficits. Speech clear.  Extremities: Symmetric in appearance  Skin: No rashes, lesions or ulcers on exposed skin  Psychiatry: Judgement and insight appear normal. Mood & affect appropriate.   Data Reviewed: I have personally reviewed following labs and imaging studies  CBC: Recent Labs  Lab 11/22/20 1944 11/23/20 0417 11/24/20 0327  WBC 14.0* 7.3 10.2  NEUTROABS  --  5.0  --   HGB 12.2* 11.2* 11.7*  HCT 36.5* 32.7* 35.3*  MCV 90.8 89.8 91.5  PLT 223 195 947   Basic Metabolic Panel: Recent Labs  Lab 11/22/20 1944 11/23/20 0417 11/24/20 0327  NA 138 138 135  K 3.8 3.4* 3.8  CL 101 103 102  CO2 28 26 27   GLUCOSE 133* 109* 104*  BUN 9 8 8   CREATININE 1.27* 1.19 1.14  CALCIUM 9.3 8.6* 8.5*   GFR: Estimated Creatinine Clearance: 54.4 mL/min (by C-G formula based on SCr of 1.14 mg/dL). Liver Function Tests: Recent Labs  Lab 11/22/20 1944 11/23/20 0417  AST 26 21  ALT 18 17  ALKPHOS 45 35*  BILITOT 0.6 1.0  PROT 6.8 5.7*  ALBUMIN 3.8 3.1*   Recent Labs  Lab 11/22/20 1944  LIPASE 21   No results for input(s): AMMONIA in the last 168 hours. Coagulation Profile: No results for input(s): INR, PROTIME in the last 168 hours. Cardiac Enzymes: No results for input(s): CKTOTAL, CKMB, CKMBINDEX, TROPONINI in the last 168 hours. BNP (last 3 results) No results for input(s): PROBNP in the last 8760  hours. HbA1C: No results for input(s): HGBA1C in the last 72 hours. CBG: No results for input(s): GLUCAP in the last 168 hours. Lipid Profile: No results for input(s): CHOL, HDL, LDLCALC, TRIG, CHOLHDL, LDLDIRECT in the last 72 hours. Thyroid Function Tests: No results for input(s): TSH, T4TOTAL, FREET4, T3FREE, THYROIDAB in the last 72 hours. Anemia Panel: No results for input(s): VITAMINB12, FOLATE, FERRITIN, TIBC, IRON, RETICCTPCT in the last 72 hours. Sepsis Labs: No results for input(s): PROCALCITON, LATICACIDVEN in the last 168 hours.  Recent Results (from the past 240 hour(s))  SARS CORONAVIRUS 2 (TAT 6-24 HRS) Nasopharyngeal Nasopharyngeal Swab     Status: None   Collection Time: 11/23/20 12:22 AM   Specimen: Nasopharyngeal Swab  Result Value Ref Range Status   SARS Coronavirus 2 NEGATIVE NEGATIVE Final    Comment: (NOTE) SARS-CoV-2 target nucleic acids are NOT DETECTED.  The SARS-CoV-2 RNA is generally detectable in upper and lower  respiratory specimens during the acute phase of infection. Negative results do not preclude SARS-CoV-2 infection, do not rule out co-infections with other pathogens, and should not be used as the sole basis for treatment or other patient management decisions. Negative results must be combined with clinical observations, patient history, and epidemiological information. The expected result is Negative.  Fact Sheet for Patients: SugarRoll.be  Fact Sheet for Healthcare Providers: https://www.woods-mathews.com/  This test is not yet approved or cleared by the [place] FDA and  has been authorized for detection and/or diagnosis of SARS-CoV-2 by FDA under an Emergency Use Authorization (EUA). This EUA will remain  in effect (meaning this test can be used) for the duration of the COVID-19 declaration under Se ction 564(b)(1) of the Act, 21 U.S.C. section 360bbb-3(b)(1), unless the authorization is  terminated or revoked sooner.  Performed at [facility], [place] [address] [place], [place] [address]       Radiology Studies: NM Hepatobiliary Liver Func  Result Date: 11/23/2020 CLINICAL DATA:  Right upper quadrant pain. EXAM: NUCLEAR MEDICINE HEPATOBILIARY IMAGING TECHNIQUE: Sequential images of the abdomen were obtained out to 60 minutes following intravenous administration of radiopharmaceutical. RADIOPHARMACEUTICALS:  5.5 mCi Tc-58m  Choletec IV COMPARISON:  Ultrasound on 11/22/2020 FINDINGS: Prompt uptake and biliary excretion of activity by the liver is seen. Gallbladder activity is visualized, consistent with patency of cystic duct. Biliary activity passes into small bowel, consistent with patent common bile duct. IMPRESSION: Normal hepatobiliary scan, demonstrating patency of cystic and common bile ducts. Electronically Signed   By: Marlaine Hind M.D.   On: 11/23/2020 12:11   CT Abdomen Pelvis W Contrast  Result Date: 11/22/2020 CLINICAL DATA:  Abdominal pain EXAM: CT ABDOMEN AND PELVIS WITH CONTRAST TECHNIQUE: Multidetector CT imaging of the abdomen and pelvis was performed using the standard protocol following bolus administration of intravenous contrast. CONTRAST:  43mL OMNIPAQUE IOHEXOL 300 MG/ML  SOLN COMPARISON:  07/31/2020 FINDINGS: Lower chest: Lung bases are clear. No effusions. Heart is normal size. Hepatobiliary: Low-density area posteriorly in the liver measures 15 mm. 18 mm lesion in the inferior right hepatic lobe. These areas are stable when compared to prior MRI. Gallbladder unremarkable. Pancreas: No focal abnormality or ductal dilatation. Spleen: No focal abnormality.  Normal size. Adrenals/Urinary Tract: No adrenal mass. No hydronephrosis or suspicious renal mass. Urinary bladder unremarkable. Stomach/Bowel: Moderate stool in the colon. Stomach, large and small bowel grossly unremarkable. Vascular/Lymphatic: Aortic atherosclerosis. No evidence of aneurysm or  adenopathy. Reproductive: Prostate enlargement. Other: No evidence of aneurysm or adenopathy. Musculoskeletal: No acute bony abnormality. IMPRESSION: 15 mm and 18 mm lesions within the right hepatic lobe, stable since prior MRI. Moderate stool throughout the colon. Aortic atherosclerosis. Prostate enlargement. No acute findings in the abdomen or pelvis. Electronically Signed   By: Rolm Baptise M.D.   On: 11/22/2020 22:38   US Abdomen Limited RUQ (LIVER/GB)  Result Date: 11/22/2020 CLINICAL DATA:  Right upper quadrant pain EXAM: ULTRASOUND ABDOMEN LIMITED RIGHT UPPER QUADRANT COMPARISON:  None. FINDINGS: Gallbladder: Layering gallbladder sludge is seen. The gallbladder wall measures 2.4 mm. No sonographic Uddin sign noted by sonographer. Common bile duct: Diameter: 4 mm Liver: Again noted is a echogenic lesion within the anterior right liver lobe measuring 1.9 x 1.8 x 2.0 cm. Portal vein is patent on color Doppler imaging with normal direction of blood flow towards the liver. Other: None. IMPRESSION: Gallbladder sludge.  No evidence of acute cholecystitis Stable lesion within the right hepatic lobe measuring 1.8 x  2.0 cm Electronically Signed   By: Prudencio Pair M.D.   On: 11/22/2020 23:44      Scheduled Meds: . clopidogrel  75 mg Oral Daily  . enoxaparin (LOVENOX) injection  40 mg Subcutaneous Daily  . fluticasone  2 spray Each Nare Daily  . lisinopril  10 mg Oral Daily   And  . hydrochlorothiazide  6.25 mg Oral Daily  . hydrocortisone  25 mg Rectal BID  . loratadine  10 mg Oral q morning  . pantoprazole  40 mg Oral Daily  . polyethylene glycol  17 g Oral BID  . pravastatin  40 mg Oral q morning  . senna-docusate  1 tablet Oral QHS  . simethicone  80 mg Oral QID   Continuous Infusions:   LOS: 0 days      Time spent: 25 minutes   Dessa Phi, DO [facility] 11/24/2020, 9:58 AM   Available via Epic secure chat 7am-7pm After these hours, please refer to coverage provider  listed on [email or web address]

## 2020-11-25 DIAGNOSIS — R1084 Generalized abdominal pain: Secondary | ICD-10-CM | POA: Diagnosis not present

## 2020-11-25 DIAGNOSIS — R1010 Upper abdominal pain, unspecified: Secondary | ICD-10-CM | POA: Diagnosis not present

## 2020-11-25 DIAGNOSIS — R1030 Lower abdominal pain, unspecified: Secondary | ICD-10-CM

## 2020-11-25 DIAGNOSIS — K5909 Other constipation: Secondary | ICD-10-CM | POA: Diagnosis not present

## 2020-11-25 NOTE — Progress Notes (Signed)
Patient had first bowel movement which consumption was approximately 1/2 gallon of Nulytely yesterday.  The first had some solid material, the subsequent ones were all liquid. He also reports that he passed a large amount of flatus.  He has been free of abdominal spasms since 2 pm yesterday, which was slightly before the "purge."  He did not eat last night or so far this morning.  IMPR:  It appears that emptying his colon MAY be the key in preventing abdominal spasms.  RECOMM (d/w Dr. Maylene Roes):    1. Resume Miralax bid 2. Observe in-house for another day, to assess whether he remains free of sx after consuming several meals. 3. I have instructed the pt to contact my office next week to arrange a f/u office visit with me for approximately 3 wks from now. 4. The patient understands that the goal is to achieve ongoing defecation, 1-2 BM's per day, which will probably be somewhat loose or soft in character.  This should prevent fecal obstipation, which will hopefully prevent abdominal spasms. The trick will be to accomplish this without causing "overshoot" diarrhea or fecal seepage.  We may find that we need to adjust the dose of Miralax or switch to an alternative agent to achieve this objective. 5. I did advise the patient to start Align probiotic one capsule daily following discharge (is on Florastor here in the hospital, which can be stopped at time of discharge). 6. It remains unclear whether the patient's pain was due to fecal obstipation, but this should become clear once he has been on an effective bowel regimen for a period of time.  Cleotis Nipper, M.D. Pager [phone] If no answer or after 5 PM call [phone]

## 2020-11-25 NOTE — Progress Notes (Signed)
PROGRESS NOTE    Ryan Wilkerson  YCX:[id number] DOB: [birth year] DOA: 11/22/2020 PCP: Alroy Dust, L.Marlou Sa, MD     Brief Narrative:  Ryan Wilkerson is a 75 yo male with past medical history significant of metastatic neuroendocrine tumor, hypertension, hyperlipidemia, history of a stroke, prostate cancer, GERD who presented to the hospital with complaints of abdominal pain.  For several years, patient has been experiencing intermittent bouts of abdominal pain, but worsening recently.  He admits to right upper quadrant abdominal pain that feels like is in ice pick stabbing him, also admitting to some lower abdominal cramping.  In the emergency department, work-up was concerning for gallbladder sludge, constipation. HIDA scan negative.  GI was consulted due to continued abdominal cramping.  New events last 24 hours / Subjective: States that he has been working on taking his Nulytely.  Has not had any abdominal cramping since 2 PM yesterday.  Very appreciative of Dr. Buccini's help.  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 Moderate stool throughout the colon. -RUQ [place]: Gallbladder sludge. No evidence of acute cholecystitis -HIDA scan: Normal hepatobiliary scan, demonstrating patency of cystic and common bile ducts -LFT, lipase normal -Appreciate oncology, thought pain is likely related to constipation or side effect of lanreotide, recommended GI consult if pain not improved -Appreciate GI -Continue bentyl  -Trial Nulytely  Constipation -Continue  bowel regimen  Metastatic malignant neuroendocrine tumor to the liver -Followed by Dr. Burr Medico  Hypertension -Continue lisinopril/HCTZ   GERD -Continue PPI   HLD -Continue pravachol   Hx CVA -Continue plavix      DVT prophylaxis:  enoxaparin (LOVENOX) injection 40 mg Start: 11/23/20 1000  Code Status: Full Family Communication: None at bedside  Disposition Plan:  Status is: Observation  Confirmed with [facility] advisor to leave observation status in place.   Dispo: The patient is from: Home              Anticipated d/c is to: Home              Patient currently is not medically stable to d/c. Continue GI treatment. Hopeful dc home 3/28 if clinically improved and stable from GI stand point.    Difficult to place patient No     Consultants:   Oncology  GI  Procedures:   None   Antimicrobials:  Anti-infectives (From admission, onward)   None       Objective: Vitals:   11/24/20 0517 11/24/20 1507 11/25/20 0439 11/25/20 0952  BP: (!) 122/59 (!) 132/44 (!) 134/54 (!) 145/68  Pulse: (!) 52 61 (!) 54 (!) 51  Resp: 18 18 16 17   Temp: 98.6 F (37 C) 99.1 F (37.3 C) 98.4 F (36.9 C) 97.8 F (36.6 C)  TempSrc: Oral Oral Oral Oral  SpO2: 98% 100% 100%   Weight:      Height:        Intake/Output Summary (Last 24 hours) at 11/25/2020 1001 Last data filed at 11/24/2020 1300 Gross per 24 hour  Intake 280 ml  Output -  Net 280  ml   Filed Weights   11/22/20 1916 11/23/20 0154  Weight: 70.3 kg 67.7 kg    Examination: General exam: Appears calm and comfortable  Respiratory system: Clear to auscultation. Respiratory effort normal. Cardiovascular system: S1 & S2 heard, RRR. No pedal edema. Gastrointestinal system: Abdomen is nondistended, soft and nontender. Normal bowel sounds heard. Central nervous system: Alert and oriented. Non focal exam. Speech clear  Extremities: Symmetric in appearance bilaterally  Skin: No rashes, lesions or ulcers on exposed  skin  Psychiatry: Judgement and insight appear stable. Mood & affect appropriate.    Data Reviewed: I have personally reviewed following labs and imaging studies  CBC: Recent Labs  Lab 11/22/20 1944 11/23/20 0417 11/24/20 0327  WBC 14.0* 7.3 10.2  NEUTROABS  --  5.0  --   HGB 12.2* 11.2* 11.7*  HCT 36.5* 32.7* 35.3*  MCV 90.8 89.8 91.5  PLT 223 195 469   Basic Metabolic Panel: Recent Labs  Lab 11/22/20 1944 11/23/20 0417 11/24/20 0327  NA 138 138 135  K 3.8 3.4* 3.8  CL 101 103 102  CO2 28 26 27   GLUCOSE 133* 109* 104*  BUN 9 8 8   CREATININE 1.27* 1.19 1.14  CALCIUM 9.3 8.6* 8.5*   GFR: Estimated Creatinine Clearance: 54.4 mL/min (by C-G formula based on SCr of 1.14 mg/dL). Liver Function Tests: Recent Labs  Lab 11/22/20 1944 11/23/20 0417  AST 26 21  ALT 18 17  ALKPHOS 45 35*  BILITOT 0.6 1.0  PROT 6.8 5.7*  ALBUMIN 3.8 3.1*   Recent Labs  Lab 11/22/20 1944  LIPASE 21   No results for input(s): AMMONIA in the last 168 hours. Coagulation Profile: No results for input(s): INR, PROTIME in the last 168 hours. Cardiac Enzymes: No results for input(s): CKTOTAL, CKMB, CKMBINDEX, TROPONINI in the last 168 hours. BNP (last 3 results) No results for input(s): PROBNP in the last 8760 hours. HbA1C: No results for input(s): HGBA1C in the last 72 hours. CBG: No results for input(s): GLUCAP in the last 168 hours. Lipid Profile: No results for input(s): CHOL, HDL, LDLCALC, TRIG, CHOLHDL, LDLDIRECT in the last 72 hours. Thyroid Function Tests: No results for input(s): TSH, T4TOTAL, FREET4, T3FREE, THYROIDAB in the last 72 hours. Anemia Panel: No results for input(s): VITAMINB12, FOLATE, FERRITIN, TIBC, IRON, RETICCTPCT in the last 72 hours. Sepsis Labs: No results for input(s): PROCALCITON, LATICACIDVEN in the last 168 hours.  Recent Results (from the past 240 hour(s))  SARS CORONAVIRUS 2 (TAT 6-24 HRS) Nasopharyngeal Nasopharyngeal Swab     Status: None    Collection Time: 11/23/20 12:22 AM   Specimen: Nasopharyngeal Swab  Result Value Ref Range Status   SARS Coronavirus 2 NEGATIVE NEGATIVE Final    Comment: (NOTE) SARS-CoV-2 target nucleic acids are NOT DETECTED.  The SARS-CoV-2 RNA is generally detectable in upper and lower respiratory specimens during the acute phase of infection. Negative results do not preclude SARS-CoV-2 infection, do not rule out co-infections with other pathogens, and should not be used as the sole basis for treatment or other patient management decisions. Negative results must be combined with clinical observations, patient history, and epidemiological information. The expected result is Negative.  Fact Sheet for Patients: SugarRoll.be  Fact Sheet for Healthcare Providers: https://www.woods-mathews.com/  This test is not yet approved or cleared by the [place] FDA and  has been authorized for detection and/or diagnosis of SARS-CoV-2 by FDA under an Emergency Use Authorization (EUA). This EUA will remain  in effect (meaning  this test can be used) for the duration of the COVID-19 declaration under Se ction 564(b)(1) of the Act, 21 U.S.C. section 360bbb-3(b)(1), unless the authorization is terminated or revoked sooner.  Performed at [facility], [place] [address] [place], [place] [address]       Radiology Studies: NM Hepatobiliary Liver Func  Result Date: 11/23/2020 CLINICAL DATA:  Right upper quadrant pain. EXAM: NUCLEAR MEDICINE HEPATOBILIARY IMAGING TECHNIQUE: Sequential images of the abdomen were obtained out to 60 minutes following intravenous administration of radiopharmaceutical. RADIOPHARMACEUTICALS:  5.5 mCi Tc-68m  Choletec IV COMPARISON:  Ultrasound on 11/22/2020 FINDINGS: Prompt uptake and biliary excretion of activity by the liver is seen. Gallbladder activity is visualized, consistent with patency of cystic duct. Biliary activity passes into  small bowel, consistent with patent common bile duct. IMPRESSION: Normal hepatobiliary scan, demonstrating patency of cystic and common bile ducts. Electronically Signed   By: Marlaine Hind M.D.   On: 11/23/2020 12:11      Scheduled Meds: . clopidogrel  75 mg Oral Daily  . enoxaparin (LOVENOX) injection  40 mg Subcutaneous Daily  . fluticasone  2 spray Each Nare Daily  . lisinopril  10 mg Oral Daily   And  . hydrochlorothiazide  6.25 mg Oral Daily  . hydrocortisone  25 mg Rectal BID  . loratadine  10 mg Oral q morning  . pantoprazole  40 mg Oral Daily  . polyethylene glycol  17 g Oral BID  . pravastatin  40 mg Oral q morning  . saccharomyces boulardii  250 mg Oral BID  . senna-docusate  1 tablet Oral QHS  . simethicone  80 mg Oral QID   Continuous Infusions:   LOS: 0 days      Time spent: 25 minutes   Dessa Phi, DO [facility] 11/25/2020, 10:01 AM   Available via Epic secure chat 7am-7pm After these hours, please refer to coverage provider listed on [email or web address]

## 2020-11-25 NOTE — Plan of Care (Signed)
  Problem: Education: Goal: Knowledge of General Education information will improve Description Including pain rating scale, medication(s)/side effects and non-pharmacologic comfort measures Outcome: Progressing   

## 2020-11-26 DIAGNOSIS — R1010 Upper abdominal pain, unspecified: Secondary | ICD-10-CM | POA: Diagnosis not present

## 2020-11-26 DIAGNOSIS — R1030 Lower abdominal pain, unspecified: Secondary | ICD-10-CM | POA: Diagnosis not present

## 2020-11-26 DIAGNOSIS — R1084 Generalized abdominal pain: Secondary | ICD-10-CM | POA: Diagnosis not present

## 2020-11-26 DIAGNOSIS — K5909 Other constipation: Secondary | ICD-10-CM | POA: Diagnosis not present

## 2020-11-26 MED ORDER — POLYETHYLENE GLYCOL 3350 17 G PO PACK: 17.0000 g | PACK | Freq: Two times a day (BID) | ORAL | 0 refills | Status: AC

## 2020-11-26 NOTE — Progress Notes (Signed)
Subjective: The patient states that he had 3 liquid, loose, green bowel movements today. He denies any abdominal pain. He had eggs, sausage and [place] toast for breakfast. He feels that the abdomen is less distended.  Objective: Vital signs in last 24 hours: Temp:  [98.2 F (36.8 C)] 98.2 F (36.8 C) (03/28 0519) Pulse Rate:  [50] 50 (03/28 0519) Resp:  [16-18] 18 (03/28 0519) BP: (136-144)/(69) 136/69 (03/28 0519) SpO2:  [99 %-100 %] 100 % (03/28 0519) Weight change:  Last BM Date: 11/25/20  PE: Not in distress, lying comfortably on bed, able to speak in full sentences GENERAL: No obvious pallor, no obvious icterus ABDOMEN: Soft, nondistended, nontender, normoactive bowel sounds EXTREMITIES: Or deformity  Lab Results: No results found for this or any previous visit (from the past 48 hour(s)).  Studies/Results: No results found.  Medications: I have reviewed the patient's current medications.  Assessment: Lower abdominal pain(described as twisting of intestines and sharp pricking pain) likely related to moderate amount of stool noted throughout the colon due to constipation  Hepatic lesions, well differentiated neuroendocrine tumor with liver and nodal metastases, was on Lanreotide Prostate enlargement, history of prostate cancer, status post radiation  Plan: Okay to DC home from GI standpoint. Advised patient to take a high-fiber diet at home, increase intake of fruits, vegetables, whole grains and to drink at least 60 to 80 ounces of water a day. Advised to continue MiraLAX twice a day. Patient to follow-up with Dr. Cristina Gong in about 3 weeks.  Ronnette Juniper, MD 11/26/2020, 1:09 PM

## 2020-11-26 NOTE — Discharge Summary (Signed)
Physician Discharge Summary  NIKOLAI WILCZAK ACZ:[id number] DOB: Aug 10, 1946 DOA: 11/22/2020  PCP: Alroy Dust, L.Marlou Sa, MD  Admit date: 11/22/2020 Discharge date: 11/26/2020  Admitted From: Home Disposition:  Home  Recommendations for Outpatient Follow-up:  Follow up with Dr. Cristina Gong in 3 weeks Follow up with Dr. Burr Medico  Discharge Condition: Stable CODE STATUS: Full  Diet recommendation:  Diet Orders (From admission, onward)    Start     Ordered   11/23/20 1213  Diet regular Room service appropriate? Yes; Fluid consistency: Thin  Diet effective now       Question Answer Comment  Room service appropriate? Yes   Fluid consistency: Thin      11/23/20 1212         Brief/Interim Summary: Ryan Fitzsimmons Murphyis a 75 yo male with past medical history significant ofmetastatic neuroendocrine tumor, hypertension, hyperlipidemia, history of a stroke, prostate cancer, GERD who presented to the hospital with complaints of abdominal pain. For several years, patient has been experiencing intermittent bouts of abdominal pain, but worsening recently. He admits to right upper quadrant abdominal pain that feels like is in ice pick stabbing him, also admitting to some lower abdominal cramping. In the emergency department, work-up was concerning for gallbladder sludge, constipation. HIDA scan negative.  GI was consulted due to continued abdominal cramping.  He was trialed on Nulytely, bowel regimen.  He was discharged home on twice daily MiraLAX and to follow-up closely with GI as an outpatient.  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 -RUQ FU:XNATFTDDUKG sludge. No evidence of acute cholecystitis -HIDA scan:Normal hepatobiliary scan, demonstrating patency of cystic and common bile ducts -LFT, lipase normal -Appreciate oncology, thought pain is likely related to constipation or side effect of lanreotide, recommended GI consult if pain not improved -Appreciate GI, trialed nulytely. Discharge on BID miralax   Constipation -Continue bowel regimen  Metastatic malignant neuroendocrine tumor to the liver -Followed by Dr. Burr Medico  Hypertension -Continue lisinopril/HCTZ  GERD -Continue PPI   HLD -Continue pravachol   Hx CVA -Continue plavix    Discharge Instructions  Discharge Instructions    Call MD for:  difficulty breathing, headache or visual disturbances   Complete by: As directed    Call MD for:  extreme fatigue   Complete by: As directed    Call MD for:  persistant dizziness or light-headedness   Complete by: As directed    Call MD for:  persistant nausea and vomiting   Complete by: As directed    Call MD for:  severe uncontrolled pain   Complete by: As directed    Call MD for:  temperature >100.4   Complete by: As directed    Discharge instructions   Complete by: As directed    You were cared for by a hospitalist during your hospital stay. If you have any questions about your discharge medications or the care you received while you were in the hospital after you are discharged, you can call the unit and ask to speak  with the hospitalist on call if the hospitalist that took care of you is not available. Once you are discharged, your primary care physician will handle any further medical issues. Please note that NO REFILLS for any discharge medications will be authorized once you are discharged, as it is imperative that you return to your  primary care physician (or establish a relationship with a primary care physician if you do not have one) for your aftercare needs so that they can reassess your need for medications and monitor your lab values.   Increase activity slowly   Complete by: As directed      Allergies as of 11/26/2020      Reactions   Aspirin Nausea And Vomiting, Other (See Comments)   "sharp stomach pain and excessive saliva"   Simvastatin Other (See Comments)   "back and mid section achy pain"      Medication List    TAKE these medications   acetaminophen 500 MG tablet Commonly known as: TYLENOL Take 1,000 mg by mouth every 6 (six) hours as needed for headache (pain).   clopidogrel 75 MG tablet Commonly known as: PLAVIX Take 75 mg by mouth daily.   fluticasone 50 MCG/ACT nasal spray Commonly known as: FLONASE Place 2 sprays into both nostrils daily.   lisinopril-hydrochlorothiazide 20-12.5 MG tablet Commonly known as: ZESTORETIC Take 0.5 tablets by mouth every morning.   loratadine 10 MG tablet Commonly known as: CLARITIN Take 10 mg by mouth every morning.   OVER THE COUNTER MEDICATION Place 1 drop into both eyes daily as needed (dry eyes). Over the counter lubricating eye drop   pantoprazole 40 MG tablet Commonly known as: PROTONIX Take 1 tablet (40 mg total) by mouth daily.   polyethylene glycol 17 g packet Commonly known as: MIRALAX / GLYCOLAX Take 17 g by mouth 2 (two) times daily.   pravastatin 40 MG tablet Commonly known as: PRAVACHOL Take 40 mg by mouth every morning.   Symbicort 160-4.5 MCG/ACT inhaler Generic drug: budesonide-formoterol Inhale 1-2 puffs into the lungs as needed.   traMADol 50 MG tablet Commonly known as: ULTRAM Take 50-100 mg by mouth every 6 (six) hours as needed for moderate pain or severe pain.       Follow-up Information    Buccini, Robert, MD. Schedule an appointment as soon as possible for a visit in 3 week(s).   Specialty:  Gastroenterology Contact information: [address] [place] [address] [phone]        Truitt Merle, MD Follow up.   Specialties: Hematology, Oncology Contact information: [address] [place] [address] [phone]              Allergies  Allergen Reactions  . Aspirin Nausea And Vomiting and Other (See Comments)    "sharp stomach pain and excessive saliva"  . Simvastatin Other (See Comments)    "back and mid section achy pain"    Consultations:  Oncology  GI    Procedures/Studies: NM Hepatobiliary Liver Func  Result Date: 11/23/2020 CLINICAL DATA:  Right upper quadrant pain. EXAM: NUCLEAR MEDICINE HEPATOBILIARY IMAGING TECHNIQUE: Sequential images of the abdomen were obtained out to 60 minutes following intravenous administration of radiopharmaceutical. RADIOPHARMACEUTICALS:  5.5 mCi Tc-73m  Choletec IV COMPARISON:  Ultrasound on 11/22/2020 FINDINGS: Prompt uptake and biliary excretion of activity by the liver is seen. Gallbladder activity is visualized, consistent with patency of cystic duct. Biliary activity passes into small bowel, consistent with patent common bile duct. IMPRESSION: Normal  hepatobiliary scan, demonstrating patency of cystic and common bile ducts. Electronically Signed   By: Marlaine Hind M.D.   On: 11/23/2020 12:11   CT Abdomen Pelvis W Contrast  Result Date: 11/22/2020 CLINICAL DATA:  Abdominal pain EXAM: CT ABDOMEN AND PELVIS WITH CONTRAST TECHNIQUE: Multidetector CT imaging of the abdomen and pelvis was performed using the standard protocol following bolus administration of intravenous contrast. CONTRAST:  64mL OMNIPAQUE IOHEXOL 300 MG/ML  SOLN COMPARISON:  07/31/2020 FINDINGS: Lower chest: Lung bases are clear. No effusions. Heart is normal size. Hepatobiliary: Low-density area posteriorly in the liver measures 15 mm. 18 mm lesion in the inferior right hepatic lobe. These areas are stable when compared to prior  MRI. Gallbladder unremarkable. Pancreas: No focal abnormality or ductal dilatation. Spleen: No focal abnormality.  Normal size. Adrenals/Urinary Tract: No adrenal mass. No hydronephrosis or suspicious renal mass. Urinary bladder unremarkable. Stomach/Bowel: Moderate stool in the colon. Stomach, large and small bowel grossly unremarkable. Vascular/Lymphatic: Aortic atherosclerosis. No evidence of aneurysm or adenopathy. Reproductive: Prostate enlargement. Other: No evidence of aneurysm or adenopathy. Musculoskeletal: No acute bony abnormality. IMPRESSION: 15 mm and 18 mm lesions within the right hepatic lobe, stable since prior MRI. Moderate stool throughout the colon. Aortic atherosclerosis. Prostate enlargement. No acute findings in the abdomen or pelvis. Electronically Signed   By: Rolm Baptise M.D.   On: 11/22/2020 22:38   US Abdomen Limited RUQ (LIVER/GB)  Result Date: 11/22/2020 CLINICAL DATA:  Right upper quadrant pain EXAM: ULTRASOUND ABDOMEN LIMITED RIGHT UPPER QUADRANT COMPARISON:  None. FINDINGS: Gallbladder: Layering gallbladder sludge is seen. The gallbladder wall measures 2.4 mm. No sonographic Widmayer sign noted by sonographer. Common bile duct: Diameter: 4 mm Liver: Again noted is a echogenic lesion within the anterior right liver lobe measuring 1.9 x 1.8 x 2.0 cm. Portal vein is patent on color Doppler imaging with normal direction of blood flow towards the liver. Other: None. IMPRESSION: Gallbladder sludge.  No evidence of acute cholecystitis Stable lesion within the right hepatic lobe measuring 1.8 x 2.0 cm Electronically Signed   By: Prudencio Pair M.D.   On: 11/22/2020 23:44      Discharge Exam: Vitals:   11/26/20 0519 11/26/20 1316  BP: 136/69 (!) 150/72  Pulse: (!) 50 (!) 55  Resp: 18 18  Temp: 98.2 F (36.8 C) 98.3 F (36.8 C)  SpO2: 100% 99%    General: Pt is alert, awake, not in acute distress Cardiovascular: RRR, S1/S2 +, no edema Respiratory: CTA bilaterally, no  wheezing, no rhonchi, no respiratory distress, no conversational dyspnea  Abdominal: Soft, NT, ND, bowel sounds + Extremities: no edema, no cyanosis Psych: Normal mood and affect, stable judgement and insight     The results of significant diagnostics from this hospitalization (including imaging, microbiology, ancillary and laboratory) are listed below for reference.     Microbiology: Recent Results (from the past 240 hour(s))  SARS CORONAVIRUS 2 (TAT 6-24 HRS) Nasopharyngeal Nasopharyngeal Swab     Status: None   Collection Time: 11/23/20 12:22 AM   Specimen: Nasopharyngeal Swab  Result Value Ref Range Status   SARS Coronavirus 2 NEGATIVE NEGATIVE Final    Comment: (NOTE) SARS-CoV-2 target nucleic acids are NOT DETECTED.  The SARS-CoV-2 RNA is generally detectable in upper and lower respiratory specimens during the acute phase of infection. Negative results do not preclude SARS-CoV-2 infection, do not rule out co-infections with other pathogens, and should not be used as the sole basis for treatment or other patient management decisions.  Negative results must be combined with clinical observations, patient history, and epidemiological information. The expected result is Negative.  Fact Sheet for Patients: SugarRoll.be  Fact Sheet for Healthcare Providers: https://www.woods-mathews.com/  This test is not yet approved or cleared by the [place] FDA and  has been authorized for detection and/or diagnosis of SARS-CoV-2 by FDA under an Emergency Use Authorization (EUA). This EUA will remain  in effect (meaning this test can be used) for the duration of the COVID-19 declaration under Se ction 564(b)(1) of the Act, 21 U.S.C. section 360bbb-3(b)(1), unless the authorization is terminated or revoked sooner.  Performed at [facility], [place] [address], [place], [place] [address]      Labs: BNP (last 3 results) No results for  input(s): BNP in the last 8760 hours. Basic Metabolic Panel: Recent Labs  Lab 11/22/20 1944 11/23/20 0417 11/24/20 0327  NA 138 138 135  K 3.8 3.4* 3.8  CL 101 103 102  CO2 28 26 27   GLUCOSE 133* 109* 104*  BUN 9 8 8   CREATININE 1.27* 1.19 1.14  CALCIUM 9.3 8.6* 8.5*   Liver Function Tests: Recent Labs  Lab 11/22/20 1944 11/23/20 0417  AST 26 21  ALT 18 17  ALKPHOS 45 35*  BILITOT 0.6 1.0  PROT 6.8 5.7*  ALBUMIN 3.8 3.1*   Recent Labs  Lab 11/22/20 1944  LIPASE 21   No results for input(s): AMMONIA in the last 168 hours. CBC: Recent Labs  Lab 11/22/20 1944 11/23/20 0417 11/24/20 0327  WBC 14.0* 7.3 10.2  NEUTROABS  --  5.0  --   HGB 12.2* 11.2* 11.7*  HCT 36.5* 32.7* 35.3*  MCV 90.8 89.8 91.5  PLT 223 195 193   Cardiac Enzymes: No results for input(s): CKTOTAL, CKMB, CKMBINDEX, TROPONINI in the last 168 hours. BNP: Invalid input(s): POCBNP CBG: No results for input(s): GLUCAP in the last 168 hours. D-Dimer No results for input(s): DDIMER in the last 72 hours. Hgb A1c No results for input(s): HGBA1C in the last 72 hours. Lipid Profile No results for input(s): CHOL, HDL, LDLCALC, TRIG, CHOLHDL, LDLDIRECT in the last 72 hours. Thyroid function studies No results for input(s): TSH, T4TOTAL, T3FREE, THYROIDAB in the last 72 hours.  Invalid input(s): FREET3 Anemia work up No results for input(s): VITAMINB12, FOLATE, FERRITIN, TIBC, IRON, RETICCTPCT in the last 72 hours. Urinalysis    Component Value Date/Time   COLORURINE YELLOW 11/22/2020 2005   APPEARANCEUR HAZY (A) 11/22/2020 2005   LABSPEC 1.023 11/22/2020 2005   PHURINE 6.0 11/22/2020 2005   GLUCOSEU NEGATIVE 11/22/2020 2005   HGBUR MODERATE (A) 11/22/2020 2005   BILIRUBINUR NEGATIVE 11/22/2020 2005   KETONESUR 20 (A) 11/22/2020 2005   PROTEINUR NEGATIVE 11/22/2020 2005   NITRITE NEGATIVE 11/22/2020 2005   LEUKOCYTESUR NEGATIVE 11/22/2020 2005   Sepsis Labs Invalid input(s):  PROCALCITONIN,  WBC,  LACTICIDVEN Microbiology Recent Results (from the past 240 hour(s))  SARS CORONAVIRUS 2 (TAT 6-24 HRS) Nasopharyngeal Nasopharyngeal Swab     Status: None   Collection Time: 11/23/20 12:22 AM   Specimen: Nasopharyngeal Swab  Result Value Ref Range Status   SARS Coronavirus 2 NEGATIVE NEGATIVE Final    Comment: (NOTE) SARS-CoV-2 target nucleic acids are NOT DETECTED.  The SARS-CoV-2 RNA is generally detectable in upper and lower respiratory specimens during the acute phase of infection. Negative results do not preclude SARS-CoV-2 infection, do not rule out co-infections with other pathogens, and should not be used as the sole basis for  treatment or other patient management decisions. Negative results must be combined with clinical observations, patient history, and epidemiological information. The expected result is Negative.  Fact Sheet for Patients: SugarRoll.be  Fact Sheet for Healthcare Providers: https://www.woods-mathews.com/  This test is not yet approved or cleared by the [place] FDA and  has been authorized for detection and/or diagnosis of SARS-CoV-2 by FDA under an Emergency Use Authorization (EUA). This EUA will remain  in effect (meaning this test can be used) for the duration of the COVID-19 declaration under Se ction 564(b)(1) of the Act, 21 U.S.C. section 360bbb-3(b)(1), unless the authorization is terminated or revoked sooner.  Performed at [facility], [place] [address], [place], [place] [address]      Patient was seen and examined on the day of discharge and was found to be in stable condition. Time coordinating discharge: 25 minutes including assessment and coordination of care, as well as examination of the patient.   SIGNED:  Dessa Phi, DO [facility] 11/26/2020, 1:21 PM

## 2020-11-26 NOTE — Progress Notes (Signed)
Discharge instructions reviewed with pt. Pt verbalized understanding and had no questions.  Pt discharged in stable condition via wheelchair with daughter.  Ryan Wilkerson

## 2020-12-03 ENCOUNTER — Telehealth: Payer: Self-pay | Admitting: Hematology

## 2020-12-03 NOTE — Telephone Encounter (Signed)
Scheduled per 4/4 sch msg. Called and spoke with pt confirmed 4/14 appt

## 2020-12-13 ENCOUNTER — Inpatient Hospital Stay: Payer: Medicare HMO

## 2020-12-13 ENCOUNTER — Inpatient Hospital Stay: Payer: Medicare HMO | Admitting: Hematology

## 2020-12-24 NOTE — Progress Notes (Signed)
..  The following Medication: Somatuline has been approved thru [employer] as Assistance Program. Enrollment period is 12/19/2020 to 08/31/2021.  Assistance [id number].  Reason for Assistance: EOOP First DOS: 01/10/2021.

## 2020-12-27 DIAGNOSIS — R1084 Generalized abdominal pain: Secondary | ICD-10-CM | POA: Diagnosis not present

## 2020-12-27 DIAGNOSIS — K5901 Slow transit constipation: Secondary | ICD-10-CM | POA: Diagnosis not present

## 2020-12-27 DIAGNOSIS — D3A8 Other benign neuroendocrine tumors: Secondary | ICD-10-CM | POA: Diagnosis not present

## 2020-12-27 DIAGNOSIS — R634 Abnormal weight loss: Secondary | ICD-10-CM | POA: Diagnosis not present

## 2020-12-27 DIAGNOSIS — R6881 Early satiety: Secondary | ICD-10-CM | POA: Diagnosis not present

## 2021-01-01 DIAGNOSIS — C61 Malignant neoplasm of prostate: Secondary | ICD-10-CM | POA: Diagnosis not present

## 2021-01-03 ENCOUNTER — Telehealth: Payer: Self-pay

## 2021-01-03 NOTE — Telephone Encounter (Signed)
Spoke with patient regarding appointments.  I explained that when he comes in on 5/12 for his injection he will need to get blood drawn at 1:15 that day (for his upcoming CT scan).  CT scan has been scheduled for 01/31/2021 to [facility] at [facility] at 11:15, he knows to be NPO for 4 hours prior (nothing past 7:30 am) and to drink the oral prep (which he already has been given) one bottle 2 hours prior and the second bottle one hour prior.  The patient verbalized an understanding and was able to repeat all this back to me.

## 2021-01-08 DIAGNOSIS — R3915 Urgency of urination: Secondary | ICD-10-CM | POA: Diagnosis not present

## 2021-01-08 DIAGNOSIS — C61 Malignant neoplasm of prostate: Secondary | ICD-10-CM | POA: Diagnosis not present

## 2021-01-09 ENCOUNTER — Encounter: Payer: Self-pay | Admitting: Hematology

## 2021-01-10 ENCOUNTER — Other Ambulatory Visit: Payer: Self-pay

## 2021-01-10 ENCOUNTER — Inpatient Hospital Stay: Payer: Medicare HMO

## 2021-01-10 ENCOUNTER — Inpatient Hospital Stay: Payer: Medicare HMO | Attending: Nurse Practitioner

## 2021-01-10 VITALS — BP 158/78 | HR 61 | Temp 98.4°F | Resp 16

## 2021-01-10 DIAGNOSIS — Z7189 Other specified counseling: Secondary | ICD-10-CM

## 2021-01-10 DIAGNOSIS — C7A00 Malignant carcinoid tumor of unspecified site: Secondary | ICD-10-CM | POA: Insufficient documentation

## 2021-01-10 DIAGNOSIS — Z8546 Personal history of malignant neoplasm of prostate: Secondary | ICD-10-CM | POA: Insufficient documentation

## 2021-01-10 DIAGNOSIS — C7B02 Secondary carcinoid tumors of liver: Secondary | ICD-10-CM | POA: Insufficient documentation

## 2021-01-10 DIAGNOSIS — K6389 Other specified diseases of intestine: Secondary | ICD-10-CM

## 2021-01-10 DIAGNOSIS — C7B8 Other secondary neuroendocrine tumors: Secondary | ICD-10-CM

## 2021-01-10 LAB — CBC WITH DIFFERENTIAL (CANCER CENTER ONLY)
Abs Immature Granulocytes: 0.01 10*3/uL (ref 0.00–0.07)
Basophils Absolute: 0 10*3/uL (ref 0.0–0.1)
Basophils Relative: 0 %
Eosinophils Absolute: 0.3 10*3/uL (ref 0.0–0.5)
Eosinophils Relative: 6 %
HCT: 33.9 % — ABNORMAL LOW (ref 39.0–52.0)
Hemoglobin: 11.2 g/dL — ABNORMAL LOW (ref 13.0–17.0)
Immature Granulocytes: 0 %
Lymphocytes Relative: 28 %
Lymphs Abs: 1.4 10*3/uL (ref 0.7–4.0)
MCH: 30.2 pg (ref 26.0–34.0)
MCHC: 33 g/dL (ref 30.0–36.0)
MCV: 91.4 fL (ref 80.0–100.0)
Monocytes Absolute: 0.4 10*3/uL (ref 0.1–1.0)
Monocytes Relative: 8 %
Neutro Abs: 2.8 10*3/uL (ref 1.7–7.7)
Neutrophils Relative %: 58 %
Platelet Count: 212 10*3/uL (ref 150–400)
RBC: 3.71 MIL/uL — ABNORMAL LOW (ref 4.22–5.81)
RDW: 11.8 % (ref 11.5–15.5)
WBC Count: 4.9 10*3/uL (ref 4.0–10.5)
nRBC: 0 % (ref 0.0–0.2)

## 2021-01-10 LAB — CMP (CANCER CENTER ONLY)
ALT: 18 U/L (ref 0–44)
AST: 20 U/L (ref 15–41)
Albumin: 3.5 g/dL (ref 3.5–5.0)
Alkaline Phosphatase: 52 U/L (ref 38–126)
Anion gap: 7 (ref 5–15)
BUN: 8 mg/dL (ref 8–23)
CO2: 26 mmol/L (ref 22–32)
Calcium: 8.7 mg/dL — ABNORMAL LOW (ref 8.9–10.3)
Chloride: 107 mmol/L (ref 98–111)
Creatinine: 1.11 mg/dL (ref 0.61–1.24)
GFR, Estimated: >60 mL/min (ref >60)
Glucose, Bld: 138 mg/dL — ABNORMAL HIGH (ref 70–99)
Potassium: 3.6 mmol/L (ref 3.5–5.1)
Sodium: 140 mmol/L (ref 135–145)
Total Bilirubin: 0.7 mg/dL (ref 0.3–1.2)
Total Protein: 6.2 g/dL — ABNORMAL LOW (ref 6.5–8.1)

## 2021-01-10 MED ORDER — LANREOTIDE ACETATE 120 MG/0.5ML ~~LOC~~ SOLN
120.0000 mg | Freq: Once | SUBCUTANEOUS | Status: AC
  Administered 2021-01-10: 120 mg via SUBCUTANEOUS
  Filled 2021-01-10: qty 120

## 2021-01-10 NOTE — Patient Instructions (Signed)
Lanreotide injection What is this medicine? LANREOTIDE (lan REE oh tide) is used to reduce blood levels of growth hormone in patients with a condition called acromegaly. It also works to slow or stop tumor growth in patients with neuroendocrine tumors and treat carcinoid syndrome. This medicine may be used for other purposes; ask your health care provider or pharmacist if you have questions. COMMON BRAND NAME(S): Somatuline [place] What should I tell my health care provider before I take this medicine? They need to know if you have any of these conditions:  diabetes  gallbladder disease  heart disease  kidney disease  liver disease  thyroid disease  an unusual or allergic reaction to lanreotide, other medicines, foods, dyes, or preservatives  pregnant or trying to get pregnant  breast-feeding How should I use this medicine? This medicine is for injection under the skin. It is given by a health care professional in a hospital or clinic setting. Contact your pediatrician or health care professional regarding the use of this medicine in children. Special care may be needed. Overdosage: If you think you have taken too much of this medicine contact a poison control center or emergency room at once. NOTE: This medicine is only for you. Do not share this medicine with others. What if I miss a dose? It is important not to miss your dose. Call your doctor or health care professional if you are unable to keep an appointment. What may interact with this medicine? This medicine may interact with the following medications:  bromocriptine  cyclosporine  certain medicines for blood pressure, heart disease, irregular heart beat  certain medicines for diabetes  quinidine  terfenadine This list may not describe all possible interactions. Give your health care provider a list of all the medicines, herbs, non-prescription drugs, or dietary supplements you use. Also tell them if you smoke,  drink alcohol, or use illegal drugs. Some items may interact with your medicine. What should I watch for while using this medicine? Tell your doctor or healthcare professional if your symptoms do not start to get better or if they get worse. Visit your doctor or health care professional for regular checks on your progress. Your condition will be monitored carefully while you are receiving this medicine. This medicine may increase blood sugar. Ask your healthcare provider if changes in diet or medicines are needed if you have diabetes. You may need blood work done while you are taking this medicine. Women should inform their doctor if they wish to become pregnant or think they might be pregnant. There is a potential for serious side effects to an unborn child. Talk to your health care professional or pharmacist for more information. Do not breast-feed an infant while taking this medicine or for 6 months after stopping it. This medicine has caused ovarian failure in some women. This medicine may interfere with the ability to have a child. Talk with your doctor or health care professional if you are concerned about your fertility. What side effects may I notice from receiving this medicine? Side effects that you should report to your doctor or health care professional as soon as possible:  allergic reactions like skin rash, itching or hives, swelling of the face, lips, or tongue  increased blood pressure  severe stomach pain  signs and symptoms of hgh blood sugar such as being more thirsty or hungry or having to urinate more than normal. You may also feel very tired or have blurry vision.  signs and symptoms of low blood   sugar such as feeling anxious; confusion; dizziness; increased hunger; unusually weak or tired; sweating; shakiness; cold; irritable; headache; blurred vision; fast heartbeat; loss of consciousness  unusually slow heartbeat Side effects that usually do not require medical  attention (report to your doctor or health care professional if they continue or are bothersome):  constipation  diarrhea  dizziness  headache  muscle pain  muscle spasms  nausea  pain, redness, or irritation at site where injected This list may not describe all possible side effects. Call your doctor for medical advice about side effects. You may report side effects to FDA at 1-800-FDA-1088. Where should I keep my medicine? This drug is given in a hospital or clinic and will not be stored at home. NOTE: This sheet is a summary. It may not cover all possible information. If you have questions about this medicine, talk to your doctor, pharmacist, or health care provider.  2021 Elsevier/Gold Standard (2018-05-27 09:13:08)  

## 2021-01-14 LAB — CHROMOGRANIN A: Chromogranin A (ng/mL): 87.1 ng/mL (ref 0.0–101.8)

## 2021-01-16 ENCOUNTER — Telehealth: Payer: Self-pay

## 2021-01-16 NOTE — Telephone Encounter (Signed)
Spoke with pt reviewed most recent lab results al;l stable no concerns  Encouraged to call for questions concerns or changes nothing further call ended

## 2021-01-16 NOTE — Telephone Encounter (Signed)
-----   Message from Alla Feeling, NP sent at 01/15/2021 10:49 PM EDT ----- Please let him know tumor marker is in normal range, and other labs stable. No concerns,  Thanks, Regan Rakers, NP

## 2021-01-31 ENCOUNTER — Other Ambulatory Visit: Payer: Self-pay

## 2021-01-31 ENCOUNTER — Ambulatory Visit (HOSPITAL_COMMUNITY)
Admission: RE | Admit: 2021-01-31 | Discharge: 2021-01-31 | Disposition: A | Discharge status: Home / Self Care | Payer: Medicare HMO | Source: Ambulatory Visit | Attending: Hematology | Admitting: Hematology

## 2021-01-31 DIAGNOSIS — C787 Secondary malignant neoplasm of liver and intrahepatic bile duct: Secondary | ICD-10-CM | POA: Insufficient documentation

## 2021-01-31 DIAGNOSIS — I700 Atherosclerosis of aorta: Secondary | ICD-10-CM | POA: Diagnosis not present

## 2021-01-31 DIAGNOSIS — C7A8 Other malignant neuroendocrine tumors: Secondary | ICD-10-CM | POA: Diagnosis not present

## 2021-01-31 DIAGNOSIS — N400 Enlarged prostate without lower urinary tract symptoms: Secondary | ICD-10-CM | POA: Diagnosis not present

## 2021-01-31 DIAGNOSIS — C7B8 Other secondary neuroendocrine tumors: Secondary | ICD-10-CM | POA: Diagnosis not present

## 2021-01-31 DIAGNOSIS — K769 Liver disease, unspecified: Secondary | ICD-10-CM | POA: Diagnosis not present

## 2021-01-31 IMAGING — CT CT ABD-PELV W/ CM
2 of 5 series · 16 of 46 positions shown, 18 images · IV contrast (OMNIPAQUE)
Comparison: [DATE]

CLINICAL DATA: Metastatic neuroendocrine tumor restaging, liver and
lymph node metastases, ongoing lanreotide therapy

EXAM:
CT ABDOMEN AND PELVIS WITH CONTRAST
TECHNIQUE: Multidetector CT imaging of the abdomen and pelvis was performed
using the standard protocol following bolus administration of
intravenous contrast.
CONTRAST:  100mL OMNIPAQUE IOHEXOL 300 MG/ML SOLN, additional oral
enteric contrast

[Series 2: axial st · axial · 0.73mm/px · z∈[-787,-427]mm · 13 of 84 slices shown, 15 images]
[im 6/84  soft-tissue]
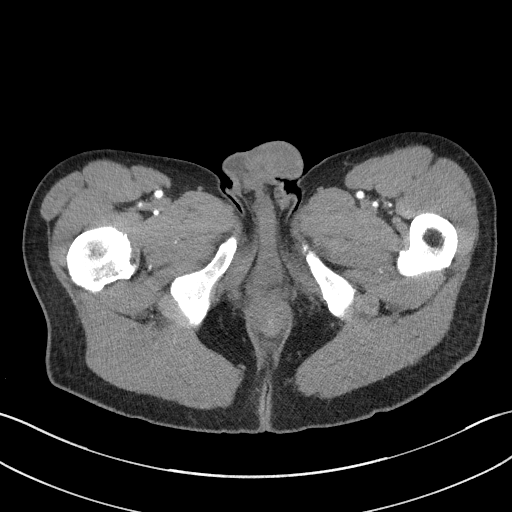
[im 6/84  bone]
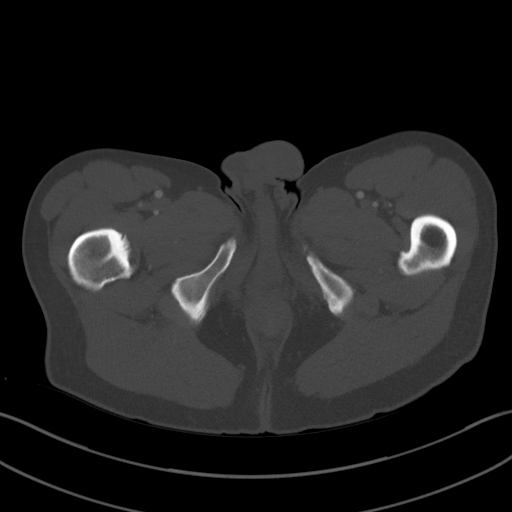
[im 12/84  soft-tissue]
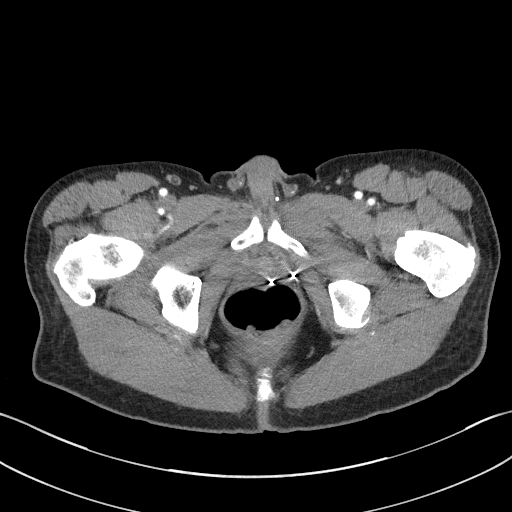
[im 17/84  soft-tissue]
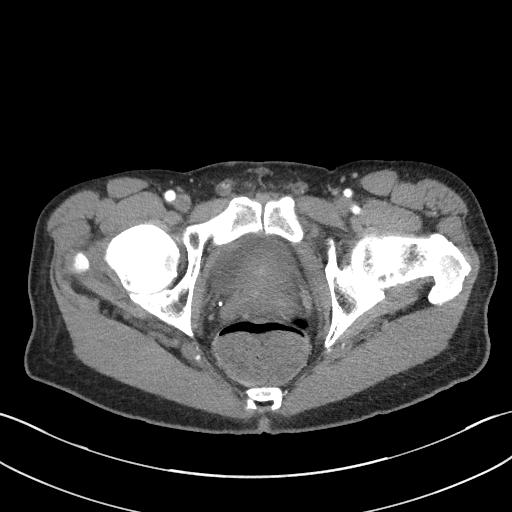
[im 23/84  soft-tissue]
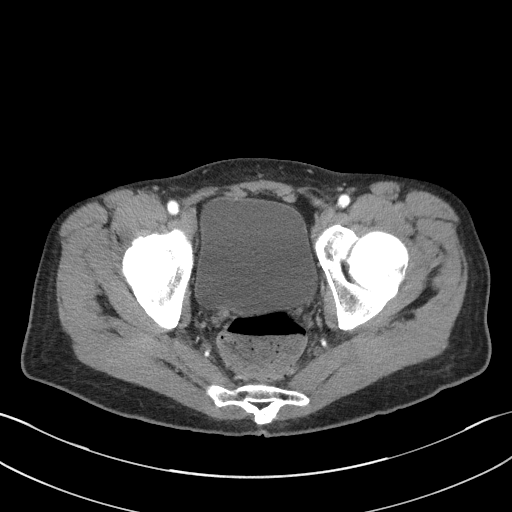
[im 28/84  soft-tissue]
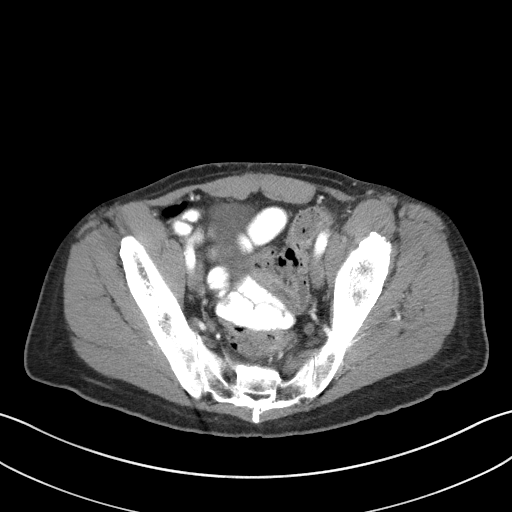
[im 34/84  soft-tissue]
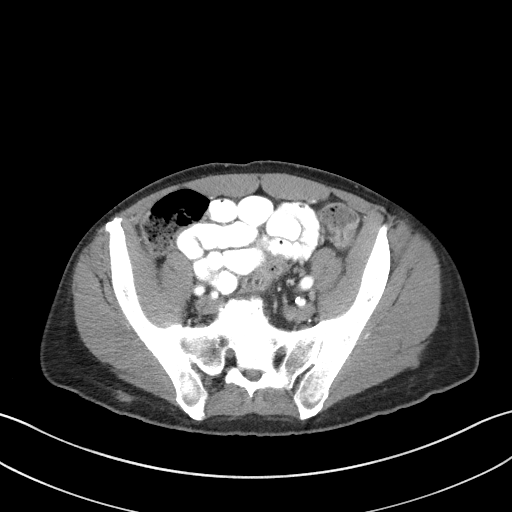
[im 45/84  soft-tissue]
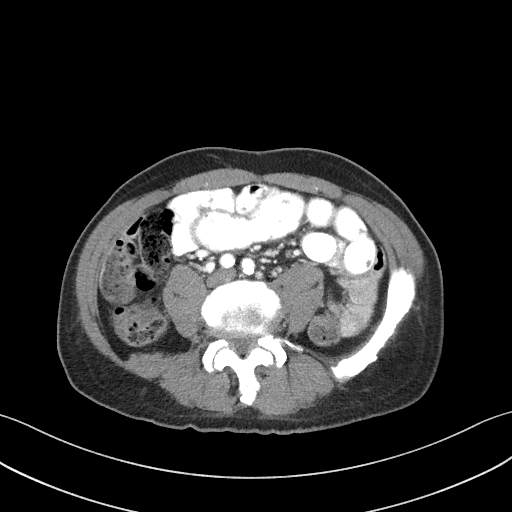
[im 50/84  soft-tissue]
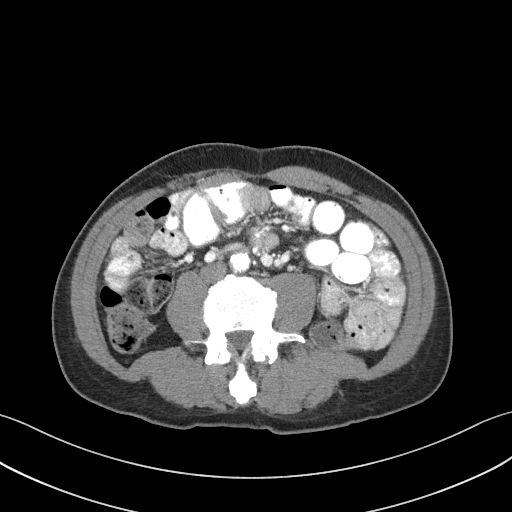
[im 56/84  soft-tissue]
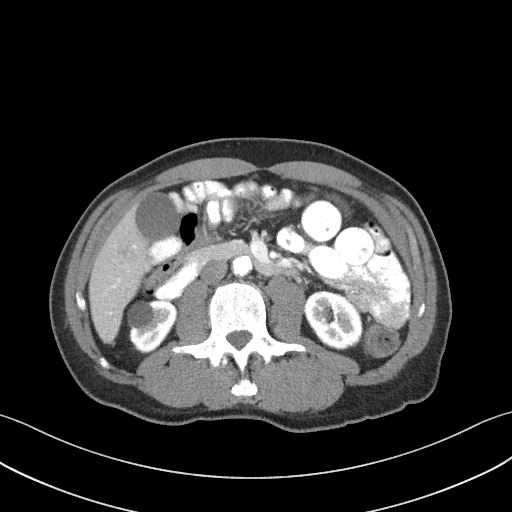
[im 56/84  bone]
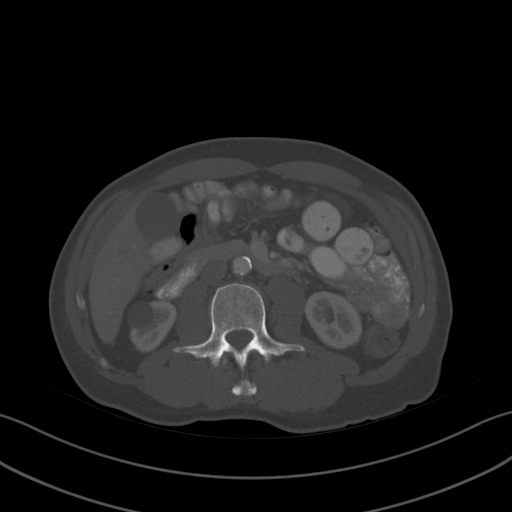
[im 61/84  soft-tissue]
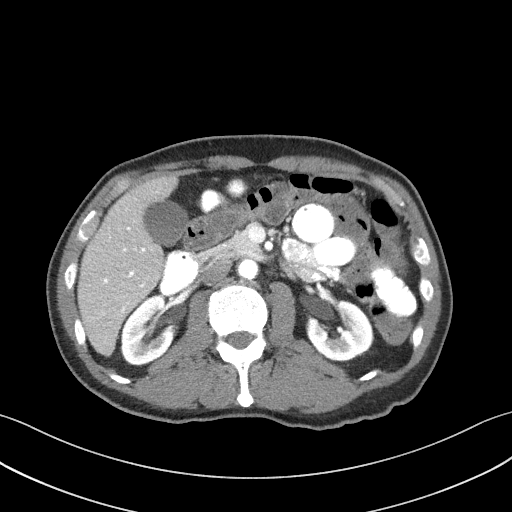
[im 67/84  soft-tissue]
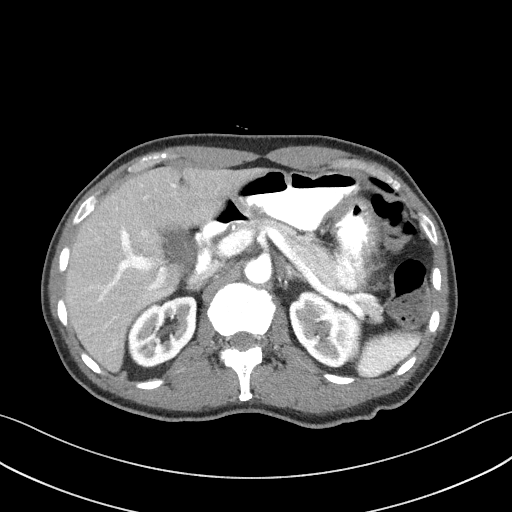
[im 72/84  soft-tissue]
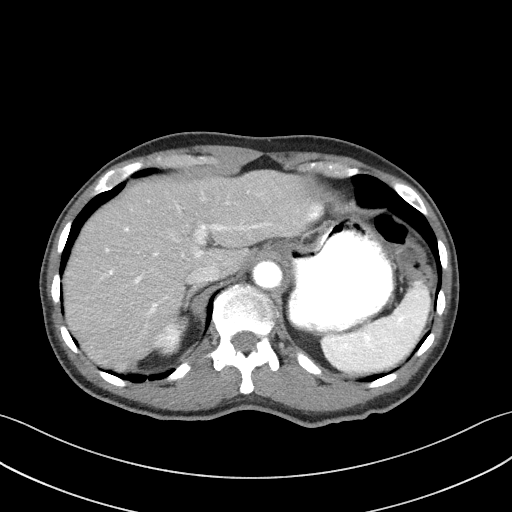
[im 78/84  soft-tissue]
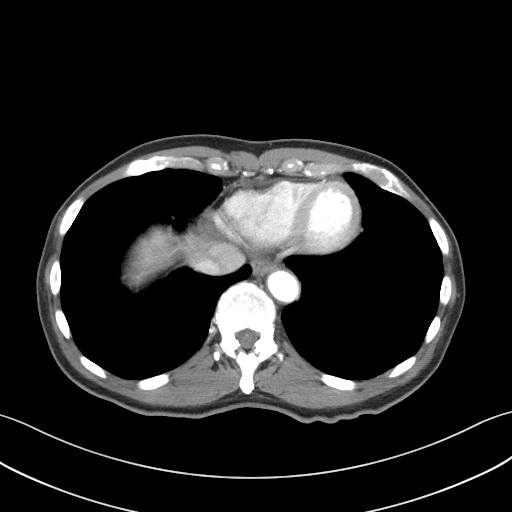

[Series 4: coronal st · coronal · 0.77mm/px · 3 of 70 slices shown]
[im 24/70  soft-tissue]
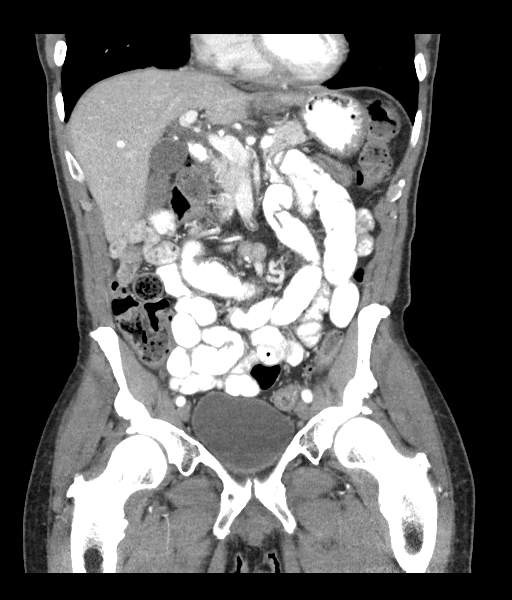
[im 31/70  soft-tissue]
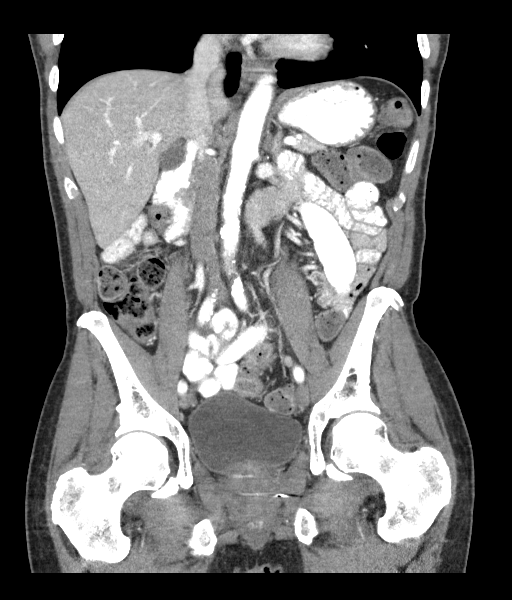
[im 39/70  soft-tissue]
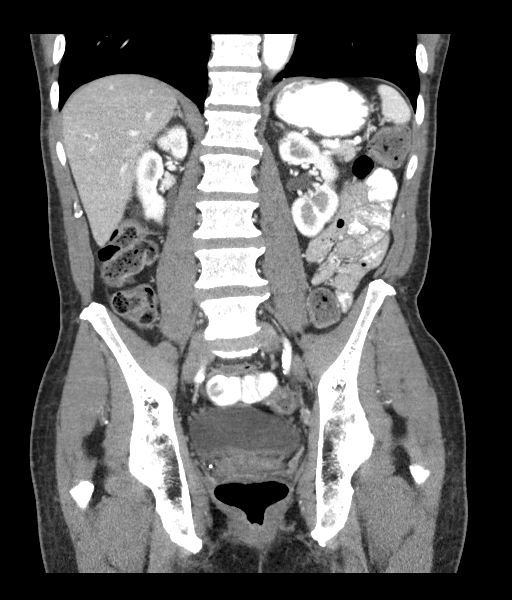

[16 of 46 positions shown; findings below may reference images not displayed]

FINDINGS: Lower chest: No acute abnormality.

Hepatobiliary: Interval decrease in size of a hypodense lesion of
the inferior right lobe of the liver, hepatic segment VI, measuring
1.6 x 1.3 cm, previously 1.9 x 1.8 cm when measured similarly
(series 2, image 28). Interval decrease in size of a hypodense
lesion of the posterior peripheral liver dome, hepatic segment VII,
now only minimally appreciated measuring no greater than 0.8 x
cm, previously 1.5 x 1.3 cm when measured similarly (series 2, image
11). No gallstones, gallbladder wall thickening, or biliary
dilatation.

Pancreas: Unremarkable. No pancreatic ductal dilatation or
surrounding inflammatory changes.

Spleen: Normal in size without significant abnormality.

Adrenals/Urinary Tract: Adrenal glands are unremarkable. Kidneys are
normal, without renal calculi, solid lesion, or hydronephrosis.
Bladder is unremarkable.

Stomach/Bowel: Stomach is within normal limits. Appendix appears
normal. No evidence of bowel wall thickening, distention, or
inflammatory changes.

Vascular/Lymphatic: Aortic atherosclerosis. No enlarged abdominal or
pelvic lymph nodes.

Reproductive: Mild prostatomegaly. Marking clips in the prostate
parenchyma.

Other: No abdominal wall hernia or abnormality. No abdominopelvic
ascites.

Musculoskeletal: No acute or significant osseous findings.
IMPRESSION: 1. Interval decrease in size of hypodense lesions of the inferior
right lobe of the liver and posterior liver dome findings are
consistent with treatment response of metastatic lesions.
2. No evidence of new metastatic disease in the abdomen or pelvis.
3. Prostatomegaly.

Aortic Atherosclerosis ([IV]-[IV]).

## 2021-01-31 MED ORDER — IOHEXOL 300 MG/ML  SOLN
100.0000 mL | Freq: Once | INTRAMUSCULAR | Status: AC | PRN
  Administered 2021-01-31: 100 mL via INTRAVENOUS

## 2021-01-31 MED ORDER — SODIUM CHLORIDE (PF) 0.9 % IJ SOLN
INTRAMUSCULAR | Status: AC
  Filled 2021-01-31: qty 50

## 2021-02-05 ENCOUNTER — Other Ambulatory Visit: Payer: Medicare HMO

## 2021-02-07 ENCOUNTER — Inpatient Hospital Stay: Payer: Medicare HMO | Attending: Nurse Practitioner | Admitting: Hematology

## 2021-02-07 ENCOUNTER — Other Ambulatory Visit: Payer: Self-pay

## 2021-02-07 ENCOUNTER — Encounter: Payer: Self-pay | Admitting: Hematology

## 2021-02-07 ENCOUNTER — Inpatient Hospital Stay: Payer: Medicare HMO

## 2021-02-07 VITALS — BP 132/62 | HR 88 | Temp 97.6°F | Resp 17 | Ht 68.0 in | Wt 148.2 lb

## 2021-02-07 DIAGNOSIS — Z923 Personal history of irradiation: Secondary | ICD-10-CM | POA: Insufficient documentation

## 2021-02-07 DIAGNOSIS — Z8546 Personal history of malignant neoplasm of prostate: Secondary | ICD-10-CM | POA: Insufficient documentation

## 2021-02-07 DIAGNOSIS — C7B02 Secondary carcinoid tumors of liver: Secondary | ICD-10-CM | POA: Diagnosis not present

## 2021-02-07 DIAGNOSIS — C7A00 Malignant carcinoid tumor of unspecified site: Secondary | ICD-10-CM | POA: Diagnosis not present

## 2021-02-07 DIAGNOSIS — C7B8 Other secondary neuroendocrine tumors: Secondary | ICD-10-CM

## 2021-02-07 DIAGNOSIS — Z7189 Other specified counseling: Secondary | ICD-10-CM

## 2021-02-07 MED ORDER — LANREOTIDE ACETATE 120 MG/0.5ML ~~LOC~~ SOLN
120.0000 mg | Freq: Once | SUBCUTANEOUS | Status: AC
  Administered 2021-02-07: 120 mg via SUBCUTANEOUS
  Filled 2021-02-07: qty 120

## 2021-02-07 NOTE — Patient Instructions (Signed)
Lanreotide injection What is this medicine? LANREOTIDE (lan REE oh tide) is used to reduce blood levels of growth hormone in patients with a condition called acromegaly. It also works to slow or stop tumor growth in patients with neuroendocrine tumors and treat carcinoid syndrome. This medicine may be used for other purposes; ask your health care provider or pharmacist if you have questions. COMMON BRAND NAME(S): Somatuline [place] What should I tell my health care provider before I take this medicine? They need to know if you have any of these conditions:  diabetes  gallbladder disease  heart disease  kidney disease  liver disease  thyroid disease  an unusual or allergic reaction to lanreotide, other medicines, foods, dyes, or preservatives  pregnant or trying to get pregnant  breast-feeding How should I use this medicine? This medicine is for injection under the skin. It is given by a health care professional in a hospital or clinic setting. Contact your pediatrician or health care professional regarding the use of this medicine in children. Special care may be needed. Overdosage: If you think you have taken too much of this medicine contact a poison control center or emergency room at once. NOTE: This medicine is only for you. Do not share this medicine with others. What if I miss a dose? It is important not to miss your dose. Call your doctor or health care professional if you are unable to keep an appointment. What may interact with this medicine? This medicine may interact with the following medications:  bromocriptine  cyclosporine  certain medicines for blood pressure, heart disease, irregular heart beat  certain medicines for diabetes  quinidine  terfenadine This list may not describe all possible interactions. Give your health care provider a list of all the medicines, herbs, non-prescription drugs, or dietary supplements you use. Also tell them if you smoke,  drink alcohol, or use illegal drugs. Some items may interact with your medicine. What should I watch for while using this medicine? Tell your doctor or healthcare professional if your symptoms do not start to get better or if they get worse. Visit your doctor or health care professional for regular checks on your progress. Your condition will be monitored carefully while you are receiving this medicine. This medicine may increase blood sugar. Ask your healthcare provider if changes in diet or medicines are needed if you have diabetes. You may need blood work done while you are taking this medicine. Women should inform their doctor if they wish to become pregnant or think they might be pregnant. There is a potential for serious side effects to an unborn child. Talk to your health care professional or pharmacist for more information. Do not breast-feed an infant while taking this medicine or for 6 months after stopping it. This medicine has caused ovarian failure in some women. This medicine may interfere with the ability to have a child. Talk with your doctor or health care professional if you are concerned about your fertility. What side effects may I notice from receiving this medicine? Side effects that you should report to your doctor or health care professional as soon as possible:  allergic reactions like skin rash, itching or hives, swelling of the face, lips, or tongue  increased blood pressure  severe stomach pain  signs and symptoms of hgh blood sugar such as being more thirsty or hungry or having to urinate more than normal. You may also feel very tired or have blurry vision.  signs and symptoms of low blood   sugar such as feeling anxious; confusion; dizziness; increased hunger; unusually weak or tired; sweating; shakiness; cold; irritable; headache; blurred vision; fast heartbeat; loss of consciousness  unusually slow heartbeat Side effects that usually do not require medical  attention (report to your doctor or health care professional if they continue or are bothersome):  constipation  diarrhea  dizziness  headache  muscle pain  muscle spasms  nausea  pain, redness, or irritation at site where injected This list may not describe all possible side effects. Call your doctor for medical advice about side effects. You may report side effects to FDA at 1-800-FDA-1088. Where should I keep my medicine? This drug is given in a hospital or clinic and will not be stored at home. NOTE: This sheet is a summary. It may not cover all possible information. If you have questions about this medicine, talk to your doctor, pharmacist, or health care provider.  2021 Elsevier/Gold Standard (2018-05-27 09:13:08)  

## 2021-02-07 NOTE — Progress Notes (Signed)
[place]   Telephone:([phone]) [phone] Fax:(336) [phone]   Clinic Follow up Note   Patient Care Team: Alroy Dust, L.Marlou Sa, MD as PCP - General (Family Medicine) Burnell Blanks, MD as PCP - Cardiology (Cardiology) Jonnie Finner, RN as Oncology Nurse Navigator Truitt Merle, MD as Consulting Physician (Oncology) Alla Feeling, NP as Nurse Practitioner (Nurse Practitioner) Alexis Frock, MD as Consulting Physician (Urology) Tyler Pita, MD as Consulting Physician (Radiation Oncology)  Date of Service:  02/07/2021  CHIEF COMPLAINT: f/u of metastatic neuroendocrine tumor  SUMMARY OF ONCOLOGIC HISTORY: Oncology History Overview Note  Cancer Staging No matching staging information was found for the patient.    Malignant neoplasm of prostate ([place])  08/31/2017 Initial Diagnosis   Malignant neoplasm of prostate ([place])    10/28/2017 Genetic Testing   The patient had genetic testing due to a personal history of prostate cancer and a family history of breast and stomach cancer.  The Common Hereditary Cancers Panel + Prostate Cancer Panel was ordered.  APC, ATM, AXIN2, BARD1, BMPR1A, BRCA1, BRCA2, BRIP1, CDH1, CDK4, CDKN2A (p14ARF), CDKN2A (p16INK4a), CHEK2, CTNNA1, DICER1, EPCAM*, FANCA, GREM1*, KIT, MEN1, MLH1, MSH2, MSH3, MSH6, MUTYH, NBN, NF1, PALB2, PDGFRA, PMS2, POLD1, POLE, PTEN, RAD50, RAD51C, RAD51D, SDHB, SDHC, SDHD, SMAD4, SMARCA4, STK11, TP53, TSC1, TSC2, VHL. The following genes were evaluated for sequence changes only: HOXB13*, NTHL1*, SDHA.   Results: Negative, no pathogenic variants identified. The date of this test report is 10/28/2017.     01/05/2018 - 02/12/2018 Radiation Therapy   S/p radiation to the prostate 70 Gy in 28 fractions from 01/05/2018-02/12/2018 per Dr. Tammi Klippel   01/31/2021 Imaging   CT A/P  IMPRESSION: 1. Interval decrease in size of hypodense lesions of the inferior right lobe of the liver and posterior liver dome findings  are consistent with treatment response of metastatic lesions. 2. No evidence of new metastatic disease in the abdomen or pelvis. 3. Prostatomegaly.   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  Upper normal 9 mm short axis lymph node in the adjacent mesentery 2.  1.6 x 1.4 cm heterogeneous, poorly defined lesion in the inferior right liver suspicious for metastatic disease, the differential includes GI primary or metastatic prostate cancer No retroperitoneal or pelvic sidewall lymphadenopathy   04/01/2020 PET scan   IMPRESSION: 1. Central mesenteric mass with intense radiotracer activity consistent well differentiated neuroendocrine tumor 2. 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 3. Small peritoneal/nodal metastasis along the LEFT iliac vessels. 4. Very small lesion in the pancreas with intermediate activity could represent primary lesion. No primary bowel lesion identified. 5. Small metastatic implant within the pericardium adjacent LEFT heart ventricle. 6. No skeletal metastasis.   04/30/2020 Pathology Results   FINAL MICROSCOPIC DIAGNOSIS:  A. 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  Tumor cells are negative  for TTF-1.  The findings are consistent with neuroendocrine tumor of  gastrointestinal origin.  Ki-67 stain shows a proliferative index of  about 1%, consistent with well-differentiated, grade 1 tumor.    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 Held after cycle 1 due to suspected pericarditis and deconditioning.           --Changed to monthly Lanreotide on 10/18/20.     01/31/2021 Imaging   CT A/P  IMPRESSION: 1. Interval decrease in size of hypodense lesions of the inferior right lobe of the liver and posterior liver dome findings are consistent with treatment response of metastatic lesions. 2. No evidence of new metastatic disease in the abdomen or pelvis. 3. Prostatomegaly.      CURRENT THERAPY:  First-line octreotide (Sandostatin) monthly starting 05/10/20, held after cycle 1. Changed to monthly Lanreotide on 10/18/20.   INTERVAL HISTORY:  Ryan Wilkerson is here for a follow up of metastatic neuroendocrine tumor. He was last seen by me on 11/15/20. She presents to the clinic alone. He denies constipation, as long as he takes the Miralax. He denies any other issues since his last injection. He does note occasional pelvic pain, below his belly button, which lasts for about 10 minutes. He notes this does occur prior to bowel movements.  All other systems were reviewed with the patient and are negative.  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 [facility])    Nocturia    Pericardial effusion    Pericarditis    Prostate cancer [facility]) urologist-- dr Tresa Moore /  oncologist-- dr manning   prostate bx 06-24-2016 and 09-15-2017 at dr Tresa Moore office  Stage T1c, Gleason 4+3, PSA 8.48, vol 33cc---  planned external beam radiation therpay   Tobacco abuse     Urgency of urination    Wears glasses     SURGICAL HISTORY: Past Surgical History:  Procedure Laterality Date   COLONOSCOPY  last one 06/ 2018   GOLD SEED IMPLANT N/A 12/23/2017   Procedure: GOLD SEED IMPLANT;  Surgeon: Alexis Frock, MD;  Location: [facility];  Service: Urology;  Laterality: N/A;   LEFT HEART CATH AND CORONARY ANGIOGRAPHY N/A 05/12/2020   Procedure: LEFT HEART CATH AND CORONARY ANGIOGRAPHY;  Surgeon: Burnell Blanks, MD;  Location: [place] CV LAB;  Service: Cardiovascular;  Laterality: N/A;   PROSTATE BIOPSY  06-24-2016;  09-15-2017-- at dr Tresa Moore office   [place] N/A 12/23/2017   Procedure: [place];  Surgeon: Alexis Frock, MD;  Location: [facility];  Service: Urology;  Laterality: N/A;    I have reviewed the social history and family history with the patient and they are unchanged from previous note.  ALLERGIES:  is allergic to aspirin, primidone, and simvastatin.  MEDICATIONS:  Current Outpatient Medications  Medication Sig Dispense Refill   acetaminophen (TYLENOL) 500 MG tablet Take 1,000 mg by mouth every 6 (six) hours as needed for headache (pain).      clopidogrel (PLAVIX) 75 MG tablet Take 75 mg by mouth daily.      fluticasone (FLONASE) 50 MCG/ACT nasal spray Place 2 sprays into both nostrils daily. 16 g 0   lisinopril-hydrochlorothiazide (PRINZIDE,ZESTORETIC) 20-12.5  MG tablet Take 0.5 tablets by mouth every morning.      loratadine (CLARITIN) 10 MG tablet Take 10 mg by mouth every morning.      OVER THE COUNTER MEDICATION Place 1 drop into both eyes daily as needed (dry eyes). Over the counter lubricating eye drop      pantoprazole (PROTONIX) 40 MG tablet Take 1 tablet (40 mg total) by mouth daily. 30 tablet 0   polyethylene glycol (MIRALAX / GLYCOLAX) 17 g packet Take 17 g by mouth 2 (two) times daily. 14 each 0   pravastatin (PRAVACHOL) 40 MG tablet Take 40 mg by mouth every  morning.      SYMBICORT 160-4.5 MCG/ACT inhaler Inhale 1-2 puffs into the lungs as needed.     traMADol (ULTRAM) 50 MG tablet Take 50-100 mg by mouth every 6 (six) hours as needed for moderate pain or severe pain.      No current facility-administered medications for this visit.    PHYSICAL EXAMINATION: ECOG PERFORMANCE STATUS: 1 - Symptomatic but completely ambulatory  Vitals:   02/07/21 1415  BP: 132/62  Pulse: 88  Resp: 17  Temp: 97.6 F (36.4 C)  SpO2: 98%   Filed Weights   02/07/21 1415  Weight: 148 lb 3.2 oz (67.2 kg)    Due to COVID19 we will limit examination to appearance. Patient had no complaints.  GENERAL:alert, no distress and comfortable SKIN: skin color normal, no rashes or significant lesions EYES: normal, Conjunctiva are pink and non-injected, sclera clear  NEURO: alert & oriented x 3 with fluent speech  LABORATORY DATA:  I have reviewed the data as listed CBC Latest Ref Rng & Units 01/10/2021 11/24/2020 11/23/2020  WBC 4.0 - 10.5 K/uL 4.9 10.2 7.3  Hemoglobin 13.0 - 17.0 g/dL 11.2(L) 11.7(L) 11.2(L)  Hematocrit 39.0 - 52.0 % 33.9(L) 35.3(L) 32.7(L)  Platelets 150 - 400 K/uL 212 193 195     CMP Latest Ref Rng & Units 01/10/2021 11/24/2020 11/23/2020  Glucose 70 - 99 mg/dL 138(H) 104(H) 109(H)  BUN 8 - 23 mg/dL 8 8 8   Creatinine 0.61 - 1.24 mg/dL 1.11 1.14 1.19  Sodium 135 - 145 mmol/L 140 135 138  Potassium 3.5 - 5.1 mmol/L 3.6 3.8 3.4(L)  Chloride 98 - 111 mmol/L 107 102 103  CO2 22 - 32 mmol/L 26 27 26   Calcium 8.9 - 10.3 mg/dL 8.7(L) 8.5(L) 8.6(L)  Total Protein 6.5 - 8.1 g/dL 6.2(L) - 5.7(L)  Total Bilirubin 0.3 - 1.2 mg/dL 0.7 - 1.0  Alkaline Phos 38 - 126 U/L 52 - 35(L)  AST 15 - 41 U/L 20 - 21  ALT 0 - 44 U/L 18 - 17      RADIOGRAPHIC STUDIES: I have personally reviewed the radiological images as listed and agreed with the findings in the report. No results found.   ASSESSMENT & PLAN:  ADREN DOLLINS is a 75 y.o. male with   1.   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 We do not have previous CT to compare. PET dotatate on 04/09/2020 showed intense uptake in the central mesenteric mass with 3 well differentiated liver metastases and small peritoneal/nodal metastatic deposits along the left iliac vessels. There is no primary bowel lesion identified. There is a very small lesion in the pancreas with indeterminate activity.  The primary site was still undetermined -His 04/30/20 liver  biopsy showed metastatic well differentiated/G1 neuroendocrine tumor to the liver.  IHC stains positive for synaptophysin, chromogranin, CD56 and CDX2 and negative for TTF-1-1.  Overall consistent with a GI primary.  Ki-67 of 1%.  -Outpatient MRI of the abdomen ruled out pancreas as the primary site on 05/16/20 -His case was discussed in tumor board, unfortunately this is metastatic disease and surgical resection is not an option.   -He began first line Sandostatin 20 mg on 05/10/20. However on 05/12/20 he had pericarditis. Treatment has been held since. I changed him to Lanreotide 161m every 4 weeks starting 10/18/20 -Most recent CT A/P on 01/31/21 showed partial response to treatment with decreased liver lesions, and no new metastatic disease. I reviewed this with him today. -Lab reviewed, adequate for treatment, will proceed to second dose lanreotide today and continue every 4-week -Follow-up in 3 months with lab. Will order next CT scan at that visit.    2.  History of pericarditis -Occurred 2 days after first octreotide on 05/10/20.  -ED 05/12/2020 found to have inferior STE, echo showed preserved LV function and small pericardial effusion. CTA negative for aortic dissection. Urgent cath showed normal coronaries -He has recovered well now    3. 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 MTammi Klippel-PSA down to 1.8 on 08/2018, steady rise to 4.69 on 6/21 -CT/bone scan in 6/21 showed no obvious metastatic disease from prostate cancer but did reveal mesenteric mass and liver lesion, ultimately biopsy-proven neuroendocrine tumor -CT A/P on 01/31/21 showed only prostatomegaly. -[facility] most recently seen 12/2020. He is under active surveillance.   4. Family history/genetics -He has personal and strong family history of cancers, including 7 brothers with prostate cancer, 1 of those has lung cancer also, a sister with lung cancer and another sister with breast cancer -His 10/28/17 genetic testing was negative for pathogenic mutation or VUS.   5. 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   No orders of the defined types were placed in this encounter.  All questions were answered. The patient knows to call the clinic with any problems, questions or concerns. No barriers to learning was detected. The total time spent in the appointment was 30 minutes.     YTruitt Merle MD 02/07/2021   I, KWilburn Mylar am acting as scribe for YTruitt Merle MD.   I have reviewed the above documentation for accuracy and completeness, and I agree with the above.

## 2021-03-07 ENCOUNTER — Inpatient Hospital Stay: Payer: Medicare HMO | Attending: Nurse Practitioner

## 2021-03-07 ENCOUNTER — Other Ambulatory Visit: Payer: Self-pay

## 2021-03-07 VITALS — BP 142/56 | HR 81 | Temp 98.8°F | Resp 18

## 2021-03-07 DIAGNOSIS — C7B02 Secondary carcinoid tumors of liver: Secondary | ICD-10-CM | POA: Diagnosis not present

## 2021-03-07 DIAGNOSIS — C7A00 Malignant carcinoid tumor of unspecified site: Secondary | ICD-10-CM | POA: Diagnosis not present

## 2021-03-07 DIAGNOSIS — C7B8 Other secondary neuroendocrine tumors: Secondary | ICD-10-CM

## 2021-03-07 DIAGNOSIS — Z7189 Other specified counseling: Secondary | ICD-10-CM

## 2021-03-07 MED ORDER — LANREOTIDE ACETATE 120 MG/0.5ML ~~LOC~~ SOLN
120.0000 mg | Freq: Once | SUBCUTANEOUS | Status: AC
  Administered 2021-03-07: 120 mg via SUBCUTANEOUS
  Filled 2021-03-07: qty 120

## 2021-03-07 NOTE — Patient Instructions (Signed)
Lanreotide injection What is this medication? LANREOTIDE (lan REE oh tide) is used to reduce blood levels of growth hormone in patients with a condition called acromegaly. It also works to slow or stop tumor growth in patients with neuroendocrine tumors and treat carcinoid syndrome. This medicine may be used for other purposes; ask your health care provider or pharmacist if you have questions. COMMON BRAND NAME(S): Somatuline [place] What should I tell my care team before I take this medication? They need to know if you have any of these conditions: diabetes gallbladder disease heart disease kidney disease liver disease thyroid disease an unusual or allergic reaction to lanreotide, other medicines, foods, dyes, or preservatives pregnant or trying to get pregnant breast-feeding How should I use this medication? This medicine is for injection under the skin. It is given by a health care professional in a hospital or clinic setting. Contact your pediatrician or health care professional regarding the use of this medicine in children. Special care may be needed. Overdosage: If you think you have taken too much of this medicine contact a poison control center or emergency room at once. NOTE: This medicine is only for you. Do not share this medicine with others. What if I miss a dose? It is important not to miss your dose. Call your doctor or health care professional if you are unable to keep an appointment. What may interact with this medication? This medicine may interact with the following medications: bromocriptine cyclosporine certain medicines for blood pressure, heart disease, irregular heart beat certain medicines for diabetes quinidine terfenadine This list may not describe all possible interactions. Give your health care provider a list of all the medicines, herbs, non-prescription drugs, or dietary supplements you use. Also tell them if you smoke, drink alcohol, or use illegal drugs.  Some items may interact with your medicine. What should I watch for while using this medication? Tell your doctor or healthcare professional if your symptoms do not start to get better or if they get worse. Visit your doctor or health care professional for regular checks on your progress. Your condition will be monitored carefully while you are receiving this medicine. This medicine may increase blood sugar. Ask your healthcare provider if changes in diet or medicines are needed if you have diabetes. You may need blood work done while you are taking this medicine. Women should inform their doctor if they wish to become pregnant or think they might be pregnant. There is a potential for serious side effects to an unborn child. Talk to your health care professional or pharmacist for more information. Do not breast-feed an infant while taking this medicine or for 6 months after stopping it. This medicine has caused ovarian failure in some women. This medicine may interfere with the ability to have a child. Talk with your doctor or health care professional if you are concerned about your fertility. What side effects may I notice from receiving this medication? Side effects that you should report to your doctor or health care professional as soon as possible: allergic reactions like skin rash, itching or hives, swelling of the face, lips, or tongue increased blood pressure severe stomach pain signs and symptoms of hgh blood sugar such as being more thirsty or hungry or having to urinate more than normal. You may also feel very tired or have blurry vision. signs and symptoms of low blood sugar such as feeling anxious; confusion; dizziness; increased hunger; unusually weak or tired; sweating; shakiness; cold; irritable; headache; blurred vision; fast   heartbeat; loss of consciousness unusually slow heartbeat Side effects that usually do not require medical attention (report to your doctor or health care  professional if they continue or are bothersome): constipation diarrhea dizziness headache muscle pain muscle spasms nausea pain, redness, or irritation at site where injected This list may not describe all possible side effects. Call your doctor for medical advice about side effects. You may report side effects to FDA at 1-800-FDA-1088. Where should I keep my medication? This drug is given in a hospital or clinic and will not be stored at home. NOTE: This sheet is a summary. It may not cover all possible information. If you have questions about this medicine, talk to your doctor, pharmacist, or health care provider.  2022 Elsevier/Gold Standard (2018-05-27 09:13:08)  

## 2021-03-11 DIAGNOSIS — R1084 Generalized abdominal pain: Secondary | ICD-10-CM | POA: Diagnosis not present

## 2021-03-11 DIAGNOSIS — R6881 Early satiety: Secondary | ICD-10-CM | POA: Diagnosis not present

## 2021-03-11 DIAGNOSIS — R634 Abnormal weight loss: Secondary | ICD-10-CM | POA: Diagnosis not present

## 2021-03-13 DIAGNOSIS — R6881 Early satiety: Secondary | ICD-10-CM | POA: Diagnosis not present

## 2021-03-13 DIAGNOSIS — R634 Abnormal weight loss: Secondary | ICD-10-CM | POA: Diagnosis not present

## 2021-03-13 DIAGNOSIS — R1084 Generalized abdominal pain: Secondary | ICD-10-CM | POA: Diagnosis not present

## 2021-04-04 ENCOUNTER — Inpatient Hospital Stay: Payer: Medicare HMO | Attending: Nurse Practitioner

## 2021-04-04 ENCOUNTER — Other Ambulatory Visit: Payer: Self-pay

## 2021-04-04 VITALS — BP 144/103 | HR 75 | Temp 99.4°F | Resp 14

## 2021-04-04 DIAGNOSIS — C7B8 Other secondary neuroendocrine tumors: Secondary | ICD-10-CM

## 2021-04-04 DIAGNOSIS — Z7189 Other specified counseling: Secondary | ICD-10-CM

## 2021-04-04 DIAGNOSIS — C7A00 Malignant carcinoid tumor of unspecified site: Secondary | ICD-10-CM | POA: Insufficient documentation

## 2021-04-04 DIAGNOSIS — C7B02 Secondary carcinoid tumors of liver: Secondary | ICD-10-CM | POA: Insufficient documentation

## 2021-04-04 MED ORDER — LANREOTIDE ACETATE 120 MG/0.5ML ~~LOC~~ SOLN
120.0000 mg | Freq: Once | SUBCUTANEOUS | Status: AC
  Administered 2021-04-04: 120 mg via SUBCUTANEOUS
  Filled 2021-04-04: qty 120

## 2021-04-04 NOTE — Patient Instructions (Signed)
Lanreotide injection What is this medication? LANREOTIDE (lan REE oh tide) is used to reduce blood levels of growth hormone in patients with a condition called acromegaly. It also works to slow or stop tumor growth in patients with neuroendocrine tumors and treat carcinoid syndrome. This medicine may be used for other purposes; ask your health care provider or pharmacist if you have questions. COMMON BRAND NAME(S): Somatuline [place] What should I tell my care team before I take this medication? They need to know if you have any of these conditions: diabetes gallbladder disease heart disease kidney disease liver disease thyroid disease an unusual or allergic reaction to lanreotide, other medicines, foods, dyes, or preservatives pregnant or trying to get pregnant breast-feeding How should I use this medication? This medicine is for injection under the skin. It is given by a health care professional in a hospital or clinic setting. Contact your pediatrician or health care professional regarding the use of this medicine in children. Special care may be needed. Overdosage: If you think you have taken too much of this medicine contact a poison control center or emergency room at once. NOTE: This medicine is only for you. Do not share this medicine with others. What if I miss a dose? It is important not to miss your dose. Call your doctor or health care professional if you are unable to keep an appointment. What may interact with this medication? This medicine may interact with the following medications: bromocriptine cyclosporine certain medicines for blood pressure, heart disease, irregular heart beat certain medicines for diabetes quinidine terfenadine This list may not describe all possible interactions. Give your health care provider a list of all the medicines, herbs, non-prescription drugs, or dietary supplements you use. Also tell them if you smoke, drink alcohol, or use illegal drugs.  Some items may interact with your medicine. What should I watch for while using this medication? Tell your doctor or healthcare professional if your symptoms do not start to get better or if they get worse. Visit your doctor or health care professional for regular checks on your progress. Your condition will be monitored carefully while you are receiving this medicine. This medicine may increase blood sugar. Ask your healthcare provider if changes in diet or medicines are needed if you have diabetes. You may need blood work done while you are taking this medicine. Women should inform their doctor if they wish to become pregnant or think they might be pregnant. There is a potential for serious side effects to an unborn child. Talk to your health care professional or pharmacist for more information. Do not breast-feed an infant while taking this medicine or for 6 months after stopping it. This medicine has caused ovarian failure in some women. This medicine may interfere with the ability to have a child. Talk with your doctor or health care professional if you are concerned about your fertility. What side effects may I notice from receiving this medication? Side effects that you should report to your doctor or health care professional as soon as possible: allergic reactions like skin rash, itching or hives, swelling of the face, lips, or tongue increased blood pressure severe stomach pain signs and symptoms of hgh blood sugar such as being more thirsty or hungry or having to urinate more than normal. You may also feel very tired or have blurry vision. signs and symptoms of low blood sugar such as feeling anxious; confusion; dizziness; increased hunger; unusually weak or tired; sweating; shakiness; cold; irritable; headache; blurred vision; fast   heartbeat; loss of consciousness unusually slow heartbeat Side effects that usually do not require medical attention (report to your doctor or health care  professional if they continue or are bothersome): constipation diarrhea dizziness headache muscle pain muscle spasms nausea pain, redness, or irritation at site where injected This list may not describe all possible side effects. Call your doctor for medical advice about side effects. You may report side effects to FDA at 1-800-FDA-1088. Where should I keep my medication? This drug is given in a hospital or clinic and will not be stored at home. NOTE: This sheet is a summary. It may not cover all possible information. If you have questions about this medicine, talk to your doctor, pharmacist, or health care provider.  2022 Elsevier/Gold Standard (2018-05-27 09:13:08)  

## 2021-05-01 ENCOUNTER — Other Ambulatory Visit: Payer: Self-pay

## 2021-05-01 DIAGNOSIS — C61 Malignant neoplasm of prostate: Secondary | ICD-10-CM

## 2021-05-02 ENCOUNTER — Inpatient Hospital Stay: Payer: Medicare HMO | Attending: Nurse Practitioner

## 2021-05-02 ENCOUNTER — Other Ambulatory Visit: Payer: Self-pay

## 2021-05-02 ENCOUNTER — Other Ambulatory Visit: Payer: Self-pay | Admitting: Hematology

## 2021-05-02 ENCOUNTER — Inpatient Hospital Stay (HOSPITAL_BASED_OUTPATIENT_CLINIC_OR_DEPARTMENT_OTHER): Payer: Medicare HMO | Admitting: Hematology

## 2021-05-02 ENCOUNTER — Encounter: Payer: Self-pay | Admitting: Hematology

## 2021-05-02 ENCOUNTER — Inpatient Hospital Stay: Payer: Medicare HMO

## 2021-05-02 VITALS — BP 141/79 | HR 77 | Temp 98.0°F | Resp 15

## 2021-05-02 VITALS — BP 146/85 | HR 75 | Temp 97.7°F | Resp 17 | Ht 68.0 in | Wt 151.0 lb

## 2021-05-02 DIAGNOSIS — Z923 Personal history of irradiation: Secondary | ICD-10-CM | POA: Insufficient documentation

## 2021-05-02 DIAGNOSIS — Z8546 Personal history of malignant neoplasm of prostate: Secondary | ICD-10-CM | POA: Diagnosis not present

## 2021-05-02 DIAGNOSIS — Z7951 Long term (current) use of inhaled steroids: Secondary | ICD-10-CM | POA: Insufficient documentation

## 2021-05-02 DIAGNOSIS — C7B02 Secondary carcinoid tumors of liver: Secondary | ICD-10-CM | POA: Insufficient documentation

## 2021-05-02 DIAGNOSIS — Z79899 Other long term (current) drug therapy: Secondary | ICD-10-CM | POA: Insufficient documentation

## 2021-05-02 DIAGNOSIS — I319 Disease of pericardium, unspecified: Secondary | ICD-10-CM | POA: Diagnosis not present

## 2021-05-02 DIAGNOSIS — Z7902 Long term (current) use of antithrombotics/antiplatelets: Secondary | ICD-10-CM | POA: Insufficient documentation

## 2021-05-02 DIAGNOSIS — Z7189 Other specified counseling: Secondary | ICD-10-CM

## 2021-05-02 DIAGNOSIS — I10 Essential (primary) hypertension: Secondary | ICD-10-CM | POA: Diagnosis not present

## 2021-05-02 DIAGNOSIS — C61 Malignant neoplasm of prostate: Secondary | ICD-10-CM

## 2021-05-02 DIAGNOSIS — C7B8 Other secondary neuroendocrine tumors: Secondary | ICD-10-CM

## 2021-05-02 DIAGNOSIS — C7A00 Malignant carcinoid tumor of unspecified site: Secondary | ICD-10-CM | POA: Insufficient documentation

## 2021-05-02 LAB — CMP (CANCER CENTER ONLY)
ALT: 31 U/L (ref 0–44)
AST: 25 U/L (ref 15–41)
Albumin: 3.9 g/dL (ref 3.5–5.0)
Alkaline Phosphatase: 69 U/L (ref 38–126)
Anion gap: 9 (ref 5–15)
BUN: 9 mg/dL (ref 8–23)
CO2: 28 mmol/L (ref 22–32)
Calcium: 9.2 mg/dL (ref 8.9–10.3)
Chloride: 104 mmol/L (ref 98–111)
Creatinine: 1.07 mg/dL (ref 0.61–1.24)
GFR, Estimated: >60 mL/min (ref >60)
Glucose, Bld: 73 mg/dL (ref 70–99)
Potassium: 3.8 mmol/L (ref 3.5–5.1)
Sodium: 141 mmol/L (ref 135–145)
Total Bilirubin: 0.6 mg/dL (ref 0.3–1.2)
Total Protein: 7.1 g/dL (ref 6.5–8.1)

## 2021-05-02 LAB — CBC WITH DIFFERENTIAL (CANCER CENTER ONLY)
Abs Immature Granulocytes: 0.01 10*3/uL (ref 0.00–0.07)
Basophils Absolute: 0 10*3/uL (ref 0.0–0.1)
Basophils Relative: 1 %
Eosinophils Absolute: 0.3 10*3/uL (ref 0.0–0.5)
Eosinophils Relative: 5 %
HCT: 38.6 % — ABNORMAL LOW (ref 39.0–52.0)
Hemoglobin: 12.6 g/dL — ABNORMAL LOW (ref 13.0–17.0)
Immature Granulocytes: 0 %
Lymphocytes Relative: 24 %
Lymphs Abs: 1.5 10*3/uL (ref 0.7–4.0)
MCH: 30 pg (ref 26.0–34.0)
MCHC: 32.6 g/dL (ref 30.0–36.0)
MCV: 91.9 fL (ref 80.0–100.0)
Monocytes Absolute: 0.6 10*3/uL (ref 0.1–1.0)
Monocytes Relative: 9 %
Neutro Abs: 3.7 10*3/uL (ref 1.7–7.7)
Neutrophils Relative %: 61 %
Platelet Count: 208 10*3/uL (ref 150–400)
RBC: 4.2 MIL/uL — ABNORMAL LOW (ref 4.22–5.81)
RDW: 11.6 % (ref 11.5–15.5)
WBC Count: 6 10*3/uL (ref 4.0–10.5)
nRBC: 0 % (ref 0.0–0.2)

## 2021-05-02 MED ORDER — LANREOTIDE ACETATE 120 MG/0.5ML ~~LOC~~ SOLN
120.0000 mg | Freq: Once | SUBCUTANEOUS | Status: AC
  Administered 2021-05-02: 120 mg via SUBCUTANEOUS
  Filled 2021-05-02: qty 120

## 2021-05-02 NOTE — Patient Instructions (Addendum)
Lanreotide injection What is this medication? LANREOTIDE (lan REE oh tide) is used to reduce blood levels of growth hormone in patients with a condition called acromegaly. It also works to slow or stop tumor growth in patients with neuroendocrine tumors and treat carcinoid syndrome. This medicine may be used for other purposes; ask your health care provider or pharmacist if you have questions. COMMON BRAND NAME(S): Somatuline Depot What should I tell my care team before I take this medication? They need to know if you have any of these conditions: diabetes gallbladder disease heart disease kidney disease liver disease thyroid disease an unusual or allergic reaction to lanreotide, other medicines, foods, dyes, or preservatives pregnant or trying to get pregnant breast-feeding How should I use this medication? This medicine is for injection under the skin. It is given by a health care professional in a hospital or clinic setting. Contact your pediatrician or health care professional regarding the use of this medicine in children. Special care may be needed. Overdosage: If you think you have taken too much of this medicine contact a poison control center or emergency room at once. NOTE: This medicine is only for you. Do not share this medicine with others. What if I miss a dose? It is important not to miss your dose. Call your doctor or health care professional if you are unable to keep an appointment. What may interact with this medication? This medicine may interact with the following medications: bromocriptine cyclosporine certain medicines for blood pressure, heart disease, irregular heart beat certain medicines for diabetes quinidine terfenadine This list may not describe all possible interactions. Give your health care provider a list of all the medicines, herbs, non-prescription drugs, or dietary supplements you use. Also tell them if you smoke, drink alcohol, or use illegal drugs.  Some items may interact with your medicine. What should I watch for while using this medication? Tell your doctor or healthcare professional if your symptoms do not start to get better or if they get worse. Visit your doctor or health care professional for regular checks on your progress. Your condition will be monitored carefully while you are receiving this medicine. This medicine may increase blood sugar. Ask your healthcare provider if changes in diet or medicines are needed if you have diabetes. You may need blood work done while you are taking this medicine. Women should inform their doctor if they wish to become pregnant or think they might be pregnant. There is a potential for serious side effects to an unborn child. Talk to your health care professional or pharmacist for more information. Do not breast-feed an infant while taking this medicine or for 6 months after stopping it. This medicine has caused ovarian failure in some women. This medicine may interfere with the ability to have a child. Talk with your doctor or health care professional if you are concerned about your fertility. What side effects may I notice from receiving this medication? Side effects that you should report to your doctor or health care professional as soon as possible: allergic reactions like skin rash, itching or hives, swelling of the face, lips, or tongue increased blood pressure severe stomach pain signs and symptoms of hgh blood sugar such as being more thirsty or hungry or having to urinate more than normal. You may also feel very tired or have blurry vision. signs and symptoms of low blood sugar such as feeling anxious; confusion; dizziness; increased hunger; unusually weak or tired; sweating; shakiness; cold; irritable; headache; blurred vision; fast   heartbeat; loss of consciousness unusually slow heartbeat Side effects that usually do not require medical attention (report to your doctor or health care  professional if they continue or are bothersome): constipation diarrhea dizziness headache muscle pain muscle spasms nausea pain, redness, or irritation at site where injected This list may not describe all possible side effects. Call your doctor for medical advice about side effects. You may report side effects to FDA at 1-800-FDA-1088. Where should I keep my medication? This drug is given in a hospital or clinic and will not be stored at home. NOTE: This sheet is a summary. It may not cover all possible information. If you have questions about this medicine, talk to your doctor, pharmacist, or health care provider.  2022 Elsevier/Gold Standard (2018-05-27 09:13:08) Lanreotide injection What is this medication? LANREOTIDE (lan REE oh tide) is used to reduce blood levels of growth hormone in patients with a condition called acromegaly. It also works to slow or stop tumor growth in patients with neuroendocrine tumors and treat carcinoid syndrome. This medicine may be used for other purposes; ask your health care provider or pharmacist if you have questions. COMMON BRAND NAME(S): Somatuline Depot What should I tell my care team before I take this medication? They need to know if you have any of these conditions: diabetes gallbladder disease heart disease kidney disease liver disease thyroid disease an unusual or allergic reaction to lanreotide, other medicines, foods, dyes, or preservatives pregnant or trying to get pregnant breast-feeding How should I use this medication? This medicine is for injection under the skin. It is given by a health care professional in a hospital or clinic setting. Contact your pediatrician or health care professional regarding the use of this medicine in children. Special care may be needed. Overdosage: If you think you have taken too much of this medicine contact a poison control center or emergency room at once. NOTE: This medicine is only for you. Do  not share this medicine with others. What if I miss a dose? It is important not to miss your dose. Call your doctor or health care professional if you are unable to keep an appointment. What may interact with this medication? This medicine may interact with the following medications: bromocriptine cyclosporine certain medicines for blood pressure, heart disease, irregular heart beat certain medicines for diabetes quinidine terfenadine This list may not describe all possible interactions. Give your health care provider a list of all the medicines, herbs, non-prescription drugs, or dietary supplements you use. Also tell them if you smoke, drink alcohol, or use illegal drugs. Some items may interact with your medicine. What should I watch for while using this medication? Tell your doctor or healthcare professional if your symptoms do not start to get better or if they get worse. Visit your doctor or health care professional for regular checks on your progress. Your condition will be monitored carefully while you are receiving this medicine. This medicine may increase blood sugar. Ask your healthcare provider if changes in diet or medicines are needed if you have diabetes. You may need blood work done while you are taking this medicine. Women should inform their doctor if they wish to become pregnant or think they might be pregnant. There is a potential for serious side effects to an unborn child. Talk to your health care professional or pharmacist for more information. Do not breast-feed an infant while taking this medicine or for 6 months after stopping it. This medicine has caused ovarian failure in some women.  This medicine may interfere with the ability to have a child. Talk with your doctor or health care professional if you are concerned about your fertility. What side effects may I notice from receiving this medication? Side effects that you should report to your doctor or health care  professional as soon as possible: allergic reactions like skin rash, itching or hives, swelling of the face, lips, or tongue increased blood pressure severe stomach pain signs and symptoms of hgh blood sugar such as being more thirsty or hungry or having to urinate more than normal. You may also feel very tired or have blurry vision. signs and symptoms of low blood sugar such as feeling anxious; confusion; dizziness; increased hunger; unusually weak or tired; sweating; shakiness; cold; irritable; headache; blurred vision; fast heartbeat; loss of consciousness unusually slow heartbeat Side effects that usually do not require medical attention (report to your doctor or health care professional if they continue or are bothersome): constipation diarrhea dizziness headache muscle pain muscle spasms nausea pain, redness, or irritation at site where injected This list may not describe all possible side effects. Call your doctor for medical advice about side effects. You may report side effects to FDA at 1-800-FDA-1088. Where should I keep my medication? This drug is given in a hospital or clinic and will not be stored at home. NOTE: This sheet is a summary. It may not cover all possible information. If you have questions about this medicine, talk to your doctor, pharmacist, or health care provider.  2022 Elsevier/Gold Standard (2018-05-27 09:13:08)

## 2021-05-02 NOTE — Progress Notes (Signed)
[name]   Telephone:(336) [phone] Fax:(336) [phone]   Clinic Follow up Note   Patient Care Team: Ryan Wilkerson, L.Ryan Sa, MD as PCP - General (Family Medicine) Ryan Blanks, MD as PCP - Cardiology (Cardiology) Ryan Finner, RN (Inactive) as Oncology Nurse Navigator Truitt Merle, MD as Consulting Physician (Oncology) Ryan Feeling, NP as Nurse Practitioner (Nurse Practitioner) Ryan Frock, MD as Consulting Physician (Urology) Ryan Pita, MD as Consulting Physician (Radiation Oncology)  Date of Service:  05/02/2021  CHIEF COMPLAINT: f/u of metastatic neuroendocrine tumor  CURRENT THERAPY:  First-line octreotide (Sandostatin) monthly starting 05/10/20, held after cycle 1. Changed to monthly Lanreotide on 10/18/20.   ASSESSMENT & PLAN:  Ryan Wilkerson is a 75 y.o. male with   1.  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 PET dotatate on 04/09/2020 showed intense uptake in the central mesenteric mass with 3 well differentiated liver metastases and small peritoneal/nodal metastatic deposits along the left iliac vessels. There is no primary bowel lesion identified. There is a very small lesion in the pancreas with indeterminate activity.  -His 04/30/20 liver biopsy showed metastatic well differentiated/G1 neuroendocrine tumor to the liver.  IHC stains positive for synaptophysin, chromogranin, CD56 and CDX2 and negative for TTF-1-1.  Overall consistent with a GI primary.  Ki-67 of 1%.  -abdomen MRI on 05/16/20 ruled out pancreas as the primary site -He began first line Sandostatin 20 mg on 05/10/20. However on 05/12/20 he had pericarditis and treatment was held. I changed him to Lanreotide 123m every 4 weeks starting 10/18/20 -Most recent CT A/P on 01/31/21 showed partial response to treatment with decreased  liver lesions, and no new metastatic disease.  -of note, [facility]. Recall due in 10 years. -He is clinically doing well. Lab reviewed, adequate for treatment, will proceed with lanreotide today and continue every 4-week -labs and f/u in 3 months, with CT CAP several days prior   2.  History of pericarditis -Occurred 2 days after first octreotide on 05/10/20.  -ED 05/12/2020 found to have inferior STE, echo showed preserved LV function and small pericardial effusion. CTA negative for aortic dissection. Urgent cath showed normal coronaries -He has recovered well now    3. 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 MTammi Wilkerson-PSA down to 1.8 on 08/2018, steady rise to 4.69 on 6/21 -CT/bone scan in 6/21 showed no obvious metastatic disease from prostate cancer -CT A/P on 01/31/21 showed only prostatomegaly. -[facility] most recently seen 12/2020. He is under active surveillance.   4. Family history/genetics -He has personal and strong family history of cancers, including 7 brothers with prostate cancer, 1 of those has lung cancer also, a sister with lung cancer and another sister with breast cancer -His 10/28/17 genetic testing was negative for pathogenic mutation or VUS.   5. 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   SUMMARY OF  ONCOLOGIC HISTORY: Oncology History Overview Note  Cancer Staging No matching staging information was found for the patient.    Malignant neoplasm of prostate ([place])  08/31/2017 Initial Diagnosis   Malignant neoplasm of prostate ([place])   10/28/2017 Genetic Testing   The patient had genetic testing due to a personal history of prostate cancer and a family history of breast and stomach cancer.  The Common Hereditary Cancers Panel + Prostate Cancer Panel was ordered.  APC, ATM, AXIN2, BARD1, BMPR1A, BRCA1, BRCA2, BRIP1, CDH1, CDK4, CDKN2A (p14ARF), CDKN2A (p16INK4a), CHEK2, CTNNA1, DICER1, EPCAM*, FANCA, GREM1*, KIT, MEN1, MLH1, MSH2, MSH3, MSH6, MUTYH, NBN, NF1, PALB2, PDGFRA, PMS2, POLD1, POLE, PTEN, RAD50, RAD51C, RAD51D, SDHB, SDHC, SDHD, SMAD4, SMARCA4, STK11, TP53, TSC1, TSC2, VHL. The following genes were evaluated for sequence changes only: HOXB13*, NTHL1*, SDHA.   Results: Negative, no pathogenic variants identified. The date of this test report is 10/28/2017.    01/05/2018 - 02/12/2018 Radiation Therapy   S/p radiation to the prostate 70 Gy in 28 fractions from 01/05/2018-02/12/2018 per Dr. Tammi Wilkerson   01/31/2021 Imaging   CT A/P  IMPRESSION: 1. Interval decrease in size of hypodense lesions of the inferior right lobe of the liver and posterior liver dome findings are consistent with treatment response of metastatic lesions. 2. No evidence of new metastatic disease in the abdomen or pelvis. 3. Prostatomegaly.   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  Upper normal 9 mm short axis lymph node in the adjacent mesentery 2.  1.6 x 1.4 cm heterogeneous, poorly defined lesion in the inferior right liver suspicious for metastatic  disease, the differential includes GI primary or metastatic prostate cancer No retroperitoneal or pelvic sidewall lymphadenopathy   04/01/2020 PET scan   IMPRESSION: 1. Central mesenteric mass with intense radiotracer activity consistent well differentiated neuroendocrine tumor 2. 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 3. Small peritoneal/nodal metastasis along the LEFT iliac vessels. 4. Very small lesion in the pancreas with intermediate activity could represent primary lesion. No primary bowel lesion identified. 5. Small metastatic implant within the pericardium adjacent LEFT heart ventricle. 6. No skeletal metastasis.   04/30/2020 Pathology Results   FINAL MICROSCOPIC DIAGNOSIS:  A. 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  Tumor cells are negative  for TTF-1.  The findings are consistent with neuroendocrine tumor of  gastrointestinal origin.  Ki-67 stain shows a proliferative index of  about 1%, consistent with well-differentiated, grade 1 tumor.    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 Held after cycle 1 due to suspected pericarditis and deconditioning.           --Changed to monthly Lanreotide on 10/18/20.     01/31/2021 Imaging   CT A/P  IMPRESSION: 1. Interval decrease in size of hypodense lesions of the inferior right lobe of the liver and posterior liver dome findings are consistent with treatment response of metastatic  lesions. 2. No evidence of new metastatic disease in the abdomen or pelvis. 3. Prostatomegaly.      INTERVAL HISTORY:  Ryan Wilkerson is here for a follow up of metastatic neuroendocrine tumor. He was last  seen by me on 02/07/21. He presents to the clinic alone. He reports continued constipation, for which he manages with Miralax. He reports he is eating well. He reports his energy level is "not that good." He notes he still does everything he wants to just less than he used to. He reports some dizzy spells when he stands up. He endorses drinking lots of water. His BP is normal.   All other systems were reviewed with the patient and are negative.  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 [facility])    Nocturia    Pericardial effusion    Pericarditis    Prostate cancer [facility]) urologist-- dr Ryan Wilkerson /  oncologist-- dr manning   prostate bx 06-24-2016 and 09-15-2017 at dr Ryan Wilkerson office  Stage T1c, Gleason 4+3, PSA 8.48, vol 33cc---  planned external beam radiation therpay   Tobacco abuse    Urgency of urination    Wears glasses     SURGICAL HISTORY: Past Surgical History:  Procedure Laterality Date   COLONOSCOPY  last one 06/ 2018   GOLD SEED IMPLANT N/A 12/23/2017   Procedure: GOLD SEED IMPLANT;  Surgeon: Ryan Frock, MD;  Location: [facility];  Service: Urology;  Laterality: N/A;   LEFT HEART CATH AND CORONARY ANGIOGRAPHY N/A 05/12/2020   Procedure: LEFT HEART CATH AND CORONARY ANGIOGRAPHY;  Surgeon: Ryan Blanks, MD;  Location: [facility];  Service: Cardiovascular;  Laterality: N/A;   PROSTATE BIOPSY  06-24-2016;  09-15-2017-- at dr Ryan Wilkerson office   [place] N/A 12/23/2017   Procedure: [place];  Surgeon: Ryan Frock, MD;  Location: [facility];  Service: Urology;  Laterality: N/A;    I have reviewed the social history and family history with the patient and they are unchanged from previous note.  ALLERGIES:  is allergic to aspirin, primidone, and simvastatin.  MEDICATIONS:  Current Outpatient Medications  Medication Sig Dispense Refill   acetaminophen (TYLENOL) 500 MG tablet Take 1,000 mg by mouth every 6 (six) hours as needed for headache (pain).      clopidogrel (PLAVIX) 75 MG tablet Take 75 mg by mouth daily.      fluticasone (FLONASE) 50 MCG/ACT nasal spray Place 2 sprays into both nostrils daily. 16 g 0   lisinopril-hydrochlorothiazide (PRINZIDE,ZESTORETIC) 20-12.5 MG tablet Take 0.5 tablets by mouth every morning.      loratadine (CLARITIN) 10 MG tablet Take 10 mg by mouth every morning.      OVER THE COUNTER MEDICATION Place 1 drop into both eyes daily as needed (dry eyes). Over the counter lubricating eye drop      pantoprazole (PROTONIX) 40 MG tablet Take 1 tablet (40 mg total) by mouth daily. 30 tablet 0   polyethylene glycol (MIRALAX /  GLYCOLAX) 17 g packet Take 17 g by mouth 2 (two) times daily. 14 each 0   pravastatin (PRAVACHOL) 40 MG tablet Take 40 mg by mouth every morning.      SYMBICORT 160-4.5 MCG/ACT inhaler Inhale 1-2 puffs into the lungs as needed.     traMADol (ULTRAM) 50 MG tablet Take 50-100 mg by mouth every 6 (six) hours as needed for moderate pain or severe pain.      No current facility-administered medications for this visit.    PHYSICAL EXAMINATION: ECOG PERFORMANCE STATUS: 1 - Symptomatic but completely ambulatory  Vitals:   05/02/21 1443  BP: (!) 146/85  Pulse: 75  Resp: 17  Temp: 97.7 F (36.5 C)  SpO2: 100%   Wt Readings from Last 3 Encounters:  05/02/21 151 lb (68.5 kg)  02/07/21 148 lb 3.2 oz (67.2 kg)  11/23/20 149 lb 4 oz (67.7 kg)     GENERAL:alert, no distress and comfortable SKIN: skin color normal, no rashes or  significant lesions EYES: normal, Conjunctiva are pink and non-injected, sclera clear  NEURO: alert & oriented x 3 with fluent speech  LABORATORY DATA:  I have reviewed the data as listed CBC Latest Ref Rng & Units 05/02/2021 01/10/2021 11/24/2020  WBC 4.0 - 10.5 K/uL 6.0 4.9 10.2  Hemoglobin 13.0 - 17.0 g/dL 12.6(L) 11.2(L) 11.7(L)  Hematocrit 39.0 - 52.0 % 38.6(L) 33.9(L) 35.3(L)  Platelets 150 - 400 K/uL 208 212 193     CMP Latest Ref Rng & Units 05/02/2021 01/10/2021 11/24/2020  Glucose 70 - 99 mg/dL 73 138(H) 104(H)  BUN 8 - 23 mg/dL _0 Creatinine 0.61 - 1.24 mg/dL 1.07 1.11 1.14  Sodium 135 - 145 mmol/L 141 140 135  Potassium 3.5 - 5.1 mmol/L 3.8 3.6 3.8  Chloride 98 - 111 mmol/L 104 107 102  CO2 22 - 32 mmol/L _1 Calcium 8.9 - 10.3 mg/dL 9.2 8.7(L) 8.5(L)  Total Protein 6.5 - 8.1 g/dL 7.1 6.2(L) -  Total Bilirubin 0.3 - 1.2 mg/dL 0.6 0.7 -  Alkaline Phos 38 - 126 U/L 69 52 -  AST 15 - 41 U/L 25 20 -  ALT 0 - 44 U/L 31 18 -      RADIOGRAPHIC STUDIES: I have personally reviewed the radiological images as listed and agreed with the findings in the report. No results found.    Orders Placed This Encounter  Procedures   CT CHEST ABDOMEN PELVIS W CONTRAST    Standing Status:   Future    Standing Expiration Date:   05/02/2022    Order Specific Question:   If indicated for the ordered procedure, I authorize the administration of contrast media per Radiology protocol    Answer:   Yes    Order Specific Question:   Preferred imaging location?    Answer:   [facility]    Order Specific Question:   Release to patient    Answer:   Immediate    Order Specific Question:   Is Oral Contrast requested for this exam?    Answer:   Yes, Per Radiology protocol    Order Specific Question:   Reason for Exam (SYMPTOM  OR DIAGNOSIS REQUIRED)    Answer:   evaluate neuroendocrine tumor   All questions were answered. The patient knows to call the clinic with any problems,  questions or concerns. No barriers to learning was detected. The total time spent in the appointment was 30 minutes.  Truitt Merle, MD 05/02/2021   I, Wilburn Mylar, am acting as scribe for Truitt Merle, MD.   I have reviewed the above documentation for accuracy and completeness, and I agree with the above.

## 2021-05-03 ENCOUNTER — Telehealth: Payer: Self-pay | Admitting: Hematology

## 2021-05-03 LAB — CHROMOGRANIN A: Chromogranin A (ng/mL): 82.8 ng/mL (ref 0.0–101.8)

## 2021-05-03 NOTE — Telephone Encounter (Signed)
Scheduled follow-up appointments per 9/1 los. Patient is aware. 

## 2021-05-30 ENCOUNTER — Inpatient Hospital Stay: Payer: Medicare HMO

## 2021-05-30 ENCOUNTER — Other Ambulatory Visit: Payer: Self-pay

## 2021-05-30 ENCOUNTER — Other Ambulatory Visit: Payer: Self-pay | Admitting: *Deleted

## 2021-05-30 VITALS — BP 145/77 | HR 83 | Temp 98.3°F | Resp 18

## 2021-05-30 DIAGNOSIS — I319 Disease of pericardium, unspecified: Secondary | ICD-10-CM | POA: Diagnosis not present

## 2021-05-30 DIAGNOSIS — Z7189 Other specified counseling: Secondary | ICD-10-CM

## 2021-05-30 DIAGNOSIS — Z7902 Long term (current) use of antithrombotics/antiplatelets: Secondary | ICD-10-CM | POA: Diagnosis not present

## 2021-05-30 DIAGNOSIS — Z923 Personal history of irradiation: Secondary | ICD-10-CM | POA: Diagnosis not present

## 2021-05-30 DIAGNOSIS — Z8546 Personal history of malignant neoplasm of prostate: Secondary | ICD-10-CM | POA: Diagnosis not present

## 2021-05-30 DIAGNOSIS — C7A00 Malignant carcinoid tumor of unspecified site: Secondary | ICD-10-CM | POA: Diagnosis not present

## 2021-05-30 DIAGNOSIS — Z79899 Other long term (current) drug therapy: Secondary | ICD-10-CM | POA: Diagnosis not present

## 2021-05-30 DIAGNOSIS — Z7951 Long term (current) use of inhaled steroids: Secondary | ICD-10-CM | POA: Diagnosis not present

## 2021-05-30 DIAGNOSIS — I10 Essential (primary) hypertension: Secondary | ICD-10-CM | POA: Diagnosis not present

## 2021-05-30 DIAGNOSIS — C7B02 Secondary carcinoid tumors of liver: Secondary | ICD-10-CM | POA: Diagnosis not present

## 2021-05-30 DIAGNOSIS — C7B8 Other secondary neuroendocrine tumors: Secondary | ICD-10-CM

## 2021-05-30 MED ORDER — LANREOTIDE ACETATE 120 MG/0.5ML ~~LOC~~ SOLN
120.0000 mg | Freq: Once | SUBCUTANEOUS | Status: AC
  Administered 2021-05-30: 120 mg via SUBCUTANEOUS
  Filled 2021-05-30: qty 120

## 2021-05-30 NOTE — Patient Instructions (Signed)
Lanreotide injection What is this medication? LANREOTIDE (lan REE oh tide) is used to reduce blood levels of growth hormone in patients with a condition called acromegaly. It also works to slow or stop tumor growth in patients with neuroendocrine tumors and treat carcinoid syndrome. This medicine may be used for other purposes; ask your health care provider or pharmacist if you have questions. COMMON BRAND NAME(S): Somatuline [place] What should I tell my care team before I take this medication? They need to know if you have any of these conditions: diabetes gallbladder disease heart disease kidney disease liver disease thyroid disease an unusual or allergic reaction to lanreotide, other medicines, foods, dyes, or preservatives pregnant or trying to get pregnant breast-feeding How should I use this medication? This medicine is for injection under the skin. It is given by a health care professional in a hospital or clinic setting. Contact your pediatrician or health care professional regarding the use of this medicine in children. Special care may be needed. Overdosage: If you think you have taken too much of this medicine contact a poison control center or emergency room at once. NOTE: This medicine is only for you. Do not share this medicine with others. What if I miss a dose? It is important not to miss your dose. Call your doctor or health care professional if you are unable to keep an appointment. What may interact with this medication? This medicine may interact with the following medications: bromocriptine cyclosporine certain medicines for blood pressure, heart disease, irregular heart beat certain medicines for diabetes quinidine terfenadine This list may not describe all possible interactions. Give your health care provider a list of all the medicines, herbs, non-prescription drugs, or dietary supplements you use. Also tell them if you smoke, drink alcohol, or use illegal drugs.  Some items may interact with your medicine. What should I watch for while using this medication? Tell your doctor or healthcare professional if your symptoms do not start to get better or if they get worse. Visit your doctor or health care professional for regular checks on your progress. Your condition will be monitored carefully while you are receiving this medicine. This medicine may increase blood sugar. Ask your healthcare provider if changes in diet or medicines are needed if you have diabetes. You may need blood work done while you are taking this medicine. Women should inform their doctor if they wish to become pregnant or think they might be pregnant. There is a potential for serious side effects to an unborn child. Talk to your health care professional or pharmacist for more information. Do not breast-feed an infant while taking this medicine or for 6 months after stopping it. This medicine has caused ovarian failure in some women. This medicine may interfere with the ability to have a child. Talk with your doctor or health care professional if you are concerned about your fertility. What side effects may I notice from receiving this medication? Side effects that you should report to your doctor or health care professional as soon as possible: allergic reactions like skin rash, itching or hives, swelling of the face, lips, or tongue increased blood pressure severe stomach pain signs and symptoms of hgh blood sugar such as being more thirsty or hungry or having to urinate more than normal. You may also feel very tired or have blurry vision. signs and symptoms of low blood sugar such as feeling anxious; confusion; dizziness; increased hunger; unusually weak or tired; sweating; shakiness; cold; irritable; headache; blurred vision; fast   heartbeat; loss of consciousness unusually slow heartbeat Side effects that usually do not require medical attention (report to your doctor or health care  professional if they continue or are bothersome): constipation diarrhea dizziness headache muscle pain muscle spasms nausea pain, redness, or irritation at site where injected This list may not describe all possible side effects. Call your doctor for medical advice about side effects. You may report side effects to FDA at 1-800-FDA-1088. Where should I keep my medication? This drug is given in a hospital or clinic and will not be stored at home. NOTE: This sheet is a summary. It may not cover all possible information. If you have questions about this medicine, talk to your doctor, pharmacist, or health care provider.  2022 Elsevier/Gold Standard (2018-05-27 09:13:08)  

## 2021-06-10 LAB — 5 HIAA, QUANTITATIVE, URINE, 24 HOUR
5-HIAA, Ur: 3.3 mg/L
5-HIAA,Quant.,24 Hr Urine: 1.7 mg/24 hr (ref 0.0–14.9)
Total Volume: 500

## 2021-06-15 DIAGNOSIS — Z23 Encounter for immunization: Secondary | ICD-10-CM | POA: Diagnosis not present

## 2021-06-27 ENCOUNTER — Other Ambulatory Visit: Payer: Self-pay

## 2021-06-27 ENCOUNTER — Inpatient Hospital Stay: Payer: Medicare HMO | Attending: Nurse Practitioner

## 2021-06-27 VITALS — BP 164/86 | HR 72 | Temp 98.2°F | Resp 16

## 2021-06-27 DIAGNOSIS — C7A00 Malignant carcinoid tumor of unspecified site: Secondary | ICD-10-CM | POA: Diagnosis not present

## 2021-06-27 DIAGNOSIS — C7B02 Secondary carcinoid tumors of liver: Secondary | ICD-10-CM | POA: Diagnosis not present

## 2021-06-27 DIAGNOSIS — Z7189 Other specified counseling: Secondary | ICD-10-CM

## 2021-06-27 DIAGNOSIS — C7B8 Other secondary neuroendocrine tumors: Secondary | ICD-10-CM

## 2021-06-27 MED ORDER — LANREOTIDE ACETATE 120 MG/0.5ML ~~LOC~~ SOLN
120.0000 mg | Freq: Once | SUBCUTANEOUS | Status: AC
  Administered 2021-06-27: 120 mg via SUBCUTANEOUS
  Filled 2021-06-27: qty 120

## 2021-06-27 NOTE — Patient Instructions (Signed)
Lanreotide injection What is this medication? LANREOTIDE (lan REE oh tide) is used to reduce blood levels of growth hormone in patients with a condition called acromegaly. It also works to slow or stop tumor growth in patients with neuroendocrine tumors and treat carcinoid syndrome. This medicine may be used for other purposes; ask your health care provider or pharmacist if you have questions. COMMON BRAND NAME(S): Somatuline [place] What should I tell my care team before I take this medication? They need to know if you have any of these conditions: diabetes gallbladder disease heart disease kidney disease liver disease thyroid disease an unusual or allergic reaction to lanreotide, other medicines, foods, dyes, or preservatives pregnant or trying to get pregnant breast-feeding How should I use this medication? This medicine is for injection under the skin. It is given by a health care professional in a hospital or clinic setting. Contact your pediatrician or health care professional regarding the use of this medicine in children. Special care may be needed. Overdosage: If you think you have taken too much of this medicine contact a poison control center or emergency room at once. NOTE: This medicine is only for you. Do not share this medicine with others. What if I miss a dose? It is important not to miss your dose. Call your doctor or health care professional if you are unable to keep an appointment. What may interact with this medication? This medicine may interact with the following medications: bromocriptine cyclosporine certain medicines for blood pressure, heart disease, irregular heart beat certain medicines for diabetes quinidine terfenadine This list may not describe all possible interactions. Give your health care provider a list of all the medicines, herbs, non-prescription drugs, or dietary supplements you use. Also tell them if you smoke, drink alcohol, or use illegal drugs.  Some items may interact with your medicine. What should I watch for while using this medication? Tell your doctor or healthcare professional if your symptoms do not start to get better or if they get worse. Visit your doctor or health care professional for regular checks on your progress. Your condition will be monitored carefully while you are receiving this medicine. This medicine may increase blood sugar. Ask your healthcare provider if changes in diet or medicines are needed if you have diabetes. You may need blood work done while you are taking this medicine. Women should inform their doctor if they wish to become pregnant or think they might be pregnant. There is a potential for serious side effects to an unborn child. Talk to your health care professional or pharmacist for more information. Do not breast-feed an infant while taking this medicine or for 6 months after stopping it. This medicine has caused ovarian failure in some women. This medicine may interfere with the ability to have a child. Talk with your doctor or health care professional if you are concerned about your fertility. What side effects may I notice from receiving this medication? Side effects that you should report to your doctor or health care professional as soon as possible: allergic reactions like skin rash, itching or hives, swelling of the face, lips, or tongue increased blood pressure severe stomach pain signs and symptoms of hgh blood sugar such as being more thirsty or hungry or having to urinate more than normal. You may also feel very tired or have blurry vision. signs and symptoms of low blood sugar such as feeling anxious; confusion; dizziness; increased hunger; unusually weak or tired; sweating; shakiness; cold; irritable; headache; blurred vision; fast   heartbeat; loss of consciousness unusually slow heartbeat Side effects that usually do not require medical attention (report to your doctor or health care  professional if they continue or are bothersome): constipation diarrhea dizziness headache muscle pain muscle spasms nausea pain, redness, or irritation at site where injected This list may not describe all possible side effects. Call your doctor for medical advice about side effects. You may report side effects to FDA at 1-800-FDA-1088. Where should I keep my medication? This drug is given in a hospital or clinic and will not be stored at home. NOTE: This sheet is a summary. It may not cover all possible information. If you have questions about this medicine, talk to your doctor, pharmacist, or health care provider.  2022 Elsevier/Gold Standard (2018-05-27 09:13:08)  

## 2021-07-01 DIAGNOSIS — C61 Malignant neoplasm of prostate: Secondary | ICD-10-CM | POA: Diagnosis not present

## 2021-07-09 DIAGNOSIS — R3915 Urgency of urination: Secondary | ICD-10-CM | POA: Diagnosis not present

## 2021-07-09 DIAGNOSIS — C61 Malignant neoplasm of prostate: Secondary | ICD-10-CM | POA: Diagnosis not present

## 2021-07-18 ENCOUNTER — Telehealth: Payer: Self-pay

## 2021-07-18 NOTE — Telephone Encounter (Signed)
Spoke with Katharine Look w/[facility] regarding pt's Lanreotide injection refill.  [facility] contacted Dr. Ernestina Penna office on 07/17/2021 requesting a return telephone call for Rx refill at [phone].  Returned call today and Lanreotide is scheduled to be shipped the week of 07/21/2021.  Pt's appt for the Lanreotide is scheduled for 07/31/2021.

## 2021-07-26 ENCOUNTER — Other Ambulatory Visit: Payer: Self-pay

## 2021-07-26 DIAGNOSIS — C7B8 Other secondary neuroendocrine tumors: Secondary | ICD-10-CM

## 2021-07-26 NOTE — Progress Notes (Signed)
Cbc w/diff & CMP ordered.

## 2021-07-29 ENCOUNTER — Ambulatory Visit (HOSPITAL_COMMUNITY)
Admission: RE | Admit: 2021-07-29 | Discharge: 2021-07-29 | Disposition: A | Discharge status: Home / Self Care | Payer: Medicare HMO | Source: Ambulatory Visit | Attending: Hematology | Admitting: Hematology

## 2021-07-29 ENCOUNTER — Other Ambulatory Visit: Payer: Self-pay

## 2021-07-29 ENCOUNTER — Inpatient Hospital Stay: Payer: Medicare HMO | Attending: Nurse Practitioner

## 2021-07-29 DIAGNOSIS — C7A00 Malignant carcinoid tumor of unspecified site: Secondary | ICD-10-CM | POA: Insufficient documentation

## 2021-07-29 DIAGNOSIS — C7B8 Other secondary neuroendocrine tumors: Secondary | ICD-10-CM | POA: Insufficient documentation

## 2021-07-29 DIAGNOSIS — C7B02 Secondary carcinoid tumors of liver: Secondary | ICD-10-CM | POA: Insufficient documentation

## 2021-07-29 DIAGNOSIS — C61 Malignant neoplasm of prostate: Secondary | ICD-10-CM | POA: Diagnosis not present

## 2021-07-29 LAB — CBC WITH DIFFERENTIAL (CANCER CENTER ONLY)
Abs Immature Granulocytes: 0.01 10*3/uL (ref 0.00–0.07)
Basophils Absolute: 0 10*3/uL (ref 0.0–0.1)
Basophils Relative: 1 %
Eosinophils Absolute: 0.3 10*3/uL (ref 0.0–0.5)
Eosinophils Relative: 4 %
HCT: 38.7 % — ABNORMAL LOW (ref 39.0–52.0)
Hemoglobin: 12.8 g/dL — ABNORMAL LOW (ref 13.0–17.0)
Immature Granulocytes: 0 %
Lymphocytes Relative: 26 %
Lymphs Abs: 1.8 10*3/uL (ref 0.7–4.0)
MCH: 30.3 pg (ref 26.0–34.0)
MCHC: 33.1 g/dL (ref 30.0–36.0)
MCV: 91.5 fL (ref 80.0–100.0)
Monocytes Absolute: 0.6 10*3/uL (ref 0.1–1.0)
Monocytes Relative: 9 %
Neutro Abs: 4.1 10*3/uL (ref 1.7–7.7)
Neutrophils Relative %: 60 %
Platelet Count: 216 10*3/uL (ref 150–400)
RBC: 4.23 MIL/uL (ref 4.22–5.81)
RDW: 11.6 % (ref 11.5–15.5)
WBC Count: 6.8 10*3/uL (ref 4.0–10.5)
nRBC: 0 % (ref 0.0–0.2)

## 2021-07-29 LAB — CMP (CANCER CENTER ONLY)
ALT: 31 U/L (ref 0–44)
AST: 29 U/L (ref 15–41)
Albumin: 3.8 g/dL (ref 3.5–5.0)
Alkaline Phosphatase: 64 U/L (ref 38–126)
Anion gap: 8 (ref 5–15)
BUN: 14 mg/dL (ref 8–23)
CO2: 29 mmol/L (ref 22–32)
Calcium: 9 mg/dL (ref 8.9–10.3)
Chloride: 103 mmol/L (ref 98–111)
Creatinine: 1.22 mg/dL (ref 0.61–1.24)
GFR, Estimated: >60 mL/min (ref >60)
Glucose, Bld: 86 mg/dL (ref 70–99)
Potassium: 4.1 mmol/L (ref 3.5–5.1)
Sodium: 140 mmol/L (ref 135–145)
Total Bilirubin: 0.5 mg/dL (ref 0.3–1.2)
Total Protein: 7 g/dL (ref 6.5–8.1)

## 2021-07-29 IMAGING — CT CT CHEST-ABD-PELV W/ CM
2 of 5 series · 12 of 36 positions shown, 14 images · IV contrast (APPLIED)
Comparison: Abdomen/pelvis CT [DATE]

CLINICAL DATA: Prostate cancer restaging.

EXAM:
CT CHEST, ABDOMEN, AND PELVIS WITH CONTRAST
TECHNIQUE: Multidetector CT imaging of the chest, abdomen and pelvis was
performed following the standard protocol during bolus
administration of intravenous contrast.
CONTRAST:  80mL OMNIPAQUE IOHEXOL 350 MG/ML SOLN

[Series 2: cap with · axial · 0.79mm/px · z∈[-624,-89]mm · 9 of 135 slices shown, 11 images]
[im 14/135  mediastinal]
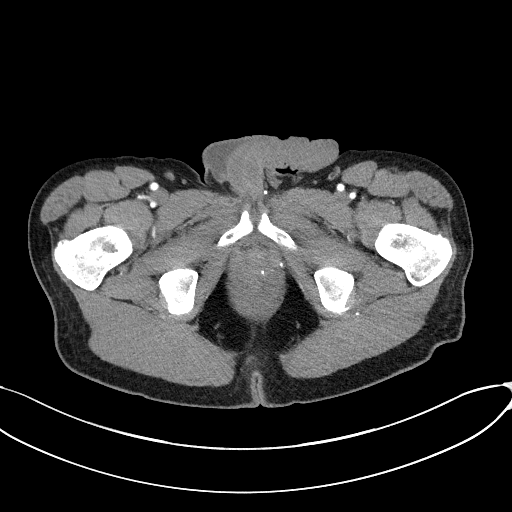
[im 14/135  bone]
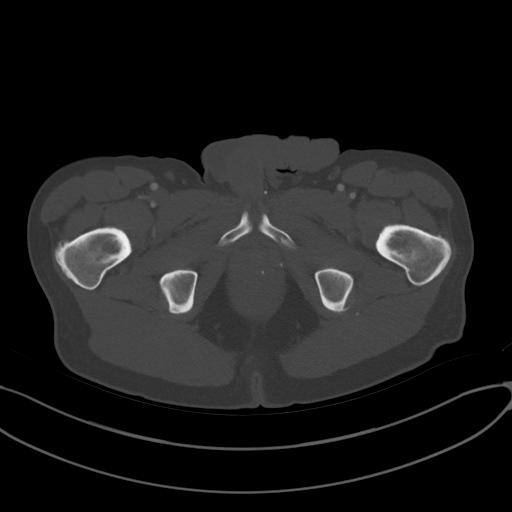
[im 27/135  mediastinal]
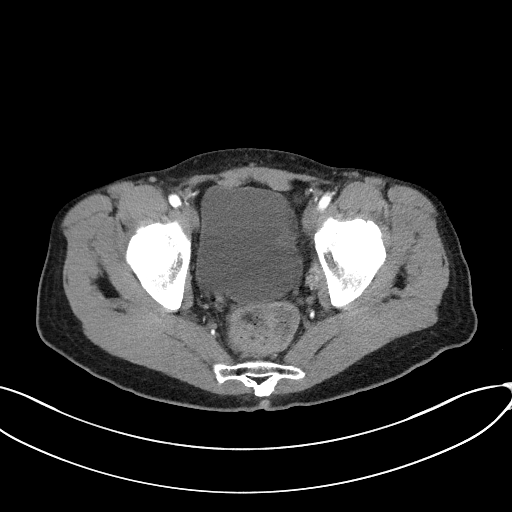
[im 41/135  mediastinal]
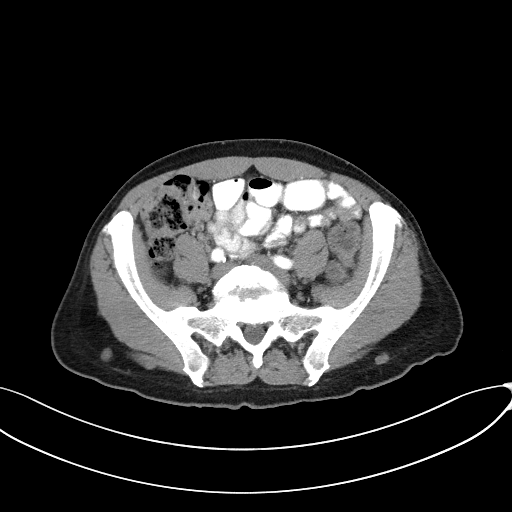
[im 54/135  mediastinal]
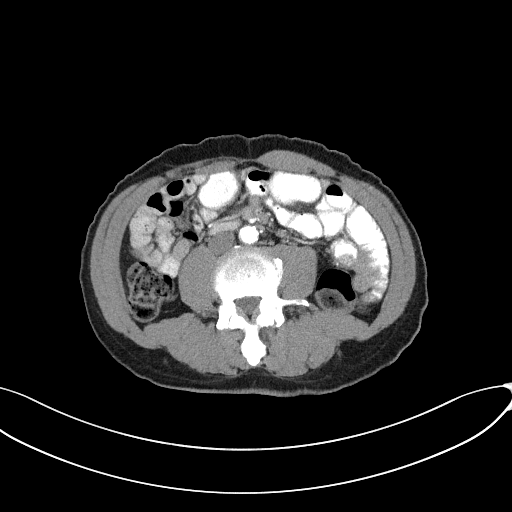
[im 68/135  mediastinal]
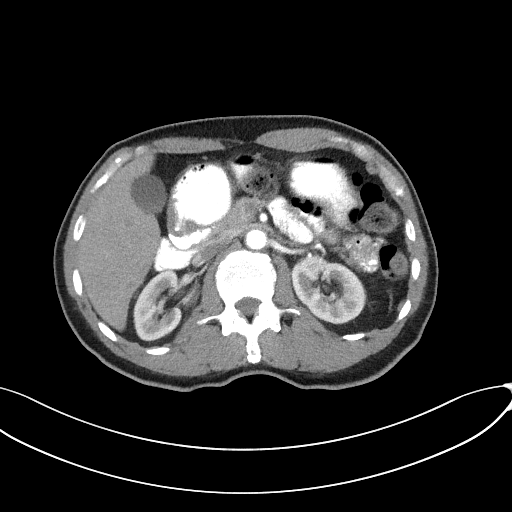
[im 81/135  mediastinal]
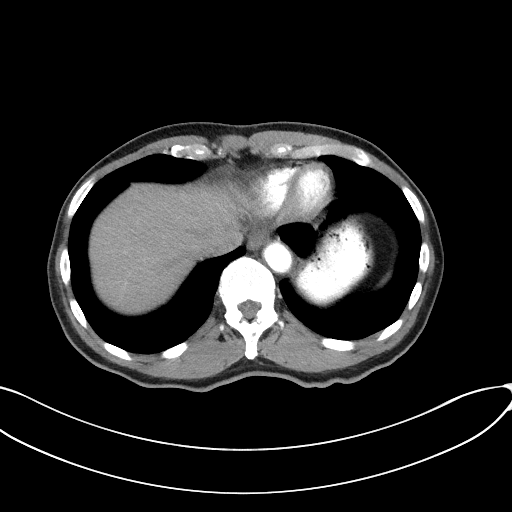
[im 94/135  mediastinal]
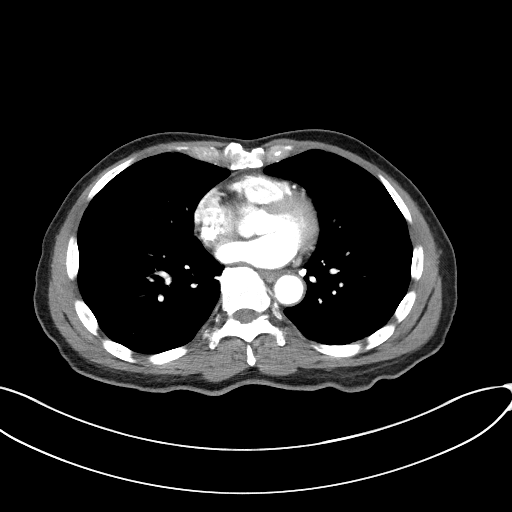
[im 108/135  mediastinal]
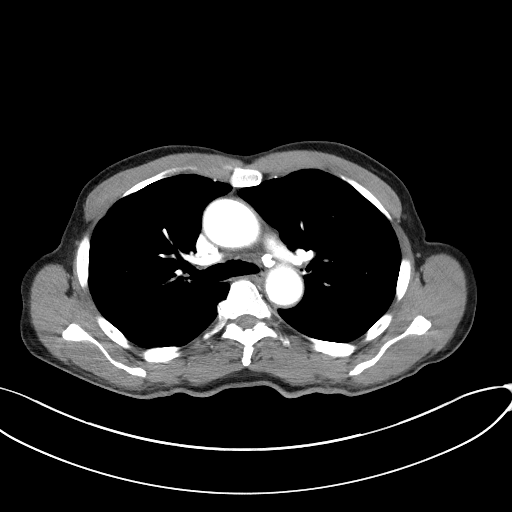
[im 121/135  mediastinal]
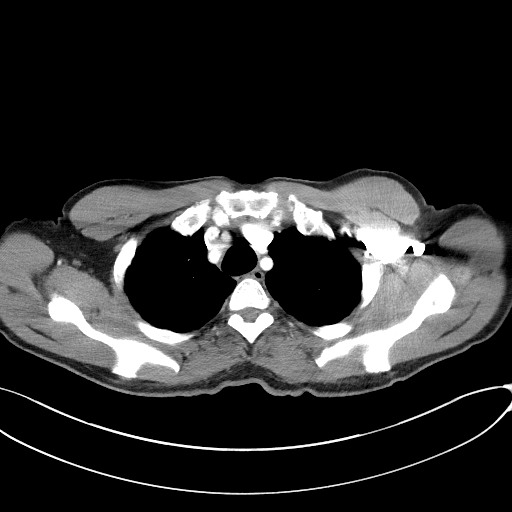
[im 121/135  bone]
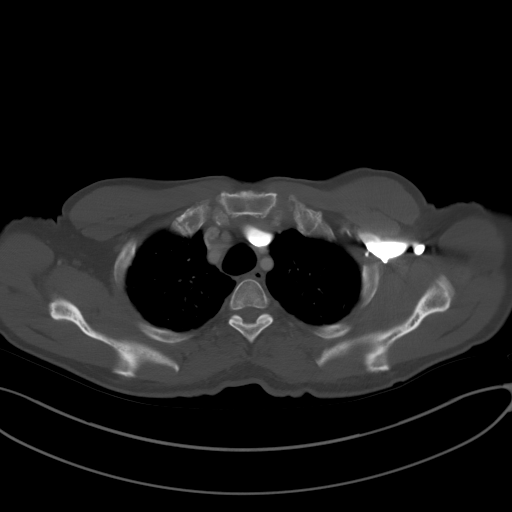

[Series 5: coronals · coronal · 0.82mm/px · 3 of 137 slices shown]
[im 28/137  mediastinal]
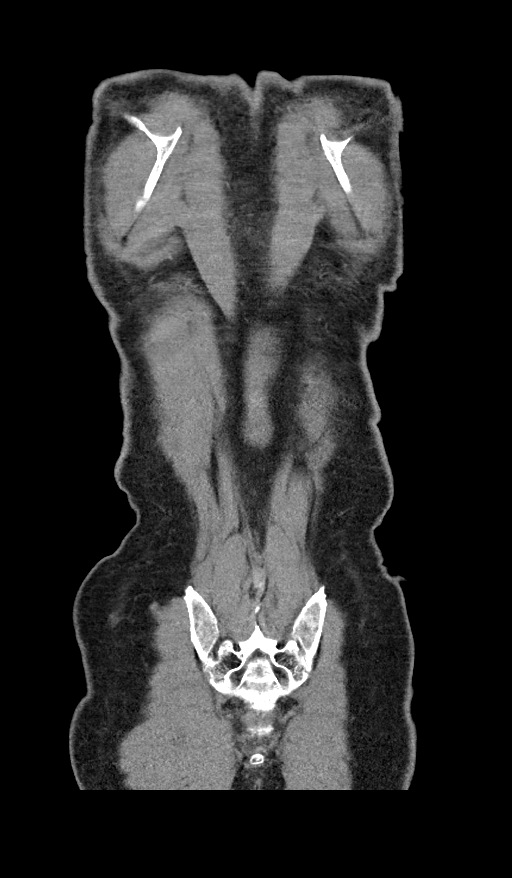
[im 55/137  mediastinal]
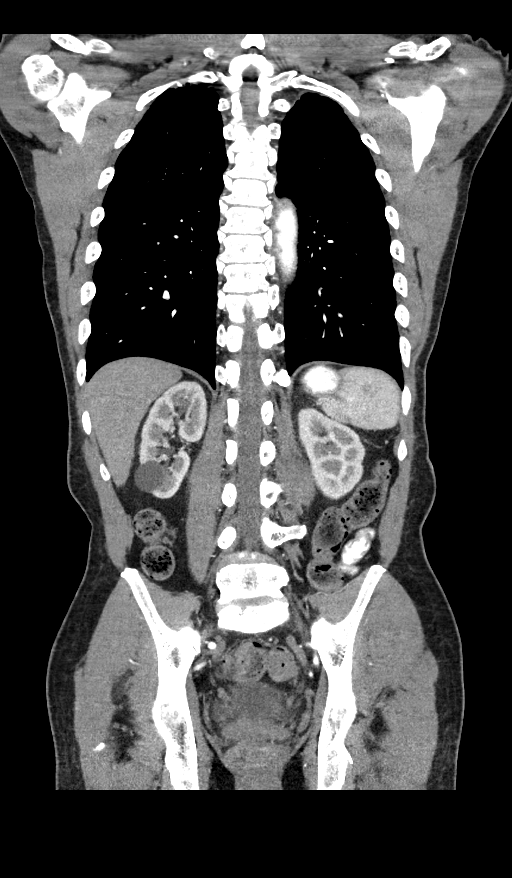
[im 82/137  mediastinal]
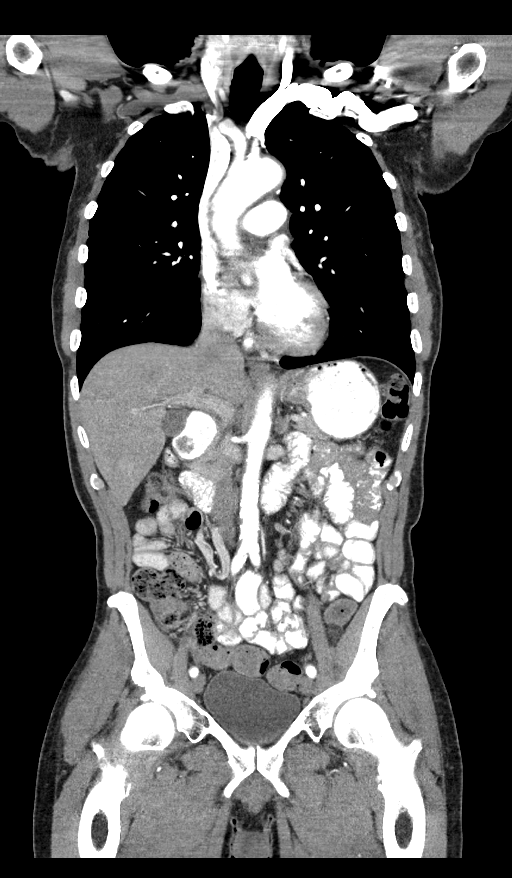

[12 of 36 positions shown; findings below may reference images not displayed]

FINDINGS: CT CHEST FINDINGS

Cardiovascular: The heart size is normal. No substantial pericardial
effusion. Ascending thoracic aorta measures 4 cm diameter, increased
from 3.5 cm on [DATE].

Mediastinum/Nodes: No mediastinal lymphadenopathy. Calcified nodal
tissue is seen scattered in the mediastinal fat. There is no hilar
lymphadenopathy. The esophagus has normal imaging features. There is
no axillary lymphadenopathy.

Lungs/Pleura: Mild pleuroparenchymal scarring noted in the right
lung apex. 3 mm right upper lobe nodule visible on 38/7. No
suspicious pulmonary nodule or mass. No focal airspace
consolidation. No pleural effusion.

Musculoskeletal: No worrisome lytic or sclerotic osseous
abnormality.

CT ABDOMEN PELVIS FINDINGS

Hepatobiliary: Subtle lesion posterior right hepatic dome (57/2)
measures 14 x 10 mm today compared to 8 x 9 mm previously. Index
lesion inferior right liver measured previously at 16 x 13 mm is 22
x 18 mm today (71/2). No new lesion identified in the liver. There
is no evidence for gallstones, gallbladder wall thickening, or
pericholecystic fluid. No intrahepatic or extrahepatic biliary
dilation.

Pancreas: No focal mass lesion. No dilatation of the main duct. No
intraparenchymal cyst. No peripancreatic edema.

Spleen: No splenomegaly. No focal mass lesion.

Adrenals/Urinary Tract: No adrenal nodule or mass. Slight increase
in size of right lower pole renal cyst, now measuring 2.4 cm (2.1 cm
remeasured previously). Left kidney unremarkable. No evidence for
hydroureter. The urinary bladder appears normal for the degree of
distention.

Stomach/Bowel: Stomach is moderately distended with contrast
material. Duodenum is normally positioned as is the ligament of
Treitz. No small bowel wall thickening. No small bowel dilatation.
The terminal ileum is normal. The appendix is not well visualized,
but there is no edema or inflammation in the region of the cecum. No
gross colonic mass. No colonic wall thickening.

Vascular/Lymphatic: There is moderate atherosclerotic calcification
of the abdominal aorta without aneurysm. There is no gastrohepatic
or hepatoduodenal ligament lymphadenopathy. Mild lymphadenopathy in
the central small bowel mesentery is similar to prior. 11 mm short
axis mesenteric node on 83/2 was 10 mm (remeasured) previously.
Small lymph nodes along the pelvic sidewall are similar to prior.

Reproductive: Fiducial markers noted in the prostate gland.

Other: No intraperitoneal free fluid.

Musculoskeletal: No worrisome lytic or sclerotic osseous
abnormality.
IMPRESSION: 1. Slight interval increase in size of the 2 liver metastases. No
new liver lesions evident.
2. Stable mild lymphadenopathy in the central small bowel mesentery.
3. 3 mm right upper lobe pulmonary nodule. Attention on follow-up
recommended.
4. 4 cm ascending thoracic aortic aneurysm, increased from 3.5 cm on
[DATE]. Recommend annual imaging followup by CTA or MRA. This
recommendation follows [R0]
ACCF/AHA/AATS/ACR/ASA/SCA/SAMA/SAMA/SAMA/SAMA Guidelines for the
Diagnosis and Management of Patients with Thoracic Aortic Disease.
Circulation. [R0]; 121: [id number]. Aortic aneurysm NOS ([R0]-[R0])
5. Aortic Atherosclerosis ([R0]-[R0]).

## 2021-07-29 MED ORDER — IOHEXOL 350 MG/ML SOLN
80.0000 mL | Freq: Once | INTRAVENOUS | Status: AC | PRN
  Administered 2021-07-29: 15:00:00 80 mL via INTRAVENOUS

## 2021-07-31 ENCOUNTER — Inpatient Hospital Stay: Payer: Medicare HMO | Admitting: Hematology

## 2021-07-31 ENCOUNTER — Other Ambulatory Visit: Payer: Self-pay

## 2021-07-31 ENCOUNTER — Inpatient Hospital Stay: Payer: Medicare HMO

## 2021-07-31 VITALS — BP 123/55 | HR 82 | Temp 98.6°F | Resp 18 | Ht 68.0 in | Wt 150.5 lb

## 2021-07-31 DIAGNOSIS — C7A00 Malignant carcinoid tumor of unspecified site: Secondary | ICD-10-CM | POA: Diagnosis not present

## 2021-07-31 DIAGNOSIS — C7B02 Secondary carcinoid tumors of liver: Secondary | ICD-10-CM | POA: Diagnosis not present

## 2021-07-31 DIAGNOSIS — Z7189 Other specified counseling: Secondary | ICD-10-CM

## 2021-07-31 DIAGNOSIS — C7B8 Other secondary neuroendocrine tumors: Secondary | ICD-10-CM

## 2021-07-31 MED ORDER — LANREOTIDE ACETATE 120 MG/0.5ML ~~LOC~~ SOLN
120.0000 mg | Freq: Once | SUBCUTANEOUS | Status: AC
  Administered 2021-07-31: 120 mg via SUBCUTANEOUS
  Filled 2021-07-31: qty 120

## 2021-07-31 MED ORDER — TRAMADOL HCL 50 MG PO TABS: 50.0000 mg | ORAL_TABLET | Freq: Four times a day (QID) | ORAL | 0 refills | Status: DC | PRN

## 2021-07-31 NOTE — Patient Instructions (Signed)
Lanreotide injection °What is this medication? °LANREOTIDE (lan REE oh tide) is used to reduce blood levels of growth hormone in patients with a condition called acromegaly. It also works to slow or stop tumor growth in patients with neuroendocrine tumors and treat carcinoid syndrome. °This medicine may be used for other purposes; ask your health care provider or pharmacist if you have questions. °COMMON BRAND NAME(S): Somatuline Depot °What should I tell my care team before I take this medication? °They need to know if you have any of these conditions: °diabetes °gallbladder disease °heart disease °kidney disease °liver disease °thyroid disease °an unusual or allergic reaction to lanreotide, other medicines, foods, dyes, or preservatives °pregnant or trying to get pregnant °breast-feeding °How should I use this medication? °This medicine is for injection under the skin. It is given by a health care professional in a hospital or clinic setting. °Contact your pediatrician or health care professional regarding the use of this medicine in children. Special care may be needed. °Overdosage: If you think you have taken too much of this medicine contact a poison control center or emergency room at once. °NOTE: This medicine is only for you. Do not share this medicine with others. °What if I miss a dose? °It is important not to miss your dose. Call your doctor or health care professional if you are unable to keep an appointment. °What may interact with this medication? °This medicine may interact with the following medications: °bromocriptine °cyclosporine °certain medicines for blood pressure, heart disease, irregular heart beat °certain medicines for diabetes °quinidine °terfenadine °This list may not describe all possible interactions. Give your health care provider a list of all the medicines, herbs, non-prescription drugs, or dietary supplements you use. Also tell them if you smoke, drink alcohol, or use illegal drugs.  Some items may interact with your medicine. °What should I watch for while using this medication? °Tell your doctor or healthcare professional if your symptoms do not start to get better or if they get worse. °Visit your doctor or health care professional for regular checks on your progress. Your condition will be monitored carefully while you are receiving this medicine. °This medicine may increase blood sugar. Ask your healthcare provider if changes in diet or medicines are needed if you have diabetes. °You may need blood work done while you are taking this medicine. °Women should inform their doctor if they wish to become pregnant or think they might be pregnant. There is a potential for serious side effects to an unborn child. Talk to your health care professional or pharmacist for more information. Do not breast-feed an infant while taking this medicine or for 6 months after stopping it. °This medicine has caused ovarian failure in some women. This medicine may interfere with the ability to have a child. Talk with your doctor or health care professional if you are concerned about your fertility. °What side effects may I notice from receiving this medication? °Side effects that you should report to your doctor or health care professional as soon as possible: °allergic reactions like skin rash, itching or hives, swelling of the face, lips, or tongue °increased blood pressure °severe stomach pain °signs and symptoms of hgh blood sugar such as being more thirsty or hungry or having to urinate more than normal. You may also feel very tired or have blurry vision. °signs and symptoms of low blood sugar such as feeling anxious; confusion; dizziness; increased hunger; unusually weak or tired; sweating; shakiness; cold; irritable; headache; blurred vision; fast   heartbeat; loss of consciousness °unusually slow heartbeat °Side effects that usually do not require medical attention (report to your doctor or health care  professional if they continue or are bothersome): °constipation °diarrhea °dizziness °headache °muscle pain °muscle spasms °nausea °pain, redness, or irritation at site where injected °This list may not describe all possible side effects. Call your doctor for medical advice about side effects. You may report side effects to FDA at 1-800-FDA-1088. °Where should I keep my medication? °This drug is given in a hospital or clinic and will not be stored at home. °NOTE: This sheet is a summary. It may not cover all possible information. If you have questions about this medicine, talk to your doctor, pharmacist, or health care provider. °© 2022 [employer]vier/Gold Standard (2018-07-07 00:00:00) ° °

## 2021-07-31 NOTE — Progress Notes (Signed)
Ryan Wilkerson   [phone] Fax:(336) [phone]   Clinic Follow up Note   Patient Care Team: Alroy Dust, L.Marlou Sa, MD as PCP - General (Family Medicine) Burnell Blanks, MD as PCP - Cardiology (Cardiology) Jonnie Finner, RN (Inactive) as Oncology Nurse Navigator Truitt Merle, MD as Consulting Physician (Oncology) Alla Feeling, NP as Nurse Practitioner (Nurse Practitioner) Alexis Frock, MD as Consulting Physician (Urology) Tyler Pita, MD as Consulting Physician (Radiation Oncology)  Date of Service:  07/31/2021  CHIEF COMPLAINT: f/u of metastatic neuroendocrine tumor  CURRENT THERAPY:  First-line octreotide (Sandostatin) monthly starting 05/10/20, held after cycle 1. Changed to monthly Lanreotide on 10/18/20.   ASSESSMENT & PLAN:  Ryan Wilkerson is a 75 y.o. male with   1.  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 PET dotatate on 04/09/2020 showed intense uptake in the central mesenteric mass with 3 well differentiated liver metastases and small peritoneal/nodal metastatic deposits along the left iliac vessels. There is no primary bowel lesion identified. There is a very small lesion in the pancreas with indeterminate activity.  -His 04/30/20 liver biopsy showed metastatic well differentiated/G1 neuroendocrine tumor to the liver.  IHC stains positive for synaptophysin, chromogranin, CD56 and CDX2 and negative for TTF-1-1.  Overall consistent with a GI primary.  Ki-67 of 1%.  -abdomen MRI on 05/16/20 ruled out pancreas as the primary site -He began first line Sandostatin 20 mg on 05/10/20. However on 05/12/20 he had pericarditis and treatment was held. I changed him to Lanreotide 147m every 4 weeks starting 10/18/20 -of note, colonoscopy on 04/15/21 at [facility] showed mild diverticulosis and internal  hemorrhoids. Recall due in 10 years. -restaging CT CAP on 07/29/21 showed stable mesentery nodes, slight increase in size of two liver lesions, no new lesions or other evidence of progression. I reviewed the results with him today. We discussed possibly switching his treatment to Afinitor. Given the slow growth of the metastases and his current excellent tolerance of Lanreotide, I recommend continuing current treatment. Plan to repeat scan again in 4-6 months, and may change his treatment if further progression  -he does still have intermittent pain to his right abdomen. I will prescribe tramadol for him today. -He is clinically doing well. Lab from 11/28 reviewed, adequate for treatment, will proceed with lanreotide today and continue every 4-week -labs and f/u in 8 weeks   2. Thoracic aortic aneurysm -measured 4 cm on recent CT CAP on 07/29/21, increased from 3.5 cm in 05/2020. -he has not seen cardiology since 09/2020, after his pericarditis. I advised him to f/u with them.  3. History of pericarditis -pericarditis occurred 2 days after first octreotide on 05/10/20.  -ED 05/12/2020 found to have inferior STE, echo showed preserved LV function and small pericardial effusion. CTA negative for aortic dissection. Urgent cath showed normal coronaries -recovered well   4. 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 MTammi Klippel-PSA down to 1.8 on 08/2018, but is now 16.7 as of 06/2021 -CT/bone scan in 6/21 showed no obvious metastatic disease from prostate cancer -CT A/P on 07/29/21 showed no pelvic concerns. -[facility] most recently seen 07/09/21. He has opted to continue active surveillance.   5. Family history/genetics -He  has personal and strong family history of cancers, including 7 brothers with prostate cancer, 1 of those has lung  cancer also, a sister with lung cancer and another sister with breast cancer -His 10/28/17 genetic testing was negative for pathogenic mutation or VUS.   6. 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   SUMMARY OF ONCOLOGIC HISTORY: Oncology History Overview Note  Cancer Staging No matching staging information was found for the patient.    Malignant neoplasm of prostate ([place])  08/31/2017 Initial Diagnosis   Malignant neoplasm of prostate ([place])   10/28/2017 Genetic Testing   The patient had genetic testing due to a personal history of prostate cancer and a family history of breast and stomach cancer.  The Common Hereditary Cancers Panel + Prostate Cancer Panel was ordered.  APC, ATM, AXIN2, BARD1, BMPR1A, BRCA1, BRCA2, BRIP1, CDH1, CDK4, CDKN2A (p14ARF), CDKN2A (p16INK4a), CHEK2, CTNNA1, DICER1, EPCAM*, FANCA, GREM1*, KIT, MEN1, MLH1, MSH2, MSH3, MSH6, MUTYH, NBN, NF1, PALB2, PDGFRA, PMS2, POLD1, POLE, PTEN, RAD50, RAD51C, RAD51D, SDHB, SDHC, SDHD, SMAD4, SMARCA4, STK11, TP53, TSC1, TSC2, VHL. The following genes were evaluated for sequence changes only: HOXB13*, NTHL1*, SDHA.   Results: Negative, no pathogenic variants identified. The date of this test report is 10/28/2017.    01/05/2018 - 02/12/2018 Radiation Therapy   S/p radiation to the prostate 70 Gy in 28 fractions from 01/05/2018-02/12/2018 per Dr. Tammi Klippel   01/31/2021 Imaging   CT A/P  IMPRESSION: 1. Interval decrease in size of hypodense lesions of the inferior right  lobe of the liver and posterior liver dome findings are consistent with treatment response of metastatic lesions. 2. No evidence of new metastatic disease in the abdomen or pelvis. 3. Prostatomegaly.   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  Upper normal 9 mm short axis lymph node in the adjacent mesentery 2.  1.6 x 1.4 cm heterogeneous, poorly defined lesion in the inferior right liver suspicious for metastatic disease, the differential includes GI primary or metastatic prostate cancer No retroperitoneal or pelvic sidewall lymphadenopathy   04/01/2020 PET scan   IMPRESSION: 1. Central mesenteric mass with intense radiotracer activity consistent well differentiated neuroendocrine tumor 2. 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 3. Small peritoneal/nodal metastasis along the LEFT iliac vessels. 4. Very small lesion in the pancreas with intermediate activity could represent primary lesion. No primary bowel lesion identified. 5. Small metastatic implant within the pericardium adjacent LEFT heart ventricle. 6. No skeletal metastasis.   04/30/2020 Pathology Results   FINAL MICROSCOPIC DIAGNOSIS:  A. 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  Tumor cells are negative  for TTF-1.  The findings are consistent with neuroendocrine tumor of  gastrointestinal origin.  Ki-67 stain shows a proliferative index of  about 1%, consistent with well-differentiated, grade 1 tumor.    04/30/2020 Initial Diagnosis   Metastatic malignant  neuroendocrine tumor to liver ([place])   05/10/2020 -  Chemotherapy   First-line octreotide (Sandostatin) monthly starting 05/10/20. Held after cycle 1 due to suspected pericarditis and deconditioning.           --Changed to monthly Lanreotide on 10/18/20.     01/31/2021 Imaging   CT A/P  IMPRESSION: 1. Interval decrease in size of hypodense lesions of the inferior right lobe of the liver and posterior liver dome findings are consistent with treatment response of metastatic lesions. 2. No evidence of new metastatic disease in the abdomen or pelvis. 3. Prostatomegaly.   07/29/2021 Imaging   EXAM: CT CHEST, ABDOMEN, AND PELVIS WITH CONTRAST  IMPRESSION: 1. Slight interval increase in size of the 2 liver metastases. No new liver lesions evident. 2. Stable mild lymphadenopathy in the central small bowel mesentery. 3. 3 mm right upper lobe pulmonary nodule. Attention on follow-up recommended. 4. 4 cm ascending thoracic aortic aneurysm, increased from 3.5 cm on 05/12/2020. Recommend annual imaging followup by CTA or MRA. This recommendation follows 2010 ACCF/AHA/AATS/ACR/ASA/SCA/SCAI/SIR/STS/SVM Guidelines for the Diagnosis and Management of Patients with Thoracic Aortic Disease. Circulation. 2010; 121: G387-F643. Aortic aneurysm NOS (ICD10-I71.9)  5. Aortic Atherosclerosis (ICD10-I70.0).      INTERVAL HISTORY:  Ryan Wilkerson is here for a follow up of metastatic NET. He was last seen by me on 05/02/21. He presents to the clinic alone. He reports continued intermittent pain to his upper right abdomen (under the ribcage, sometimes also on the left), usually preceding a bowel movement.   All other systems were reviewed with the patient and are negative.  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 [facility])    Nocturia    Pericardial effusion    Pericarditis    Prostate cancer [facility]) urologist-- dr Tresa Moore /  oncologist-- dr manning   prostate bx 06-24-2016 and 09-15-2017 at dr Tresa Moore office  Stage T1c, Gleason 4+3, PSA 8.48, vol 33cc---  planned external beam radiation therpay   Tobacco abuse    Urgency of urination    Wears glasses     SURGICAL HISTORY: Past Surgical History:  Procedure Laterality Date   COLONOSCOPY  last one 06/ 2018   GOLD SEED IMPLANT N/A 12/23/2017   Procedure: GOLD SEED IMPLANT;  Surgeon: Alexis Frock, MD;  Location: [facility];  Service: Urology;  Laterality: N/A;   LEFT HEART CATH AND CORONARY ANGIOGRAPHY N/A 05/12/2020   Procedure: LEFT HEART CATH AND CORONARY ANGIOGRAPHY;  Surgeon: Burnell Blanks, MD;  Location: [place] [facility];  Service: Cardiovascular;  Laterality: N/A;   PROSTATE BIOPSY  06-24-2016;  09-15-2017-- at dr Tresa Moore office   [place] N/A 12/23/2017   Procedure: [place];  Surgeon: Alexis Frock, MD;  Location: [facility];  Service: Urology;  Laterality: N/A;    I have reviewed  the social history and family history with the patient and they are unchanged from previous note.  ALLERGIES:  is allergic to aspirin, primidone, and simvastatin.  MEDICATIONS:  Current Outpatient Medications  Medication Sig Dispense Refill   acetaminophen (TYLENOL) 500 MG tablet Take 1,000 mg by mouth every 6 (six) hours as needed for headache (pain).      clopidogrel (PLAVIX) 75 MG tablet Take 75 mg by mouth daily.      fluticasone (FLONASE) 50 MCG/ACT nasal spray Place 2 sprays into both nostrils daily. 16 g 0   lisinopril-hydrochlorothiazide (PRINZIDE,ZESTORETIC)  20-12.5 MG tablet Take 0.5 tablets by mouth every morning.      loratadine (CLARITIN) 10 MG tablet Take 10 mg by mouth every morning.      OVER THE COUNTER MEDICATION Place 1 drop into both eyes daily as needed (dry eyes). Over the counter lubricating eye drop      pantoprazole (PROTONIX) 40 MG tablet Take 1 tablet (40 mg total) by mouth daily. 30 tablet 0   polyethylene glycol (MIRALAX / GLYCOLAX) 17 g packet Take 17 g by mouth 2 (two) times daily. 14 each 0   pravastatin (PRAVACHOL) 40 MG tablet Take 40 mg by mouth every morning.      SYMBICORT 160-4.5 MCG/ACT inhaler Inhale 1-2 puffs into the lungs as needed.     traMADol (ULTRAM) 50 MG tablet Take 1-2 tablets (50-100 mg total) by mouth every 6 (six) hours as needed for moderate pain or severe pain. 30 tablet 0   No current facility-administered medications for this visit.    PHYSICAL EXAMINATION: ECOG PERFORMANCE STATUS: 1 - Symptomatic but completely ambulatory  Vitals:   07/31/21 1452  BP: (!) 123/55  Pulse: 82  Resp: 18  Temp: 98.6 F (37 C)  SpO2: 100%   Wt Readings from Last 3 Encounters:  07/31/21 150 lb 8 oz (68.3 kg)  05/02/21 151 lb (68.5 kg)  02/07/21 148 lb 3.2 oz (67.2 kg)     GENERAL:alert, no distress and comfortable SKIN: skin color normal, no rashes or significant lesions EYES: normal, Conjunctiva are pink and non-injected, sclera clear  NEURO: alert & oriented x 3 with fluent speech  LABORATORY DATA:  I have reviewed the data as listed CBC Latest Ref Rng & Units 07/29/2021 05/02/2021 01/10/2021  WBC 4.0 - 10.5 K/uL 6.8 6.0 4.9  Hemoglobin 13.0 - 17.0 g/dL 12.8(L) 12.6(L) 11.2(L)  Hematocrit 39.0 - 52.0 % 38.7(L) 38.6(L) 33.9(L)  Platelets 150 - 400 K/uL 216 208 212     CMP Latest Ref Rng & Units 07/29/2021 05/02/2021 01/10/2021  Glucose 70 - 99 mg/dL 86 73 138(H)  BUN 8 - 23 mg/dL 14 9 8   Creatinine 0.61 - 1.24 mg/dL 1.22 1.07 1.11  Sodium 135 - 145 mmol/L 140 141 140  Potassium 3.5 - 5.1 mmol/L 4.1  3.8 3.6  Chloride 98 - 111 mmol/L 103 104 107  CO2 22 - 32 mmol/L 29 28 26   Calcium 8.9 - 10.3 mg/dL 9.0 9.2 8.7(L)  Total Protein 6.5 - 8.1 g/dL 7.0 7.1 6.2(L)  Total Bilirubin 0.3 - 1.2 mg/dL 0.5 0.6 0.7  Alkaline Phos 38 - 126 U/L 64 69 52  AST 15 - 41 U/L 29 25 20   ALT 0 - 44 U/L 31 31 18       RADIOGRAPHIC STUDIES: I have personally reviewed the radiological images as listed and agreed with the findings in the report. No results found.    No orders of the defined  types were placed in this encounter.  All questions were answered. The patient knows to call the clinic with any problems, questions or concerns. No barriers to learning was detected. The total time spent in the appointment was 30 minutes.     Truitt Merle, MD 07/31/2021   I, Wilburn Mylar, am acting as scribe for Truitt Merle, MD.   I have reviewed the above documentation for accuracy and completeness, and I agree with the above.

## 2021-08-01 ENCOUNTER — Telehealth: Payer: Self-pay | Admitting: Physician Assistant

## 2021-08-01 NOTE — Telephone Encounter (Signed)
-----   Message from Truitt Merle, MD sent at 07/31/2021 10:33 PM EST ----- I Lota Leamer,  You saw him in early 2022. His recent CT scan showed enlarging aortic aneurysm, would you follow up on that?  Thx  Truitt Merle

## 2021-08-01 NOTE — Telephone Encounter (Signed)
Will send msg to scheduling team to make sure pt gets scheduled for follow-up (last seen 09/2020 with recommendation to see in 6 months which did not occur). CT report 07/29/21 reviewed referencing "4 cm ascending thoracic aortic aneurysm, increased from 3.5 cm on 05/12/2020. Recommend annual imaging followup by CTA or MRA. This recommendation follows 2010 ACCF/AHA/AATS/ACR/ASA/SCA/SCAI/SIR/STS/SVM Guidelines for the Diagnosis and Management of Patients with Thoracic Aortic Disease." Latessa Tillis PA-C

## 2021-08-05 ENCOUNTER — Telehealth: Payer: Self-pay

## 2021-08-05 NOTE — Telephone Encounter (Signed)
Received a call from front desk, pt arrived to sign paperwork. Paperwork for injection is handled by [employer] in pharmacy. Ryan Wilkerson made aware and is to receive pt from lobby. Pt had no further questions at this time.

## 2021-08-06 ENCOUNTER — Other Ambulatory Visit: Payer: Self-pay

## 2021-08-06 ENCOUNTER — Encounter: Payer: Self-pay | Admitting: Cardiovascular Disease

## 2021-08-06 ENCOUNTER — Ambulatory Visit: Payer: Medicare HMO | Admitting: Cardiovascular Disease

## 2021-08-06 VITALS — BP 122/72 | HR 81 | Ht 68.0 in | Wt 151.2 lb

## 2021-08-06 DIAGNOSIS — I7121 Aneurysm of the ascending aorta, without rupture: Secondary | ICD-10-CM | POA: Diagnosis not present

## 2021-08-06 DIAGNOSIS — I3139 Other pericardial effusion (noninflammatory): Secondary | ICD-10-CM | POA: Diagnosis not present

## 2021-08-06 DIAGNOSIS — I10 Essential (primary) hypertension: Secondary | ICD-10-CM

## 2021-08-06 NOTE — Progress Notes (Signed)
Chief Complaint  Patient presents with   Follow-up    Pericarditis    History of Present Illness: 75 yo male with history of metastatic neuroendocrine tumor of the liver, HTN, HLD, prior CVA on Plavix, tobacco abuse, anemia, thoracic aortic aneurysm and pericarditis here today for cardiac follow up. He was admitted to [facility] in September 2021 with chest pain and hypotension. Cardiac cath September 2021 with no evidence of CAD. Echo September 2021 with LVEF=50-55% and a small pericardial effusion. He was treated with colchicine and ibuprofen for pericarditis. Follow up echo October 2021 with LVEF=55-60% and no pericardial effusion. He was seen in our office for follow up in January 2022 and was doing well. Recent CTA to look at his cancer showed a 4.4 cm ascending aortic aneurysm. His neuroendocrine tumor is being followed by Dr. Burr Medico and he is receiving once monthly Lanreotide.   He is here today for follow up. The patient denies any chest pain, dyspnea, palpitations, lower extremity edema, orthopnea, PND, dizziness, near syncope or syncope. Overall feeling well.   Primary Care Physician: Alroy Dust, L.Marlou Sa, MD  Past Medical History:  Diagnosis Date   Acute idiopathic pericarditis    Anemia    CVA (cerebral vascular accident) [facility])    Erectile dysfunction    Essential tremor    Family history of breast cancer    Family history of prostate cancer    Family history of stomach cancer    GERD (gastroesophageal reflux disease)    History of colon polyps    Hypercholesterolemia    Hypertension    Metastatic malignant neuroendocrine tumor to liver [facility])    Nocturia    Pericardial effusion    Pericarditis    Prostate cancer [facility]) urologist-- dr Tresa Moore /  oncologist-- dr manning   prostate bx 06-24-2016 and 09-15-2017 at dr Tresa Moore office  Stage T1c, Gleason 4+3, PSA 8.48, vol 33cc---  planned external beam radiation therpay   Tobacco abuse    Urgency of urination    Wears glasses     Past  Surgical History:  Procedure Laterality Date   COLONOSCOPY  last one 06/ 2018   GOLD SEED IMPLANT N/A 12/23/2017   Procedure: GOLD SEED IMPLANT;  Surgeon: Alexis Frock, MD;  Location: [facility];  Service: Urology;  Laterality: N/A;   LEFT HEART CATH AND CORONARY ANGIOGRAPHY N/A 05/12/2020   Procedure: LEFT HEART CATH AND CORONARY ANGIOGRAPHY;  Surgeon: Burnell Blanks, MD;  Location: [place] CV LAB;  Service: Cardiovascular;  Laterality: N/A;   PROSTATE BIOPSY  06-24-2016;  09-15-2017-- at dr Tresa Moore office   [place] N/A 12/23/2017   Procedure: [place];  Surgeon: Alexis Frock, MD;  Location: [facility];  Service: Urology;  Laterality: N/A;    Current Outpatient Medications  Medication Sig Dispense Refill   acetaminophen (TYLENOL) 500 MG tablet Take 1,000 mg by mouth every 6 (six) hours as needed for headache (pain).      clopidogrel (PLAVIX) 75 MG tablet Take 75 mg by mouth daily.      fluticasone (FLONASE) 50 MCG/ACT nasal spray Place 2 sprays into both nostrils daily. 16 g 0   lisinopril-hydrochlorothiazide (PRINZIDE,ZESTORETIC) 20-12.5 MG tablet Take 0.5 tablets by mouth every morning.      loratadine (CLARITIN) 10 MG tablet Take 10 mg by mouth every morning.      OVER THE COUNTER MEDICATION Place 1 drop into both eyes daily as needed (dry eyes). Over the counter lubricating  eye drop      pantoprazole (PROTONIX) 40 MG tablet Take 1 tablet (40 mg total) by mouth daily. 30 tablet 0   polyethylene glycol (MIRALAX / GLYCOLAX) 17 g packet Take 17 g by mouth 2 (two) times daily. 14 each 0   pravastatin (PRAVACHOL) 40 MG tablet Take 40 mg by mouth every morning.      SYMBICORT 160-4.5 MCG/ACT inhaler Inhale 1-2 puffs into the lungs as needed.     traMADol (ULTRAM) 50 MG tablet Take 1-2 tablets (50-100 mg total) by mouth every 6 (six) hours as needed for moderate pain or severe pain. 30 tablet 0   No current  facility-administered medications for this visit.    Allergies  Allergen Reactions   Aspirin Nausea And Vomiting and Other (See Comments)    "sharp stomach pain and excessive saliva"   Primidone     Other reaction(s): worsened tremor   Simvastatin Other (See Comments)    "back and mid section achy pain"    Social History   Socioeconomic History   Marital status: Married    Spouse name: Not on file   Number of children: 4   Years of education: Not on file   Highest education level: Not on file  Occupational History   Occupation: Retired    Comment: assembly plant  Tobacco Use   Smoking status: Former    Packs/day: 0.50    Years: 18.00    Pack years: 9.00    Types: Cigarettes    Quit date: 05/02/1974    Years since quitting: 47.2   Smokeless tobacco: Never  Vaping Use   Vaping Use: Never used  Substance and Sexual Activity   Alcohol use: No   Drug use: No   Sexual activity: Yes  Other Topics Concern   Not on file  Social History Narrative   Married with 3 daughters, 1 son. 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   Social Determinants of Health   Financial Resource Strain: Not on file  Food Insecurity: Not on file  Transportation Needs: Not on file  Physical Activity: Not on file  Stress: Not on file  Social Connections: Not on file  Intimate Partner Violence: Not on file    Family History  Problem Relation Age of Onset   Prostate cancer Brother 31   Prostate cancer Brother        metastatic/ late treatment dx in 60's/70's   Breast cancer Sister 45   Prostate cancer Brother        dx in 60's/70's   Prostate cancer Brother        dx 60's/70's   Prostate cancer Brother        dx 60's/70's   Prostate cancer Brother        31's   Lung cancer Brother    Prostate cancer Other    Prostate cancer Other    Lung cancer Sister     Review of Systems:  As stated in the  HPI and otherwise negative.   BP 122/72   Pulse 81   Ht 5\' 8"  (1.727 m)   Wt 151 lb 3.2 oz (68.6 kg)   SpO2 98%   BMI 22.99 kg/m   Physical Examination: General: Well developed, well nourished, NAD  HEENT: OP clear, mucus membranes moist  SKIN: warm, dry. No rashes. Neuro: No focal deficits  Musculoskeletal: Muscle strength 5/5 all ext  Psychiatric: Mood and affect normal  Neck: No JVD, no carotid bruits, no thyromegaly, no lymphadenopathy.  Lungs:Clear bilaterally, no wheezes, rhonci, crackles Cardiovascular: Regular rate and rhythm. No murmurs, gallops or rubs. Abdomen:Soft. Bowel sounds present. Non-tender.  Extremities: No lower extremity edema. Pulses are 2 + in the bilateral DP/PT.  EKG:  EKG is not ordered today. The ekg ordered today demonstrates   Recent Labs: 07/29/2021: ALT 31; BUN 14; Creatinine 1.22; Hemoglobin 12.8; Platelet Count 216; Potassium 4.1; Sodium 140   Lipid Panel    Component Value Date/Time   CHOL 129 05/12/2020 0106   TRIG 50 05/12/2020 0106   HDL 48 05/12/2020 0106   CHOLHDL 2.7 05/12/2020 0106   VLDL 10 05/12/2020 0106   LDLCALC 71 05/12/2020 0106     Wt Readings from Last 3 Encounters:  08/06/21 151 lb 3.2 oz (68.6 kg)  07/31/21 150 lb 8 oz (68.3 kg)  05/02/21 151 lb (68.5 kg)      Assessment and Plan:   1. History of pericarditis: No symptoms to suggest recurrence.   2. HTN: BP controlled. Continue current medications.   3. Thoracic aortic aneurysm: 4.4 cm by recent chest CTA that was arranged by Oncology for cancer surveillance. Will need repeat chest CTA one year. Lifting restrictions given.  Current medicines are reviewed at length with the patient today.  The patient does not have concerns regarding medicines.  The following changes have been made:  no change  Labs/ tests ordered today include:  No orders of the defined types were placed in this encounter.  Disposition:   F/U with me or office APP in one year.    Signed, Lauree Chandler, MD 08/06/2021 2:10 PM    [facility] [address], [place], [place]  [address] Phone: [phone]; Fax: [phone]

## 2021-08-06 NOTE — Patient Instructions (Signed)

## 2021-08-28 ENCOUNTER — Other Ambulatory Visit: Payer: Self-pay

## 2021-08-28 ENCOUNTER — Inpatient Hospital Stay: Payer: Medicare HMO | Attending: Nurse Practitioner

## 2021-08-28 VITALS — BP 116/65 | HR 87 | Temp 98.2°F | Resp 18

## 2021-08-28 DIAGNOSIS — C7B02 Secondary carcinoid tumors of liver: Secondary | ICD-10-CM | POA: Insufficient documentation

## 2021-08-28 DIAGNOSIS — C7A00 Malignant carcinoid tumor of unspecified site: Secondary | ICD-10-CM | POA: Insufficient documentation

## 2021-08-28 DIAGNOSIS — Z7189 Other specified counseling: Secondary | ICD-10-CM

## 2021-08-28 DIAGNOSIS — C7B8 Other secondary neuroendocrine tumors: Secondary | ICD-10-CM

## 2021-08-28 MED ORDER — LANREOTIDE ACETATE 120 MG/0.5ML ~~LOC~~ SOLN
120.0000 mg | Freq: Once | SUBCUTANEOUS | Status: AC
  Administered 2021-08-28: 120 mg via SUBCUTANEOUS
  Filled 2021-08-28: qty 120

## 2021-08-28 NOTE — Patient Instructions (Signed)
Lanreotide injection °What is this medication? °LANREOTIDE (lan REE oh tide) is used to reduce blood levels of growth hormone in patients with a condition called acromegaly. It also works to slow or stop tumor growth in patients with neuroendocrine tumors and treat carcinoid syndrome. °This medicine may be used for other purposes; ask your health care provider or pharmacist if you have questions. °COMMON BRAND NAME(S): Somatuline Depot °What should I tell my care team before I take this medication? °They need to know if you have any of these conditions: °diabetes °gallbladder disease °heart disease °kidney disease °liver disease °thyroid disease °an unusual or allergic reaction to lanreotide, other medicines, foods, dyes, or preservatives °pregnant or trying to get pregnant °breast-feeding °How should I use this medication? °This medicine is for injection under the skin. It is given by a health care professional in a hospital or clinic setting. °Contact your pediatrician or health care professional regarding the use of this medicine in children. Special care may be needed. °Overdosage: If you think you have taken too much of this medicine contact a poison control center or emergency room at once. °NOTE: This medicine is only for you. Do not share this medicine with others. °What if I miss a dose? °It is important not to miss your dose. Call your doctor or health care professional if you are unable to keep an appointment. °What may interact with this medication? °This medicine may interact with the following medications: °bromocriptine °cyclosporine °certain medicines for blood pressure, heart disease, irregular heart beat °certain medicines for diabetes °quinidine °terfenadine °This list may not describe all possible interactions. Give your health care provider a list of all the medicines, herbs, non-prescription drugs, or dietary supplements you use. Also tell them if you smoke, drink alcohol, or use illegal drugs.  Some items may interact with your medicine. °What should I watch for while using this medication? °Tell your doctor or healthcare professional if your symptoms do not start to get better or if they get worse. °Visit your doctor or health care professional for regular checks on your progress. Your condition will be monitored carefully while you are receiving this medicine. °This medicine may increase blood sugar. Ask your healthcare provider if changes in diet or medicines are needed if you have diabetes. °You may need blood work done while you are taking this medicine. °Women should inform their doctor if they wish to become pregnant or think they might be pregnant. There is a potential for serious side effects to an unborn child. Talk to your health care professional or pharmacist for more information. Do not breast-feed an infant while taking this medicine or for 6 months after stopping it. °This medicine has caused ovarian failure in some women. This medicine may interfere with the ability to have a child. Talk with your doctor or health care professional if you are concerned about your fertility. °What side effects may I notice from receiving this medication? °Side effects that you should report to your doctor or health care professional as soon as possible: °allergic reactions like skin rash, itching or hives, swelling of the face, lips, or tongue °increased blood pressure °severe stomach pain °signs and symptoms of hgh blood sugar such as being more thirsty or hungry or having to urinate more than normal. You may also feel very tired or have blurry vision. °signs and symptoms of low blood sugar such as feeling anxious; confusion; dizziness; increased hunger; unusually weak or tired; sweating; shakiness; cold; irritable; headache; blurred vision; fast   heartbeat; loss of consciousness °unusually slow heartbeat °Side effects that usually do not require medical attention (report to your doctor or health care  professional if they continue or are bothersome): °constipation °diarrhea °dizziness °headache °muscle pain °muscle spasms °nausea °pain, redness, or irritation at site where injected °This list may not describe all possible side effects. Call your doctor for medical advice about side effects. You may report side effects to FDA at 1-800-FDA-1088. °Where should I keep my medication? °This drug is given in a hospital or clinic and will not be stored at home. °NOTE: This sheet is a summary. It may not cover all possible information. If you have questions about this medicine, talk to your doctor, pharmacist, or health care provider. °© 2022 [employer]vier/Gold Standard (2018-07-07 00:00:00) ° °

## 2021-09-25 ENCOUNTER — Inpatient Hospital Stay: Payer: Medicare HMO | Attending: Nurse Practitioner

## 2021-09-25 ENCOUNTER — Inpatient Hospital Stay: Payer: Medicare HMO | Admitting: Hematology

## 2021-09-25 ENCOUNTER — Other Ambulatory Visit: Payer: Self-pay

## 2021-09-25 VITALS — BP 145/66 | HR 79 | Temp 98.6°F | Ht 68.0 in | Wt 152.2 lb

## 2021-09-25 DIAGNOSIS — C7B8 Other secondary neuroendocrine tumors: Secondary | ICD-10-CM

## 2021-09-25 DIAGNOSIS — Z8546 Personal history of malignant neoplasm of prostate: Secondary | ICD-10-CM | POA: Insufficient documentation

## 2021-09-25 DIAGNOSIS — Z923 Personal history of irradiation: Secondary | ICD-10-CM | POA: Insufficient documentation

## 2021-09-25 DIAGNOSIS — Z8679 Personal history of other diseases of the circulatory system: Secondary | ICD-10-CM | POA: Diagnosis not present

## 2021-09-25 DIAGNOSIS — I319 Disease of pericardium, unspecified: Secondary | ICD-10-CM | POA: Diagnosis not present

## 2021-09-25 DIAGNOSIS — Z79899 Other long term (current) drug therapy: Secondary | ICD-10-CM | POA: Diagnosis not present

## 2021-09-25 DIAGNOSIS — Z7189 Other specified counseling: Secondary | ICD-10-CM

## 2021-09-25 DIAGNOSIS — Z7902 Long term (current) use of antithrombotics/antiplatelets: Secondary | ICD-10-CM | POA: Insufficient documentation

## 2021-09-25 DIAGNOSIS — C7B02 Secondary carcinoid tumors of liver: Secondary | ICD-10-CM | POA: Diagnosis not present

## 2021-09-25 DIAGNOSIS — Z7951 Long term (current) use of inhaled steroids: Secondary | ICD-10-CM | POA: Insufficient documentation

## 2021-09-25 DIAGNOSIS — C7A00 Malignant carcinoid tumor of unspecified site: Secondary | ICD-10-CM | POA: Insufficient documentation

## 2021-09-25 MED ORDER — LANREOTIDE ACETATE 120 MG/0.5ML ~~LOC~~ SOLN
120.0000 mg | Freq: Once | SUBCUTANEOUS | Status: AC
  Administered 2021-09-25: 120 mg via SUBCUTANEOUS
  Filled 2021-09-25: qty 120

## 2021-09-25 NOTE — Progress Notes (Signed)
[place]   Telephone:(336) [phone] Fax:(336) [phone]   Clinic Follow up Note   Patient Care Team: Alroy Dust, L.Marlou Sa, MD as PCP - General (Family Medicine) Burnell Blanks, MD as PCP - Cardiology (Cardiology) Jonnie Finner, RN (Inactive) as Oncology Nurse Navigator Truitt Merle, MD as Consulting Physician (Oncology) Alla Feeling, NP as Nurse Practitioner (Nurse Practitioner) Alexis Frock, MD as Consulting Physician (Urology) Tyler Pita, MD as Consulting Physician (Radiation Oncology)  Date of Service:  09/25/2021  CHIEF COMPLAINT: f/u of metastatic neuroendocrine tumor  CURRENT THERAPY:  Lanreotide, monthly, since 10/18/20  ASSESSMENT & PLAN:  Ryan Wilkerson is a 76 y.o. male with   1.  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 PET dotatate on 04/09/2020 showed intense uptake in the central mesenteric mass with 3 well differentiated liver metastases and small peritoneal/nodal metastatic deposits along the left iliac vessels. There is no primary bowel lesion identified.   -His 04/30/20 liver biopsy showed metastatic well differentiated/G1 neuroendocrine tumor to the liver.  IHC stains positive for synaptophysin, chromogranin, CD56 and CDX2 and negative for TTF-1-1.  Overall consistent with a GI primary.  Ki-67 of 1%.  -abdomen MRI on 05/16/20 ruled out pancreas as the primary site -He began first line Sandostatin 20 mg on 05/10/20. However on 05/12/20 he had pericarditis and treatment was held. I changed him to Lanreotide 150m every 4 weeks starting 10/18/20 -of note, colonoscopy on 04/15/21 at [facility] showed mild diverticulosis and internal hemorrhoids. Recall due in 10 years. -restaging CT CAP on 07/29/21 showed stable mesentery nodes, slight increase in size of two liver lesions, no new lesions or  other evidence of progression.  -He is clinically doing well. His main complaint is constipation with associated bowel cramps. We discussed management today. Will proceed with lanreotide today and continue every 4-week -f/u in 16 weeks with scan several days before   2. Thoracic aortic aneurysm -measured 4 cm on recent CT CAP on 07/29/21, increased from 3.5 cm in 05/2020. -followed by Dr. MAngelena Form last sen 08/06/21. They plan for repeat CT in a year, and he was given lifting restrictions.   3. History of pericarditis -pericarditis occurred 2 days after first octreotide on 05/10/20.  -ED 05/12/2020 found to have inferior STE, echo showed preserved LV function and small pericardial effusion. CTA negative for aortic dissection. Urgent cath showed normal coronaries -recovered well   4. 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 MTammi Klippel-PSA down to 1.8 on 08/2018, but is now 16.7 as of 06/2021 -CT A/P on 07/29/21 showed no pelvic concerns. -[facility] most recently seen 07/09/21. He has opted to continue active surveillance.   5. Family history/genetics -He has personal and strong family history of cancers, including 7 brothers with prostate cancer, 1 of those has lung cancer also, a sister with lung cancer and another sister with breast cancer -His 10/28/17 genetic testing was negative for pathogenic mutation or VUS.   6. Comorbidities: HTN, HL, GERD, history of stroke 03/2019 with residual left side weakness and seizure-like activity -remote stroke in 03/2019, seizures controlled on Keppra -on Plavix, pt allergic to aspirin -continue follow-up with PCP, neurology     PLAN: -proceed with lanreotide  injection today  -lab and lanreotide injection every 4 weeks x4 -f/u in 16 weeks with lab and CT several days before   No  problem-specific Assessment & Plan notes found for this encounter.   SUMMARY OF ONCOLOGIC HISTORY: Oncology History Overview Note  Cancer Staging No matching staging information was found for the patient.    Malignant neoplasm of prostate ([place])  08/31/2017 Initial Diagnosis   Malignant neoplasm of prostate ([place])   10/28/2017 Genetic Testing   The patient had genetic testing due to a personal history of prostate cancer and a family history of breast and stomach cancer.  The Common Hereditary Cancers Panel + Prostate Cancer Panel was ordered.  APC, ATM, AXIN2, BARD1, BMPR1A, BRCA1, BRCA2, BRIP1, CDH1, CDK4, CDKN2A (p14ARF), CDKN2A (p16INK4a), CHEK2, CTNNA1, DICER1, EPCAM*, FANCA, GREM1*, KIT, MEN1, MLH1, MSH2, MSH3, MSH6, MUTYH, NBN, NF1, PALB2, PDGFRA, PMS2, POLD1, POLE, PTEN, RAD50, RAD51C, RAD51D, SDHB, SDHC, SDHD, SMAD4, SMARCA4, STK11, TP53, TSC1, TSC2, VHL. The following genes were evaluated for sequence changes only: HOXB13*, NTHL1*, SDHA.   Results: Negative, no pathogenic variants identified. The date of this test report is 10/28/2017.    01/05/2018 - 02/12/2018 Radiation Therapy   S/p radiation to the prostate 70 Gy in 28 fractions from 01/05/2018-02/12/2018 per Dr. Tammi Klippel   01/31/2021 Imaging   CT A/P  IMPRESSION: 1. Interval decrease in size of hypodense lesions of the inferior right lobe of the liver and posterior liver dome findings are consistent with treatment response of metastatic lesions. 2. No evidence of new metastatic disease in the abdomen or pelvis. 3. Prostatomegaly.   Metastatic malignant neuroendocrine tumor to liver ([place])  03/01/2020 Imaging   Impression: 2.5 x 2.0 x 3.0 cm soft tissue lesion in the central small bowel mesentery with associated dystrophic calcification and retraction of adjacent small bowel loops features highly suspicious for metastatic carcinoid tumor.  Upper normal 9 mm short axis lymph node in the adjacent mesentery 2.  1.6 x 1.4 cm  heterogeneous, poorly defined lesion in the inferior right liver suspicious for metastatic disease, the differential includes GI primary or metastatic prostate cancer No retroperitoneal or pelvic sidewall lymphadenopathy   04/01/2020 PET scan   IMPRESSION: 1. Central mesenteric mass with intense radiotracer activity consistent well differentiated neuroendocrine tumor 2. 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 3. Small peritoneal/nodal metastasis along the LEFT iliac vessels. 4. Very small lesion in the pancreas with intermediate activity could represent primary lesion. No primary bowel lesion identified. 5. Small metastatic implant within the pericardium adjacent LEFT heart ventricle. 6. No skeletal metastasis.   04/30/2020 Pathology Results   FINAL MICROSCOPIC DIAGNOSIS:  A. 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  Tumor cells are negative  for TTF-1.  The findings are consistent with neuroendocrine tumor of  gastrointestinal origin.  Ki-67 stain shows a proliferative index of  about 1%, consistent with well-differentiated, grade 1 tumor.    04/30/2020 Initial Diagnosis   Metastatic malignant neuroendocrine tumor to liver ([place])   05/10/2020 -  Chemotherapy   First-line octreotide (Sandostatin) monthly starting 05/10/20. Held after cycle 1 due to suspected pericarditis and deconditioning.           --Changed to monthly Lanreotide on 10/18/20.     01/31/2021 Imaging   CT A/P  IMPRESSION: 1. Interval decrease in size of hypodense lesions of the inferior right  lobe of the liver and posterior liver dome findings are consistent with treatment response of metastatic lesions. 2. No evidence of new metastatic disease in the abdomen or pelvis. 3. Prostatomegaly.   07/29/2021 Imaging    EXAM: CT CHEST, ABDOMEN, AND PELVIS WITH CONTRAST  IMPRESSION: 1. Slight interval increase in size of the 2 liver metastases. No new liver lesions evident. 2. Stable mild lymphadenopathy in the central small bowel mesentery. 3. 3 mm right upper lobe pulmonary nodule. Attention on follow-up recommended. 4. 4 cm ascending thoracic aortic aneurysm, increased from 3.5 cm on 05/12/2020. Recommend annual imaging followup by CTA or MRA. This recommendation follows 2010 ACCF/AHA/AATS/ACR/ASA/SCA/SCAI/SIR/STS/SVM Guidelines for the Diagnosis and Management of Patients with Thoracic Aortic Disease. Circulation. 2010; 121: C789-F810. Aortic aneurysm NOS (ICD10-I71.9)  5. Aortic Atherosclerosis (ICD10-I70.0).      INTERVAL HISTORY:  Ryan Wilkerson is here for a follow up of metastatic NET. He was last seen by me on 07/31/21. He presents to the clinic alone. He reports some bowel cramps, which are relieved with bowel movement. He notes his appetite has been good. He also reports difficulty staying asleep. He notes he tried tylenol PM and is taking melatonin 5 mg every night.   All other systems were reviewed with the patient and are negative.  MEDICAL HISTORY:  Past Medical History:  Diagnosis Date   Acute idiopathic pericarditis    Anemia    CV[facility]ce[facility] vascular accident) [facility])    Erectile dysfunction    Essential tremor    Family history of breast cancer    Family history of prostate cancer    Family history of stomach cancer    GERD (gastroesophageal reflux disease)    History of colon polyps    Hypercholesterolemia    Hypertension    Metastatic malignant neuroendocrine tumor to liver [facility])    Nocturia    Pericardial effusion    Pericarditis    Prostate cancer [facility]) urologist-- dr Tresa Moore /  oncologist-- dr manning   prostate bx 06-24-2016 and 09-15-2017 at dr Tresa Moore office  Stage T1c, Gleason 4+3, PSA 8.48, vol 33cc---  planned external beam radiation therpay   Tobacco abuse     Urgency of urination    Wears glasses     SURGICAL HISTORY: Past Surgical History:  Procedure Laterality Date   COLONOSCOPY  last one 06/ 2018   GOLD SEED IMPLANT N/A 12/23/2017   Procedure: GOLD SEED IMPLANT;  Surgeon: Alexis Frock, MD;  Location: [facility];  Service: Urology;  Laterality: N/A;   LEFT HEART CATH AND CORONARY ANGIOGRAPHY N/A 05/12/2020   Procedure: LEFT HEART CATH AND CORONARY ANGIOGRAPHY;  Surgeon: Burnell Blanks, MD;  Location: [place] CV LAB;  Service: Cardiovascular;  Laterality: N/A;   PROSTATE BIOPSY  06-24-2016;  09-15-2017-- at dr Tresa Moore office   [place] N/A 12/23/2017   Procedure: [place];  Surgeon: Alexis Frock, MD;  Location: [facility];  Service: Urology;  Laterality: N/A;    I have reviewed the social history and family history with the patient and they are unchanged from previous note.  ALLERGIES:  is allergic to aspirin, primidone, and simvastatin.  MEDICATIONS:  Current Outpatient Medications  Medication Sig Dispense Refill   acetaminophen (TYLENOL) 500 MG tablet Take 1,000 mg by mouth every 6 (six) hours as needed for headache (pain).      clopidogrel (PLAVIX) 75 MG tablet Take 75 mg by mouth daily.      fluticasone (  FLONASE) 50 MCG/ACT nasal spray Place 2 sprays into both nostrils daily. 16 g 0   lisinopril-hydrochlorothiazide (PRINZIDE,ZESTORETIC) 20-12.5 MG tablet Take 0.5 tablets by mouth every morning.      loratadine (CLARITIN) 10 MG tablet Take 10 mg by mouth every morning.      OVER THE COUNTER MEDICATION Place 1 drop into both eyes daily as needed (dry eyes). Over the counter lubricating eye drop      pantoprazole (PROTONIX) 40 MG tablet Take 1 tablet (40 mg total) by mouth daily. 30 tablet 0   polyethylene glycol (MIRALAX / GLYCOLAX) 17 g packet Take 17 g by mouth 2 (two) times daily. 14 each 0   pravastatin (PRAVACHOL) 40 MG tablet Take 40 mg by mouth every  morning.      SYMBICORT 160-4.5 MCG/ACT inhaler Inhale 1-2 puffs into the lungs as needed.     traMADol (ULTRAM) 50 MG tablet Take 1-2 tablets (50-100 mg total) by mouth every 6 (six) hours as needed for moderate pain or severe pain. 30 tablet 0   No current facility-administered medications for this visit.    PHYSICAL EXAMINATION: ECOG PERFORMANCE STATUS: 1 - Symptomatic but completely ambulatory  Vitals:   09/25/21 1337  BP: (!) 145/66  Pulse: 79  Temp: 98.6 F (37 C)  SpO2: 100%   Wt Readings from Last 3 Encounters:  09/25/21 152 lb 3.2 oz (69 kg)  08/06/21 151 lb 3.2 oz (68.6 kg)  07/31/21 150 lb 8 oz (68.3 kg)     GENERAL:alert, no distress and comfortable SKIN: skin color normal, no rashes or significant lesions EYES: normal, Conjunctiva are pink and non-injected, sclera clear  NEURO: alert & oriented x 3 with fluent speech  LABORATORY DATA:  I have reviewed the data as listed CBC Latest Ref Rng & Units 07/29/2021 05/02/2021 01/10/2021  WBC 4.0 - 10.5 K/uL 6.8 6.0 4.9  Hemoglobin 13.0 - 17.0 g/dL 12.8(L) 12.6(L) 11.2(L)  Hematocrit 39.0 - 52.0 % 38.7(L) 38.6(L) 33.9(L)  Platelets 150 - 400 K/uL 216 208 212     CMP Latest Ref Rng & Units 07/29/2021 05/02/2021 01/10/2021  Glucose 70 - 99 mg/dL 86 73 138(H)  BUN 8 - 23 mg/dL 14 9 8   Creatinine 0.61 - 1.24 mg/dL 1.22 1.07 1.11  Sodium 135 - 145 mmol/L 140 141 140  Potassium 3.5 - 5.1 mmol/L 4.1 3.8 3.6  Chloride 98 - 111 mmol/L 103 104 107  CO2 22 - 32 mmol/L 29 28 26   Calcium 8.9 - 10.3 mg/dL 9.0 9.2 8.7(L)  Total Protein 6.5 - 8.1 g/dL 7.0 7.1 6.2(L)  Total Bilirubin 0.3 - 1.2 mg/dL 0.5 0.6 0.7  Alkaline Phos 38 - 126 U/L 64 69 52  AST 15 - 41 U/L 29 25 20   ALT 0 - 44 U/L 31 31 18       RADIOGRAPHIC STUDIES: I have personally reviewed the radiological images as listed and agreed with the findings in the report. No results found.    Orders Placed This Encounter  Procedures   CT ABDOMEN PELVIS W CONTRAST     Standing Status:   Future    Standing Expiration Date:   09/25/2022    Order Specific Question:   If indicated for the ordered procedure, I authorize the administration of contrast media per Radiology protocol    Answer:   Yes    Order Specific Question:   Preferred imaging location?    Answer:   [facility]    Order Specific  Question:   Is Oral Contrast requested for this exam?    Answer:   Yes, Per Radiology protocol   All questions were answered. The patient knows to call the clinic with any problems, questions or concerns. No barriers to learning was detected. The total time spent in the appointment was 30 minutes.     Truitt Merle, MD 09/25/2021   I, Wilburn Mylar, am acting as scribe for Truitt Merle, MD.   I have reviewed the above documentation for accuracy and completeness, and I agree with the above.

## 2021-09-26 ENCOUNTER — Telehealth: Payer: Self-pay | Admitting: Hematology

## 2021-09-26 NOTE — Telephone Encounter (Signed)
Scheduled per 1/25 los, pt has been called and confirmed

## 2021-10-03 DIAGNOSIS — Z20822 Contact with and (suspected) exposure to covid-19: Secondary | ICD-10-CM | POA: Diagnosis not present

## 2021-10-03 NOTE — Progress Notes (Signed)
..  Patient is receiving Assistance Medication - Supplied Externally. Medication: Somatuline Dep[place] (lanreotide acetate) Manufacture: [employer] Approval Dates: Approved from 09/20/2021 until 08/31/2022. ID: [id number] Reason: EOOP First DOS: 09/25/2021.  *Re-enrollment for 2023*  .Marland KitchenJuan Quam, CPhT IV Drug Replacement Specialist [place] Phone: [phone]

## 2021-10-15 ENCOUNTER — Encounter: Payer: Self-pay | Admitting: Hematology

## 2021-10-23 ENCOUNTER — Other Ambulatory Visit: Payer: Self-pay

## 2021-10-23 ENCOUNTER — Inpatient Hospital Stay: Payer: Medicare HMO | Attending: Nurse Practitioner

## 2021-10-23 ENCOUNTER — Inpatient Hospital Stay: Payer: Medicare HMO

## 2021-10-23 VITALS — BP 151/80 | HR 84 | Temp 99.2°F | Resp 18

## 2021-10-23 DIAGNOSIS — C7B02 Secondary carcinoid tumors of liver: Secondary | ICD-10-CM | POA: Insufficient documentation

## 2021-10-23 DIAGNOSIS — Z7189 Other specified counseling: Secondary | ICD-10-CM

## 2021-10-23 DIAGNOSIS — C7A00 Malignant carcinoid tumor of unspecified site: Secondary | ICD-10-CM | POA: Insufficient documentation

## 2021-10-23 DIAGNOSIS — C7B8 Other secondary neuroendocrine tumors: Secondary | ICD-10-CM

## 2021-10-23 LAB — CMP (CANCER CENTER ONLY)
ALT: 21 U/L (ref 0–44)
AST: 23 U/L (ref 15–41)
Albumin: 3.8 g/dL (ref 3.5–5.0)
Alkaline Phosphatase: 58 U/L (ref 38–126)
Anion gap: 6 (ref 5–15)
BUN: 13 mg/dL (ref 8–23)
CO2: 30 mmol/L (ref 22–32)
Calcium: 8.9 mg/dL (ref 8.9–10.3)
Chloride: 105 mmol/L (ref 98–111)
Creatinine: 1.17 mg/dL (ref 0.61–1.24)
GFR, Estimated: >60 mL/min (ref >60)
Glucose, Bld: 87 mg/dL (ref 70–99)
Potassium: 3.9 mmol/L (ref 3.5–5.1)
Sodium: 141 mmol/L (ref 135–145)
Total Bilirubin: 0.5 mg/dL (ref 0.3–1.2)
Total Protein: 6.6 g/dL (ref 6.5–8.1)

## 2021-10-23 LAB — CBC WITH DIFFERENTIAL (CANCER CENTER ONLY)
Abs Immature Granulocytes: 0.02 10*3/uL (ref 0.00–0.07)
Basophils Absolute: 0 10*3/uL (ref 0.0–0.1)
Basophils Relative: 1 %
Eosinophils Absolute: 0.2 10*3/uL (ref 0.0–0.5)
Eosinophils Relative: 4 %
HCT: 37.3 % — ABNORMAL LOW (ref 39.0–52.0)
Hemoglobin: 12.3 g/dL — ABNORMAL LOW (ref 13.0–17.0)
Immature Granulocytes: 0 %
Lymphocytes Relative: 25 %
Lymphs Abs: 1.4 10*3/uL (ref 0.7–4.0)
MCH: 30.2 pg (ref 26.0–34.0)
MCHC: 33 g/dL (ref 30.0–36.0)
MCV: 91.6 fL (ref 80.0–100.0)
Monocytes Absolute: 0.5 10*3/uL (ref 0.1–1.0)
Monocytes Relative: 8 %
Neutro Abs: 3.6 10*3/uL (ref 1.7–7.7)
Neutrophils Relative %: 62 %
Platelet Count: 215 10*3/uL (ref 150–400)
RBC: 4.07 MIL/uL — ABNORMAL LOW (ref 4.22–5.81)
RDW: 11.9 % (ref 11.5–15.5)
WBC Count: 5.7 10*3/uL (ref 4.0–10.5)
nRBC: 0 % (ref 0.0–0.2)

## 2021-10-23 MED ORDER — LANREOTIDE ACETATE 120 MG/0.5ML ~~LOC~~ SOLN
120.0000 mg | Freq: Once | SUBCUTANEOUS | Status: AC
  Administered 2021-10-23: 120 mg via SUBCUTANEOUS
  Filled 2021-10-23: qty 120

## 2021-10-28 ENCOUNTER — Encounter: Payer: Self-pay | Admitting: Hematology

## 2021-10-30 ENCOUNTER — Encounter: Payer: Self-pay | Admitting: Hematology

## 2021-10-30 ENCOUNTER — Telehealth: Payer: Self-pay

## 2021-10-30 ENCOUNTER — Other Ambulatory Visit: Payer: Self-pay

## 2021-10-30 NOTE — Telephone Encounter (Addendum)
Pt called to speak with Cira Rue, NP regarding a bill he received from January 2023 regarding his Lanreotide Injection.  Pt stated last year he was getting this medication free but does not seem to be getting it free anymore.  Pt would like to know if [name] can help him get this resolved.  Informed pt I will make Cira Rue, NP aware of his situation but will also contact our financial assistance & pharmacy teams to see if their is something they can do to help this medication. ? ?Spoke with pt via telephone regarding previous telephone conversation regarding his bill for Lanreotide.  Informed pt that he was renewed in the [place] (Patient Copay Assistance Program) from 09/2021 to 08/2022.  Pt was inadvertently billed for the January Lanreotide Injection which has now been corrected.  Informed pt he should show  where that copay charge has been cancelled out on his account.  Pt verbalized understanding and had no further questions or concerns at this time.   ?

## 2021-10-31 DIAGNOSIS — Z0000 Encounter for general adult medical examination without abnormal findings: Secondary | ICD-10-CM | POA: Diagnosis not present

## 2021-10-31 DIAGNOSIS — K219 Gastro-esophageal reflux disease without esophagitis: Secondary | ICD-10-CM | POA: Diagnosis not present

## 2021-10-31 DIAGNOSIS — D3A8 Other benign neuroendocrine tumors: Secondary | ICD-10-CM | POA: Diagnosis not present

## 2021-10-31 DIAGNOSIS — E7800 Pure hypercholesterolemia, unspecified: Secondary | ICD-10-CM | POA: Diagnosis not present

## 2021-10-31 DIAGNOSIS — Z23 Encounter for immunization: Secondary | ICD-10-CM | POA: Diagnosis not present

## 2021-10-31 DIAGNOSIS — I10 Essential (primary) hypertension: Secondary | ICD-10-CM | POA: Diagnosis not present

## 2021-10-31 DIAGNOSIS — J309 Allergic rhinitis, unspecified: Secondary | ICD-10-CM | POA: Diagnosis not present

## 2021-10-31 DIAGNOSIS — I700 Atherosclerosis of aorta: Secondary | ICD-10-CM | POA: Diagnosis not present

## 2021-10-31 DIAGNOSIS — K5900 Constipation, unspecified: Secondary | ICD-10-CM | POA: Diagnosis not present

## 2021-11-01 ENCOUNTER — Telehealth: Payer: Self-pay

## 2021-11-01 ENCOUNTER — Encounter: Payer: Self-pay | Admitting: Hematology

## 2021-11-01 NOTE — Telephone Encounter (Signed)
Spoke with pt via telephone regarding the [place] he sent for Dr. Burr Medico to complete regarding his cancer and exposure to [place].  Dr. Burr Medico stated she is unable to sign this paperwork because she cannot say that the cancer the pt has is related to his exposure to [place].  Informed pt of this information regarding the [name].  Informed pt that Dr. Burr Medico can provide her last office note but will not sign the form for the [name] stating that his cancer is related to his exposure to [place].  Pt stated he is "OK" with just getting Dr. Ernestina Penna last office note.  Pt will pick-up uncompleted [employer] Disability Form and a copy of Dr. Ernestina Penna last office note on Monday.  Pt verbalized understanding and had no further questions or concerns. ?

## 2021-11-20 ENCOUNTER — Inpatient Hospital Stay: Payer: Medicare HMO

## 2021-11-20 ENCOUNTER — Inpatient Hospital Stay: Payer: Medicare HMO | Attending: Nurse Practitioner

## 2021-11-20 ENCOUNTER — Other Ambulatory Visit: Payer: Self-pay

## 2021-11-20 VITALS — BP 118/61 | HR 74 | Temp 98.5°F | Resp 18

## 2021-11-20 DIAGNOSIS — C7B02 Secondary carcinoid tumors of liver: Secondary | ICD-10-CM | POA: Diagnosis not present

## 2021-11-20 DIAGNOSIS — C7B8 Other secondary neuroendocrine tumors: Secondary | ICD-10-CM

## 2021-11-20 DIAGNOSIS — C7A00 Malignant carcinoid tumor of unspecified site: Secondary | ICD-10-CM | POA: Insufficient documentation

## 2021-11-20 DIAGNOSIS — Z7189 Other specified counseling: Secondary | ICD-10-CM

## 2021-11-20 LAB — CBC WITH DIFFERENTIAL (CANCER CENTER ONLY)
Abs Immature Granulocytes: 0.01 10*3/uL (ref 0.00–0.07)
Basophils Absolute: 0 10*3/uL (ref 0.0–0.1)
Basophils Relative: 1 %
Eosinophils Absolute: 0.5 10*3/uL (ref 0.0–0.5)
Eosinophils Relative: 8 %
HCT: 39.1 % (ref 39.0–52.0)
Hemoglobin: 12.5 g/dL — ABNORMAL LOW (ref 13.0–17.0)
Immature Granulocytes: 0 %
Lymphocytes Relative: 31 %
Lymphs Abs: 1.8 10*3/uL (ref 0.7–4.0)
MCH: 29.6 pg (ref 26.0–34.0)
MCHC: 32 g/dL (ref 30.0–36.0)
MCV: 92.4 fL (ref 80.0–100.0)
Monocytes Absolute: 0.5 10*3/uL (ref 0.1–1.0)
Monocytes Relative: 9 %
Neutro Abs: 2.9 10*3/uL (ref 1.7–7.7)
Neutrophils Relative %: 51 %
Platelet Count: 216 10*3/uL (ref 150–400)
RBC: 4.23 MIL/uL (ref 4.22–5.81)
RDW: 11.4 % — ABNORMAL LOW (ref 11.5–15.5)
WBC Count: 5.7 10*3/uL (ref 4.0–10.5)
nRBC: 0 % (ref 0.0–0.2)

## 2021-11-20 LAB — CMP (CANCER CENTER ONLY)
ALT: 18 U/L (ref 0–44)
AST: 19 U/L (ref 15–41)
Albumin: 3.9 g/dL (ref 3.5–5.0)
Alkaline Phosphatase: 57 U/L (ref 38–126)
Anion gap: 5 (ref 5–15)
BUN: 15 mg/dL (ref 8–23)
CO2: 30 mmol/L (ref 22–32)
Calcium: 9.2 mg/dL (ref 8.9–10.3)
Chloride: 102 mmol/L (ref 98–111)
Creatinine: 1.21 mg/dL (ref 0.61–1.24)
GFR, Estimated: >60 mL/min (ref >60)
Glucose, Bld: 114 mg/dL — ABNORMAL HIGH (ref 70–99)
Potassium: 4.1 mmol/L (ref 3.5–5.1)
Sodium: 137 mmol/L (ref 135–145)
Total Bilirubin: 0.5 mg/dL (ref 0.3–1.2)
Total Protein: 6.8 g/dL (ref 6.5–8.1)

## 2021-11-20 MED ORDER — LANREOTIDE ACETATE 120 MG/0.5ML ~~LOC~~ SOLN
120.0000 mg | Freq: Once | SUBCUTANEOUS | Status: AC
  Administered 2021-11-20: 120 mg via SUBCUTANEOUS
  Filled 2021-11-20: qty 120

## 2021-11-21 ENCOUNTER — Other Ambulatory Visit: Payer: Self-pay

## 2021-12-18 ENCOUNTER — Inpatient Hospital Stay: Payer: Medicare HMO | Attending: Nurse Practitioner

## 2021-12-18 ENCOUNTER — Other Ambulatory Visit: Payer: Self-pay

## 2021-12-18 ENCOUNTER — Inpatient Hospital Stay: Payer: Medicare HMO

## 2021-12-18 VITALS — BP 138/52 | HR 80 | Temp 98.4°F | Resp 18

## 2021-12-18 DIAGNOSIS — Z7189 Other specified counseling: Secondary | ICD-10-CM

## 2021-12-18 DIAGNOSIS — C7B02 Secondary carcinoid tumors of liver: Secondary | ICD-10-CM | POA: Insufficient documentation

## 2021-12-18 DIAGNOSIS — C7A00 Malignant carcinoid tumor of unspecified site: Secondary | ICD-10-CM | POA: Insufficient documentation

## 2021-12-18 DIAGNOSIS — C7B8 Other secondary neuroendocrine tumors: Secondary | ICD-10-CM

## 2021-12-18 LAB — CBC WITH DIFFERENTIAL (CANCER CENTER ONLY)
Abs Immature Granulocytes: 0.01 10*3/uL (ref 0.00–0.07)
Basophils Absolute: 0 10*3/uL (ref 0.0–0.1)
Basophils Relative: 1 %
Eosinophils Absolute: 0.3 10*3/uL (ref 0.0–0.5)
Eosinophils Relative: 6 %
HCT: 35 % — ABNORMAL LOW (ref 39.0–52.0)
Hemoglobin: 11.5 g/dL — ABNORMAL LOW (ref 13.0–17.0)
Immature Granulocytes: 0 %
Lymphocytes Relative: 24 %
Lymphs Abs: 1.3 10*3/uL (ref 0.7–4.0)
MCH: 30.2 pg (ref 26.0–34.0)
MCHC: 32.9 g/dL (ref 30.0–36.0)
MCV: 91.9 fL (ref 80.0–100.0)
Monocytes Absolute: 0.4 10*3/uL (ref 0.1–1.0)
Monocytes Relative: 7 %
Neutro Abs: 3.2 10*3/uL (ref 1.7–7.7)
Neutrophils Relative %: 62 %
Platelet Count: 191 10*3/uL (ref 150–400)
RBC: 3.81 MIL/uL — ABNORMAL LOW (ref 4.22–5.81)
RDW: 11.7 % (ref 11.5–15.5)
WBC Count: 5.2 10*3/uL (ref 4.0–10.5)
nRBC: 0 % (ref 0.0–0.2)

## 2021-12-18 LAB — CMP (CANCER CENTER ONLY)
ALT: 17 U/L (ref 0–44)
AST: 19 U/L (ref 15–41)
Albumin: 3.6 g/dL (ref 3.5–5.0)
Alkaline Phosphatase: 51 U/L (ref 38–126)
Anion gap: 4 — ABNORMAL LOW (ref 5–15)
BUN: 15 mg/dL (ref 8–23)
CO2: 30 mmol/L (ref 22–32)
Calcium: 8.7 mg/dL — ABNORMAL LOW (ref 8.9–10.3)
Chloride: 103 mmol/L (ref 98–111)
Creatinine: 1.32 mg/dL — ABNORMAL HIGH (ref 0.61–1.24)
GFR, Estimated: 56 mL/min — ABNORMAL LOW (ref >60)
Glucose, Bld: 161 mg/dL — ABNORMAL HIGH (ref 70–99)
Potassium: 3.9 mmol/L (ref 3.5–5.1)
Sodium: 137 mmol/L (ref 135–145)
Total Bilirubin: 0.5 mg/dL (ref 0.3–1.2)
Total Protein: 6.3 g/dL — ABNORMAL LOW (ref 6.5–8.1)

## 2021-12-18 MED ORDER — LANREOTIDE ACETATE 120 MG/0.5ML ~~LOC~~ SOLN
120.0000 mg | Freq: Once | SUBCUTANEOUS | Status: AC
  Administered 2021-12-18: 120 mg via SUBCUTANEOUS
  Filled 2021-12-18: qty 120

## 2021-12-20 LAB — 5 HIAA, QUANTITATIVE, URINE, 24 HOUR
5-HIAA, Ur: 3.6 mg/L
5-HIAA,Quant.,24 Hr Urine: 2.2 mg/24 hr (ref 0.0–14.9)
Total Volume: 600

## 2022-01-13 ENCOUNTER — Inpatient Hospital Stay: Payer: Medicare HMO | Attending: Nurse Practitioner

## 2022-01-13 ENCOUNTER — Ambulatory Visit (HOSPITAL_COMMUNITY)
Admission: RE | Admit: 2022-01-13 | Discharge: 2022-01-13 | Disposition: A | Discharge status: Home / Self Care | Payer: Medicare HMO | Source: Ambulatory Visit | Attending: Hematology | Admitting: Hematology

## 2022-01-13 ENCOUNTER — Other Ambulatory Visit: Payer: Self-pay

## 2022-01-13 DIAGNOSIS — I712 Thoracic aortic aneurysm, without rupture, unspecified: Secondary | ICD-10-CM | POA: Diagnosis not present

## 2022-01-13 DIAGNOSIS — Z79899 Other long term (current) drug therapy: Secondary | ICD-10-CM | POA: Diagnosis not present

## 2022-01-13 DIAGNOSIS — K219 Gastro-esophageal reflux disease without esophagitis: Secondary | ICD-10-CM | POA: Diagnosis not present

## 2022-01-13 DIAGNOSIS — E785 Hyperlipidemia, unspecified: Secondary | ICD-10-CM | POA: Diagnosis not present

## 2022-01-13 DIAGNOSIS — I700 Atherosclerosis of aorta: Secondary | ICD-10-CM | POA: Diagnosis not present

## 2022-01-13 DIAGNOSIS — C801 Malignant (primary) neoplasm, unspecified: Secondary | ICD-10-CM | POA: Diagnosis not present

## 2022-01-13 DIAGNOSIS — C7B8 Other secondary neuroendocrine tumors: Secondary | ICD-10-CM | POA: Insufficient documentation

## 2022-01-13 DIAGNOSIS — Z8546 Personal history of malignant neoplasm of prostate: Secondary | ICD-10-CM | POA: Diagnosis not present

## 2022-01-13 DIAGNOSIS — C7B02 Secondary carcinoid tumors of liver: Secondary | ICD-10-CM | POA: Insufficient documentation

## 2022-01-13 DIAGNOSIS — N281 Cyst of kidney, acquired: Secondary | ICD-10-CM | POA: Diagnosis not present

## 2022-01-13 DIAGNOSIS — Z8673 Personal history of transient ischemic attack (TIA), and cerebral infarction without residual deficits: Secondary | ICD-10-CM | POA: Insufficient documentation

## 2022-01-13 DIAGNOSIS — C7A00 Malignant carcinoid tumor of unspecified site: Secondary | ICD-10-CM | POA: Diagnosis not present

## 2022-01-13 DIAGNOSIS — C787 Secondary malignant neoplasm of liver and intrahepatic bile duct: Secondary | ICD-10-CM | POA: Diagnosis not present

## 2022-01-13 DIAGNOSIS — I10 Essential (primary) hypertension: Secondary | ICD-10-CM | POA: Insufficient documentation

## 2022-01-13 DIAGNOSIS — K7689 Other specified diseases of liver: Secondary | ICD-10-CM | POA: Diagnosis not present

## 2022-01-13 LAB — CBC WITH DIFFERENTIAL (CANCER CENTER ONLY)
Abs Immature Granulocytes: 0.01 10*3/uL (ref 0.00–0.07)
Basophils Absolute: 0 10*3/uL (ref 0.0–0.1)
Basophils Relative: 0 %
Eosinophils Absolute: 0.4 10*3/uL (ref 0.0–0.5)
Eosinophils Relative: 7 %
HCT: 37.2 % — ABNORMAL LOW (ref 39.0–52.0)
Hemoglobin: 12.1 g/dL — ABNORMAL LOW (ref 13.0–17.0)
Immature Granulocytes: 0 %
Lymphocytes Relative: 24 %
Lymphs Abs: 1.4 10*3/uL (ref 0.7–4.0)
MCH: 30 pg (ref 26.0–34.0)
MCHC: 32.5 g/dL (ref 30.0–36.0)
MCV: 92.1 fL (ref 80.0–100.0)
Monocytes Absolute: 0.5 10*3/uL (ref 0.1–1.0)
Monocytes Relative: 8 %
Neutro Abs: 3.6 10*3/uL (ref 1.7–7.7)
Neutrophils Relative %: 61 %
Platelet Count: 212 10*3/uL (ref 150–400)
RBC: 4.04 MIL/uL — ABNORMAL LOW (ref 4.22–5.81)
RDW: 11.7 % (ref 11.5–15.5)
WBC Count: 5.9 10*3/uL (ref 4.0–10.5)
nRBC: 0 % (ref 0.0–0.2)

## 2022-01-13 LAB — CMP (CANCER CENTER ONLY)
ALT: 19 U/L (ref 0–44)
AST: 24 U/L (ref 15–41)
Albumin: 3.8 g/dL (ref 3.5–5.0)
Alkaline Phosphatase: 56 U/L (ref 38–126)
Anion gap: 4 — ABNORMAL LOW (ref 5–15)
BUN: 12 mg/dL (ref 8–23)
CO2: 32 mmol/L (ref 22–32)
Calcium: 9 mg/dL (ref 8.9–10.3)
Chloride: 101 mmol/L (ref 98–111)
Creatinine: 1.46 mg/dL — ABNORMAL HIGH (ref 0.61–1.24)
GFR, Estimated: 50 mL/min — ABNORMAL LOW (ref >60)
Glucose, Bld: 95 mg/dL (ref 70–99)
Potassium: 4.3 mmol/L (ref 3.5–5.1)
Sodium: 137 mmol/L (ref 135–145)
Total Bilirubin: 0.5 mg/dL (ref 0.3–1.2)
Total Protein: 6.7 g/dL (ref 6.5–8.1)

## 2022-01-13 MED ORDER — IOHEXOL 300 MG/ML  SOLN
100.0000 mL | Freq: Once | INTRAMUSCULAR | Status: AC | PRN
  Administered 2022-01-13: 100 mL via INTRAVENOUS

## 2022-01-13 MED ORDER — SODIUM CHLORIDE (PF) 0.9 % IJ SOLN
INTRAMUSCULAR | Status: AC
  Filled 2022-01-13: qty 50

## 2022-01-15 ENCOUNTER — Other Ambulatory Visit: Payer: Self-pay

## 2022-01-15 ENCOUNTER — Inpatient Hospital Stay: Payer: Medicare HMO | Admitting: Hematology

## 2022-01-15 ENCOUNTER — Encounter: Payer: Self-pay | Admitting: Hematology

## 2022-01-15 ENCOUNTER — Inpatient Hospital Stay: Payer: Medicare HMO

## 2022-01-15 VITALS — BP 129/60 | HR 84 | Temp 98.3°F | Resp 18 | Ht 68.0 in | Wt 146.8 lb

## 2022-01-15 DIAGNOSIS — C7B8 Other secondary neuroendocrine tumors: Secondary | ICD-10-CM | POA: Diagnosis not present

## 2022-01-15 DIAGNOSIS — C7B02 Secondary carcinoid tumors of liver: Secondary | ICD-10-CM | POA: Diagnosis not present

## 2022-01-15 DIAGNOSIS — Z8546 Personal history of malignant neoplasm of prostate: Secondary | ICD-10-CM | POA: Diagnosis not present

## 2022-01-15 DIAGNOSIS — E785 Hyperlipidemia, unspecified: Secondary | ICD-10-CM | POA: Diagnosis not present

## 2022-01-15 DIAGNOSIS — I712 Thoracic aortic aneurysm, without rupture, unspecified: Secondary | ICD-10-CM | POA: Diagnosis not present

## 2022-01-15 DIAGNOSIS — Z7189 Other specified counseling: Secondary | ICD-10-CM

## 2022-01-15 DIAGNOSIS — Z8673 Personal history of transient ischemic attack (TIA), and cerebral infarction without residual deficits: Secondary | ICD-10-CM | POA: Diagnosis not present

## 2022-01-15 DIAGNOSIS — C7A00 Malignant carcinoid tumor of unspecified site: Secondary | ICD-10-CM | POA: Diagnosis not present

## 2022-01-15 DIAGNOSIS — K219 Gastro-esophageal reflux disease without esophagitis: Secondary | ICD-10-CM | POA: Diagnosis not present

## 2022-01-15 DIAGNOSIS — I10 Essential (primary) hypertension: Secondary | ICD-10-CM | POA: Diagnosis not present

## 2022-01-15 MED ORDER — LANREOTIDE ACETATE 120 MG/0.5ML ~~LOC~~ SOLN
120.0000 mg | Freq: Once | SUBCUTANEOUS | Status: AC
  Administered 2022-01-15: 120 mg via SUBCUTANEOUS
  Filled 2022-01-15: qty 120

## 2022-01-15 MED ORDER — DICYCLOMINE HCL 10 MG PO CAPS: 10.0000 mg | ORAL_CAPSULE | Freq: Three times a day (TID) | ORAL | 0 refills | Status: DC | PRN

## 2022-01-15 NOTE — Progress Notes (Signed)
?[place]   ?[phone] [phone]   ?Clinic Follow up Note  ? ?Patient Care Team: ?Alroy Dust, L.Marlou Sa, MD as PCP - General (Family Medicine) ?Burnell Blanks, MD as PCP - Cardiology (Cardiology) ?Jonnie Finner, RN (Inactive) as Oncology Nurse Navigator ?Truitt Merle, MD as Consulting Physician (Oncology) ?Alla Feeling, NP as Nurse Practitioner (Nurse Practitioner) ?Alexis Frock, MD as Consulting Physician (Urology) ?Tyler Pita, MD as Consulting Physician (Radiation Oncology) ? ?Date of Service:  01/15/2022 ? ?CHIEF COMPLAINT: f/u of metastatic neuroendocrine tumor ? ?CURRENT THERAPY:  ?Lanreotide, monthly, since 10/18/20 ? ?ASSESSMENT & PLAN:  ?Ryan Wilkerson is a 76 y.o. male with  ? ?1.  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 PET dotatate on 04/09/2020 showed intense uptake in the central mesenteric mass with 3 well differentiated liver metastases and small peritoneal/nodal metastatic deposits along the left iliac vessels. There is no primary bowel lesion identified.   ?-His 04/30/20 liver biopsy showed metastatic well differentiated/G1 neuroendocrine tumor to the liver.  IHC stains positive for synaptophysin, chromogranin, CD56 and CDX2 and negative for TTF-1-1.  Overall consistent with a GI primary.  Ki-67 of 1%.  ?-abdomen MRI on 05/16/20 ruled out pancreas as the primary site ?-He began first line Sandostatin 20 mg on 05/10/20. However on 05/12/20 he had pericarditis and treatment was held. I changed him to Lanreotide 146m every 4 weeks starting 10/18/20 ?-of note, [facility] and internal hemorrhoids. Recall due in 10 years. ?-restaging CT CAP on 01/13/22 showed: stable soft tissue lesion within ileum; stable to mild increase in mesenteric and left  pelvic nodes; stable liver lesions.  Overall stable disease.  I reviewed the images and discussed the results with him today. ?-He is clinically doing well. He reports he is managing his BM's well. I refilled his bentyl. Will proceed with lanreotide today and continue every 4-week ?-f/u in 8 weeks. Plan for another scan in 4-6 months. ?  ?2. Thoracic aortic aneurysm ?-measured 4 cm on recent CT CAP on 07/29/21, increased from 3.5 cm in 05/2020. ?-followed by Dr. MAngelena Form last seen 08/06/21. They plan for repeat CT in a year, and he was given lifting restrictions. ?  ?3. History of pericarditis ?-pericarditis occurred 2 days after first octreotide on 05/10/20.  ?-ED 05/12/2020 found to have inferior STE, echo showed preserved LV function and small pericardial effusion. CTA negative for aortic dissection. Urgent cath showed normal coronaries ?-recovered well ?  ?4. 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 MTammi Klippel?-PSA down to 1.8 on 08/2018, but is now 16.7 as of 06/2021 ?-CT A/P on 01/13/22 showed no prostate concerns. ?-[facility] most recently seen 07/09/21. He has opted to continue active surveillance. ?  ?5. Family history/genetics ?-He has personal and strong family history of cancers, including 7 brothers with prostate cancer and a sister with breast cancer ?-His 10/28/17 genetic testing was negative. ?  ?6. 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  ?  ?PLAN: ?-proceed with  lanreotide injection today  ?-I refilled bentyl ?-lab and lanreotide injection every 4 weeks ?-lab and f/u in 8 weeks ? ? ?No problem-specific Assessment & Plan notes found for  this encounter. ? ? ?SUMMARY OF ONCOLOGIC HISTORY: ?Oncology History Overview Note  ?Cancer Staging ?No matching staging information was found for the patient. ? ?  ?Malignant neoplasm of prostate ([place])  ?08/31/2017 Initial Diagnosis  ? Malignant neoplasm of prostate ([place]) ? ?  ?10/28/2017 Genetic Testing  ? The patient had genetic testing due to a personal history of prostate cancer and a family history of breast and stomach cancer.  The Common Hereditary Cancers Panel + Prostate Cancer Panel was ordered.  APC, ATM, AXIN2, BARD1, BMPR1A, BRCA1, BRCA2, BRIP1, CDH1, CDK4, CDKN2A (p14ARF), CDKN2A (p16INK4a), CHEK2, CTNNA1, DICER1, EPCAM*, FANCA, GREM1*, KIT, MEN1, MLH1, MSH2, MSH3, MSH6, MUTYH, NBN, NF1, PALB2, PDGFRA, PMS2, POLD1, POLE, PTEN, RAD50, RAD51C, RAD51D, SDHB, SDHC, SDHD, SMAD4, SMARCA4, STK11, TP53, TSC1, TSC2, VHL. The following genes were evaluated for sequence changes only: HOXB13*, NTHL1*, SDHA.  ? ?Results: Negative, no pathogenic variants identified. The date of this test report is 10/28/2017.  ? ?  ?01/05/2018 - 02/12/2018 Radiation Therapy  ? S/p radiation to the prostate 70 Gy in 28 fractions from 01/05/2018-02/12/2018 per Dr. Tammi Klippel ?  ?01/31/2021 Imaging  ? CT A/P ? ?IMPRESSION: ?1. Interval decrease in size of hypodense lesions of the inferior ?right lobe of the liver and posterior liver dome findings are ?consistent with treatment response of metastatic lesions. ?2. No evidence of new metastatic disease in the abdomen or pelvis. ?3. Prostatomegaly. ?  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 Impression: ?2.5 x 2.0 x 3.0 cm soft tissue lesion in the central small bowel mesentery with associated dystrophic calcification and retraction of adjacent small bowel loops features highly suspicious for metastatic carcinoid tumor.  Upper normal 9 mm short axis lymph node in the adjacent mesentery ?2.  1.6 x 1.4 cm heterogeneous, poorly defined lesion in the inferior right  liver suspicious for metastatic disease, the differential includes GI primary or metastatic prostate cancer ?No retroperitoneal or pelvic sidewall lymphadenopathy ?  ?04/01/2020 PET scan  ? IMPRESSION: ?1. Central mesenteric mass with intense radiotracer activity ?consistent well differentiated neuroendocrine tumor ?2. Three well differentiated neuroendocrine tumor metastasis to the ?liver. ?3. Small peritoneal/nodal metastasis along the LEFT iliac vessels. ?4. Very small lesion in the pancreas with intermediate activity ?could represent primary lesion. No primary bowel lesion identified. ?5. Small metastatic implant within the pericardium adjacent LEFT ?heart ventricle. ?6. No skeletal metastasis. ?  ?04/30/2020 Pathology Results  ? FINAL MICROSCOPIC DIAGNOSIS:  ?A. LIVER, BIOPSY:  ?- Metastatic well-differentiated (G1) neuroendocrine tumor to the liver ?COMMENT:  ?Immunohistochemical stains show that the tumor cells are positive for  ?synaptophysin, chromogranin, CD56 and CDX2.  Tumor cells are negative  ?for TTF-1.  The findings are consistent with neuroendocrine tumor of  ?gastrointestinal origin.  Ki-67 stain shows a proliferative index of  ?about 1%, consistent with well-differentiated, grade 1 tumor.  ?  ?04/30/2020 Initial Diagnosis  ? 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  ?05/10/2020 -  Chemotherapy  ? First-line octreotide (Sandostatin) monthly starting 05/10/20. Held after cycle 1 due to suspected pericarditis and deconditioning.  ?         --Changed to monthly Lanreotide on 10/18/20.  ? ?  ?01/31/2021 Imaging  ? CT A/P ? ?IMPRESSION: ?1. Interval decrease in size of hypodense lesions of the inferior ?right  lobe of the liver and posterior liver dome findings are ?consistent with treatment response of metastatic lesions. ?2. No evidence of new metastatic disease in the abdomen or pelvis. ?3. Prostatomegaly. ?  ?07/29/2021 Imaging  ? EXAM: ?CT CHEST, ABDOMEN, AND PELVIS WITH  CONTRAST ? ?IMPRESSION: ?1. Slight interval increase in size of the 2 liver metastases. No ?new liver lesions evident. ?2. Stable mild lymphadenopathy in the central small bowel mesentery. ?3. 3 mm right upper lobe pulmona

## 2022-02-12 ENCOUNTER — Inpatient Hospital Stay: Payer: Medicare HMO | Attending: Nurse Practitioner

## 2022-02-12 ENCOUNTER — Other Ambulatory Visit: Payer: Self-pay

## 2022-02-12 VITALS — BP 132/58 | HR 84 | Temp 98.2°F | Resp 16

## 2022-02-12 DIAGNOSIS — Z7189 Other specified counseling: Secondary | ICD-10-CM

## 2022-02-12 DIAGNOSIS — C7B02 Secondary carcinoid tumors of liver: Secondary | ICD-10-CM | POA: Diagnosis not present

## 2022-02-12 DIAGNOSIS — C7B8 Other secondary neuroendocrine tumors: Secondary | ICD-10-CM

## 2022-02-12 DIAGNOSIS — C7A00 Malignant carcinoid tumor of unspecified site: Secondary | ICD-10-CM | POA: Diagnosis not present

## 2022-02-12 MED ORDER — LANREOTIDE ACETATE 120 MG/0.5ML ~~LOC~~ SOLN
120.0000 mg | Freq: Once | SUBCUTANEOUS | Status: AC
  Administered 2022-02-12: 120 mg via SUBCUTANEOUS
  Filled 2022-02-12: qty 120

## 2022-02-12 NOTE — Patient Instructions (Signed)
Lanreotide injection What is this medication? LANREOTIDE (lan REE oh tide) is used to reduce blood levels of growth hormone in patients with a condition called acromegaly. It also works to slow or stop tumor growth in patients with neuroendocrine tumors and treat carcinoid syndrome. This medicine may be used for other purposes; ask your health care provider or pharmacist if you have questions. COMMON BRAND NAME(S): Somatuline [place] What should I tell my care team before I take this medication? They need to know if you have any of these conditions: diabetes gallbladder disease heart disease kidney disease liver disease thyroid disease an unusual or allergic reaction to lanreotide, other medicines, foods, dyes, or preservatives pregnant or trying to get pregnant breast-feeding How should I use this medication? This medicine is for injection under the skin. It is given by a health care professional in a hospital or clinic setting. Contact your pediatrician or health care professional regarding the use of this medicine in children. Special care may be needed. Overdosage: If you think you have taken too much of this medicine contact a poison control center or emergency room at once. NOTE: This medicine is only for you. Do not share this medicine with others. What if I miss a dose? It is important not to miss your dose. Call your doctor or health care professional if you are unable to keep an appointment. What may interact with this medication? This medicine may interact with the following medications: bromocriptine cyclosporine certain medicines for blood pressure, heart disease, irregular heart beat certain medicines for diabetes quinidine terfenadine This list may not describe all possible interactions. Give your health care provider a list of all the medicines, herbs, non-prescription drugs, or dietary supplements you use. Also tell them if you smoke, drink alcohol, or use illegal drugs.  Some items may interact with your medicine. What should I watch for while using this medication? Tell your doctor or healthcare professional if your symptoms do not start to get better or if they get worse. Visit your doctor or health care professional for regular checks on your progress. Your condition will be monitored carefully while you are receiving this medicine. This medicine may increase blood sugar. Ask your healthcare provider if changes in diet or medicines are needed if you have diabetes. You may need blood work done while you are taking this medicine. Women should inform their doctor if they wish to become pregnant or think they might be pregnant. There is a potential for serious side effects to an unborn child. Talk to your health care professional or pharmacist for more information. Do not breast-feed an infant while taking this medicine or for 6 months after stopping it. This medicine has caused ovarian failure in some women. This medicine may interfere with the ability to have a child. Talk with your doctor or health care professional if you are concerned about your fertility. What side effects may I notice from receiving this medication? Side effects that you should report to your doctor or health care professional as soon as possible: allergic reactions like skin rash, itching or hives, swelling of the face, lips, or tongue increased blood pressure severe stomach pain signs and symptoms of hgh blood sugar such as being more thirsty or hungry or having to urinate more than normal. You may also feel very tired or have blurry vision. signs and symptoms of low blood sugar such as feeling anxious; confusion; dizziness; increased hunger; unusually weak or tired; sweating; shakiness; cold; irritable; headache; blurred vision; fast   heartbeat; loss of consciousness unusually slow heartbeat Side effects that usually do not require medical attention (report to your doctor or health care  professional if they continue or are bothersome): constipation diarrhea dizziness headache muscle pain muscle spasms nausea pain, redness, or irritation at site where injected This list may not describe all possible side effects. Call your doctor for medical advice about side effects. You may report side effects to FDA at 1-800-FDA-1088. Where should I keep my medication? This drug is given in a hospital or clinic and will not be stored at home. NOTE: This sheet is a summary. It may not cover all possible information. If you have questions about this medicine, talk to your doctor, pharmacist, or health care provider.  2023 Elsevier/Gold Standard (2018-07-07 00:00:00)  

## 2022-02-17 DIAGNOSIS — C61 Malignant neoplasm of prostate: Secondary | ICD-10-CM | POA: Diagnosis not present

## 2022-02-24 DIAGNOSIS — R3915 Urgency of urination: Secondary | ICD-10-CM | POA: Diagnosis not present

## 2022-02-24 DIAGNOSIS — C7A019 Malignant carcinoid tumor of the small intestine, unspecified portion: Secondary | ICD-10-CM | POA: Diagnosis not present

## 2022-02-24 DIAGNOSIS — C61 Malignant neoplasm of prostate: Secondary | ICD-10-CM | POA: Diagnosis not present

## 2022-03-12 ENCOUNTER — Inpatient Hospital Stay: Payer: Medicare HMO | Attending: Nurse Practitioner

## 2022-03-12 ENCOUNTER — Inpatient Hospital Stay: Payer: Medicare HMO | Admitting: Hematology

## 2022-03-12 ENCOUNTER — Encounter: Payer: Self-pay | Admitting: Hematology

## 2022-03-12 ENCOUNTER — Other Ambulatory Visit: Payer: Self-pay

## 2022-03-12 VITALS — BP 105/59 | HR 91 | Temp 98.4°F | Resp 18 | Ht 68.0 in | Wt 149.7 lb

## 2022-03-12 DIAGNOSIS — Z7189 Other specified counseling: Secondary | ICD-10-CM

## 2022-03-12 DIAGNOSIS — C7A00 Malignant carcinoid tumor of unspecified site: Secondary | ICD-10-CM | POA: Insufficient documentation

## 2022-03-12 DIAGNOSIS — Z7902 Long term (current) use of antithrombotics/antiplatelets: Secondary | ICD-10-CM | POA: Diagnosis not present

## 2022-03-12 DIAGNOSIS — Z8042 Family history of malignant neoplasm of prostate: Secondary | ICD-10-CM | POA: Insufficient documentation

## 2022-03-12 DIAGNOSIS — K219 Gastro-esophageal reflux disease without esophagitis: Secondary | ICD-10-CM | POA: Insufficient documentation

## 2022-03-12 DIAGNOSIS — C7B8 Other secondary neuroendocrine tumors: Secondary | ICD-10-CM

## 2022-03-12 DIAGNOSIS — Z79899 Other long term (current) drug therapy: Secondary | ICD-10-CM | POA: Diagnosis not present

## 2022-03-12 DIAGNOSIS — Z8546 Personal history of malignant neoplasm of prostate: Secondary | ICD-10-CM | POA: Insufficient documentation

## 2022-03-12 DIAGNOSIS — Z803 Family history of malignant neoplasm of breast: Secondary | ICD-10-CM | POA: Insufficient documentation

## 2022-03-12 DIAGNOSIS — I69354 Hemiplegia and hemiparesis following cerebral infarction affecting left non-dominant side: Secondary | ICD-10-CM | POA: Insufficient documentation

## 2022-03-12 DIAGNOSIS — Z923 Personal history of irradiation: Secondary | ICD-10-CM | POA: Diagnosis not present

## 2022-03-12 DIAGNOSIS — I10 Essential (primary) hypertension: Secondary | ICD-10-CM | POA: Diagnosis not present

## 2022-03-12 DIAGNOSIS — C7B02 Secondary carcinoid tumors of liver: Secondary | ICD-10-CM | POA: Diagnosis not present

## 2022-03-12 MED ORDER — LANREOTIDE ACETATE 120 MG/0.5ML ~~LOC~~ SOLN
120.0000 mg | Freq: Once | SUBCUTANEOUS | Status: AC
  Administered 2022-03-12: 120 mg via SUBCUTANEOUS
  Filled 2022-03-12: qty 120

## 2022-03-12 NOTE — Progress Notes (Signed)
[place]   Telephone:([phone]) [phone] Fax:(336) [phone]   Clinic Follow up Note   Patient Care Team: Alroy Dust, L.Marlou Sa, MD as PCP - General (Family Medicine) Burnell Blanks, MD as PCP - Cardiology (Cardiology) Jonnie Finner, RN (Inactive) as Oncology Nurse Navigator Truitt Merle, MD as Consulting Physician (Oncology) Alla Feeling, NP as Nurse Practitioner (Nurse Practitioner) Alexis Frock, MD as Consulting Physician (Urology) Tyler Pita, MD as Consulting Physician (Radiation Oncology)  Date of Service:  03/12/2022  CHIEF COMPLAINT: f/u of metastatic neuroendocrine tumor  CURRENT THERAPY:  Lanreotide, monthly, since 10/18/20  ASSESSMENT & PLAN:  Ryan Wilkerson is a 76 y.o. male with   1.  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 PET dotatate on 04/09/2020 showed intense uptake in the central mesenteric mass with 3 well differentiated liver metastases and small peritoneal/nodal metastatic deposits along the left iliac vessels. There is no primary bowel lesion identified.   -His 04/30/20 liver biopsy showed metastatic well differentiated/G1 neuroendocrine tumor to the liver.  IHC stains positive for synaptophysin, chromogranin, CD56 and CDX2 and negative for TTF-1-1.  Overall consistent with a GI primary.  Ki-67 of 1%.  -abdomen MRI on 05/16/20 ruled out pancreas as the primary site -He began first line Sandostatin 20 mg on 05/10/20. However on 05/12/20 he had pericarditis and treatment was held. I changed him to Lanreotide 136m every 4 weeks starting 10/18/20 -of note, colonoscopy on 04/15/21 at [facility] showed mild diverticulosis and internal hemorrhoids. Recall due in 10 years. -restaging CT CAP on 01/13/22 showed: stable soft tissue lesion within ileum; stable to mild increase in mesenteric and left  pelvic nodes; stable liver lesions.  Overall stable disease.   -he is clinically doing well, will continue lanreotide injection -Follow-up in 3 months, plan to repeat CT scan in November  2. Thoracic aortic aneurysm -measured 4 cm on recent CT CAP on 07/29/21, increased from 3.5 cm in 05/2020. -followed by Dr. MAngelena Form last seen 08/06/21. They plan for repeat CT in a year, and he was given lifting restrictions.   3. History of pericarditis -pericarditis occurred 2 days after first octreotide on 05/10/20.  -ED 05/12/2020 found to have inferior STE, echo showed preserved LV function and small pericardial effusion. CTA negative for aortic dissection. Urgent cath showed normal coronaries -recovered well   4. 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 MTammi Klippel-PSA down to 1.8 on 08/2018, but is now 16.7 as of 06/2021 -CT A/P on 01/13/22 showed no prostate concerns. -[facility], most recently seen 07/09/21. He has opted to continue active surveillance.   5. Family history/genetics -He has personal and strong family history of cancers, including 7 brothers with prostate cancer and a sister with breast cancer -His 10/28/17 genetic testing was negative.   6. 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   SUMMARY  OF ONCOLOGIC HISTORY: Oncology History Overview Note  Cancer Staging No matching staging information was found for the patient.    Malignant neoplasm  of prostate ([place])  08/31/2017 Initial Diagnosis   Malignant neoplasm of prostate ([place])   10/28/2017 Genetic Testing   The patient had genetic testing due to a personal history of prostate cancer and a family history of breast and stomach cancer.  The Common Hereditary Cancers Panel + Prostate Cancer Panel was ordered.  APC, ATM, AXIN2, BARD1, BMPR1A, BRCA1, BRCA2, BRIP1, CDH1, CDK4, CDKN2A (p14ARF), CDKN2A (p16INK4a), CHEK2, CTNNA1, DICER1, EPCAM*, FANCA, GREM1*, KIT, MEN1, MLH1, MSH2, MSH3, MSH6, MUTYH, NBN, NF1, PALB2, PDGFRA, PMS2, POLD1, POLE, PTEN, RAD50, RAD51C, RAD51D, SDHB, SDHC, SDHD, SMAD4, SMARCA4, STK11, TP53, TSC1, TSC2, VHL. The following genes were evaluated for sequence changes only: HOXB13*, NTHL1*, SDHA.   Results: Negative, no pathogenic variants identified. The date of this test report is 10/28/2017.    01/05/2018 - 02/12/2018 Radiation Therapy   S/p radiation to the prostate 70 Gy in 28 fractions from 01/05/2018-02/12/2018 per Dr. Tammi Klippel   01/31/2021 Imaging   CT A/P  IMPRESSION: 1. Interval decrease in size of hypodense lesions of the inferior right lobe of the liver and posterior liver dome findings are consistent with treatment response of metastatic lesions. 2. No evidence of new metastatic disease in the abdomen or pelvis. 3. Prostatomegaly.   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  Upper normal 9 mm short axis lymph node in the adjacent mesentery 2.  1.6 x 1.4 cm heterogeneous, poorly defined lesion in the inferior right liver suspicious for metastatic disease, the differential includes GI primary or metastatic prostate cancer No retroperitoneal or pelvic sidewall lymphadenopathy   04/01/2020 PET scan    IMPRESSION: 1. Central mesenteric mass with intense radiotracer activity consistent well differentiated neuroendocrine tumor 2. 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 3. Small peritoneal/nodal metastasis along the LEFT iliac vessels. 4. Very small lesion in the pancreas with intermediate activity could represent primary lesion. No primary bowel lesion identified. 5. Small metastatic implant within the pericardium adjacent LEFT heart ventricle. 6. No skeletal metastasis.   04/30/2020 Pathology Results   FINAL MICROSCOPIC DIAGNOSIS:  A. 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  Tumor cells are negative  for TTF-1.  The findings are consistent with neuroendocrine tumor of  gastrointestinal origin.  Ki-67 stain shows a proliferative index of  about 1%, consistent with well-differentiated, grade 1 tumor.    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 Held after cycle 1 due to suspected pericarditis and deconditioning.           --Changed to monthly Lanreotide on 10/18/20.     01/31/2021 Imaging   CT A/P  IMPRESSION: 1. Interval decrease in size of hypodense lesions of the inferior right lobe of the liver and posterior liver dome findings are consistent with treatment response of metastatic lesions. 2. No evidence of new metastatic disease in the abdomen or pelvis. 3. Prostatomegaly.   07/29/2021 Imaging  EXAM: CT CHEST, ABDOMEN, AND PELVIS WITH CONTRAST  IMPRESSION: 1. Slight interval increase in size of the 2 liver metastases. No new liver lesions evident. 2. Stable mild lymphadenopathy in the central small bowel mesentery. 3. 3 mm right upper  lobe pulmonary nodule. Attention on follow-up recommended. 4. 4 cm ascending thoracic aortic aneurysm, increased from 3.5 cm on 05/12/2020. Recommend annual imaging followup by CTA or MRA. This recommendation follows 2010 ACCF/AHA/AATS/ACR/ASA/SCA/SCAI/SIR/STS/SVM Guidelines for the Diagnosis and Management of Patients with Thoracic Aortic Disease. Circulation. 2010; 121: O270-J500. Aortic aneurysm NOS (ICD10-I71.9)  5. Aortic Atherosclerosis (ICD10-I70.0).      INTERVAL HISTORY:  Ryan Wilkerson is here for a follow up of metastatic NET.  He is doing well overall Constipation is controlled with Miralax  Occasional abdominal cramps but resolves after eating  Normal appetite, weight is stable  Still has mild fatigue and dyspnea on exertion, but overall mild   All other systems were reviewed with the patient and are negative.  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 [facility])    Nocturia    Pericardial effusion    Pericarditis    Prostate cancer [facility]) urologist-- dr Tresa Moore /  oncologist-- dr manning   prostate bx 06-24-2016 and 09-15-2017 at dr Tresa Moore office  Stage T1c, Gleason 4+3, PSA 8.48, vol 33cc---  planned external beam radiation therpay   Tobacco abuse    Urgency of urination    Wears glasses     SURGICAL HISTORY: Past Surgical History:  Procedure Laterality Date   COLONOSCOPY  last one 06/ 2018   GOLD SEED IMPLANT N/A 12/23/2017   Procedure: GOLD SEED IMPLANT;  Surgeon: Alexis Frock, MD;  Location: [facility];  Service: Urology;   Laterality: N/A;   LEFT HEART CATH AND CORONARY ANGIOGRAPHY N/A 05/12/2020   Procedure: LEFT HEART CATH AND CORONARY ANGIOGRAPHY;  Surgeon: Burnell Blanks, MD;  Location: [place] [facility];  Service: Cardiovascular;  Laterality: N/A;   PROSTATE BIOPSY  06-24-2016;  09-15-2017-- at dr Tresa Moore office   [place] N/A 12/23/2017   Procedure: [place];  Surgeon: Alexis Frock, MD;  Location: [facility];  Service: Urology;  Laterality: N/A;    I have reviewed the social history and family history with the patient and they are unchanged from previous note.  ALLERGIES:  is allergic to aspirin, primidone, and simvastatin.  MEDICATIONS:  Current Outpatient Medications  Medication Sig Dispense Refill   acetaminophen (TYLENOL) 500 MG tablet Take 1,000 mg by mouth every 6 (six) hours as needed for headache (pain).      clopidogrel (PLAVIX) 75 MG tablet Take 75 mg by mouth daily.      dicyclomine (BENTYL) 10 MG capsule Take 1 capsule (10 mg total) by mouth every 8 (eight) hours as needed for spasms. 20 capsule 0   fluticasone (FLONASE) 50 MCG/ACT nasal spray Place 2 sprays into both nostrils daily. 16 g 0   loratadine (CLARITIN) 10 MG tablet Take 10 mg by  mouth every morning.      OVER THE COUNTER MEDICATION Place 1 drop into both eyes daily as needed (dry eyes). Over the counter lubricating eye drop      pantoprazole (PROTONIX) 40 MG tablet Take 1 tablet (40 mg total) by mouth daily. 30 tablet 0   polyethylene glycol (MIRALAX / GLYCOLAX) 17 g packet Take 17 g by mouth 2 (two) times daily. 14 each 0   SYMBICORT 160-4.5 MCG/ACT inhaler Inhale 1-2 puffs into the lungs as needed.     traMADol (ULTRAM) 50 MG tablet Take 1-2 tablets (50-100 mg total) by mouth every 6 (six) hours as needed for moderate pain or severe pain. 30 tablet 0   No current facility-administered medications for this visit.    PHYSICAL EXAMINATION: ECOG PERFORMANCE STATUS: 1 -  Symptomatic but completely ambulatory  Vitals:   03/12/22 1307  BP: (!) 105/59  Pulse: 91  Resp: 18  Temp: 98.4 F (36.9 C)  SpO2: 100%    Wt Readings from Last 3 Encounters:  03/12/22 149 lb 11.2 oz (67.9 kg)  01/15/22 146 lb 12.8 oz (66.6 kg)  09/25/21 152 lb 3.2 oz (69 kg)     GENERAL:alert, no distress and comfortable SKIN: skin color normal, no rashes or significant lesions EYES: normal, Conjunctiva are pink and non-injected, sclera clear  NEURO: alert & oriented x 3 with fluent speech  LABORATORY DATA:  I have reviewed the data as listed    Latest Ref Rng & Units 01/13/2022    2:37 PM 12/18/2021    1:22 PM 11/20/2021    1:25 PM  CBC  WBC 4.0 - 10.5 K/uL 5.9  5.2  5.7   Hemoglobin 13.0 - 17.0 g/dL 12.1  11.5  12.5   Hematocrit 39.0 - 52.0 % 37.2  35.0  39.1   Platelets 150 - 400 K/uL 212  191  216         Latest Ref Rng & Units 01/13/2022    2:37 PM 12/18/2021    1:22 PM 11/20/2021    1:25 PM  CMP  Glucose 70 - 99 mg/dL 95  161  114   BUN 8 - 23 mg/dL 12  15  15    Creatinine 0.61 - 1.24 mg/dL 1.46  1.32  1.21   Sodium 135 - 145 mmol/L 137  137  137   Potassium 3.5 - 5.1 mmol/L 4.3  3.9  4.1   Chloride 98 - 111 mmol/L 101  103  102   CO2 22 - 32 mmol/L 32  30  30   Calcium 8.9 - 10.3 mg/dL 9.0  8.7  9.2   Total Protein 6.5 - 8.1 g/dL 6.7  6.3  6.8   Total Bilirubin 0.3 - 1.2 mg/dL 0.5  0.5  0.5   Alkaline Phos 38 - 126 U/L 56  51  57   AST 15 - 41 U/L 24  19  19    ALT 0 - 44 U/L 19  17  18        RADIOGRAPHIC STUDIES: I have personally reviewed the radiological images as listed and agreed with the findings in the report. No results found.    No orders of the defined types were placed in this encounter.  All questions were answered. The patient knows to call the clinic with any problems, questions or concerns. No barriers to learning was detected. The total time spent in the appointment was 25 minutes.     Truitt Merle, MD 03/12/2022

## 2022-03-13 ENCOUNTER — Telehealth: Payer: Self-pay | Admitting: Hematology

## 2022-03-13 NOTE — Telephone Encounter (Signed)
Scheduled follow-up appointments per 7/12 los. Patient is aware.

## 2022-04-09 ENCOUNTER — Inpatient Hospital Stay: Payer: Medicare HMO | Attending: Nurse Practitioner

## 2022-04-09 ENCOUNTER — Other Ambulatory Visit: Payer: Self-pay

## 2022-04-09 VITALS — BP 122/55 | HR 78 | Temp 98.9°F | Resp 18

## 2022-04-09 DIAGNOSIS — C7A00 Malignant carcinoid tumor of unspecified site: Secondary | ICD-10-CM | POA: Insufficient documentation

## 2022-04-09 DIAGNOSIS — Z7189 Other specified counseling: Secondary | ICD-10-CM

## 2022-04-09 DIAGNOSIS — C7B02 Secondary carcinoid tumors of liver: Secondary | ICD-10-CM | POA: Insufficient documentation

## 2022-04-09 DIAGNOSIS — C7B8 Other secondary neuroendocrine tumors: Secondary | ICD-10-CM

## 2022-04-09 MED ORDER — LANREOTIDE ACETATE 120 MG/0.5ML ~~LOC~~ SOLN
120.0000 mg | Freq: Once | SUBCUTANEOUS | Status: AC
  Administered 2022-04-09: 120 mg via SUBCUTANEOUS
  Filled 2022-04-09: qty 120

## 2022-05-07 ENCOUNTER — Other Ambulatory Visit: Payer: Self-pay

## 2022-05-07 ENCOUNTER — Inpatient Hospital Stay: Payer: Medicare HMO | Attending: Nurse Practitioner

## 2022-05-07 ENCOUNTER — Inpatient Hospital Stay: Payer: Medicare HMO

## 2022-05-07 VITALS — BP 130/65 | HR 65 | Temp 98.6°F | Resp 18

## 2022-05-07 DIAGNOSIS — C7B8 Other secondary neuroendocrine tumors: Secondary | ICD-10-CM

## 2022-05-07 DIAGNOSIS — C7A00 Malignant carcinoid tumor of unspecified site: Secondary | ICD-10-CM | POA: Diagnosis not present

## 2022-05-07 DIAGNOSIS — C7B02 Secondary carcinoid tumors of liver: Secondary | ICD-10-CM | POA: Insufficient documentation

## 2022-05-07 DIAGNOSIS — Z7189 Other specified counseling: Secondary | ICD-10-CM

## 2022-05-07 LAB — CMP (CANCER CENTER ONLY)
ALT: 22 U/L (ref 0–44)
AST: 31 U/L (ref 15–41)
Albumin: 3.7 g/dL (ref 3.5–5.0)
Alkaline Phosphatase: 47 U/L (ref 38–126)
Anion gap: 5 (ref 5–15)
BUN: 13 mg/dL (ref 8–23)
CO2: 29 mmol/L (ref 22–32)
Calcium: 9.2 mg/dL (ref 8.9–10.3)
Chloride: 105 mmol/L (ref 98–111)
Creatinine: 1.43 mg/dL — ABNORMAL HIGH (ref 0.61–1.24)
GFR, Estimated: 51 mL/min — ABNORMAL LOW (ref >60)
Glucose, Bld: 103 mg/dL — ABNORMAL HIGH (ref 70–99)
Potassium: 3.8 mmol/L (ref 3.5–5.1)
Sodium: 139 mmol/L (ref 135–145)
Total Bilirubin: 0.4 mg/dL (ref 0.3–1.2)
Total Protein: 6.8 g/dL (ref 6.5–8.1)

## 2022-05-07 LAB — CBC WITH DIFFERENTIAL (CANCER CENTER ONLY)
Abs Immature Granulocytes: 0.01 10*3/uL (ref 0.00–0.07)
Basophils Absolute: 0 10*3/uL (ref 0.0–0.1)
Basophils Relative: 1 %
Eosinophils Absolute: 0.3 10*3/uL (ref 0.0–0.5)
Eosinophils Relative: 6 %
HCT: 35.4 % — ABNORMAL LOW (ref 39.0–52.0)
Hemoglobin: 11.8 g/dL — ABNORMAL LOW (ref 13.0–17.0)
Immature Granulocytes: 0 %
Lymphocytes Relative: 29 %
Lymphs Abs: 1.5 10*3/uL (ref 0.7–4.0)
MCH: 30.3 pg (ref 26.0–34.0)
MCHC: 33.3 g/dL (ref 30.0–36.0)
MCV: 90.8 fL (ref 80.0–100.0)
Monocytes Absolute: 0.5 10*3/uL (ref 0.1–1.0)
Monocytes Relative: 9 %
Neutro Abs: 2.9 10*3/uL (ref 1.7–7.7)
Neutrophils Relative %: 55 %
Platelet Count: 189 10*3/uL (ref 150–400)
RBC: 3.9 MIL/uL — ABNORMAL LOW (ref 4.22–5.81)
RDW: 11.8 % (ref 11.5–15.5)
WBC Count: 5.2 10*3/uL (ref 4.0–10.5)
nRBC: 0 % (ref 0.0–0.2)

## 2022-05-07 MED ORDER — LANREOTIDE ACETATE 120 MG/0.5ML ~~LOC~~ SOLN
120.0000 mg | Freq: Once | SUBCUTANEOUS | Status: AC
  Administered 2022-05-07: 120 mg via SUBCUTANEOUS
  Filled 2022-05-07: qty 120

## 2022-05-27 DIAGNOSIS — C61 Malignant neoplasm of prostate: Secondary | ICD-10-CM | POA: Diagnosis not present

## 2022-05-28 ENCOUNTER — Other Ambulatory Visit (HOSPITAL_COMMUNITY): Payer: Self-pay | Admitting: Urology

## 2022-05-28 DIAGNOSIS — C61 Malignant neoplasm of prostate: Secondary | ICD-10-CM

## 2022-06-03 DIAGNOSIS — R3915 Urgency of urination: Secondary | ICD-10-CM | POA: Diagnosis not present

## 2022-06-03 DIAGNOSIS — C61 Malignant neoplasm of prostate: Secondary | ICD-10-CM | POA: Diagnosis not present

## 2022-06-04 ENCOUNTER — Inpatient Hospital Stay: Payer: Medicare HMO | Admitting: Hematology

## 2022-06-04 ENCOUNTER — Inpatient Hospital Stay: Payer: Medicare HMO

## 2022-06-04 ENCOUNTER — Encounter: Payer: Self-pay | Admitting: Hematology

## 2022-06-04 ENCOUNTER — Other Ambulatory Visit: Payer: Self-pay

## 2022-06-04 DIAGNOSIS — C7B8 Other secondary neuroendocrine tumors: Secondary | ICD-10-CM

## 2022-06-05 ENCOUNTER — Inpatient Hospital Stay: Payer: Medicare HMO | Admitting: Nurse Practitioner

## 2022-06-05 ENCOUNTER — Inpatient Hospital Stay: Payer: Medicare HMO | Attending: Nurse Practitioner

## 2022-06-05 ENCOUNTER — Encounter: Payer: Self-pay | Admitting: Nurse Practitioner

## 2022-06-05 ENCOUNTER — Other Ambulatory Visit: Payer: Self-pay

## 2022-06-05 VITALS — BP 136/75 | HR 79 | Temp 98.5°F | Resp 15 | Wt 151.1 lb

## 2022-06-05 DIAGNOSIS — Z7902 Long term (current) use of antithrombotics/antiplatelets: Secondary | ICD-10-CM | POA: Diagnosis not present

## 2022-06-05 DIAGNOSIS — C7A00 Malignant carcinoid tumor of unspecified site: Secondary | ICD-10-CM | POA: Insufficient documentation

## 2022-06-05 DIAGNOSIS — Z79899 Other long term (current) drug therapy: Secondary | ICD-10-CM | POA: Insufficient documentation

## 2022-06-05 DIAGNOSIS — C7B8 Other secondary neuroendocrine tumors: Secondary | ICD-10-CM

## 2022-06-05 DIAGNOSIS — Z7189 Other specified counseling: Secondary | ICD-10-CM

## 2022-06-05 DIAGNOSIS — C61 Malignant neoplasm of prostate: Secondary | ICD-10-CM | POA: Diagnosis not present

## 2022-06-05 DIAGNOSIS — Z923 Personal history of irradiation: Secondary | ICD-10-CM | POA: Diagnosis not present

## 2022-06-05 DIAGNOSIS — Z7951 Long term (current) use of inhaled steroids: Secondary | ICD-10-CM | POA: Insufficient documentation

## 2022-06-05 DIAGNOSIS — I10 Essential (primary) hypertension: Secondary | ICD-10-CM | POA: Diagnosis not present

## 2022-06-05 DIAGNOSIS — C7B02 Secondary carcinoid tumors of liver: Secondary | ICD-10-CM | POA: Insufficient documentation

## 2022-06-05 MED ORDER — LANREOTIDE ACETATE 120 MG/0.5ML ~~LOC~~ SOLN
120.0000 mg | Freq: Once | SUBCUTANEOUS | Status: AC
  Administered 2022-06-05: 120 mg via SUBCUTANEOUS
  Filled 2022-06-05: qty 120

## 2022-06-05 NOTE — Progress Notes (Signed)
[place]   [phone]) [phone] [phone]) [phone]   Clinic Follow up Note   Patient Care Team: Alroy Dust, L.Marlou Sa, MD as PCP - General (Family Medicine) Burnell Blanks, MD as PCP - Cardiology (Cardiology) Jonnie Finner, RN (Inactive) as Oncology Nurse Navigator Truitt Merle, MD as Consulting Physician (Oncology) Alla Feeling, NP as Nurse Practitioner (Nurse Practitioner) Alexis Frock, MD as Consulting Physician (Urology) Tyler Pita, MD as Consulting Physician (Radiation Oncology) 06/05/2022  CHIEF COMPLAINT: Follow-up metastatic neuroendocrine tumor  SUMMARY OF ONCOLOGIC HISTORY: Oncology History Overview Note  Cancer Staging No matching staging information was found for the patient.    Malignant neoplasm of prostate ([place])  08/31/2017 Initial Diagnosis   Malignant neoplasm of prostate ([place])   10/28/2017 Genetic Testing   The patient had genetic testing due to a personal history of prostate cancer and a family history of breast and stomach cancer.  The Common Hereditary Cancers Panel + Prostate Cancer Panel was ordered.  APC, ATM, AXIN2, BARD1, BMPR1A, BRCA1, BRCA2, BRIP1, CDH1, CDK4, CDKN2A (p14ARF), CDKN2A (p16INK4a), CHEK2, CTNNA1, DICER1, EPCAM*, FANCA, GREM1*, KIT, MEN1, MLH1, MSH2, MSH3, MSH6, MUTYH, NBN, NF1, PALB2, PDGFRA, PMS2, POLD1, POLE, PTEN, RAD50, RAD51C, RAD51D, SDHB, SDHC, SDHD, SMAD4, SMARCA4, STK11, TP53, TSC1, TSC2, VHL. The following genes were evaluated for sequence changes only: HOXB13*, NTHL1*, SDHA.   Results: Negative, no pathogenic variants identified. The date of this test report is 10/28/2017.    01/05/2018 - 02/12/2018 Radiation Therapy   S/p radiation to the prostate 70 Gy in 28 fractions from 01/05/2018-02/12/2018 per Dr. Tammi Klippel   01/31/2021 Imaging   CT A/P  IMPRESSION: 1. Interval decrease in size of hypodense lesions of the inferior right lobe of the liver and posterior liver dome findings are consistent with  treatment response of metastatic lesions. 2. No evidence of new metastatic disease in the abdomen or pelvis. 3. Prostatomegaly.   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  Upper normal 9 mm short axis lymph node in the adjacent mesentery 2.  1.6 x 1.4 cm heterogeneous, poorly defined lesion in the inferior right liver suspicious for metastatic disease, the differential includes GI primary or metastatic prostate cancer No retroperitoneal or pelvic sidewall lymphadenopathy   04/01/2020 PET scan   IMPRESSION: 1. Central mesenteric mass with intense radiotracer activity consistent well differentiated neuroendocrine tumor 2. 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 3. Small peritoneal/nodal metastasis along the LEFT iliac vessels. 4. Very small lesion in the pancreas with intermediate activity could represent primary lesion. No primary bowel lesion identified. 5. Small metastatic implant within the pericardium adjacent LEFT heart ventricle. 6. No skeletal metastasis.   04/30/2020 Pathology Results   FINAL MICROSCOPIC DIAGNOSIS:  A. 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  Tumor cells are negative  for TTF-1.  The findings are consistent with neuroendocrine tumor of  gastrointestinal origin.  Ki-67 stain shows a proliferative index of  about 1%, consistent with well-differentiated, grade 1 tumor.    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 Held after cycle 1 due to suspected pericarditis and deconditioning.           --Changed to monthly Lanreotide on 10/18/20.     01/31/2021 Imaging   CT A/P  IMPRESSION: 1. Interval decrease in size of hypodense lesions of the inferior right lobe of the liver and posterior liver dome findings are consistent with treatment response of metastatic lesions. 2. No evidence of new metastatic disease in the abdomen or pelvis. 3. Prostatomegaly.   07/29/2021 Imaging   EXAM: CT CHEST, ABDOMEN, AND PELVIS WITH CONTRAST  IMPRESSION: 1. Slight interval increase in size of the 2 liver metastases. No new liver lesions evident. 2. Stable mild lymphadenopathy in the central small bowel mesentery. 3. 3 mm right upper lobe pulmonary nodule. Attention on follow-up recommended. 4. 4 cm ascending thoracic aortic aneurysm, increased from 3.5 cm on 05/12/2020. Recommend annual imaging followup by CTA or MRA. This recommendation follows 2010 ACCF/AHA/AATS/ACR/ASA/SCA/SCAI/SIR/STS/SVM Guidelines for the Diagnosis and Management of Patients with Thoracic Aortic Disease. Circulation. 2010; 121: D532-D924. Aortic aneurysm NOS (ICD10-I71.9)  5. Aortic Atherosclerosis (ICD10-I70.0).     CURRENT THERAPY: Lanreotide, monthly, since 10/18/20  INTERVAL HISTORY: Mr. Wiedeman returns for follow-up as scheduled, last seen by Dr. Burr Medico 03/12/2022.  Continues monthly lanreotide injections, tolerating well without side effects. He manages constipation with miralax. He has a constant pain in the RUQ for 1 year, brought on by certain foods. He relieves this by eating something else that "pushes it through." Separate from this he has mid abdominal cramping that is less frequent, 2 times per week or so, and stable on lanreotide. He has less flushing, no episodes in 3 weeks. Pain is managed with tramadol and bentyl. On another note his PSA has been slowly rising so he is scheduled for PET  scan tomorrow and f/up with Dr. Tresa Moore on Monday. Otherwise doing well. His wife had a stroke this summer and he is taking care of her.   All other systems were reviewed with the patient and are negative.  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 [facility])    Nocturia    Pericardial effusion    Pericarditis    Prostate cancer [facility]) urologist-- dr Tresa Moore /  oncologist-- dr manning   prostate bx 06-24-2016 and 09-15-2017 at dr Tresa Moore office  Stage T1c, Gleason 4+3, PSA 8.48, vol 33cc---  planned external beam radiation therpay   Tobacco abuse    Urgency of urination    Wears glasses     SURGICAL HISTORY: Past Surgical History:  Procedure Laterality Date   COLONOSCOPY  last one 06/ 2018   GOLD SEED IMPLANT N/A 12/23/2017   Procedure: GOLD SEED IMPLANT;  Surgeon: Alexis Frock, MD;  Location[facility];  Service: Urology;  Laterality: N/A;   LEFT HEART CATH AND CORONARY ANGIOGRAPHY N/A 05/12/2020   Procedure: LEFT HEART CATH AND CORONARY ANGIOGRAPHY;  Surgeon: Burnell Blanks, MD;  Location: [place] [facility];  Service: Cardiovascular;  Laterality: N/A;  PROSTATE BIOPSY  06-24-2016;  09-15-2017-- at dr Tresa Moore office   [place] N/A 12/23/2017   Procedure: SPACE OAR INSTILLATION;  Surgeon: Alexis Frock, MD;  Location: [facility];  Service: Urology;  Laterality: N/A;    I have reviewed the social history and family history with the patient and they are unchanged from previous note.  ALLERGIES:  is allergic to aspirin,  primidone, and simvastatin.  MEDICATIONS:  Current Outpatient Medications  Medication Sig Dispense Refill   acetaminophen (TYLENOL) 500 MG tablet Take 1,000 mg by mouth every 6 (six) hours as needed for headache (pain).      clopidogrel (PLAVIX) 75 MG tablet Take 75 mg by mouth daily.      dicyclomine (BENTYL) 10 MG capsule Take 1 capsule (10 mg total) by mouth every 8 (eight) hours as needed for spasms. 20 capsule 0   fluticasone (FLONASE) 50 MCG/ACT nasal spray Place 2 sprays into both nostrils daily. 16 g 0   loratadine (CLARITIN) 10 MG tablet Take 10 mg by mouth every morning.      OVER THE COUNTER MEDICATION Place 1 drop into both eyes daily as needed (dry eyes). Over the counter lubricating eye drop      pantoprazole (PROTONIX) 40 MG tablet Take 1 tablet (40 mg total) by mouth daily. 30 tablet 0   polyethylene glycol (MIRALAX / GLYCOLAX) 17 g packet Take 17 g by mouth 2 (two) times daily. 14 each 0   SYMBICORT 160-4.5 MCG/ACT inhaler Inhale 1-2 puffs into the lungs as needed.     traMADol (ULTRAM) 50 MG tablet Take 1-2 tablets (50-100 mg total) by mouth every 6 (six) hours as needed for moderate pain or severe pain. 30 tablet 0   No current facility-administered medications for this visit.    PHYSICAL EXAMINATION: ECOG PERFORMANCE STATUS: 1 - Symptomatic but completely ambulatory  Vitals:   06/05/22 1011  BP: 136/75  Pulse: 79  Resp: 15  Temp: 98.5 F (36.9 C)  SpO2: 99%   Filed Weights   06/05/22 1011  Weight: 151 lb 1.6 oz (68.5 kg)    GENERAL:alert, no distress and comfortable SKIN: No rash EYES: sclera clear LUNGS: clear with normal breathing effort HEART: regular rate & rhythm and no murmurs and no lower extremity edema ABDOMEN:abdomen soft, non-tender and normal bowel sounds Musculoskeletal:no cyanosis of digits and no clubbing  NEURO: alert & oriented x 3 with fluent speech, no focal motor/sensory deficits  LABORATORY DATA:  I have reviewed the data as  listed    Latest Ref Rng & Units 05/07/2022    1:56 PM 01/13/2022    2:37 PM 12/18/2021    1:22 PM  CBC  WBC 4.0 - 10.5 K/uL 5.2  5.9  5.2   Hemoglobin 13.0 - 17.0 g/dL 11.8  12.1  11.5   Hematocrit 39.0 - 52.0 % 35.4  37.2  35.0   Platelets 150 - 400 K/uL 189  212  191         Latest Ref Rng & Units 05/07/2022    1:56 PM 01/13/2022    2:37 PM 12/18/2021    1:22 PM  CMP  Glucose 70 - 99 mg/dL 103  95  161   BUN 8 - 23 mg/dL _0 Creatinine 0.61 - 1.24 mg/dL 1.43  1.46  1.32   Sodium 135 - 145 mmol/L 139  137  137   Potassium 3.5 - 5.1 mmol/L 3.8  4.3  3.9   Chloride 98 - 111 mmol/L 105  101  103   CO2 22 - 32 mmol/L 29  32  30   Calcium 8.9 - 10.3 mg/dL 9.2  9.0  8.7   Total Protein 6.5 - 8.1 g/dL 6.8  6.7  6.3   Total Bilirubin 0.3 - 1.2 mg/dL 0.4  0.5  0.5   Alkaline Phos 38 - 126 U/L 47  56  51   AST 15 - 41 U/L _0 ALT 0 - 44 U/L _1 RADIOGRAPHIC STUDIES: I have personally reviewed the radiological images as listed and agreed with the findings in the report. No results found.   ASSESSMENT & PLAN: 76 year old male with history of stroke, HTN, HL, GERD, prostate cancer with   1.  Well differentiated/G1 neuroendocrine tumor, consistent with GI primary with liver and nodal metastasis -He developed fatigue, mild weight loss, abdominal pain, and flushing over a year ago, after treatment for prostate cancer.  -due to rising PSA, CT/bone scan were obtained showing a mesenteric mass and single liver lesion. We do not have previous CT to compare.  -PET dotatate on 04/09/2020 showed intense uptake in the central mesenteric mass with 3 well differentiated liver metastases and small peritoneal/nodal metastatic deposits along the left iliac vessels.  There is no primary bowel lesion identified.  There is a very small lesion in the pancreas with indeterminate activity.  The primary site was still undetermined -Chromogranin A on 8/2 is normal -Liver biopsy from  04/30/2020 shows metastatic well differentiated/G1 neuroendocrine tumor to the liver.  IHC stains positive for synaptophysin, chromogranin, CD56 and CDX2 and negative for TTF-1-1.  Overall consistent with a GI primary.  Ki-67 of 1% -his case was discussed in tumor board, unfortunately this is metastatic disease and surgical resection is not an option.   -he began first line Sandostatin 30 mg on 05/10/20, goal is palliative/disease control, however on 9/11 he developed pericarditis and treatment was held -He began lanreotide 120 mg every 4 weeks starting 10/18/2020 -CT 01/13/2022 showed stable soft tissue lesion within the ileum, stable to have mild increase in mesenteric and left pelvic nodes, and stable liver lesions.  Overall stable disease -Mr. Segal appears stable.  He continues monthly lanreotide injections, tolerating well with mild injection site pain.  A chronic right upper quadrant pain is stable.  On lanreotide cramping is stable and flushing has improved, no clinical evidence of disease progression -Recent labs reviewed, proceed with lanreotide today and continue every 4 weeks -Follow-up in 3 months, or sooner if needed   2.  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 Tammi Klippel -PSA down to 1.8 on 08/2018, steady rise to 4.69 on 6/21 -CT/bone scan in 6/21 showed no obvious metastatic disease from prostate cancer but did reveal mesenteric mass and liver lesion ultimately biopsy-proven neuroendocrine tumor -Followed by [facility] Tresa [facility] at [facility]rology -Per patient due to rising PSA he is scheduled for PET scan tomorrow, 10/6 then follow-up with Dr. Tresa Moore 10/9.  I asked the patient to request that they send [place] the note  3.  History of pericarditis  -Occurred 2 days after first octreotide on 05/10/2020  -ED 05/12/2020 found to have inferior STE, echo showed preserved LV function and  small pericardial effusion -CTA negative for aortic dissection -urgent  cath showed normal coronaries -Follow-up cardiology   4.  Family history/genetics -He has personal and strong family history of cancers, including 7 brothers with prostate cancer, 1 of those has lung cancer also, a sister with lung cancer and another sister with breast cancer -Patient underwent genetic testing with [facility] Common hereditary cancer panel including the prostate cancer panel which was negative for pathogenic mutation or VUS per report dated 10/28/2017   5. 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 Tresa Moore then f/up urology 10/9, asked pt to request Dr. Tresa Moore to send [place] his note -If PET is stable, f/up in 3 months    All questions were answered. The patient knows to call the clinic with any problems, questions or concerns. No barriers to learning were detected.      Alla Feeling, NP 06/05/22

## 2022-06-06 ENCOUNTER — Encounter (HOSPITAL_COMMUNITY)
Admission: RE | Admit: 2022-06-06 | Discharge: 2022-06-06 | Disposition: A | Discharge status: Home / Self Care | Payer: Medicare HMO | Source: Ambulatory Visit | Attending: Urology | Admitting: Urology

## 2022-06-06 DIAGNOSIS — C61 Malignant neoplasm of prostate: Secondary | ICD-10-CM | POA: Diagnosis not present

## 2022-06-06 MED ORDER — PIFLIFOLASTAT F 18 (PYLARIFY) INJECTION
9.0000 | Freq: Once | INTRAVENOUS | Status: AC
  Administered 2022-06-06: 7.15 via INTRAVENOUS

## 2022-06-09 DIAGNOSIS — C61 Malignant neoplasm of prostate: Secondary | ICD-10-CM | POA: Diagnosis not present

## 2022-06-13 ENCOUNTER — Inpatient Hospital Stay: Payer: Medicare HMO | Admitting: Hematology

## 2022-06-13 ENCOUNTER — Inpatient Hospital Stay: Payer: Medicare HMO

## 2022-06-13 ENCOUNTER — Other Ambulatory Visit: Payer: Self-pay

## 2022-06-13 ENCOUNTER — Encounter: Payer: Self-pay | Admitting: Hematology

## 2022-06-13 VITALS — BP 137/70 | HR 83 | Temp 98.0°F | Resp 15 | Wt 152.2 lb

## 2022-06-13 DIAGNOSIS — Z923 Personal history of irradiation: Secondary | ICD-10-CM | POA: Diagnosis not present

## 2022-06-13 DIAGNOSIS — C61 Malignant neoplasm of prostate: Secondary | ICD-10-CM | POA: Diagnosis not present

## 2022-06-13 DIAGNOSIS — I10 Essential (primary) hypertension: Secondary | ICD-10-CM | POA: Diagnosis not present

## 2022-06-13 DIAGNOSIS — Z79899 Other long term (current) drug therapy: Secondary | ICD-10-CM | POA: Diagnosis not present

## 2022-06-13 DIAGNOSIS — C7B8 Other secondary neuroendocrine tumors: Secondary | ICD-10-CM

## 2022-06-13 DIAGNOSIS — Z7951 Long term (current) use of inhaled steroids: Secondary | ICD-10-CM | POA: Diagnosis not present

## 2022-06-13 DIAGNOSIS — C7B02 Secondary carcinoid tumors of liver: Secondary | ICD-10-CM | POA: Diagnosis not present

## 2022-06-13 DIAGNOSIS — Z7902 Long term (current) use of antithrombotics/antiplatelets: Secondary | ICD-10-CM | POA: Diagnosis not present

## 2022-06-13 DIAGNOSIS — C7A00 Malignant carcinoid tumor of unspecified site: Secondary | ICD-10-CM | POA: Diagnosis not present

## 2022-06-13 NOTE — Progress Notes (Signed)
[place]   [phone] [phone]   Clinic Follow up Note   Patient Care Team: Alroy Dust, L.Marlou Sa, MD as PCP - General (Family Medicine) Burnell Blanks, MD as PCP - Cardiology (Cardiology) Jonnie Finner, RN (Inactive) as Oncology Nurse Navigator Truitt Merle, MD as Consulting Physician (Oncology) Alla Feeling, NP as Nurse Practitioner (Nurse Practitioner) Alexis Frock, MD as Consulting Physician (Urology) Tyler Pita, MD as Consulting Physician (Radiation Oncology)  Date of Service:  06/13/2022  CHIEF COMPLAINT: f/u of metastatic neuroendocrine tumor  CURRENT THERAPY:  Lanreotide, monthly, since 10/18/20  ASSESSMENT & PLAN:  Ryan Wilkerson is a 76 y.o. male with   1.  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 DOTATATE PET on 04/09/20 showed intense uptake in the central mesenteric mass with 3 liver metastases and small peritoneal/nodal metastatic deposits along the left iliac vessels. There is no primary bowel lesion identified.   -His 04/30/20 liver biopsy showed metastatic well differentiated/G1 neuroendocrine tumor to the liver.  IHC stains positive for synaptophysin, chromogranin, CD56 and CDX2 and negative for TTF-1-1.  Overall consistent with a GI primary.  Ki-67 of 1%.  -abdomen MRI on 05/16/20 ruled out pancreas as the primary site -He began first line Sandostatin 20 mg on 05/10/20. However on 05/12/20 he had pericarditis and treatment was held. I changed him to Lanreotide 127m every 4 weeks starting 10/18/20 -of note, colonoscopy on 04/15/21 at [facility] showed mild diverticulosis and internal hemorrhoids. Recall due in 10 years. -restaging CT CAP on 01/13/22 showed overall stable disease.   -he is clinically doing well, will continue lanreotide injection -he is due for  repeat scan next month. I recommend obtaining DOTATATE PET, due to the mixed metastatic prostate cancer. I explained that this will show which lymph nodes are involved by his NET, whereas the PSMA PET shows those involved by his prostate cancer.   2.  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 MTammi Klippel-CT/bone scan in 6/21 showed no obvious metastatic disease from prostate cancer -[facility] -PSA has been rising since nadir of 1.8 in 08/2018, most recently 26.4 in 05/2022. CT AP 01/13/22 was overall negative in regards to prostate cancer. -PSMA PET scan on 06/06/22 showed intensely radiotracer-avid iliac and retroperitoneal nodes; no activity within prostate gland, evidence of metastatic adenopathy outside of abdomen/pelvis, visceral metastasis, or skeletal metastasis. I personally reviewed his PET scan images and discussed with him.  -pt reports his urologist started him on a medicine in anticipation of beginning ADT soon.   3.  History of pericarditis  -Occurred 2 days after first octreotide on 05/10/2020  -ED 05/12/2020 found to have inferior STE, echo showed preserved LV function and small pericardial effusion -CTA negative for aortic dissection -urgent cath showed normal coronaries -Follow-up cardiology   PLAN: -lanreotide injection in 3 and 8 weeks -lab and Cu-64 PET scan in 7 weeks -f/u in 8 weeks   No problem-specific Assessment & Plan notes found for this encounter.   SUMMARY OF ONCOLOGIC HISTORY: Oncology History Overview Note  Cancer Staging No matching staging information was found for the patient.    [place]  08/[place] Initial Diagnosis   Malignant neoplasm  of prostate ([place])   10/28/2017 Genetic Testing   The patient had genetic testing due to a personal history of prostate cancer and  a family history of breast and stomach cancer.  The Common Hereditary Cancers Panel + Prostate Cancer Panel was ordered.  APC, ATM, AXIN2, BARD1, BMPR1A, BRCA1, BRCA2, BRIP1, CDH1, CDK4, CDKN2A (p14ARF), CDKN2A (p16INK4a), CHEK2, CTNNA1, DICER1, EPCAM*, FANCA, GREM1*, KIT, MEN1, MLH1, MSH2, MSH3, MSH6, MUTYH, NBN, NF1, PALB2, PDGFRA, PMS2, POLD1, POLE, PTEN, RAD50, RAD51C, RAD51D, SDHB, SDHC, SDHD, SMAD4, SMARCA4, STK11, TP53, TSC1, TSC2, VHL. The following genes were evaluated for sequence changes only: HOXB13*, NTHL1*, SDHA.   Results: Negative, no pathogenic variants identified. The date of this test report is 10/28/2017.    01/05/2018 - 02/12/2018 Radiation Therapy   S/p radiation to the prostate 70 Gy in 28 fractions from 01/05/2018-02/12/2018 per Dr. Tammi Klippel   01/31/2021 Imaging   CT A/P  IMPRESSION: 1. Interval decrease in size of hypodense lesions of the inferior right lobe of the liver and posterior liver dome findings are consistent with treatment response of metastatic lesions. 2. No evidence of new metastatic disease in the abdomen or pelvis. 3. Prostatomegaly.   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  Upper normal 9 mm short axis lymph node in the adjacent mesentery 2.  1.6 x 1.4 cm heterogeneous, poorly defined lesion in the inferior right liver suspicious for metastatic disease, the differential includes GI primary or metastatic prostate cancer No retroperitoneal or pelvic sidewall lymphadenopathy   04/01/2020 PET scan   IMPRESSION: 1. Central mesenteric mass with intense radiotracer activity consistent well differentiated neuroendocrine tumor 2. Three well differentiated neuroendocrine tumor metastasis to  the liver. 3. Small peritoneal/nodal metastasis along the LEFT iliac vessels. 4. Very small lesion in the pancreas with intermediate activity could represent primary lesion. No primary bowel lesion identified. 5. Small metastatic implant within the pericardium adjacent LEFT heart ventricle. 6. No skeletal metastasis.   04/30/2020 Pathology Results   FINAL MICROSCOPIC DIAGNOSIS:  A. 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  Tumor cells are negative  for TTF-1.  The findings are consistent with neuroendocrine tumor of  gastrointestinal origin.  Ki-67 stain shows a proliferative index of  about 1%, consistent with well-differentiated, grade 1 tumor.    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 Held after cycle 1 due to suspected pericarditis and deconditioning.           --Changed to monthly Lanreotide on 10/18/20.     01/31/2021 Imaging   CT A/P  IMPRESSION: 1. Interval decrease in size of hypodense lesions of the inferior right lobe of the liver and posterior liver dome findings are consistent with treatment response of metastatic lesions. 2. No evidence of new metastatic disease in the abdomen or pelvis. 3. Prostatomegaly.   07/29/2021 Imaging   EXAM: CT CHEST, ABDOMEN, AND PELVIS WITH CONTRAST  IMPRESSION: 1. Slight interval increase in size of the 2 liver metastases. No new liver lesions evident. 2. Stable mild lymphadenopathy in the central small bowel  mesentery. 3. 3 mm right upper lobe pulmonary nodule. Attention on follow-up recommended. 4. 4 cm ascending thoracic aortic aneurysm, increased from 3.5 cm on 05/12/2020. Recommend annual imaging followup by CTA or MRA.  This recommendation follows 2010 ACCF/AHA/AATS/ACR/ASA/SCA/SCAI/SIR/STS/SVM Guidelines for the Diagnosis and Management of Patients with Thoracic Aortic Disease. Circulation. 2010; 121: W388-E280. Aortic aneurysm NOS (ICD10-I71.9)  5. Aortic Atherosclerosis (ICD10-I70.0).      INTERVAL HISTORY:  Ryan Wilkerson is here for a follow up of metastatic NET. He was last seen by NP [name] on 06/05/22. He presents to the clinic alone. He reports he is doing well overall, no new concerns. He tells me he met with his urologist, who started him on a medication to prepare him for starting ADT.   All other systems were reviewed with the patient and are negative.  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 [facility])    Nocturia    Pericardial effusion    Pericarditis    Prostate cancer [facility]) urologist-- dr Tresa Moore /  oncologist-- dr manning   prostate bx 06-24-2016 and 09-15-2017 at dr Tresa Moore office  Stage T1c, Gleason 4+3, PSA 8.48, vol 33cc---  planned external beam radiation therpay   Tobacco abuse    Urgency of urination    Wears glasses     SURGICAL HISTORY: Past Surgical History:  Procedure Laterality Date   COLONOSCOPY  last one 06/ 2018   GOLD SEED IMPLANT N/A 12/23/2017   Procedure: GOLD SEED IMPLANT;  Surgeon: Alexis Frock, MD;  [facility];  Service: Urology;  Laterality: N/A;   LEFT HEART CATH AND CORONARY ANGIOGRAPHY N/A 05/12/2020   Procedure: LEFT HEART CATH AND CORONARY ANGIOGRAPHY;  Surgeon: Burnell Blanks, MD;  Location: [place] [facility]  [facility];  Service: Cardiovascular;  Laterality: N/A;   PROSTATE BIOPSY  06-24-2016;  09-15-2017-- at dr Tresa Moore office   [place] N/A 12/23/2017   Procedure: [place];  Surgeon: Alexis Frock, MD;  Location: [facility];  Service: Urology;  Laterality: N/A;    I have reviewed the social history and family history with the patient and they are unchanged from previous note.  ALLERGIES:  is allergic to aspirin, primidone, and simvastatin.  MEDICATIONS:  Current Outpatient Medications  Medication Sig Dispense Refill   acetaminophen (TYLENOL) 500 MG tablet Take 1,000 mg by mouth every 6 (six) hours as needed for headache (pain).      clopidogrel (PLAVIX) 75 MG tablet Take 75 mg by mouth daily.      dicyclomine (BENTYL) 10 MG capsule Take 1 capsule (10 mg total) by mouth every 8 (eight) hours as needed for spasms. 20 capsule 0   fluticasone (FLONASE) 50 MCG/ACT nasal spray Place 2 sprays into both nostrils daily. 16 g 0   loratadine (CLARITIN) 10 MG tablet Take 10 mg by mouth every morning.      OVER THE COUNTER MEDICATION Place 1 drop into both eyes daily as needed (dry eyes). Over the counter lubricating  eye drop      pantoprazole (PROTONIX) 40 MG tablet Take 1 tablet (40 mg total) by mouth daily. 30 tablet 0   polyethylene glycol (MIRALAX / GLYCOLAX) 17 g packet Take 17 g by mouth 2 (two) times daily. 14 each 0   SYMBICORT 160-4.5 MCG/ACT inhaler Inhale 1-2 puffs into the lungs as needed.     traMADol (ULTRAM) 50 MG tablet Take 1-2 tablets (50-100 mg total) by mouth every 6 (six) hours as needed for moderate pain or severe pain. 30 tablet 0   No current facility-administered medications for this visit.    PHYSICAL EXAMINATION: ECOG PERFORMANCE STATUS: 1 - Symptomatic but completely ambulatory  Vitals:   06/13/22 1044  BP: 137/70  Pulse: 83  Resp: 15  Temp: 98 F (36.7 C)  SpO2: 98%   Wt Readings from Last 3 Encounters:  06/13/22 152 lb 3.2 oz  (69 kg)  06/05/22 151 lb 1.6 oz (68.5 kg)  03/12/22 149 lb 11.2 oz (67.9 kg)     GENERAL:alert, no distress and comfortable SKIN: skin color normal, no rashes or significant lesions EYES: normal, Conjunctiva are pink and non-injected, sclera clear  NEURO: alert & oriented x 3 with fluent speech  LABORATORY DATA:  I have reviewed the data as listed    Latest Ref Rng & Units 05/07/2022    1:56 PM 01/13/2022    2:37 PM 12/18/2021    1:22 PM  CBC  WBC 4.0 - 10.5 K/uL 5.2  5.9  5.2   Hemoglobin 13.0 - 17.0 g/dL 11.8  12.1  11.5   Hematocrit 39.0 - 52.0 % 35.4  37.2  35.0   Platelets 150 - 400 K/uL 189  212  191         Latest Ref Rng & Units 05/07/2022    1:56 PM 01/13/2022    2:37 PM 12/18/2021    1:22 PM  CMP  Glucose 70 - 99 mg/dL 103  95  161   BUN 8 - 23 mg/dL _0 Creatinine 0.61 - 1.24 mg/dL 1.43  1.46  1.32   Sodium 135 - 145 mmol/L 139  137  137   Potassium 3.5 - 5.1 mmol/L 3.8  4.3  3.9   Chloride 98 - 111 mmol/L 105  101  103   CO2 22 - 32 mmol/L 29  32  30   Calcium 8.9 - 10.3 mg/dL 9.2  9.0  8.7   Total Protein 6.5 - 8.1 g/dL 6.8  6.7  6.3   Total Bilirubin 0.3 - 1.2 mg/dL 0.4  0.5  0.5   Alkaline Phos 38 - 126 U/L 47  56  51   AST 15 - 41 U/L _1 ALT 0 - 44 U/L _2 RADIOGRAPHIC STUDIES: I have personally reviewed the radiological images as listed and agreed with the findings in the report. No results found.    Orders Placed This Encounter  Procedures   NM PET (CU-64 DETECTNET)SKULL TO MID THIGH    Standing Status:   Future    Standing Expiration Date:   06/14/2023    Order Specific Question:   If indicated for the ordered procedure, I authorize the administration of a radiopharmaceutical per Radiology protocol    Answer:   Yes    Order Specific Question:   Preferred imaging location?    Answer:   [facility]  All questions were answered. The patient knows to call the clinic with any problems, questions or concerns. No  barriers to learning was detected. The total time spent in the appointment was 30 minutes.     Truitt Merle, MD 06/13/2022   I, Wilburn Mylar, am acting as scribe for Truitt Merle, MD.   I have reviewed the above documentation for accuracy and completeness, and I agree with the above.

## 2022-06-21 DIAGNOSIS — Z23 Encounter for immunization: Secondary | ICD-10-CM | POA: Diagnosis not present

## 2022-06-23 DIAGNOSIS — C61 Malignant neoplasm of prostate: Secondary | ICD-10-CM | POA: Diagnosis not present

## 2022-06-23 DIAGNOSIS — Z5111 Encounter for antineoplastic chemotherapy: Secondary | ICD-10-CM | POA: Diagnosis not present

## 2022-07-04 ENCOUNTER — Other Ambulatory Visit: Payer: Self-pay

## 2022-07-04 ENCOUNTER — Inpatient Hospital Stay: Payer: Medicare HMO | Attending: Nurse Practitioner

## 2022-07-04 VITALS — BP 136/67 | HR 63 | Temp 98.3°F | Resp 16

## 2022-07-04 DIAGNOSIS — C7B02 Secondary carcinoid tumors of liver: Secondary | ICD-10-CM | POA: Insufficient documentation

## 2022-07-04 DIAGNOSIS — Z7189 Other specified counseling: Secondary | ICD-10-CM

## 2022-07-04 DIAGNOSIS — C7A00 Malignant carcinoid tumor of unspecified site: Secondary | ICD-10-CM | POA: Insufficient documentation

## 2022-07-04 DIAGNOSIS — C7B8 Other secondary neuroendocrine tumors: Secondary | ICD-10-CM

## 2022-07-04 MED ORDER — LANREOTIDE ACETATE 120 MG/0.5ML ~~LOC~~ SOLN
120.0000 mg | Freq: Once | SUBCUTANEOUS | Status: AC
  Administered 2022-07-04: 120 mg via SUBCUTANEOUS
  Filled 2022-07-04: qty 120

## 2022-07-30 ENCOUNTER — Other Ambulatory Visit: Payer: Self-pay | Admitting: Medical Oncology

## 2022-07-30 DIAGNOSIS — C7B8 Other secondary neuroendocrine tumors: Secondary | ICD-10-CM

## 2022-07-31 ENCOUNTER — Other Ambulatory Visit: Payer: Self-pay

## 2022-07-31 ENCOUNTER — Inpatient Hospital Stay: Payer: Medicare HMO

## 2022-07-31 DIAGNOSIS — C7B8 Other secondary neuroendocrine tumors: Secondary | ICD-10-CM

## 2022-07-31 DIAGNOSIS — C7A00 Malignant carcinoid tumor of unspecified site: Secondary | ICD-10-CM | POA: Diagnosis not present

## 2022-07-31 DIAGNOSIS — C7B02 Secondary carcinoid tumors of liver: Secondary | ICD-10-CM | POA: Diagnosis not present

## 2022-07-31 LAB — CMP (CANCER CENTER ONLY)
ALT: 19 U/L (ref 0–44)
AST: 21 U/L (ref 15–41)
Albumin: 3.9 g/dL (ref 3.5–5.0)
Alkaline Phosphatase: 60 U/L (ref 38–126)
Anion gap: 5 (ref 5–15)
BUN: 14 mg/dL (ref 8–23)
CO2: 31 mmol/L (ref 22–32)
Calcium: 9.2 mg/dL (ref 8.9–10.3)
Chloride: 103 mmol/L (ref 98–111)
Creatinine: 1.18 mg/dL (ref 0.61–1.24)
GFR, Estimated: >60 mL/min (ref >60)
Glucose, Bld: 130 mg/dL — ABNORMAL HIGH (ref 70–99)
Potassium: 4.2 mmol/L (ref 3.5–5.1)
Sodium: 139 mmol/L (ref 135–145)
Total Bilirubin: 0.4 mg/dL (ref 0.3–1.2)
Total Protein: 6.9 g/dL (ref 6.5–8.1)

## 2022-07-31 LAB — CBC WITH DIFFERENTIAL (CANCER CENTER ONLY)
Abs Immature Granulocytes: 0.03 10*3/uL (ref 0.00–0.07)
Basophils Absolute: 0.1 10*3/uL (ref 0.0–0.1)
Basophils Relative: 1 %
Eosinophils Absolute: 0.4 10*3/uL (ref 0.0–0.5)
Eosinophils Relative: 6 %
HCT: 35.4 % — ABNORMAL LOW (ref 39.0–52.0)
Hemoglobin: 11.6 g/dL — ABNORMAL LOW (ref 13.0–17.0)
Immature Granulocytes: 1 %
Lymphocytes Relative: 27 %
Lymphs Abs: 1.7 10*3/uL (ref 0.7–4.0)
MCH: 30.5 pg (ref 26.0–34.0)
MCHC: 32.8 g/dL (ref 30.0–36.0)
MCV: 93.2 fL (ref 80.0–100.0)
Monocytes Absolute: 0.5 10*3/uL (ref 0.1–1.0)
Monocytes Relative: 7 %
Neutro Abs: 3.6 10*3/uL (ref 1.7–7.7)
Neutrophils Relative %: 58 %
Platelet Count: 202 10*3/uL (ref 150–400)
RBC: 3.8 MIL/uL — ABNORMAL LOW (ref 4.22–5.81)
RDW: 11.5 % (ref 11.5–15.5)
WBC Count: 6.2 10*3/uL (ref 4.0–10.5)
nRBC: 0 % (ref 0.0–0.2)

## 2022-08-07 NOTE — Progress Notes (Signed)
[facility]   [phone] [phone]   Clinic Follow up Note   Patient Care Team: Alroy Dust, L.Marlou Sa, MD as PCP - General (Family Medicine) Burnell Blanks, MD as PCP - Cardiology (Cardiology) Jonnie Finner, RN (Inactive) as Oncology Nurse Navigator Truitt Merle, MD as Consulting Physician (Oncology) Alla Feeling, NP as Nurse Practitioner (Nurse Practitioner) Alexis Frock, MD as Consulting Physician (Urology) Tyler Pita, MD as Consulting Physician (Radiation Oncology)  Date of Service:  08/08/2022  CHIEF COMPLAINT: f/u of  metastatic neuroendocrine tumor   CURRENT THERAPY:  Lanreotide, monthly, since 10/18/20   ASSESSMENT:  Ryan Wilkerson is a 76 y.o. 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 DOTATATE PET on 04/09/20 showed intense uptake in the central mesenteric mass with 3 liver metastases and small peritoneal/nodal metastatic deposits along the left iliac vessels. There is no primary bowel lesion identified.   -His 04/30/20 liver biopsy showed metastatic well differentiated/G1 neuroendocrine tumor to the liver. --He began first line Sandostatin 20 mg on 05/10/20. However on 05/12/20 he had pericarditis and treatment was held. 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 MTammi Klippel-[facility] --PSMA PET scan on 06/06/22 showed intensely radiotracer-avid iliac and retroperitoneal nodes; no  activity within prostate gland, evidence of metastatic adenopathy outside of abdomen/pelvis, visceral metastasis, or skeletal metastasis.  -he is on ADT now per his urologist   Breast pain -he complains of bilateral breast tenderness and enlargement since he started ADT a month ago. -Breast exam showed a 2 to 3 cm mass in the right lateral breast, I will obtain diagnostic mammogram and ultrasound for further evaluation.   PLAN: -lab reviewed normal -continue Lanreotide Injection today and every 4 weeks  -refill Tramadol -Lanreotide Injection in 4 wks f/u in 12 wks with lab and Cu-64 PET a few days before  -Bilateral diagnostic mammogram and right breast and axilla ultrasound at the breast center in 1-2 weeks  -He will call his insurance to see if we are still in his insurance next work     SUMMARY OF ONCOLOGIC HISTORY: Oncology History Overview Note  Cancer Staging No matching staging information was found for the patient.    Malignant neoplasm of prostate ([place]  08/31/2017 Initial Diagnosis   Malignant [place] of prostate ([place]   10/28/2017 Genetic Testing   The patient had genetic testing due to a personal history of prostate cancer and a family history of breast and stomach cancer.  The Common Hereditary Cancers Panel + Prostate Cancer Panel was ordered.  APC, ATM, AXIN2, BARD1, BMPR1A, BRCA1, BRCA2, BRIP1, CDH1, CDK4, CDKN2A (p14ARF), CDKN2A (p16INK4a), CHEK2, CTNNA1, DICER1, EPCAM*, FANCA, GREM1*, KIT, MEN1, MLH1, MSH2, MSH3, MSH6, MUTYH, NBN, NF1, PALB2, PDGFRA, PMS2, POLD1, POLE, PTEN, RAD50, RAD51C, RAD51D, SDHB, SDHC, SDHD, SMAD4, SMARCA4, STK11, TP53, TSC1, TSC2, VHL. The following genes were evaluated for sequence changes only: HOXB13*, NTHL1*, SDHA.   Results: Negative, no pathogenic variants identified. The date of this test report  is 10/28/2017.    01/05/2018 - 02/12/2018 Radiation Therapy   S/p radiation to the prostate 70 Gy in 28 fractions from 01/05/2018-02/12/2018 per  Dr. Tammi Klippel   01/31/2021 Imaging   CT A/P  IMPRESSION: 1. Interval decrease in size of hypodense lesions of the inferior right lobe of the liver and posterior liver dome findings are consistent with treatment response of metastatic lesions. 2. No evidence of new metastatic disease in the abdomen or pelvis. 3. Prostatomegaly.   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  Upper normal 9 mm short axis lymph node in the adjacent mesentery 2.  1.6 x 1.4 cm heterogeneous, poorly defined lesion in the inferior right liver suspicious for metastatic disease, the differential includes GI primary or metastatic prostate cancer No retroperitoneal or pelvic sidewall lymphadenopathy   04/01/2020 PET scan   IMPRESSION: 1. Central mesenteric mass with intense radiotracer activity consistent well differentiated neuroendocrine tumor 2. 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 3. Small peritoneal/nodal metastasis along the LEFT iliac vessels. 4. Very small lesion in the pancreas with intermediate activity could represent primary lesion. No primary bowel lesion identified. 5. Small metastatic implant within the pericardium adjacent LEFT heart ventricle. 6. No skeletal metastasis.   04/30/2020 Pathology Results   FINAL MICROSCOPIC DIAGNOSIS:  A. 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  Tumor cells are negative  for TTF-1.  The findings are consistent with neuroendocrine tumor of  gastrointestinal origin.  Ki-67 stain shows a proliferative  index of  about 1%, consistent with well-differentiated, grade 1 tumor.    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 Held after cycle 1 due to suspected pericarditis and deconditioning.           --Changed to monthly Lanreotide on 10/18/20.     01/31/2021 Imaging   CT A/P  IMPRESSION: 1. Interval decrease in size of hypodense lesions of the inferior right lobe of the liver and posterior liver dome findings are consistent with treatment response of metastatic lesions. 2. No evidence of new metastatic disease in the abdomen or pelvis. 3. Prostatomegaly.   07/29/2021 Imaging   EXAM: CT CHEST, ABDOMEN, AND PELVIS WITH CONTRAST  IMPRESSION: 1. Slight interval increase in size of the 2 liver metastases. No new liver lesions evident. 2. Stable mild lymphadenopathy in the central small bowel mesentery. 3. 3 mm right upper lobe pulmonary nodule. Attention on follow-up recommended. 4. 4 cm ascending thoracic aortic aneurysm, increased from 3.5 cm on 05/12/2020. Recommend annual imaging followup by CTA or MRA. This recommendation follows 2010 ACCF/AHA/AATS/ACR/ASA/SCA/SCAI/SIR/STS/SVM Guidelines for the Diagnosis and Management of Patients with Thoracic Aortic Disease. Circulation. 2010; 121: O350-K938. Aortic aneurysm NOS (ICD10-I71.9)  5. Aortic Atherosclerosis (ICD10-I70.0).      INTERVAL HISTORY:  Ryan Wilkerson is here for a follow up of metastatic neuroendocrine tumor. He was last seen by me on 06/13/2022 He presents to the clinic alone. Pt reports his appetite is much better. He has gained some weight. Pt states he is still using the miralax  to help with Constipation. Pt Report he has some pain under the right side below rib cage.     All other systems were reviewed with the patient and are negative.  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 [facility])    Nocturia    Pericardial effusion    Pericarditis    Prostate cancer [facility]) urologist-- dr Tresa Moore /  oncologist-- dr manning   prostate bx 06-24-2016 and 09-15-2017 at dr Tresa Moore office  Stage T1c, Gleason 4+3, PSA 8.48, vol 33cc---  planned external beam radiation therpay   Tobacco abuse    Urgency of urination    Wears glasses     SURGICAL HISTORY: Past Surgical History:  Procedure Laterality Date   COLONOSCOPY  last one 06/ 2018   GOLD SEED IMPLANT N/A 12/23/2017   Procedure: GOLD SEED IMPLANT;  Surgeon: Alexis Frock, MD;  Location: [facility];  Service: Urology;  Laterality: N/A;   LEFT HEART CATH AND CORONARY ANGIOGRAPHY N/A 05/12/2020   Procedure: LEFT HEART CATH AND CORONARY ANGIOGRAPHY;  Surgeon: Burnell Blanks, MD;  Location: [facility];  Service: Cardiovascular;  Laterality: N/A;   PROSTATE BIOPSY  06-24-2016;  09-15-2017-- at dr Tresa Moore office   [place] N/A 12/23/2017   Procedure: [place];  Surgeon: Alexis Frock, MD;  Location: [facility];  Service: Urology;  Laterality: N/A;    I have reviewed the social history and family history with the patient and they are unchanged from previous note.  ALLERGIES:  is allergic to aspirin, primidone, and simvastatin.  MEDICATIONS:  Current Outpatient Medications  Medication Sig Dispense Refill   acetaminophen (TYLENOL) 500 MG tablet Take 1,000 mg by mouth every 6 (six) hours as needed for headache (pain).      clopidogrel (PLAVIX) 75  MG tablet Take 75 mg by mouth daily.      dicyclomine (BENTYL) 10 MG capsule Take 1 capsule (10 mg total) by mouth every 8 (eight) hours as needed for spasms. 20 capsule 0   fluticasone (FLONASE) 50 MCG/ACT nasal spray Place 2 sprays into both nostrils daily. 16 g 0   loratadine (CLARITIN) 10 MG tablet Take 10 mg by mouth every morning.      OVER THE COUNTER MEDICATION Place 1 drop into both eyes daily as needed (dry eyes). Over the counter lubricating eye drop      pantoprazole (PROTONIX) 40 MG tablet Take 1 tablet (40 mg total) by mouth daily. 30 tablet 0   polyethylene glycol (MIRALAX / GLYCOLAX) 17 g packet Take 17 g by mouth 2 (two) times daily. 14 each 0   SYMBICORT 160-4.5 MCG/ACT inhaler Inhale 1-2 puffs into the lungs as needed.     traMADol (ULTRAM) 50 MG tablet Take 1-2 tablets (50-100 mg total) by mouth every 6 (six) hours as needed for moderate pain or severe pain. 15 tablet 0   No current facility-administered medications for this visit.    PHYSICAL EXAMINATION: ECOG PERFORMANCE STATUS: 1 -  Symptomatic but completely ambulatory  Vitals:   08/08/22 1310  BP: 122/76  Pulse: 85  Resp: 18  Temp: 98.3 F (36.8 C)  SpO2: 100%   Wt Readings from Last 3 Encounters:  08/08/22 160 lb 6.4 oz (72.8 kg)  06/13/22 152 lb 3.2 oz (69 kg)  06/05/22 151 lb 1.6 oz (68.5 kg)     GENERAL:alert, no distress and comfortable SKIN: skin color normal, no rashes or significant lesions EYES: normal, Conjunctiva are pink and non-injected, sclera clear  NEURO: alert & oriented x 3 with fluent speech BREAST:Right breast a 2x3 lump lateral. Left side normal no palpable mass.   LABORATORY DATA:  I have reviewed the data as listed    Latest Ref Rng & Units 07/31/2022    1:03 PM 05/07/2022    1:56 PM 01/13/2022    2:37 PM  CBC  WBC 4.0 - 10.5 K/uL 6.2  5.2  5.9   Hemoglobin 13.0 - 17.0 g/dL 11.6  11.8  12.1   Hematocrit 39.0 - 52.0 % 35.4  35.4  37.2   Platelets 150 - 400 K/uL 202  189  212          Latest Ref Rng & Units 07/31/2022    1:03 PM 05/07/2022    1:56 PM 01/13/2022    2:37 PM  CMP  Glucose 70 - 99 mg/dL 130  103  95   BUN 8 - 23 mg/dL _0 Creatinine 0.61 - 1.24 mg/dL 1.18  1.43  1.46   Sodium 135 - 145 mmol/L 139  139  137   Potassium 3.5 - 5.1 mmol/L 4.2  3.8  4.3   Chloride 98 - 111 mmol/L 103  105  101   CO2 22 - 32 mmol/L 31  29  32   Calcium 8.9 - 10.3 mg/dL 9.2  9.2  9.0   Total Protein 6.5 - 8.1 g/dL 6.9  6.8  6.7   Total Bilirubin 0.3 - 1.2 mg/dL 0.4  0.4  0.5   Alkaline Phos 38 - 126 U/L 60  47  56   AST 15 - 41 U/L _1 ALT 0 - 44 U/L _2 RADIOGRAPHIC STUDIES: I have personally reviewed the radiological images as listed and agreed with the findings in the report. No results found.    Orders Placed This Encounter  Procedures   MM DIAG BREAST TOMO BILATERAL    Standing Status:   Future    Standing Expiration Date:   08/09/2023    Order Specific Question:   Reason for Exam (SYMPTOM  OR DIAGNOSIS REQUIRED)    Answer:   right breast mass    Order Specific Question:   Preferred imaging location?    [facility]   US BREAST LTD UNI RIGHT INC AXILLA    Pt has metastatic prostate cancer, on ADT    Standing Status:   Future    Standing Expiration Date:   08/09/2023    Order Specific Question:   Reason for Exam (SYMPTOM  OR DIAGNOSIS REQUIRED)    Answer:   right breast mass    Order Specific Question:   Preferred imaging location?    Answer:   [facility]   All questions were answered. The patient knows to call the clinic with any problems, questions or concerns. No barriers to learning was detected. The total  time spent in the appointment was 30 minutes.     Truitt Merle, MD 08/08/2022   Felicity Coyer, CMA, am acting as scribe for Truitt Merle, MD.   I have reviewed the above documentation for accuracy and completeness, and I agree with the above.

## 2022-08-08 ENCOUNTER — Other Ambulatory Visit: Payer: Self-pay

## 2022-08-08 ENCOUNTER — Inpatient Hospital Stay: Payer: Medicare HMO

## 2022-08-08 ENCOUNTER — Inpatient Hospital Stay: Payer: Medicare HMO | Attending: Nurse Practitioner | Admitting: Hematology

## 2022-08-08 ENCOUNTER — Encounter: Payer: Self-pay | Admitting: Hematology

## 2022-08-08 VITALS — BP 122/76 | HR 85 | Temp 98.3°F | Resp 18 | Ht 68.0 in | Wt 160.4 lb

## 2022-08-08 DIAGNOSIS — Z7189 Other specified counseling: Secondary | ICD-10-CM

## 2022-08-08 DIAGNOSIS — Z9221 Personal history of antineoplastic chemotherapy: Secondary | ICD-10-CM | POA: Diagnosis not present

## 2022-08-08 DIAGNOSIS — C7A00 Malignant carcinoid tumor of unspecified site: Secondary | ICD-10-CM | POA: Insufficient documentation

## 2022-08-08 DIAGNOSIS — C7B02 Secondary carcinoid tumors of liver: Secondary | ICD-10-CM | POA: Diagnosis not present

## 2022-08-08 DIAGNOSIS — C7B8 Other secondary neuroendocrine tumors: Secondary | ICD-10-CM | POA: Diagnosis not present

## 2022-08-08 DIAGNOSIS — Z8546 Personal history of malignant neoplasm of prostate: Secondary | ICD-10-CM | POA: Diagnosis not present

## 2022-08-08 DIAGNOSIS — C61 Malignant neoplasm of prostate: Secondary | ICD-10-CM | POA: Diagnosis not present

## 2022-08-08 DIAGNOSIS — Z79899 Other long term (current) drug therapy: Secondary | ICD-10-CM | POA: Diagnosis not present

## 2022-08-08 MED ORDER — LANREOTIDE ACETATE 120 MG/0.5ML ~~LOC~~ SOLN
120.0000 mg | Freq: Once | SUBCUTANEOUS | Status: AC
  Administered 2022-08-08: 120 mg via SUBCUTANEOUS
  Filled 2022-08-08: qty 120

## 2022-08-08 MED ORDER — TRAMADOL HCL 50 MG PO TABS: 50.0000 mg | ORAL_TABLET | Freq: Four times a day (QID) | ORAL | 0 refills | Status: AC | PRN

## 2022-08-08 NOTE — Assessment & Plan Note (Signed)
Most consistent with GI primary with liver and nodal metastasis -Due to rising PSA, CT/bone scan were obtained showing a mesenteric mass and single liver lesion. DOTATATE PET on 04/09/20 showed intense uptake in the central mesenteric mass with 3 liver metastases and small peritoneal/nodal metastatic deposits along the left iliac vessels. There is no primary bowel lesion identified.   -His 04/30/20 liver biopsy showed metastatic well differentiated/G1 neuroendocrine tumor to the liver. --He began first line Sandostatin 20 mg on 05/10/20. However on 05/12/20 he had pericarditis and treatment was held. I changed him to Lanreotide '120mg'$  every 4 weeks starting 10/18/20  -he is clinically stable, tolerating treatment well, last CT in 12/2021 showed stable disease

## 2022-08-08 NOTE — Progress Notes (Signed)
Epic faxed orders for mammogram and ultrasound to [facility].  Epic fax confirmation received.

## 2022-08-08 NOTE — Assessment & Plan Note (Signed)
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 Tammi Klippel -Followed by Dr. Tresa Moore at [facility] --PSMA PET scan on 06/06/22 showed intensely radiotracer-avid iliac and retroperitoneal nodes; no activity within prostate gland, evidence of metastatic adenopathy outside of abdomen/pelvis, visceral metastasis, or skeletal metastasis.  -he is on ADT now per his urologist

## 2022-08-11 ENCOUNTER — Telehealth: Payer: Self-pay | Admitting: Hematology

## 2022-08-11 NOTE — Telephone Encounter (Signed)
Called patient to notify of upcoming appointments. Patient notified and requested calendar. Mailing today

## 2022-08-28 ENCOUNTER — Ambulatory Visit (HOSPITAL_COMMUNITY)
Admission: RE | Admit: 2022-08-28 | Discharge: 2022-08-28 | Disposition: A | Discharge status: Home / Self Care | Payer: Medicare HMO | Source: Ambulatory Visit | Attending: Hematology | Admitting: Hematology

## 2022-08-28 ENCOUNTER — Other Ambulatory Visit: Payer: Self-pay | Admitting: Hematology

## 2022-08-28 DIAGNOSIS — C7B8 Other secondary neuroendocrine tumors: Secondary | ICD-10-CM | POA: Diagnosis not present

## 2022-08-28 DIAGNOSIS — C787 Secondary malignant neoplasm of liver and intrahepatic bile duct: Secondary | ICD-10-CM | POA: Diagnosis not present

## 2022-08-28 DIAGNOSIS — C786 Secondary malignant neoplasm of retroperitoneum and peritoneum: Secondary | ICD-10-CM | POA: Diagnosis not present

## 2022-08-28 MED ORDER — COPPER CU 64 DOTATATE 1 MCI/ML IV SOLN
4.0000 | Freq: Once | INTRAVENOUS | Status: AC
  Administered 2022-08-28: 3.3 via INTRAVENOUS

## 2022-09-02 ENCOUNTER — Encounter: Payer: Self-pay | Admitting: Hematology

## 2022-09-02 ENCOUNTER — Inpatient Hospital Stay: Payer: Medicare HMO | Attending: Nurse Practitioner | Admitting: Hematology

## 2022-09-02 ENCOUNTER — Telehealth: Payer: Self-pay | Admitting: Hematology

## 2022-09-02 DIAGNOSIS — Z923 Personal history of irradiation: Secondary | ICD-10-CM | POA: Insufficient documentation

## 2022-09-02 DIAGNOSIS — Z8546 Personal history of malignant neoplasm of prostate: Secondary | ICD-10-CM | POA: Insufficient documentation

## 2022-09-02 DIAGNOSIS — Z7902 Long term (current) use of antithrombotics/antiplatelets: Secondary | ICD-10-CM | POA: Insufficient documentation

## 2022-09-02 DIAGNOSIS — C7B8 Other secondary neuroendocrine tumors: Secondary | ICD-10-CM | POA: Diagnosis not present

## 2022-09-02 DIAGNOSIS — C7A00 Malignant carcinoid tumor of unspecified site: Secondary | ICD-10-CM | POA: Insufficient documentation

## 2022-09-02 DIAGNOSIS — N644 Mastodynia: Secondary | ICD-10-CM | POA: Insufficient documentation

## 2022-09-02 DIAGNOSIS — C7B02 Secondary carcinoid tumors of liver: Secondary | ICD-10-CM | POA: Insufficient documentation

## 2022-09-02 DIAGNOSIS — Z79899 Other long term (current) drug therapy: Secondary | ICD-10-CM | POA: Insufficient documentation

## 2022-09-02 NOTE — Telephone Encounter (Signed)
Patient aware of telephone visit today

## 2022-09-02 NOTE — Progress Notes (Signed)
[place]   [phone] Fax:(336) [phone]   Clinic Follow up Note   Patient Care Team: Alroy Dust, L.Marlou Sa, MD as PCP - General (Family Medicine) Burnell Blanks, MD as PCP - Cardiology (Cardiology) Jonnie Finner, RN (Inactive) as Oncology Nurse Navigator Truitt Merle, MD as Consulting Physician (Oncology) Alla Feeling, NP as Nurse Practitioner (Nurse Practitioner) Alexis Frock, MD as Consulting Physician (Urology) Tyler Pita, MD as Consulting Physician (Radiation Oncology) Imaging, [facility] [place] [facility] as Consulting Physician (Diagnostic Radiology) 09/02/2022  I connected with Ryan Wilkerson on 09/02/22 at 11:40 AM EST by telephone and verified that I am speaking with the correct person using two identifiers.   I discussed the limitations, risks, security and privacy concerns of performing an evaluation and management service by telephone and the availability of in person appointments. I also discussed with the patient that there may be a patient responsible charge related to this service. The patient expressed understanding and agreed to proceed.   Patient's location:  Home  Provider's location:  Office    CHIEF COMPLAINT: discuss PET scan result    CURRENT THERAPY: Lanreotide 150m every 4 weeks    ASSESSMENT & PLAN: Ryan ROSENZWEIGis a 77y.o. 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 Most consistent with GI primary with liver and nodal metastasis -Due to rising PSA, CT/bone scan were obtained showing a mesenteric mass and single liver lesion. DOTATATE PET on 04/09/20 showed intense uptake in the central mesenteric mass with 3 liver metastases and small peritoneal/nodal metastatic deposits along the left iliac vessels. There is no primary bowel lesion identified.   -His 04/30/20 liver biopsy showed metastatic well differentiated/G1 neuroendocrine tumor to the liver. --He began first line  Sandostatin 20 mg on 05/10/20. However on 05/12/20 he had pericarditis and treatment was held. I changed him to Lanreotide 1260mevery 4 weeks starting 10/18/20  -he is clinically stable, tolerating treatment well -I personally reviewed the recent restaging dotatate PET scan from August 28, 2022, which showed stable disease in small intestine, liver, peritoneum, and a small deposit pericardium.  This is very similar to the dotatate scan from 2021.   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 MaTammi KlippelFollowed by Dr. [facility] --PSMA PET scan on 06/06/22 showed intensely radiotracer-avid iliac and retroperitoneal nodes; no activity within prostate gland, evidence of metastatic adenopathy outside of abdomen/pelvis, visceral metastasis, or skeletal metastasis.  -he is on ADT now per his urologist    Breast pain -he complains of bilateral breast tenderness and enlargement since he started ADT a month ago. -Breast exam showed a 2 to 3 cm mass in the right lateral breast on last visit, I have ordered diagnostic mammogram and ultrasound for further evaluation, which are scheduled for 10/01/22.  Plan -PET scan reviewed, stable disease. -Continue lanreotide injection every 4 weeks -Follow-up in 12 weeks.  SUMMARY OF ONCOLOGIC HISTORY: Oncology History Overview Note  Cancer Staging No matching staging information was found for the patient.    Malignant neoplasm of prostate ([facility] 08/31/2017 Initial Diagnosis   Malignant neoplasm of prostate ([facility]  10/28/2017 Genetic Testing   The patient had genetic testing due to a personal history of prostate cancer and a family history of breast and stomach cancer.  The Common Hereditary Cancers Panel + Prostate Cancer Panel was ordered.  APC, ATM, AXIN2, BARD1, BMPR1A, BRCA1,  BRCA2, BRIP1, CDH1, CDK4, CDKN2A (p14ARF), CDKN2A (p16INK4a), CHEK2, CTNNA1, DICER1, EPCAM*, FANCA, GREM1*, KIT, MEN1, MLH1, MSH2, MSH3, MSH6, MUTYH, NBN, NF1, PALB2, PDGFRA, PMS2, POLD1, POLE, PTEN, RAD50, RAD51C, RAD51D, SDHB, SDHC, SDHD, SMAD4, SMARCA4, STK11, TP53, TSC1, TSC2, VHL. The following genes were evaluated for sequence changes only: HOXB13*, NTHL1*, SDHA.   Results: Negative, no pathogenic variants identified. The date of this test report is 10/28/2017.    01/05/2018 - 02/12/2018 Radiation Therapy   S/p radiation to the prostate 70 Gy in 28 fractions from 01/05/2018-02/12/2018 per Dr. Tammi Klippel   01/31/2021 Imaging   CT A/P  IMPRESSION: 1. Interval decrease in size of hypodense lesions of the inferior right lobe of the liver and posterior liver dome findings are consistent with treatment response of metastatic lesions. 2. No evidence of new metastatic disease in the abdomen or pelvis. 3. Prostatomegaly.   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  Upper normal 9 mm short axis lymph node in the adjacent mesentery 2.  1.6 x 1.4 cm heterogeneous, poorly defined lesion in the inferior right liver suspicious for metastatic disease, the differential includes GI primary or metastatic prostate cancer No retroperitoneal or pelvic sidewall lymphadenopathy   04/01/2020 PET scan   IMPRESSION: 1. Central mesenteric mass with intense radiotracer activity consistent well differentiated neuroendocrine tumor 2. 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 3. Small peritoneal/nodal metastasis along the LEFT iliac vessels. 4. Very small lesion in the pancreas with intermediate activity could represent primary  lesion. No primary bowel lesion identified. 5. Small metastatic implant within the pericardium adjacent LEFT heart ventricle. 6. No skeletal metastasis.   04/30/2020 Pathology Results   FINAL MICROSCOPIC DIAGNOSIS:  A. 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  Tumor cells are negative  for TTF-1.  The findings are consistent with neuroendocrine tumor of  gastrointestinal origin.  Ki-67 stain shows a proliferative index of  about 1%, consistent with well-differentiated, grade 1 tumor.    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 Held after cycle 1 due to suspected pericarditis and deconditioning.           --Changed to monthly Lanreotide on 10/18/20.     01/31/2021 Imaging   CT A/P  IMPRESSION: 1. Interval decrease in size of hypodense lesions of the inferior right lobe of the liver and posterior liver dome findings are consistent with treatment response of metastatic lesions. 2. No evidence of new metastatic disease in the abdomen or pelvis. 3. Prostatomegaly.   07/29/2021 Imaging   EXAM: CT CHEST, ABDOMEN, AND PELVIS WITH CONTRAST  IMPRESSION: 1. Slight interval increase in size of the 2 liver metastases. No new liver lesions evident. 2. Stable mild lymphadenopathy in the central small bowel mesentery. 3. 3 mm right upper lobe pulmonary nodule. Attention on follow-up recommended. 4. 4 cm ascending thoracic aortic aneurysm, increased from 3.5 cm on 05/12/2020. Recommend annual imaging followup by CTA or  MRA. This recommendation follows 2010 ACCF/AHA/AATS/ACR/ASA/SCA/SCAI/SIR/STS/SVM Guidelines for the Diagnosis and Management of Patients with Thoracic Aortic Disease.  Circulation. 2010; 121: J335-K562. Aortic aneurysm NOS (ICD10-I71.9)  5. Aortic Atherosclerosis (ICD10-I70.0).     INTERVAL HISTORY: Pt is here for a routine scheduled virtual follow up.  I verified his identity.  He is clinically doing well, still has occasional constipation, and mild abdominal cramps sometimes.  No other GI symptoms.  His appetite and energy level are normal, weight is stable.   REVIEW OF SYSTEMS:   Constitutional: Denies fevers, chills or abnormal weight loss Eyes: Denies blurriness of vision Ears, nose, mouth, throat, and face: Denies mucositis or sore throat Respiratory: Denies cough, dyspnea or wheezes Cardiovascular: Denies palpitation, chest discomfort or lower extremity swelling Gastrointestinal:  Denies nausea, heartburn or change in bowel habits Skin: Denies abnormal skin rashes Lymphatics: Denies new lymphadenopathy or easy bruising Neurological:Denies numbness, tingling or new weaknesses Behavioral/Psych: Mood is stable, no new changes  All other systems were reviewed with the patient and are negative.  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 [facility])    Nocturia    Pericardial effusion    Pericarditis    Prostate cancer [facility]) urologist-- dr Tresa Moore /  oncologist-- dr manning   prostate bx 06-24-2016 and 09-15-2017 at dr Tresa Moore office  Stage T1c, Gleason 4+3, PSA 8.48, vol 33cc---  planned external beam radiation therpay   Tobacco abuse    Urgency of urination    Wears glasses     SURGICAL HISTORY: Past Surgical History:  Procedure  Laterality Date   COLONOSCOPY  last one 06/ 2018   GOLD SEED IMPLANT N/A 12/23/2017   Procedure: GOLD SEED IMPLANT;  Surgeon: Alexis Frock, MD;  Location: [facility];  Service: Urology;  Laterality: N/A;   LEFT HEART CATH AND CORONARY ANGIOGRAPHY N/A 05/12/2020   Procedure: LEFT HEART CATH AND CORONARY ANGIOGRAPHY;  Surgeon: Burnell Blanks, MD;  Location: [place] CV LAB;  Service: Cardiovascular;  Laterality: N/A;   PROSTATE BIOPSY  06-24-2016;  09-15-2017-- at dr Tresa Moore office   [place] N/A 12/23/2017   Procedure: [place];  Surgeon: Alexis Frock, MD;  Location: [facility];  Service: Urology;  Laterality: N/A;    I have reviewed the social history and family history with the patient and they are unchanged from previous note.  ALLERGIES:  is allergic to aspirin, primidone, and simvastatin.  MEDICATIONS:  Current Outpatient Medications  Medication Sig Dispense Refill   acetaminophen (TYLENOL) 500 MG tablet Take 1,000 mg by mouth every 6 (six) hours as needed for headache (pain).      clopidogrel (PLAVIX) 75 MG tablet Take 75 mg by mouth daily.      dicyclomine (BENTYL) 10 MG capsule Take 1 capsule (10 mg total) by mouth every 8 (eight) hours as needed for spasms. 20 capsule 0   fluticasone (FLONASE) 50 MCG/ACT nasal spray Place 2 sprays into both nostrils daily. 16 g 0   loratadine (CLARITIN)  10 MG tablet Take 10 mg by mouth every morning.      OVER THE COUNTER MEDICATION Place 1 drop into both eyes daily as needed (dry eyes). Over the counter lubricating eye drop      pantoprazole (PROTONIX) 40 MG tablet Take 1 tablet (40 mg total) by mouth daily. 30 tablet 0   polyethylene glycol (MIRALAX / GLYCOLAX) 17 g packet Take 17 g by mouth 2 (two) times daily. 14 each 0   SYMBICORT 160-4.5 MCG/ACT inhaler Inhale 1-2 puffs into the lungs as needed.     traMADol (ULTRAM) 50 MG tablet Take 1-2 tablets (50-100 mg total) by  mouth every 6 (six) hours as needed for moderate pain or severe pain. 15 tablet 0   No current facility-administered medications for this visit.    PHYSICAL EXAMINATION: Not performed   LABORATORY DATA:  I have reviewed the data as listed    Latest Ref Rng & Units 07/31/2022    1:03 PM 05/07/2022    1:56 PM 01/13/2022    2:37 PM  CBC  WBC 4.0 - 10.5 K/uL 6.2  5.2  5.9   Hemoglobin 13.0 - 17.0 g/dL 11.6  11.8  12.1   Hematocrit 39.0 - 52.0 % 35.4  35.4  37.2   Platelets 150 - 400 K/uL 202  189  212         Latest Ref Rng & Units 07/31/2022    1:03 PM 05/07/2022    1:56 PM 01/13/2022    2:37 PM  CMP  Glucose 70 - 99 mg/dL 130  103  95   BUN 8 - 23 mg/dL _0 Creatinine 0.61 - 1.24 mg/dL 1.18  1.43  1.46   Sodium 135 - 145 mmol/L 139  139  137   Potassium 3.5 - 5.1 mmol/L 4.2  3.8  4.3   Chloride 98 - 111 mmol/L 103  105  101   CO2 22 - 32 mmol/L 31  29  32   Calcium 8.9 - 10.3 mg/dL 9.2  9.2  9.0   Total Protein 6.5 - 8.1 g/dL 6.9  6.8  6.7   Total Bilirubin 0.3 - 1.2 mg/dL 0.4  0.4  0.5   Alkaline Phos 38 - 126 U/L 60  47  56   AST 15 - 41 U/L _1 ALT 0 - 44 U/L _2 RADIOGRAPHIC STUDIES: I have personally reviewed the radiological images as listed and agreed with the findings in the report. No results found.     I discussed the assessment and treatment plan with the patient. The patient was provided an opportunity to ask questions and all were answered. The patient agreed with the plan and demonstrated an understanding of the instructions.   The patient was advised to call back or seek an in-person evaluation if the symptoms worsen or if the condition fails to improve as anticipated.  I provided 22 minutes of non face-to-face telephone visit time during this encounter, and > 50% was spent counseling as documented under my assessment & plan.     Truitt Merle, MD 09/02/22

## 2022-09-03 ENCOUNTER — Telehealth: Payer: Self-pay | Admitting: Hematology

## 2022-09-03 NOTE — Telephone Encounter (Signed)
Patient aware of upcoming appointments  

## 2022-09-04 ENCOUNTER — Inpatient Hospital Stay: Payer: Medicare HMO

## 2022-09-04 VITALS — BP 117/77 | HR 77 | Temp 98.9°F | Resp 18

## 2022-09-04 DIAGNOSIS — C7B02 Secondary carcinoid tumors of liver: Secondary | ICD-10-CM | POA: Diagnosis not present

## 2022-09-04 DIAGNOSIS — Z7189 Other specified counseling: Secondary | ICD-10-CM

## 2022-09-04 DIAGNOSIS — C7B8 Other secondary neuroendocrine tumors: Secondary | ICD-10-CM

## 2022-09-04 DIAGNOSIS — N644 Mastodynia: Secondary | ICD-10-CM | POA: Diagnosis not present

## 2022-09-04 DIAGNOSIS — Z7902 Long term (current) use of antithrombotics/antiplatelets: Secondary | ICD-10-CM | POA: Diagnosis not present

## 2022-09-04 DIAGNOSIS — C7A00 Malignant carcinoid tumor of unspecified site: Secondary | ICD-10-CM | POA: Diagnosis not present

## 2022-09-04 DIAGNOSIS — Z8546 Personal history of malignant neoplasm of prostate: Secondary | ICD-10-CM | POA: Diagnosis not present

## 2022-09-04 DIAGNOSIS — Z79899 Other long term (current) drug therapy: Secondary | ICD-10-CM | POA: Diagnosis not present

## 2022-09-04 DIAGNOSIS — Z923 Personal history of irradiation: Secondary | ICD-10-CM | POA: Diagnosis not present

## 2022-09-04 MED ORDER — LANREOTIDE ACETATE 120 MG/0.5ML ~~LOC~~ SOLN
120.0000 mg | Freq: Once | SUBCUTANEOUS | Status: AC
  Administered 2022-09-04: 120 mg via SUBCUTANEOUS
  Filled 2022-09-04: qty 120

## 2022-10-01 ENCOUNTER — Ambulatory Visit: Payer: Medicare HMO

## 2022-10-01 ENCOUNTER — Ambulatory Visit
Admission: RE | Admit: 2022-10-01 | Discharge: 2022-10-01 | Disposition: A | Discharge status: Home / Self Care | Payer: Medicare HMO | Source: Ambulatory Visit | Attending: Hematology | Admitting: Hematology

## 2022-10-01 DIAGNOSIS — C7B8 Other secondary neuroendocrine tumors: Secondary | ICD-10-CM

## 2022-10-01 DIAGNOSIS — C61 Malignant neoplasm of prostate: Secondary | ICD-10-CM

## 2022-10-01 DIAGNOSIS — N62 Hypertrophy of breast: Secondary | ICD-10-CM | POA: Diagnosis not present

## 2022-10-02 ENCOUNTER — Other Ambulatory Visit: Payer: Self-pay

## 2022-10-02 ENCOUNTER — Inpatient Hospital Stay: Payer: Medicare HMO | Attending: Nurse Practitioner

## 2022-10-02 ENCOUNTER — Encounter: Payer: Self-pay | Admitting: Hematology

## 2022-10-02 VITALS — BP 137/76 | HR 85 | Temp 98.5°F | Resp 16

## 2022-10-02 DIAGNOSIS — C61 Malignant neoplasm of prostate: Secondary | ICD-10-CM | POA: Insufficient documentation

## 2022-10-02 DIAGNOSIS — Z7189 Other specified counseling: Secondary | ICD-10-CM

## 2022-10-02 DIAGNOSIS — C7B02 Secondary carcinoid tumors of liver: Secondary | ICD-10-CM | POA: Insufficient documentation

## 2022-10-02 DIAGNOSIS — C7B8 Other secondary neuroendocrine tumors: Secondary | ICD-10-CM

## 2022-10-02 DIAGNOSIS — C7A00 Malignant carcinoid tumor of unspecified site: Secondary | ICD-10-CM | POA: Insufficient documentation

## 2022-10-02 DIAGNOSIS — N63 Unspecified lump in unspecified breast: Secondary | ICD-10-CM

## 2022-10-02 DIAGNOSIS — Z79818 Long term (current) use of other agents affecting estrogen receptors and estrogen levels: Secondary | ICD-10-CM | POA: Diagnosis not present

## 2022-10-02 MED ORDER — LANREOTIDE ACETATE 120 MG/0.5ML ~~LOC~~ SOLN
120.0000 mg | Freq: Once | SUBCUTANEOUS | Status: AC
  Administered 2022-10-02: 120 mg via SUBCUTANEOUS
  Filled 2022-10-02: qty 120

## 2022-10-02 NOTE — Patient Instructions (Signed)
Lanreotide Injection What is this medication? LANREOTIDE (lan REE oh tide) treats high levels of growth hormone (acromegaly). It is used when other therapies have not worked well enough or cannot be tolerated. It works by reducing the amount of growth hormone your body makes. This reduces symptoms and the risk of health problems caused by too much growth hormone, such as diabetes and heart disease. It may also be used to treat neuroendocrine tumors, a cancer of the cells that release hormones and other substances in your body. It works by slowing down the release of these substances from the cells. This slows tumor growth. It also decreases the symptoms of carcinoid syndrome, such as flushing or diarrhea. This medicine may be used for other purposes; ask your health care provider or pharmacist if you have questions. COMMON BRAND NAME(S): Somatuline [place] What should I tell my care team before I take this medication? They need to know if you have any of these conditions: Diabetes Gallbladder disease Heart disease Kidney disease Liver disease Thyroid disease An unusual or allergic reaction to lanreotide, other medications, foods, dyes, or preservatives Pregnant or trying to get pregnant Breast-feeding How should I use this medication? This medication is injected under the skin. It is given by your care team in a hospital or clinic setting. Talk to your care team about the use of this medication in children. Special care may be needed. Overdosage: If you think you have taken too much of this medicine contact a poison control center or emergency room at once. NOTE: This medicine is only for you. Do not share this medicine with others. What if I miss a dose? Keep appointments for follow-up doses. It is important not to miss your dose. Call your care team if you are unable to keep an appointment. What may interact with this medication? Bromocriptine Cyclosporine Certain medications for blood  pressure, heart disease, irregular heartbeat Certain medications for diabetes Quinidine Terfenadine This list may not describe all possible interactions. Give your health care provider a list of all the medicines, herbs, non-prescription drugs, or dietary supplements you use. Also tell them if you smoke, drink alcohol, or use illegal drugs. Some items may interact with your medicine. What should I watch for while using this medication? Visit your care team for regular checks on your progress. Tell your care team if your symptoms do not start to get better or if they get worse. Your condition will be monitored carefully while you are receiving this medication. You may need blood work while you are taking this medication. This medication may increase blood sugar. The risk may be higher in patients who already have diabetes. Ask your care team what you can do to lower your risk of diabetes while taking this medication. Talk to your care team if you wish to become pregnant or think you may be pregnant. This medication can cause serious birth defects. Do not breast-feed while taking this medication and for 6 months after stopping therapy. This medication may cause infertility. Talk to your care team if you are concerned about your fertility. What side effects may I notice from receiving this medication? Side effects that you should report to your care team as soon as possible: Allergic reactions--skin rash, itching, hives, swelling of the face, lips, tongue, or throat Gallbladder problems--severe stomach pain, nausea, vomiting, fever High blood sugar (hyperglycemia)--increased thirst or amount of urine, unusual weakness or fatigue, blurry vision Increase in blood pressure Low blood sugar (hypoglycemia)--tremors or shaking, anxiety, sweating, cold   or clammy skin, confusion, dizziness, rapid heartbeat Low thyroid levels (hypothyroidism)--unusual weakness or fatigue, increased sensitivity to cold,  constipation, hair loss, dry skin, weight gain, feelings of depression Slow heartbeat--dizziness, feeling faint or lightheaded, confusion, trouble breathing, unusual weakness or fatigue Side effects that usually do not require medical attention (report to your care team if they continue or are bothersome): Diarrhea Dizziness Headache Muscle spasms Nausea Pain, redness, irritation, or bruising at the injection site Stomach pain This list may not describe all possible side effects. Call your doctor for medical advice about side effects. You may report side effects to FDA at 1-800-FDA-1088. Where should I keep my medication? This medication is given in a hospital or clinic. It will not be stored at home. NOTE: This sheet is a summary. It may not cover all possible information. If you have questions about this medicine, talk to your doctor, pharmacist, or health care provider.  2023 Elsevier/Gold Standard (2021-10-18 00:00:00)  

## 2022-10-05 NOTE — Progress Notes (Unsigned)
Patient Care Team: Ryan Wilkerson, L.Ryan Sa, MD as PCP - General (Family Medicine) Ryan Blanks, MD as PCP - Cardiology (Cardiology) Ryan Finner, RN (Inactive) as Oncology Nurse Navigator Ryan Merle, MD as Consulting Physician (Oncology) Ryan Feeling, NP as Nurse Practitioner (Nurse Practitioner) Ryan Frock, MD as Consulting Physician (Urology) Ryan Pita, MD as Consulting Physician (Radiation Oncology) Imaging, Ryan Wilkerson as Consulting Physician (Diagnostic Radiology)   I connected with Ryan Wilkerson on 10/06/22 at 11:00 AM EST by telephone visit and verified that I am speaking with Ryan correct person using two identifiers.   I discussed Ryan limitations, risks, security and privacy concerns of performing an evaluation and management service by telemedicine and Ryan availability of in-person appointments. I also discussed with Ryan patient that there may be a patient responsible charge related to this service. Ryan patient expressed understanding and agreed to proceed.   Other persons participating in Ryan visit and their role in Ryan encounter: Spouse   Patient's location: Home  Provider's location: [place] office   CHIEF COMPLAINT: Review mammogram results, f/up breast pain in pt with metastatic neuroendocrine tumor  Oncology History Overview Note  Cancer Staging No matching staging information was found for Ryan patient.    Malignant neoplasm of prostate ([place])  08/31/2017 Initial Diagnosis   Malignant neoplasm of prostate ([place])   10/28/2017 Genetic Testing   Ryan patient had genetic testing due to a personal history of prostate cancer and a family history of breast and stomach cancer.  Ryan Common Hereditary Cancers Panel + Prostate Cancer Panel was ordered.  APC, ATM, AXIN2, BARD1, BMPR1A, BRCA1, BRCA2, BRIP1, CDH1, CDK4, CDKN2A (p14ARF), CDKN2A (p16INK4a), CHEK2, CTNNA1, DICER1, EPCAM*, FANCA, GREM1*, KIT, MEN1, MLH1, MSH2, MSH3, MSH6,  MUTYH, NBN, NF1, PALB2, PDGFRA, PMS2, POLD1, POLE, PTEN, RAD50, RAD51C, RAD51D, SDHB, SDHC, SDHD, SMAD4, SMARCA4, STK11, TP53, TSC1, TSC2, VHL. Ryan following genes were evaluated for sequence changes only: HOXB13*, NTHL1*, SDHA.   Results: Negative, no pathogenic variants identified. Ryan date of this test report is 10/28/2017.    01/05/2018 - 02/12/2018 Radiation Therapy   S/p radiation to Ryan prostate 70 Gy in 28 fractions from 01/05/2018-02/12/2018 per Dr. Tammi Klippel   01/31/2021 Imaging   CT A/P  IMPRESSION: 1. Interval decrease in size of hypodense lesions of Ryan inferior right lobe of Ryan liver and posterior liver dome findings are consistent with treatment response of metastatic lesions. 2. No evidence of new metastatic disease in Ryan abdomen or pelvis. 3. Prostatomegaly.   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  Upper normal 9 mm short axis lymph node in Ryan adjacent mesentery 2.  1.6 x 1.4 cm heterogeneous, poorly defined lesion in Ryan inferior right liver suspicious for metastatic disease, Ryan differential includes GI primary or metastatic prostate cancer No retroperitoneal or pelvic sidewall lymphadenopathy   04/01/2020 PET scan   IMPRESSION: 1. Central mesenteric mass with intense radiotracer activity consistent well differentiated neuroendocrine tumor 2. Three well differentiated neuroendocrine tumor metastasis to Ryan liver. 3. Small peritoneal/nodal metastasis along Ryan LEFT iliac vessels. 4. Very small lesion in Ryan pancreas with intermediate activity could represent primary lesion. No primary bowel lesion identified. 5. Small metastatic implant within Ryan pericardium adjacent LEFT heart ventricle. 6. No skeletal  metastasis.  04/30/2020 Pathology Results   FINAL MICROSCOPIC DIAGNOSIS:  A. LIVER, BIOPSY:  - Metastatic well-differentiated (G1) neuroendocrine tumor to Ryan liver COMMENT:  Immunohistochemical stains show that Ryan tumor cells are positive for  synaptophysin, chromogranin, CD56 and CDX2.  Tumor cells are negative  for TTF-1.  Ryan findings are consistent with neuroendocrine tumor of  gastrointestinal origin.  Ki-67 stain shows a proliferative index of  about 1%, consistent with well-differentiated, grade 1 tumor.    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 Held after cycle 1 due to suspected pericarditis and deconditioning.           --Changed to monthly Lanreotide on 10/18/20.     01/31/2021 Imaging   CT A/P  IMPRESSION: 1. Interval decrease in size of hypodense lesions of Ryan inferior right lobe of Ryan liver and posterior liver dome findings are consistent with treatment response of metastatic lesions. 2. No evidence of new metastatic disease in Ryan abdomen or pelvis. 3. Prostatomegaly.   07/29/2021 Imaging   EXAM: CT CHEST, ABDOMEN, AND PELVIS WITH CONTRAST  IMPRESSION: 1. Slight interval increase in size of Ryan 2 liver metastases. No new liver lesions evident. 2. Stable mild lymphadenopathy in Ryan central small bowel mesentery. 3. 3 mm right upper lobe pulmonary nodule. Attention on follow-up recommended. 4. 4 cm ascending thoracic aortic aneurysm, increased from 3.5 cm on 05/12/2020. Recommend annual imaging followup by CTA or MRA. This recommendation follows 2010 ACCF/AHA/AATS/ACR/ASA/SCA/SCAI/SIR/STS/SVM Guidelines for Ryan Diagnosis and Management of Patients with Thoracic Aortic Disease. Circulation. 2010; 121: A630-Z601. Aortic aneurysm NOS (ICD10-I71.9)  5. Aortic Atherosclerosis (ICD10-I70.0).     CURRENT THERAPY: Monthly  lanreotide injections for metastatic NET, starting 05/2020, held for 5 months due to pericarditis; resumed 10/18/20  INTERVAL HISTORY  [name] presents by phone to discuss recent mammogram. Last seen by Dr. Burr Medico 09/02/22 at which time he reported breast pain after starting androgen deprivation in ~07/2022 by urology for prostate cancer. Exam revealed a 2-3 cm right breast mass. Diagnostic mammo showed significant gynecomastia, no other abnormality or concern for malignancy. He is doing well today, pain is not bothersome and some days he forgets about it until he brushes against something then has transient pain. Otherwise no complaints. Constipation has improved.   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 [facility])    Nocturia    Pericardial effusion    Pericarditis    Prostate cancer [facility]) urologist-- dr Tresa Moore /  oncologist-- dr manning   prostate bx 06-24-2016 and 09-15-2017 at dr Tresa Moore office  Stage T1c, Gleason 4+3, PSA 8.48, vol 33cc---  planned external beam radiation therpay   Tobacco abuse    Urgency of urination    Wears glasses      Past Surgical History:  Procedure Laterality Date   COLONOSCOPY  last one 06/ 2018   GOLD SEED IMPLANT N/A 12/23/2017  Procedure: GOLD SEED IMPLANT;  Surgeon: Ryan Frock, MD;  Location: [facility];  Service: Urology;  Laterality: N/A;   LEFT HEART CATH AND CORONARY ANGIOGRAPHY N/A 05/12/2020   Procedure: LEFT HEART CATH AND CORONARY ANGIOGRAPHY;  Surgeon: Ryan Blanks, MD;   Location: [facility];  Service: Cardiovascular;  Laterality: N/A;   PROSTATE BIOPSY  06-24-2016;  09-15-2017-- at dr Tresa Moore office   [place] N/A 12/23/2017   Procedure: [place];  Surgeon: Ryan Frock, MD;  Location: [facility];  Service: Urology;  Laterality: N/A;     Outpatient Encounter Medications as of 10/06/2022  Medication Sig   acetaminophen (TYLENOL) 500 MG tablet Take 1,000 mg by mouth every 6 (six) hours as needed for headache (pain).    clopidogrel (PLAVIX) 75 MG tablet Take 75 mg by mouth daily.    dicyclomine (BENTYL) 10 MG capsule Take 1 capsule (10 mg total) by mouth every 8 (eight) hours as needed for spasms.   fluticasone (FLONASE) 50 MCG/ACT nasal spray Place 2 sprays into both nostrils daily.   loratadine (CLARITIN) 10 MG tablet Take 10 mg by mouth every morning.    OVER Ryan COUNTER MEDICATION Place 1 drop into both eyes daily as needed (dry eyes). Over Ryan counter lubricating eye drop    pantoprazole (PROTONIX) 40 MG tablet Take 1 tablet (40 mg total) by mouth daily.   polyethylene glycol (MIRALAX / GLYCOLAX) 17 g packet Take 17 g by mouth 2 (two) times daily.   SYMBICORT 160-4.5 MCG/ACT inhaler Inhale 1-2 puffs into Ryan lungs as needed.   traMADol (ULTRAM) 50 MG tablet Take 1-2 tablets (50-100 mg total) by mouth every 6 (six) hours as needed for moderate pain or severe pain.   No facility-administered encounter medications on file as of 10/06/2022.     There were no vitals filed for this visit. There is no height or weight on file to calculate BMI.   PHYSICAL EXAM No PE on today's virtual visit. Voice strong, speech is clear. 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 Tresa Moore -exam by Dr. Burr Medico 09/02/22 showed  2-3 cm mass in right breast -I reviewed mammogram which shows gynecomastia, no suspicion for malignancy. This is likely related to hormone therapy for prostate ca -this SE is tolerable, will defer management to Dr. Tresa Moore  Prostate cancer -S/p radiation to Ryan prostate 70 Gy in 28 fractions from 01/05/18 - 02/12/18 per Dr. Tammi Klippel -PSMA PET scan on 06/06/22 showed intensely radiotracer-avid iliac and retroperitoneal nodes; no activity within prostate gland, evidence of metastatic adenopathy outside of abdomen/pelvis, visceral metastasis, or skeletal metastasis.  -Began androgen deprivation therapy by Dr. Tresa Moore ~07/2022?  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 DOTATATE PET on 04/09/20 showed intense uptake in Ryan central mesenteric mass with 3 liver metastases and small peritoneal/nodal metastatic deposits along Ryan left iliac vessels. There is no primary bowel lesion identified.   -His 04/30/20 liver biopsy showed metastatic well differentiated/G1 neuroendocrine tumor to Ryan liver. --He began first line Sandostatin 20 mg on 05/10/20 s/p 1 dose, treatment held for pericarditis; he switched to Lanreotide  $'120mg'x$  every 4 weeks starting 10/18/20, tolerating well -Restaging dotatate PET scan from 08/2022 showed stable disease. Continue current therapy, next inj 2/29 and f/up 3/28 as scheduled    PLAN: -Mammogram reviewed, no further work up needed -Will cc mammo and note to Dr. Tresa Moore -Next lanreotide inj 2/29, then f/up and inj 3/28 as scheduled    I discussed Ryan assessment and treatment plan with Ryan patient. Ryan patient was provided an opportunity to ask questions and all were answered. Ryan patient agreed with Ryan plan and demonstrated an understanding of Ryan instructions. Ryan total time spent in Ryan appointment was 10 minutes and more than 50% was on  counseling, review of test results, and coordination of care.   Cira Rue, NP-C 10/06/2022

## 2022-10-06 ENCOUNTER — Inpatient Hospital Stay (HOSPITAL_BASED_OUTPATIENT_CLINIC_OR_DEPARTMENT_OTHER): Payer: Medicare HMO | Admitting: Nurse Practitioner

## 2022-10-06 ENCOUNTER — Encounter: Payer: Self-pay | Admitting: Nurse Practitioner

## 2022-10-06 DIAGNOSIS — C61 Malignant neoplasm of prostate: Secondary | ICD-10-CM

## 2022-10-06 DIAGNOSIS — C7B8 Other secondary neuroendocrine tumors: Secondary | ICD-10-CM

## 2022-10-29 ENCOUNTER — Other Ambulatory Visit: Payer: Self-pay

## 2022-10-29 DIAGNOSIS — C7B8 Other secondary neuroendocrine tumors: Secondary | ICD-10-CM

## 2022-10-29 DIAGNOSIS — C61 Malignant neoplasm of prostate: Secondary | ICD-10-CM

## 2022-10-30 ENCOUNTER — Inpatient Hospital Stay: Payer: Medicare HMO

## 2022-10-30 ENCOUNTER — Other Ambulatory Visit: Payer: Self-pay

## 2022-10-30 ENCOUNTER — Inpatient Hospital Stay: Payer: Medicare HMO | Admitting: Hematology

## 2022-10-30 VITALS — BP 121/70 | HR 97 | Temp 98.9°F | Resp 16

## 2022-10-30 DIAGNOSIS — C7B02 Secondary carcinoid tumors of liver: Secondary | ICD-10-CM | POA: Diagnosis not present

## 2022-10-30 DIAGNOSIS — C7B8 Other secondary neuroendocrine tumors: Secondary | ICD-10-CM

## 2022-10-30 DIAGNOSIS — Z7189 Other specified counseling: Secondary | ICD-10-CM

## 2022-10-30 DIAGNOSIS — C61 Malignant neoplasm of prostate: Secondary | ICD-10-CM

## 2022-10-30 DIAGNOSIS — C7A00 Malignant carcinoid tumor of unspecified site: Secondary | ICD-10-CM | POA: Diagnosis not present

## 2022-10-30 DIAGNOSIS — Z79818 Long term (current) use of other agents affecting estrogen receptors and estrogen levels: Secondary | ICD-10-CM | POA: Diagnosis not present

## 2022-10-30 LAB — CBC WITH DIFFERENTIAL (CANCER CENTER ONLY)
Abs Immature Granulocytes: 0.02 10*3/uL (ref 0.00–0.07)
Basophils Absolute: 0 10*3/uL (ref 0.0–0.1)
Basophils Relative: 1 %
Eosinophils Absolute: 0.2 10*3/uL (ref 0.0–0.5)
Eosinophils Relative: 4 %
HCT: 33.7 % — ABNORMAL LOW (ref 39.0–52.0)
Hemoglobin: 11.1 g/dL — ABNORMAL LOW (ref 13.0–17.0)
Immature Granulocytes: 0 %
Lymphocytes Relative: 28 %
Lymphs Abs: 1.5 10*3/uL (ref 0.7–4.0)
MCH: 30.7 pg (ref 26.0–34.0)
MCHC: 32.9 g/dL (ref 30.0–36.0)
MCV: 93.1 fL (ref 80.0–100.0)
Monocytes Absolute: 0.5 10*3/uL (ref 0.1–1.0)
Monocytes Relative: 8 %
Neutro Abs: 3.2 10*3/uL (ref 1.7–7.7)
Neutrophils Relative %: 59 %
Platelet Count: 203 10*3/uL (ref 150–400)
RBC: 3.62 MIL/uL — ABNORMAL LOW (ref 4.22–5.81)
RDW: 11.7 % (ref 11.5–15.5)
WBC Count: 5.5 10*3/uL (ref 4.0–10.5)
nRBC: 0 % (ref 0.0–0.2)

## 2022-10-30 LAB — CMP (CANCER CENTER ONLY)
ALT: 18 U/L (ref 0–44)
AST: 23 U/L (ref 15–41)
Albumin: 3.8 g/dL (ref 3.5–5.0)
Alkaline Phosphatase: 61 U/L (ref 38–126)
Anion gap: 6 (ref 5–15)
BUN: 12 mg/dL (ref 8–23)
CO2: 31 mmol/L (ref 22–32)
Calcium: 8.5 mg/dL — ABNORMAL LOW (ref 8.9–10.3)
Chloride: 102 mmol/L (ref 98–111)
Creatinine: 1.02 mg/dL (ref 0.61–1.24)
GFR, Estimated: >60 mL/min (ref >60)
Glucose, Bld: 96 mg/dL (ref 70–99)
Potassium: 3.4 mmol/L — ABNORMAL LOW (ref 3.5–5.1)
Sodium: 139 mmol/L (ref 135–145)
Total Bilirubin: 0.3 mg/dL (ref 0.3–1.2)
Total Protein: 6.2 g/dL — ABNORMAL LOW (ref 6.5–8.1)

## 2022-10-30 MED ORDER — LANREOTIDE ACETATE 120 MG/0.5ML ~~LOC~~ SOLN
120.0000 mg | Freq: Once | SUBCUTANEOUS | Status: AC
  Administered 2022-10-30: 120 mg via SUBCUTANEOUS
  Filled 2022-10-30: qty 120

## 2022-10-30 NOTE — Patient Instructions (Signed)
Lanreotide Injection What is this medication? LANREOTIDE (lan REE oh tide) treats high levels of growth hormone (acromegaly). It is used when other therapies have not worked well enough or cannot be tolerated. It works by reducing the amount of growth hormone your body makes. This reduces symptoms and the risk of health problems caused by too much growth hormone, such as diabetes and heart disease. It may also be used to treat neuroendocrine tumors, a cancer of the cells that release hormones and other substances in your body. It works by slowing down the release of these substances from the cells. This slows tumor growth. It also decreases the symptoms of carcinoid syndrome, such as flushing or diarrhea. This medicine may be used for other purposes; ask your health care provider or pharmacist if you have questions. COMMON BRAND NAME(S): Somatuline [place] What should I tell my care team before I take this medication? They need to know if you have any of these conditions: Diabetes Gallbladder disease Heart disease Kidney disease Liver disease Thyroid disease An unusual or allergic reaction to lanreotide, other medications, foods, dyes, or preservatives Pregnant or trying to get pregnant Breast-feeding How should I use this medication? This medication is injected under the skin. It is given by your care team in a hospital or clinic setting. Talk to your care team about the use of this medication in children. Special care may be needed. Overdosage: If you think you have taken too much of this medicine contact a poison control center or emergency room at once. NOTE: This medicine is only for you. Do not share this medicine with others. What if I miss a dose? Keep appointments for follow-up doses. It is important not to miss your dose. Call your care team if you are unable to keep an appointment. What may interact with this medication? Bromocriptine Cyclosporine Certain medications for blood  pressure, heart disease, irregular heartbeat Certain medications for diabetes Quinidine Terfenadine This list may not describe all possible interactions. Give your health care provider a list of all the medicines, herbs, non-prescription drugs, or dietary supplements you use. Also tell them if you smoke, drink alcohol, or use illegal drugs. Some items may interact with your medicine. What should I watch for while using this medication? Visit your care team for regular checks on your progress. Tell your care team if your symptoms do not start to get better or if they get worse. Your condition will be monitored carefully while you are receiving this medication. You may need blood work while you are taking this medication. This medication may increase blood sugar. The risk may be higher in patients who already have diabetes. Ask your care team what you can do to lower your risk of diabetes while taking this medication. Talk to your care team if you wish to become pregnant or think you may be pregnant. This medication can cause serious birth defects. Do not breast-feed while taking this medication and for 6 months after stopping therapy. This medication may cause infertility. Talk to your care team if you are concerned about your fertility. What side effects may I notice from receiving this medication? Side effects that you should report to your care team as soon as possible: Allergic reactions--skin rash, itching, hives, swelling of the face, lips, tongue, or throat Gallbladder problems--severe stomach pain, nausea, vomiting, fever High blood sugar (hyperglycemia)--increased thirst or amount of urine, unusual weakness or fatigue, blurry vision Increase in blood pressure Low blood sugar (hypoglycemia)--tremors or shaking, anxiety, sweating, cold   or clammy skin, confusion, dizziness, rapid heartbeat Low thyroid levels (hypothyroidism)--unusual weakness or fatigue, increased sensitivity to cold,  constipation, hair loss, dry skin, weight gain, feelings of depression Slow heartbeat--dizziness, feeling faint or lightheaded, confusion, trouble breathing, unusual weakness or fatigue Side effects that usually do not require medical attention (report to your care team if they continue or are bothersome): Diarrhea Dizziness Headache Muscle spasms Nausea Pain, redness, irritation, or bruising at the injection site Stomach pain This list may not describe all possible side effects. Call your doctor for medical advice about side effects. You may report side effects to FDA at 1-800-FDA-1088. Where should I keep my medication? This medication is given in a hospital or clinic. It will not be stored at home. NOTE: This sheet is a summary. It may not cover all possible information. If you have questions about this medicine, talk to your doctor, pharmacist, or health care provider.  2023 Elsevier/Gold Standard (2021-10-18 00:00:00)  

## 2022-11-03 DIAGNOSIS — J309 Allergic rhinitis, unspecified: Secondary | ICD-10-CM | POA: Diagnosis not present

## 2022-11-03 DIAGNOSIS — I700 Atherosclerosis of aorta: Secondary | ICD-10-CM | POA: Diagnosis not present

## 2022-11-03 DIAGNOSIS — C7B8 Other secondary neuroendocrine tumors: Secondary | ICD-10-CM | POA: Diagnosis not present

## 2022-11-03 DIAGNOSIS — C61 Malignant neoplasm of prostate: Secondary | ICD-10-CM | POA: Diagnosis not present

## 2022-11-03 DIAGNOSIS — I10 Essential (primary) hypertension: Secondary | ICD-10-CM | POA: Diagnosis not present

## 2022-11-03 DIAGNOSIS — K219 Gastro-esophageal reflux disease without esophagitis: Secondary | ICD-10-CM | POA: Diagnosis not present

## 2022-11-03 DIAGNOSIS — K5900 Constipation, unspecified: Secondary | ICD-10-CM | POA: Diagnosis not present

## 2022-11-03 DIAGNOSIS — E7800 Pure hypercholesterolemia, unspecified: Secondary | ICD-10-CM | POA: Diagnosis not present

## 2022-11-03 DIAGNOSIS — Z0000 Encounter for general adult medical examination without abnormal findings: Secondary | ICD-10-CM | POA: Diagnosis not present

## 2022-11-04 ENCOUNTER — Telehealth: Payer: Self-pay | Admitting: Family Medicine

## 2022-11-04 NOTE — Telephone Encounter (Signed)
Reached out to reschedule patient, provider out of office for original appointment. Patient aware of time and date change.

## 2022-11-25 DIAGNOSIS — S46011A Strain of muscle(s) and tendon(s) of the rotator cuff of right shoulder, initial encounter: Secondary | ICD-10-CM | POA: Diagnosis not present

## 2022-11-27 ENCOUNTER — Ambulatory Visit: Payer: Medicare HMO | Admitting: Hematology

## 2022-11-27 ENCOUNTER — Other Ambulatory Visit: Payer: Medicare HMO

## 2022-11-27 ENCOUNTER — Ambulatory Visit: Payer: Medicare HMO

## 2022-12-01 NOTE — Progress Notes (Unsigned)
Patient Care Team: Alroy Dust, L.Marlou Sa, MD as PCP - General (Family Medicine) Burnell Blanks, MD as PCP - Cardiology (Cardiology) Jonnie Finner, RN (Inactive) as Oncology Nurse Navigator Truitt Merle, MD as Consulting Physician (Oncology) Alla Feeling, NP as Nurse Practitioner (Nurse Practitioner) Alexis Frock, MD as Consulting Physician (Urology) Tyler Pita, MD as Consulting Physician (Radiation Oncology) Imaging, [place] as Consulting Physician (Diagnostic Radiology)   CHIEF COMPLAINT: Follow up metastatic NET  Oncology History Overview Note  Cancer Staging No matching staging information was found for the patient.    Malignant neoplasm of prostate  08/31/2017 Initial Diagnosis   Malignant neoplasm of prostate ([place])   10/28/2017 Genetic Testing   The patient had genetic testing due to a personal history of prostate cancer and a family history of breast and stomach cancer.  The Common Hereditary Cancers Panel + Prostate Cancer Panel was ordered.  APC, ATM, AXIN2, BARD1, BMPR1A, BRCA1, BRCA2, BRIP1, CDH1, CDK4, CDKN2A (p14ARF), CDKN2A (p16INK4a), CHEK2, CTNNA1, DICER1, EPCAM*, FANCA, GREM1*, KIT, MEN1, MLH1, MSH2, MSH3, MSH6, MUTYH, NBN, NF1, PALB2, PDGFRA, PMS2, POLD1, POLE, PTEN, RAD50, RAD51C, RAD51D, SDHB, SDHC, SDHD, SMAD4, SMARCA4, STK11, TP53, TSC1, TSC2, VHL. The following genes were evaluated for sequence changes only: HOXB13*, NTHL1*, SDHA.   Results: Negative, no pathogenic variants identified. The date of this test report is 10/28/2017.    01/05/2018 - 02/12/2018 Radiation Therapy   S/p radiation to the prostate 70 Gy in 28 fractions from 01/05/2018-02/12/2018 per Dr. Tammi Klippel   01/31/2021 Imaging   CT A/P  IMPRESSION: 1. Interval decrease in size of hypodense lesions of the inferior right lobe of the liver and posterior liver dome findings are consistent with treatment response of metastatic lesions. 2. No evidence of new  metastatic disease in the abdomen or pelvis. 3. Prostatomegaly.   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  Upper normal 9 mm short axis lymph node in the adjacent mesentery 2.  1.6 x 1.4 cm heterogeneous, poorly defined lesion in the inferior right liver suspicious for metastatic disease, the differential includes GI primary or metastatic prostate cancer No retroperitoneal or pelvic sidewall lymphadenopathy   04/01/2020 PET scan   IMPRESSION: 1. Central mesenteric mass with intense radiotracer activity consistent well differentiated neuroendocrine tumor 2. 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 3. Small peritoneal/nodal metastasis along the LEFT iliac vessels. 4. Very small lesion in the pancreas with intermediate activity could represent primary lesion. No primary bowel lesion identified. 5. Small metastatic implant within the pericardium adjacent LEFT heart ventricle. 6. No skeletal metastasis.   04/30/2020 Pathology Results   FINAL MICROSCOPIC DIAGNOSIS:  A. 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  Tumor cells are negative  for TTF-1.  The findings are consistent with neuroendocrine tumor of  gastrointestinal origin.  Ki-67 stain shows a proliferative index of  about 1%, consistent with well-differentiated, grade 1 tumor.    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 Held after cycle 1  due to suspected pericarditis and  deconditioning.           --Changed to monthly Lanreotide on 10/18/20.     01/31/2021 Imaging   CT A/P  IMPRESSION: 1. Interval decrease in size of hypodense lesions of the inferior right lobe of the liver and posterior liver dome findings are consistent with treatment response of metastatic lesions. 2. No evidence of new metastatic disease in the abdomen or pelvis. 3. Prostatomegaly.   07/29/2021 Imaging   EXAM: CT CHEST, ABDOMEN, AND PELVIS WITH CONTRAST  IMPRESSION: 1. Slight interval increase in size of the 2 liver metastases. No new liver lesions evident. 2. Stable mild lymphadenopathy in the central small bowel mesentery. 3. 3 mm right upper lobe pulmonary nodule. Attention on follow-up recommended. 4. 4 cm ascending thoracic aortic aneurysm, increased from 3.5 cm on 05/12/2020. Recommend annual imaging followup by CTA or MRA. This recommendation follows 2010 ACCF/AHA/AATS/ACR/ASA/SCA/SCAI/SIR/STS/SVM Guidelines for the Diagnosis and Management of Patients with Thoracic Aortic Disease. Circulation. 2010; [id number]. Aortic aneurysm NOS (ICD10-I71.9)  5. Aortic Atherosclerosis (ICD10-I70.0).      CURRENT THERAPY: Lanreotide 120 mg q4 weeks, starting 05/10/2020  INTERVAL HISTORY Mr. Easter returns for follow up as scheduled. He continues monthly lanreotide injections, tolerating well. Abdominal/LLQ pain and cramping has significantly improved on lanreotide, now occasional. Bentyl helps and he would like a refill. Takes miralax daily to stay regular. He has hot flashes and weight gain since starting lupron. Appetite and energy are good. He has phlegm in his throat and morning cough. Once is clears he is fine all day. Recently was switched from claritin to allergra. Takes PPI daily AM, and inhaler every once in a while. Denies any other new/specific complaints  ROS  All other systems reviewed and negative.   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 [facility])    Nocturia    Pericardial effusion    Pericarditis    Prostate cancer [facility]) urologist-- dr Tresa Moore /  oncologist-- dr manning   prostate bx 06-24-2016 and 09-15-2017 at dr Tresa Moore office  Stage T1c, Gleason 4+3, PSA 8.48, vol 33cc---  planned external beam radiation therpay   Tobacco abuse    Urgency of urination    Wears glasses      Past Surgical History:  Procedure Laterality Date   COLONOSCOPY  last one 06/ 2018   GOLD SEED IMPLANT N/A 12/23/2017   Procedure: GOLD SEED IMPLANT;  Surgeon: Alexis Frock, MD;  Location: [facility];  Service: Urology;  Laterality: N/A;   LEFT HEART CATH AND CORONARY ANGIOGRAPHY N/A 05/12/2020   Procedure: LEFT HEART CATH AND CORONARY ANGIOGRAPHY;  Surgeon: Burnell Blanks, MD;  Location: [place] CV LAB;  Service: Cardiovascular;  Laterality: N/A;   PROSTATE BIOPSY  06-24-2016;  09-15-2017-- at dr Tresa Moore office   [place] N/A 12/23/2017   Procedure: [place];  Surgeon: Alexis Frock, MD;  Location: [facility];  Service: Urology;  Laterality: N/A;     Outpatient Encounter Medications as of 12/02/2022  Medication Sig  acetaminophen (TYLENOL) 500 MG tablet Take 1,000 mg by mouth every 6 (six) hours as needed for headache (pain).    clopidogrel (PLAVIX) 75 MG tablet Take 75 mg by mouth daily.    fluticasone (FLONASE) 50 MCG/ACT nasal spray Place 2 sprays into both nostrils daily.   OVER THE COUNTER MEDICATION Place 1 drop into both eyes daily as needed (dry eyes). Over the  counter lubricating eye drop    pantoprazole (PROTONIX) 40 MG tablet Take 1 tablet (40 mg total) by mouth daily.   polyethylene glycol (MIRALAX / GLYCOLAX) 17 g packet Take 17 g by mouth 2 (two) times daily.   SYMBICORT 160-4.5 MCG/ACT inhaler Inhale 1-2 puffs into the lungs as needed.   traMADol (ULTRAM) 50 MG tablet Take 1-2 tablets (50-100 mg total) by mouth every 6 (six) hours as needed for moderate pain or severe pain.   [DISCONTINUED] dicyclomine (BENTYL) 10 MG capsule Take 1 capsule (10 mg total) by mouth every 8 (eight) hours as needed for spasms.   dicyclomine (BENTYL) 10 MG capsule Take 1 capsule (10 mg total) by mouth every 8 (eight) hours as needed for spasms.   loratadine (CLARITIN) 10 MG tablet Take 10 mg by mouth every morning.  (Patient not taking: Reported on 12/02/2022)   No facility-administered encounter medications on file as of 12/02/2022.     Today's Vitals   12/02/22 1042  BP: (!) 143/71  Pulse: 79  Resp: 13  Temp: 98.4 F (36.9 C)  TempSrc: Oral  SpO2: 100%  Weight: 169 lb 4.8 oz (76.8 kg)   Body mass index is 25.74 kg/m.   PHYSICAL EXAM GENERAL:alert, no distress and comfortable SKIN: no rash  EYES: sclera clear NECK: without mass LYMPH:  no palpable cervical or supraclavicular lymphadenopathy  LUNGS: inspiratory wheeze in left lung; otherwise clear with normal breathing effort HEART: regular rate & rhythm, no lower extremity edema ABDOMEN: abdomen soft, non-tender and normal bowel sounds NEURO: alert & oriented x 3 with fluent speech, no focal motor/sensory deficits   CBC    Component Value Date/Time   WBC 5.4 12/02/2022 1029   WBC 10.2 11/24/2020 0327   RBC 3.74 (L) 12/02/2022 1029   HGB 11.6 (L) 12/02/2022 1029   HCT 34.4 (L) 12/02/2022 1029   PLT 220 12/02/2022 1029   MCV 92.0 12/02/2022 1029   MCH 31.0 12/02/2022 1029   MCHC 33.7 12/02/2022 1029   RDW 11.9 12/02/2022 1029   LYMPHSABS 1.6 12/02/2022 1029   MONOABS 0.5 12/02/2022 1029    EOSABS 0.3 12/02/2022 1029   BASOSABS 0.0 12/02/2022 1029     CMP     Component Value Date/Time   NA 140 12/02/2022 1029   K 4.0 12/02/2022 1029   CL 105 12/02/2022 1029   CO2 30 12/02/2022 1029   GLUCOSE 103 (H) 12/02/2022 1029   BUN 14 12/02/2022 1029   CREATININE 1.12 12/02/2022 1029   CALCIUM 9.5 12/02/2022 1029   PROT 7.0 12/02/2022 1029   ALBUMIN 3.8 12/02/2022 1029   AST 23 12/02/2022 1029   ALT 21 12/02/2022 1029   ALKPHOS 62 12/02/2022 1029   BILITOT 0.4 12/02/2022 1029   GFRNONAA >60 12/02/2022 1029   GFRAA >60 05/21/2020 1423     ASSESSMENT & PLAN: 77 yo male with metastatic NET and prostate cancer   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 DOTATATE PET on 04/09/20 showed intense uptake in the central mesenteric mass with 3 liver metastases and small peritoneal/nodal metastatic deposits along the left iliac vessels. There is no primary bowel lesion identified.   -His 04/30/20 liver biopsy showed metastatic well differentiated/G1 neuroendocrine tumor to the liver. -He began first line Sandostatin 20 mg on 05/10/20 s/p 1 dose, treatment held for pericarditis; he switched to Lanreotide 120mg  every 4 weeks starting 10/18/20, tolerating well -Restaging dotatate PET scan from 08/2022 showed stable disease. Continue current therapy -Mr. Dellaporta appears well. Tolerating lanreotide without significant side effects. Abdominal pain has improved on therapy c/w treatment response. He continues bentyl and miralax for GI health. No clinical evidence of progression -Labs reviewed, adequate for lanreotide today as scheduled. -Continue monthly injections, f/up in 4 months or sooner if needed  2. Sputum and wheezing -He has phlegm in the morning which clears with cough. I hear mild wheezing on exam. He uses inhaler PRN -Recently switched  claritin to allegra. May need to switch back or alternative. No high suspicion for infection -I recommend to use inhaler today when he gets home, and monitor. Let us know if symptoms worsen or fail to improve  3. Prostate cancer -S/p radiation to the prostate 70 Gy in 28 fractions from 01/05/18 - 02/12/18 per Dr. Tammi Klippel -PSMA PET scan on 06/06/22 showed intensely radiotracer-avid iliac and retroperitoneal nodes; no activity within prostate gland, evidence of metastatic adenopathy outside of abdomen/pelvis, visceral metastasis, or skeletal metastasis.  -Began androgen deprivation therapy by Dr. Tresa Moore ~07/2022? He developed breast pain, mammo was negative -He reports weight gain and hot flashes.     PLAN: -Labs reviewed -proceed with lanreotide today, continue q4 weeks -Symptom management for sputum/wheezing -F/up in 4 months, or sooner if needed; will order dotatate scan on next visit    All questions were answered. The patient knows to call the clinic with any problems, questions or concerns. No barriers to learning were detected. I spent 20 minutes counseling the patient face to face. The total time spent in the appointment was 30 minutes and more than 50% was on counseling, review of test results, and coordination of care.   Cira Rue, NP-C 12/02/2022

## 2022-12-02 ENCOUNTER — Inpatient Hospital Stay: Payer: Medicare HMO

## 2022-12-02 ENCOUNTER — Encounter: Payer: Self-pay | Admitting: Nurse Practitioner

## 2022-12-02 ENCOUNTER — Inpatient Hospital Stay (HOSPITAL_BASED_OUTPATIENT_CLINIC_OR_DEPARTMENT_OTHER): Payer: Medicare HMO | Admitting: Nurse Practitioner

## 2022-12-02 ENCOUNTER — Inpatient Hospital Stay: Payer: Medicare HMO | Attending: Nurse Practitioner

## 2022-12-02 ENCOUNTER — Other Ambulatory Visit: Payer: Self-pay

## 2022-12-02 VITALS — BP 143/71 | HR 79 | Temp 98.4°F | Resp 13 | Wt 169.3 lb

## 2022-12-02 DIAGNOSIS — C7B8 Other secondary neuroendocrine tumors: Secondary | ICD-10-CM

## 2022-12-02 DIAGNOSIS — Z8673 Personal history of transient ischemic attack (TIA), and cerebral infarction without residual deficits: Secondary | ICD-10-CM | POA: Insufficient documentation

## 2022-12-02 DIAGNOSIS — Z7951 Long term (current) use of inhaled steroids: Secondary | ICD-10-CM | POA: Diagnosis not present

## 2022-12-02 DIAGNOSIS — C7B02 Secondary carcinoid tumors of liver: Secondary | ICD-10-CM | POA: Diagnosis not present

## 2022-12-02 DIAGNOSIS — Z7902 Long term (current) use of antithrombotics/antiplatelets: Secondary | ICD-10-CM | POA: Diagnosis not present

## 2022-12-02 DIAGNOSIS — C7A00 Malignant carcinoid tumor of unspecified site: Secondary | ICD-10-CM | POA: Insufficient documentation

## 2022-12-02 DIAGNOSIS — C61 Malignant neoplasm of prostate: Secondary | ICD-10-CM | POA: Diagnosis not present

## 2022-12-02 DIAGNOSIS — Z923 Personal history of irradiation: Secondary | ICD-10-CM | POA: Diagnosis not present

## 2022-12-02 DIAGNOSIS — R062 Wheezing: Secondary | ICD-10-CM | POA: Insufficient documentation

## 2022-12-02 DIAGNOSIS — I10 Essential (primary) hypertension: Secondary | ICD-10-CM | POA: Insufficient documentation

## 2022-12-02 DIAGNOSIS — Z79899 Other long term (current) drug therapy: Secondary | ICD-10-CM | POA: Diagnosis not present

## 2022-12-02 DIAGNOSIS — E7800 Pure hypercholesterolemia, unspecified: Secondary | ICD-10-CM | POA: Diagnosis not present

## 2022-12-02 DIAGNOSIS — Z7189 Other specified counseling: Secondary | ICD-10-CM

## 2022-12-02 DIAGNOSIS — Z8546 Personal history of malignant neoplasm of prostate: Secondary | ICD-10-CM | POA: Insufficient documentation

## 2022-12-02 DIAGNOSIS — R059 Cough, unspecified: Secondary | ICD-10-CM | POA: Insufficient documentation

## 2022-12-02 LAB — CMP (CANCER CENTER ONLY)
ALT: 21 U/L (ref 0–44)
AST: 23 U/L (ref 15–41)
Albumin: 3.8 g/dL (ref 3.5–5.0)
Alkaline Phosphatase: 62 U/L (ref 38–126)
Anion gap: 5 (ref 5–15)
BUN: 14 mg/dL (ref 8–23)
CO2: 30 mmol/L (ref 22–32)
Calcium: 9.5 mg/dL (ref 8.9–10.3)
Chloride: 105 mmol/L (ref 98–111)
Creatinine: 1.12 mg/dL (ref 0.61–1.24)
GFR, Estimated: >60 mL/min (ref >60)
Glucose, Bld: 103 mg/dL — ABNORMAL HIGH (ref 70–99)
Potassium: 4 mmol/L (ref 3.5–5.1)
Sodium: 140 mmol/L (ref 135–145)
Total Bilirubin: 0.4 mg/dL (ref 0.3–1.2)
Total Protein: 7 g/dL (ref 6.5–8.1)

## 2022-12-02 LAB — CBC WITH DIFFERENTIAL (CANCER CENTER ONLY)
Abs Immature Granulocytes: 0.01 10*3/uL (ref 0.00–0.07)
Basophils Absolute: 0 10*3/uL (ref 0.0–0.1)
Basophils Relative: 1 %
Eosinophils Absolute: 0.3 10*3/uL (ref 0.0–0.5)
Eosinophils Relative: 5 %
HCT: 34.4 % — ABNORMAL LOW (ref 39.0–52.0)
Hemoglobin: 11.6 g/dL — ABNORMAL LOW (ref 13.0–17.0)
Immature Granulocytes: 0 %
Lymphocytes Relative: 29 %
Lymphs Abs: 1.6 10*3/uL (ref 0.7–4.0)
MCH: 31 pg (ref 26.0–34.0)
MCHC: 33.7 g/dL (ref 30.0–36.0)
MCV: 92 fL (ref 80.0–100.0)
Monocytes Absolute: 0.5 10*3/uL (ref 0.1–1.0)
Monocytes Relative: 9 %
Neutro Abs: 3.1 10*3/uL (ref 1.7–7.7)
Neutrophils Relative %: 56 %
Platelet Count: 220 10*3/uL (ref 150–400)
RBC: 3.74 MIL/uL — ABNORMAL LOW (ref 4.22–5.81)
RDW: 11.9 % (ref 11.5–15.5)
WBC Count: 5.4 10*3/uL (ref 4.0–10.5)
nRBC: 0 % (ref 0.0–0.2)

## 2022-12-02 MED ORDER — DICYCLOMINE HCL 10 MG PO CAPS: 10.0000 mg | ORAL_CAPSULE | Freq: Three times a day (TID) | ORAL | 1 refills | Status: AC | PRN

## 2022-12-02 MED ORDER — LANREOTIDE ACETATE 120 MG/0.5ML ~~LOC~~ SOLN
120.0000 mg | Freq: Once | SUBCUTANEOUS | Status: AC
  Administered 2022-12-02: 120 mg via SUBCUTANEOUS
  Filled 2022-12-02: qty 120

## 2022-12-02 NOTE — Patient Instructions (Signed)
Lanreotide Injection What is this medication? LANREOTIDE (lan REE oh tide) treats high levels of growth hormone (acromegaly). It is used when other therapies have not worked well enough or cannot be tolerated. It works by reducing the amount of growth hormone your body makes. This reduces symptoms and the risk of health problems caused by too much growth hormone, such as diabetes and heart disease. It may also be used to treat neuroendocrine tumors, a cancer of the cells that release hormones and other substances in your body. It works by slowing down the release of these substances from the cells. This slows tumor growth. It also decreases the symptoms of carcinoid syndrome, such as flushing or diarrhea. This medicine may be used for other purposes; ask your health care provider or pharmacist if you have questions. COMMON BRAND NAME(S): Somatuline [place] What should I tell my care team before I take this medication? They need to know if you have any of these conditions: Diabetes Gallbladder disease Heart disease Kidney disease Liver disease Thyroid disease An unusual or allergic reaction to lanreotide, other medications, foods, dyes, or preservatives Pregnant or trying to get pregnant Breast-feeding How should I use this medication? This medication is injected under the skin. It is given by your care team in a hospital or clinic setting. Talk to your care team about the use of this medication in children. Special care may be needed. Overdosage: If you think you have taken too much of this medicine contact a poison control center or emergency room at once. NOTE: This medicine is only for you. Do not share this medicine with others. What if I miss a dose? Keep appointments for follow-up doses. It is important not to miss your dose. Call your care team if you are unable to keep an appointment. What may interact with this medication? Bromocriptine Cyclosporine Certain medications for blood  pressure, heart disease, irregular heartbeat Certain medications for diabetes Quinidine Terfenadine This list may not describe all possible interactions. Give your health care provider a list of all the medicines, herbs, non-prescription drugs, or dietary supplements you use. Also tell them if you smoke, drink alcohol, or use illegal drugs. Some items may interact with your medicine. What should I watch for while using this medication? Visit your care team for regular checks on your progress. Tell your care team if your symptoms do not start to get better or if they get worse. Your condition will be monitored carefully while you are receiving this medication. You may need blood work while you are taking this medication. This medication may increase blood sugar. The risk may be higher in patients who already have diabetes. Ask your care team what you can do to lower your risk of diabetes while taking this medication. Talk to your care team if you wish to become pregnant or think you may be pregnant. This medication can cause serious birth defects. Do not breast-feed while taking this medication and for 6 months after stopping therapy. This medication may cause infertility. Talk to your care team if you are concerned about your fertility. What side effects may I notice from receiving this medication? Side effects that you should report to your care team as soon as possible: Allergic reactions--skin rash, itching, hives, swelling of the face, lips, tongue, or throat Gallbladder problems--severe stomach pain, nausea, vomiting, fever High blood sugar (hyperglycemia)--increased thirst or amount of urine, unusual weakness or fatigue, blurry vision Increase in blood pressure Low blood sugar (hypoglycemia)--tremors or shaking, anxiety, sweating, cold   or clammy skin, confusion, dizziness, rapid heartbeat Low thyroid levels (hypothyroidism)--unusual weakness or fatigue, increased sensitivity to cold,  constipation, hair loss, dry skin, weight gain, feelings of depression Slow heartbeat--dizziness, feeling faint or lightheaded, confusion, trouble breathing, unusual weakness or fatigue Side effects that usually do not require medical attention (report to your care team if they continue or are bothersome): Diarrhea Dizziness Headache Muscle spasms Nausea Pain, redness, irritation, or bruising at the injection site Stomach pain This list may not describe all possible side effects. Call your doctor for medical advice about side effects. You may report side effects to FDA at 1-800-FDA-1088. Where should I keep my medication? This medication is given in a hospital or clinic. It will not be stored at home. NOTE: This sheet is a summary. It may not cover all possible information. If you have questions about this medicine, talk to your doctor, pharmacist, or health care provider.  2023 Elsevier/Gold Standard (2021-10-18 00:00:00)  

## 2022-12-03 ENCOUNTER — Telehealth: Payer: Self-pay | Admitting: Hematology

## 2022-12-03 NOTE — Telephone Encounter (Signed)
Contacted patient to scheduled appointments. Patient is aware of appointments that are scheduled.   

## 2022-12-04 DIAGNOSIS — M25511 Pain in right shoulder: Secondary | ICD-10-CM | POA: Diagnosis not present

## 2022-12-16 DIAGNOSIS — M25511 Pain in right shoulder: Secondary | ICD-10-CM | POA: Diagnosis not present

## 2022-12-25 DIAGNOSIS — C61 Malignant neoplasm of prostate: Secondary | ICD-10-CM | POA: Diagnosis not present

## 2022-12-31 ENCOUNTER — Inpatient Hospital Stay: Payer: Medicare HMO

## 2022-12-31 ENCOUNTER — Inpatient Hospital Stay: Payer: Medicare HMO | Attending: Nurse Practitioner

## 2022-12-31 ENCOUNTER — Other Ambulatory Visit: Payer: Self-pay

## 2022-12-31 VITALS — BP 142/84 | HR 70 | Resp 18

## 2022-12-31 DIAGNOSIS — C7B02 Secondary carcinoid tumors of liver: Secondary | ICD-10-CM | POA: Diagnosis not present

## 2022-12-31 DIAGNOSIS — Z7189 Other specified counseling: Secondary | ICD-10-CM

## 2022-12-31 DIAGNOSIS — C7B8 Other secondary neuroendocrine tumors: Secondary | ICD-10-CM

## 2022-12-31 DIAGNOSIS — C7A00 Malignant carcinoid tumor of unspecified site: Secondary | ICD-10-CM | POA: Insufficient documentation

## 2022-12-31 DIAGNOSIS — C61 Malignant neoplasm of prostate: Secondary | ICD-10-CM

## 2022-12-31 LAB — CMP (CANCER CENTER ONLY)
ALT: 21 U/L (ref 0–44)
AST: 20 U/L (ref 15–41)
Albumin: 4 g/dL (ref 3.5–5.0)
Alkaline Phosphatase: 63 U/L (ref 38–126)
Anion gap: 4 — ABNORMAL LOW (ref 5–15)
BUN: 21 mg/dL (ref 8–23)
CO2: 29 mmol/L (ref 22–32)
Calcium: 8.7 mg/dL — ABNORMAL LOW (ref 8.9–10.3)
Chloride: 104 mmol/L (ref 98–111)
Creatinine: 1.2 mg/dL (ref 0.61–1.24)
GFR, Estimated: >60 mL/min (ref >60)
Glucose, Bld: 92 mg/dL (ref 70–99)
Potassium: 4.1 mmol/L (ref 3.5–5.1)
Sodium: 137 mmol/L (ref 135–145)
Total Bilirubin: 0.4 mg/dL (ref 0.3–1.2)
Total Protein: 6.8 g/dL (ref 6.5–8.1)

## 2022-12-31 LAB — CBC WITH DIFFERENTIAL (CANCER CENTER ONLY)
Abs Immature Granulocytes: 0.05 10*3/uL (ref 0.00–0.07)
Basophils Absolute: 0.1 10*3/uL (ref 0.0–0.1)
Basophils Relative: 1 %
Eosinophils Absolute: 0.2 10*3/uL (ref 0.0–0.5)
Eosinophils Relative: 2 %
HCT: 36.4 % — ABNORMAL LOW (ref 39.0–52.0)
Hemoglobin: 11.6 g/dL — ABNORMAL LOW (ref 13.0–17.0)
Immature Granulocytes: 1 %
Lymphocytes Relative: 17 %
Lymphs Abs: 1.6 10*3/uL (ref 0.7–4.0)
MCH: 30.3 pg (ref 26.0–34.0)
MCHC: 31.9 g/dL (ref 30.0–36.0)
MCV: 95 fL (ref 80.0–100.0)
Monocytes Absolute: 0.8 10*3/uL (ref 0.1–1.0)
Monocytes Relative: 9 %
Neutro Abs: 6.7 10*3/uL (ref 1.7–7.7)
Neutrophils Relative %: 70 %
Platelet Count: 213 10*3/uL (ref 150–400)
RBC: 3.83 MIL/uL — ABNORMAL LOW (ref 4.22–5.81)
RDW: 12.1 % (ref 11.5–15.5)
WBC Count: 9.4 10*3/uL (ref 4.0–10.5)
nRBC: 0 % (ref 0.0–0.2)

## 2022-12-31 MED ORDER — LANREOTIDE ACETATE 120 MG/0.5ML ~~LOC~~ SOLN
120.0000 mg | Freq: Once | SUBCUTANEOUS | Status: AC
  Administered 2022-12-31: 120 mg via SUBCUTANEOUS
  Filled 2022-12-31: qty 120

## 2023-01-01 DIAGNOSIS — C775 Secondary and unspecified malignant neoplasm of intrapelvic lymph nodes: Secondary | ICD-10-CM | POA: Diagnosis not present

## 2023-01-01 DIAGNOSIS — R3915 Urgency of urination: Secondary | ICD-10-CM | POA: Diagnosis not present

## 2023-01-01 DIAGNOSIS — C61 Malignant neoplasm of prostate: Secondary | ICD-10-CM | POA: Diagnosis not present

## 2023-01-20 DIAGNOSIS — C61 Malignant neoplasm of prostate: Secondary | ICD-10-CM | POA: Diagnosis not present

## 2023-01-28 ENCOUNTER — Inpatient Hospital Stay: Payer: Medicare HMO

## 2023-01-28 VITALS — BP 157/86 | HR 78 | Temp 98.8°F | Resp 18

## 2023-01-28 DIAGNOSIS — Z7189 Other specified counseling: Secondary | ICD-10-CM

## 2023-01-28 DIAGNOSIS — C61 Malignant neoplasm of prostate: Secondary | ICD-10-CM

## 2023-01-28 DIAGNOSIS — C7B02 Secondary carcinoid tumors of liver: Secondary | ICD-10-CM | POA: Diagnosis not present

## 2023-01-28 DIAGNOSIS — C7B8 Other secondary neuroendocrine tumors: Secondary | ICD-10-CM

## 2023-01-28 DIAGNOSIS — C7A00 Malignant carcinoid tumor of unspecified site: Secondary | ICD-10-CM | POA: Diagnosis not present

## 2023-01-28 LAB — CMP (CANCER CENTER ONLY)
ALT: 23 U/L (ref 0–44)
AST: 24 U/L (ref 15–41)
Albumin: 3.8 g/dL (ref 3.5–5.0)
Alkaline Phosphatase: 67 U/L (ref 38–126)
Anion gap: 5 (ref 5–15)
BUN: 12 mg/dL (ref 8–23)
CO2: 31 mmol/L (ref 22–32)
Calcium: 9 mg/dL (ref 8.9–10.3)
Chloride: 104 mmol/L (ref 98–111)
Creatinine: 1.14 mg/dL (ref 0.61–1.24)
GFR, Estimated: >60 mL/min (ref >60)
Glucose, Bld: 126 mg/dL — ABNORMAL HIGH (ref 70–99)
Potassium: 3.8 mmol/L (ref 3.5–5.1)
Sodium: 140 mmol/L (ref 135–145)
Total Bilirubin: 0.3 mg/dL (ref 0.3–1.2)
Total Protein: 7 g/dL (ref 6.5–8.1)

## 2023-01-28 LAB — CBC WITH DIFFERENTIAL (CANCER CENTER ONLY)
Abs Immature Granulocytes: 0.05 10*3/uL (ref 0.00–0.07)
Basophils Absolute: 0 10*3/uL (ref 0.0–0.1)
Basophils Relative: 1 %
Eosinophils Absolute: 0.1 10*3/uL (ref 0.0–0.5)
Eosinophils Relative: 1 %
HCT: 34.9 % — ABNORMAL LOW (ref 39.0–52.0)
Hemoglobin: 11.7 g/dL — ABNORMAL LOW (ref 13.0–17.0)
Immature Granulocytes: 1 %
Lymphocytes Relative: 20 %
Lymphs Abs: 1.3 10*3/uL (ref 0.7–4.0)
MCH: 31.3 pg (ref 26.0–34.0)
MCHC: 33.5 g/dL (ref 30.0–36.0)
MCV: 93.3 fL (ref 80.0–100.0)
Monocytes Absolute: 0.6 10*3/uL (ref 0.1–1.0)
Monocytes Relative: 10 %
Neutro Abs: 4.3 10*3/uL (ref 1.7–7.7)
Neutrophils Relative %: 67 %
Platelet Count: 242 10*3/uL (ref 150–400)
RBC: 3.74 MIL/uL — ABNORMAL LOW (ref 4.22–5.81)
RDW: 12.2 % (ref 11.5–15.5)
WBC Count: 6.4 10*3/uL (ref 4.0–10.5)
nRBC: 0 % (ref 0.0–0.2)

## 2023-01-28 MED ORDER — LANREOTIDE ACETATE 120 MG/0.5ML ~~LOC~~ SOLN
120.0000 mg | Freq: Once | SUBCUTANEOUS | Status: AC
  Administered 2023-01-28: 120 mg via SUBCUTANEOUS
  Filled 2023-01-28: qty 120

## 2023-01-30 DIAGNOSIS — H9192 Unspecified hearing loss, left ear: Secondary | ICD-10-CM | POA: Diagnosis not present

## 2023-01-30 DIAGNOSIS — Z0000 Encounter for general adult medical examination without abnormal findings: Secondary | ICD-10-CM | POA: Diagnosis not present

## 2023-02-03 DIAGNOSIS — H748X1 Other specified disorders of right middle ear and mastoid: Secondary | ICD-10-CM | POA: Diagnosis not present

## 2023-02-03 DIAGNOSIS — H6991 Unspecified Eustachian tube disorder, right ear: Secondary | ICD-10-CM | POA: Diagnosis not present

## 2023-02-03 DIAGNOSIS — H9120 Sudden idiopathic hearing loss, unspecified ear: Secondary | ICD-10-CM | POA: Diagnosis not present

## 2023-02-03 DIAGNOSIS — H9193 Unspecified hearing loss, bilateral: Secondary | ICD-10-CM | POA: Diagnosis not present

## 2023-02-04 DIAGNOSIS — H903 Sensorineural hearing loss, bilateral: Secondary | ICD-10-CM | POA: Diagnosis not present

## 2023-02-25 ENCOUNTER — Inpatient Hospital Stay: Payer: Medicare HMO

## 2023-02-25 ENCOUNTER — Inpatient Hospital Stay: Payer: Medicare HMO | Attending: Nurse Practitioner

## 2023-02-25 ENCOUNTER — Other Ambulatory Visit: Payer: Self-pay

## 2023-02-25 VITALS — BP 149/90 | HR 91 | Temp 99.0°F | Resp 16

## 2023-02-25 DIAGNOSIS — C61 Malignant neoplasm of prostate: Secondary | ICD-10-CM

## 2023-02-25 DIAGNOSIS — C7A00 Malignant carcinoid tumor of unspecified site: Secondary | ICD-10-CM | POA: Insufficient documentation

## 2023-02-25 DIAGNOSIS — Z7189 Other specified counseling: Secondary | ICD-10-CM

## 2023-02-25 DIAGNOSIS — C7B02 Secondary carcinoid tumors of liver: Secondary | ICD-10-CM | POA: Insufficient documentation

## 2023-02-25 DIAGNOSIS — C7B8 Other secondary neuroendocrine tumors: Secondary | ICD-10-CM

## 2023-02-25 DIAGNOSIS — Z8546 Personal history of malignant neoplasm of prostate: Secondary | ICD-10-CM | POA: Diagnosis not present

## 2023-02-25 LAB — CBC WITH DIFFERENTIAL (CANCER CENTER ONLY)
Abs Immature Granulocytes: 0.06 10*3/uL (ref 0.00–0.07)
Basophils Absolute: 0 10*3/uL (ref 0.0–0.1)
Basophils Relative: 1 %
Eosinophils Absolute: 0.2 10*3/uL (ref 0.0–0.5)
Eosinophils Relative: 3 %
HCT: 34.8 % — ABNORMAL LOW (ref 39.0–52.0)
Hemoglobin: 11.2 g/dL — ABNORMAL LOW (ref 13.0–17.0)
Immature Granulocytes: 1 %
Lymphocytes Relative: 24 %
Lymphs Abs: 1.6 10*3/uL (ref 0.7–4.0)
MCH: 31.3 pg (ref 26.0–34.0)
MCHC: 32.2 g/dL (ref 30.0–36.0)
MCV: 97.2 fL (ref 80.0–100.0)
Monocytes Absolute: 0.5 10*3/uL (ref 0.1–1.0)
Monocytes Relative: 8 %
Neutro Abs: 4.3 10*3/uL (ref 1.7–7.7)
Neutrophils Relative %: 63 %
Platelet Count: 216 10*3/uL (ref 150–400)
RBC: 3.58 MIL/uL — ABNORMAL LOW (ref 4.22–5.81)
RDW: 12.7 % (ref 11.5–15.5)
WBC Count: 6.7 10*3/uL (ref 4.0–10.5)
nRBC: 0 % (ref 0.0–0.2)

## 2023-02-25 LAB — CMP (CANCER CENTER ONLY)
ALT: 17 U/L (ref 0–44)
AST: 19 U/L (ref 15–41)
Albumin: 3.4 g/dL — ABNORMAL LOW (ref 3.5–5.0)
Alkaline Phosphatase: 55 U/L (ref 38–126)
Anion gap: 6 (ref 5–15)
BUN: 12 mg/dL (ref 8–23)
CO2: 29 mmol/L (ref 22–32)
Calcium: 8.8 mg/dL — ABNORMAL LOW (ref 8.9–10.3)
Chloride: 104 mmol/L (ref 98–111)
Creatinine: 1.1 mg/dL (ref 0.61–1.24)
GFR, Estimated: >60 mL/min (ref >60)
Glucose, Bld: 154 mg/dL — ABNORMAL HIGH (ref 70–99)
Potassium: 4.3 mmol/L (ref 3.5–5.1)
Sodium: 139 mmol/L (ref 135–145)
Total Bilirubin: 0.3 mg/dL (ref 0.3–1.2)
Total Protein: 6.6 g/dL (ref 6.5–8.1)

## 2023-02-25 MED ORDER — LANREOTIDE ACETATE 120 MG/0.5ML ~~LOC~~ SOLN
120.0000 mg | Freq: Once | SUBCUTANEOUS | Status: AC
  Administered 2023-02-25: 120 mg via SUBCUTANEOUS
  Filled 2023-02-25: qty 120

## 2023-03-06 DIAGNOSIS — H748X1 Other specified disorders of right middle ear and mastoid: Secondary | ICD-10-CM | POA: Diagnosis not present

## 2023-03-06 DIAGNOSIS — H903 Sensorineural hearing loss, bilateral: Secondary | ICD-10-CM | POA: Diagnosis not present

## 2023-03-06 DIAGNOSIS — H6991 Unspecified Eustachian tube disorder, right ear: Secondary | ICD-10-CM | POA: Diagnosis not present

## 2023-03-06 DIAGNOSIS — H9312 Tinnitus, left ear: Secondary | ICD-10-CM | POA: Diagnosis not present

## 2023-03-26 ENCOUNTER — Inpatient Hospital Stay: Payer: Medicare HMO

## 2023-03-26 ENCOUNTER — Encounter: Payer: Self-pay | Admitting: Hematology

## 2023-03-26 ENCOUNTER — Inpatient Hospital Stay: Payer: Medicare HMO | Attending: Nurse Practitioner | Admitting: Hematology

## 2023-03-26 VITALS — BP 138/80 | HR 94 | Temp 98.5°F | Resp 18 | Ht 68.0 in | Wt 164.0 lb

## 2023-03-26 DIAGNOSIS — C7A00 Malignant carcinoid tumor of unspecified site: Secondary | ICD-10-CM | POA: Insufficient documentation

## 2023-03-26 DIAGNOSIS — Z923 Personal history of irradiation: Secondary | ICD-10-CM | POA: Insufficient documentation

## 2023-03-26 DIAGNOSIS — C7B8 Other secondary neuroendocrine tumors: Secondary | ICD-10-CM | POA: Diagnosis not present

## 2023-03-26 DIAGNOSIS — Z8546 Personal history of malignant neoplasm of prostate: Secondary | ICD-10-CM | POA: Diagnosis not present

## 2023-03-26 DIAGNOSIS — C61 Malignant neoplasm of prostate: Secondary | ICD-10-CM

## 2023-03-26 DIAGNOSIS — C7B02 Secondary carcinoid tumors of liver: Secondary | ICD-10-CM | POA: Diagnosis not present

## 2023-03-26 DIAGNOSIS — Z7189 Other specified counseling: Secondary | ICD-10-CM

## 2023-03-26 LAB — CBC WITH DIFFERENTIAL (CANCER CENTER ONLY)
Abs Immature Granulocytes: 0.04 10*3/uL (ref 0.00–0.07)
Basophils Absolute: 0 10*3/uL (ref 0.0–0.1)
Basophils Relative: 1 %
Eosinophils Absolute: 0.2 10*3/uL (ref 0.0–0.5)
Eosinophils Relative: 3 %
HCT: 35.2 % — ABNORMAL LOW (ref 39.0–52.0)
Hemoglobin: 12 g/dL — ABNORMAL LOW (ref 13.0–17.0)
Immature Granulocytes: 1 %
Lymphocytes Relative: 23 %
Lymphs Abs: 1.5 10*3/uL (ref 0.7–4.0)
MCH: 31.9 pg (ref 26.0–34.0)
MCHC: 34.1 g/dL (ref 30.0–36.0)
MCV: 93.6 fL (ref 80.0–100.0)
Monocytes Absolute: 0.6 10*3/uL (ref 0.1–1.0)
Monocytes Relative: 9 %
Neutro Abs: 4.1 10*3/uL (ref 1.7–7.7)
Neutrophils Relative %: 63 %
Platelet Count: 250 10*3/uL (ref 150–400)
RBC: 3.76 MIL/uL — ABNORMAL LOW (ref 4.22–5.81)
RDW: 11.9 % (ref 11.5–15.5)
WBC Count: 6.4 10*3/uL (ref 4.0–10.5)
nRBC: 0 % (ref 0.0–0.2)

## 2023-03-26 LAB — CMP (CANCER CENTER ONLY)
ALT: 12 U/L (ref 0–44)
AST: 20 U/L (ref 15–41)
Albumin: 3.6 g/dL (ref 3.5–5.0)
Alkaline Phosphatase: 68 U/L (ref 38–126)
Anion gap: 7 (ref 5–15)
BUN: 12 mg/dL (ref 8–23)
CO2: 28 mmol/L (ref 22–32)
Calcium: 9.3 mg/dL (ref 8.9–10.3)
Chloride: 104 mmol/L (ref 98–111)
Creatinine: 1.16 mg/dL (ref 0.61–1.24)
GFR, Estimated: >60 mL/min (ref >60)
Glucose, Bld: 128 mg/dL — ABNORMAL HIGH (ref 70–99)
Potassium: 4.4 mmol/L (ref 3.5–5.1)
Sodium: 139 mmol/L (ref 135–145)
Total Bilirubin: 0.4 mg/dL (ref 0.3–1.2)
Total Protein: 6.4 g/dL — ABNORMAL LOW (ref 6.5–8.1)

## 2023-03-26 MED ORDER — LANREOTIDE ACETATE 120 MG/0.5ML ~~LOC~~ SOLN
120.0000 mg | Freq: Once | SUBCUTANEOUS | Status: AC
  Administered 2023-03-26: 120 mg via SUBCUTANEOUS
  Filled 2023-03-26: qty 120

## 2023-03-26 NOTE — Assessment & Plan Note (Signed)
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 Tammi Klippel -Followed by Dr. Tresa Moore at [facility] --PSMA PET scan on 06/06/22 showed intensely radiotracer-avid iliac and retroperitoneal nodes; no activity within prostate gland, evidence of metastatic adenopathy outside of abdomen/pelvis, visceral metastasis, or skeletal metastasis.  -he is on ADT now per his urologist

## 2023-03-26 NOTE — Assessment & Plan Note (Addendum)
Most consistent with GI primary with liver and nodal metastasis -Due to rising PSA, CT/bone scan were obtained showing a mesenteric mass and single liver lesion. DOTATATE PET on 04/09/20 showed intense uptake in the central mesenteric mass with 3 liver metastases and small peritoneal/nodal metastatic deposits along the left iliac vessels. There is no primary bowel lesion identified.   -His 04/30/20 liver biopsy showed metastatic well differentiated/G1 neuroendocrine tumor to the liver. --He began first line Sandostatin 20 mg on 05/10/20. However on 05/12/20 he had pericarditis and treatment was held. I changed him to Lanreotide 120mg  every 4 weeks starting 10/18/20  -he is clinically stable, tolerating treatment well, last dotatate PET scan in December 2023 showed stable primary tumor in small bowel, stable metastatic lesions in peritoneum and liver

## 2023-03-26 NOTE — Progress Notes (Signed)
[facility]   [phone] [phone]   Clinic Follow up Note   Patient Care Team: Clovis Riley, L.August Saucer, MD as PCP - General (Family Medicine) Kathleene Hazel, MD as PCP - Cardiology (Cardiology) Radonna Ricker, RN (Inactive) as Oncology Nurse Navigator Malachy Mood, MD as Consulting Physician (Oncology) Pollyann Samples, NP as Nurse Practitioner (Nurse Practitioner) Berneice Heinrich Delbert Phenix., MD as Consulting Physician (Urology) Margaretmary Dys, MD as Consulting Physician (Radiation Oncology) Imaging, [facility] [place] as Consulting Physician (Diagnostic Radiology)  Date of Service:  03/26/2023  CHIEF COMPLAINT: f/u of metastatic NET    CURRENT THERAPY:  Lanreotide 120 mg q4 weeks, starting 05/10/2020   ASSESSMENT:  [facility] is a 77 y.o. 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 DOTATATE PET on 04/09/20 showed intense uptake in the central mesenteric mass with 3 liver metastases and small peritoneal/nodal metastatic deposits along the left iliac vessels. There is no primary bowel lesion identified.   -His 04/30/20 liver biopsy showed metastatic well differentiated/G1 neuroendocrine tumor to the liver. --He began first line Sandostatin 20 mg on 05/10/20. However on 05/12/20 he had pericarditis and treatment was held. 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 Kathrynn Running -Followed by Dr. Berneice Heinrich at [facility] --PSMA PET scan on 06/06/22 showed intensely radiotracer-avid iliac and retroperitoneal nodes; no activity within prostate gland, evidence of metastatic adenopathy outside of abdomen/pelvis, visceral metastasis, or skeletal metastasis.  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 Marene Lenz, and underwent some testing -Waiting for hearing aids at the [facility]  PLAN: -lab reviewed -I order CT cap in 3 months -continue Lanreotide Injection every months -recommend eating lean meat and exercise. -lab and f/u in 3 months  SUMMARY OF ONCOLOGIC HISTORY: Oncology History Overview Note  Cancer Staging No matching staging information was found for the patient.    Malignant neoplasm of prostate (HCC)  08/31/2017 Initial Diagnosis   Malignant neoplasm of prostate (HCC)   10/28/2017 Genetic Testing   The patient had genetic testing due to a personal history of prostate cancer and a family history of breast and stomach cancer.  The Common Hereditary Cancers Panel + Prostate Cancer Panel was ordered.  APC, ATM, AXIN2, BARD1, BMPR1A, BRCA1, BRCA2, BRIP1, CDH1, CDK4, CDKN2A (p14ARF), CDKN2A (p16INK4a), CHEK2, CTNNA1, DICER1, EPCAM*, FANCA, GREM1*, KIT, MEN1, MLH1, MSH2, MSH3, MSH6, MUTYH, NBN, NF1, PALB2, PDGFRA, PMS2, POLD1, POLE, PTEN, RAD50, RAD51C, RAD51D, SDHB, SDHC, SDHD, SMAD4, SMARCA4, STK11, TP53, TSC1, TSC2, VHL. The following genes were evaluated for sequence changes only: HOXB13*, NTHL1*, SDHA.   Results: Negative, no pathogenic variants identified. The date of this test report is 10/28/2017.    01/05/2018 - 02/12/2018 Radiation Therapy   S/p radiation to the prostate 70 Gy in 28 fractions from 01/05/2018-02/12/2018 per Dr. Kathrynn Running   01/31/2021 Imaging  CT A/P  IMPRESSION: 1. Interval decrease in size of hypodense lesions of the inferior right lobe of the liver  and posterior liver dome findings are consistent with treatment response of metastatic lesions. 2. No evidence of new metastatic disease in the abdomen or pelvis. 3. Prostatomegaly.   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  Upper normal 9 mm short axis lymph node in the adjacent mesentery 2.  1.6 x 1.4 cm heterogeneous, poorly defined lesion in the inferior right liver suspicious for metastatic disease, the differential includes GI primary or metastatic prostate cancer No retroperitoneal or pelvic sidewall lymphadenopathy   04/01/2020 PET scan   IMPRESSION: 1. Central mesenteric mass with intense radiotracer activity consistent well differentiated neuroendocrine tumor 2. 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 3. Small peritoneal/nodal metastasis along the LEFT iliac vessels. 4. Very small lesion in the pancreas with intermediate activity could represent primary lesion. No primary bowel lesion identified. 5. Small metastatic implant within the pericardium adjacent LEFT heart ventricle. 6. No skeletal metastasis.   04/30/2020 Pathology Results   FINAL MICROSCOPIC DIAGNOSIS:  A. 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  Tumor cells are negative  for TTF-1.  The findings are consistent with neuroendocrine tumor of  gastrointestinal origin.  Ki-67 stain shows a proliferative index of  about 1%, consistent with well-differentiated, grade 1 tumor.    04/30/2020 Initial Diagnosis   Metastatic malignant neuroendocrine tumor to  liver (HCC)   05/10/2020 -  Chemotherapy   First-line octreotide (Sandostatin) monthly starting 05/10/20. Held after cycle 1 due to suspected pericarditis and deconditioning.           --Changed to monthly Lanreotide on 10/18/20.     01/31/2021 Imaging   CT A/P  IMPRESSION: 1. Interval decrease in size of hypodense lesions of the inferior right lobe of the liver and posterior liver dome findings are consistent with treatment response of metastatic lesions. 2. No evidence of new metastatic disease in the abdomen or pelvis. 3. Prostatomegaly.   07/29/2021 Imaging   EXAM: CT CHEST, ABDOMEN, AND PELVIS WITH CONTRAST  IMPRESSION: 1. Slight interval increase in size of the 2 liver metastases. No new liver lesions evident. 2. Stable mild lymphadenopathy in the central small bowel mesentery. 3. 3 mm right upper lobe pulmonary nodule. Attention on follow-up recommended. 4. 4 cm ascending thoracic aortic aneurysm, increased from 3.5 cm on 05/12/2020. Recommend annual imaging followup by CTA or MRA. This recommendation follows 2010 ACCF/AHA/AATS/ACR/ASA/SCA/SCAI/SIR/STS/SVM Guidelines for the Diagnosis and Management of Patients with Thoracic Aortic Disease. Circulation. 2010; 121: W098-J191. Aortic aneurysm NOS (ICD10-I71.9)  5. Aortic Atherosclerosis (ICD10-I70.0).      INTERVAL HISTORY:  TREAVON CASTILLEJA is here for a follow up of metastatic NET. He was last seen by me on 12/02/2022. He presents to the clinic alone. Pt went to see NET and was told he need hearing aides. Pt state that he has no issues with the  Lanreotide injection. Pt state that he had some bloating this morning when he woke up. Pt denies having any issues with his BM.     All other systems were reviewed  with the patient and are negative.  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 [facility])    Nocturia    Pericardial effusion    Pericarditis    Prostate cancer [facility]) urologist-- dr Berneice Heinrich /  oncologist-- dr manning   prostate bx 06-24-2016 and 09-15-2017 at dr Berneice Heinrich office  Stage T1c, Gleason 4+3, PSA 8.48, vol 33cc---  planned external beam radiation therpay   Tobacco abuse    Urgency of urination    Wears glasses     SURGICAL HISTORY: Past Surgical History:  Procedure Laterality Date   COLONOSCOPY  last one 06/ 2018   GOLD SEED IMPLANT N/A 12/23/2017   Procedure: GOLD SEED IMPLANT;  Surgeon: Sebastian Ache, MD;  Location: [facility];  Service: Urology;  Laterality: N/A;   LEFT HEART CATH AND CORONARY ANGIOGRAPHY N/A 05/12/2020   Procedure: LEFT HEART CATH AND CORONARY ANGIOGRAPHY;  Surgeon: Kathleene Hazel, MD;  Location: [facility] INVAS[facility];  Service: Cardiovascular;  Laterality: N/A;   PROSTATE BIOPSY  06-24-2016;  09-15-2017-- at dr Berneice Heinrich office   SPACE OAR INSTILLATION N/A 12/23/2017   Procedure: SPACE OAR INSTILLATION;  Surgeon: Sebastian Ache, MD;  Location: [facility];  Service: Urology;  Laterality: N/A;    I have reviewed the social history and family history with the patient and they are unchanged from previous note.  ALLERGIES:  is allergic to aspirin, primidone, and simvastatin.  MEDICATIONS:  Current Outpatient Medications  Medication Sig Dispense Refill   acetaminophen (TYLENOL) 500 MG tablet Take 1,000 mg by mouth every 6 (six) hours as needed for headache (pain).      clopidogrel (PLAVIX) 75 MG tablet Take 75 mg by mouth daily.      dicyclomine (BENTYL) 10 MG capsule Take 1 capsule (10 mg total) by mouth every 8 (eight) hours as  needed for spasms. 30 capsule 1   fluticasone (FLONASE) 50 MCG/ACT nasal spray Place 2 sprays into both nostrils daily. 16 g 0   loratadine (CLARITIN) 10 MG tablet Take 10 mg by mouth every morning.  (Patient not taking: Reported on 12/02/2022)     OVER THE COUNTER MEDICATION Place 1 drop into both eyes daily as needed (dry eyes). Over the counter lubricating eye drop      pantoprazole (PROTONIX) 40 MG tablet Take 1 tablet (40 mg total) by mouth daily. 30 tablet 0   polyethylene glycol (MIRALAX / GLYCOLAX) 17 g packet Take 17 g by mouth 2 (two) times daily. 14 each 0   SYMBICORT 160-4.5 MCG/ACT inhaler Inhale 1-2 puffs into the lungs as needed.     traMADol (ULTRAM) 50 MG tablet Take 1-2 tablets (50-100 mg total) by mouth every 6 (six) hours as needed for moderate pain or severe pain. 15 tablet 0   No current facility-administered medications for this visit.    PHYSICAL EXAMINATION: ECOG PERFORMANCE STATUS: 1 - Symptomatic but completely ambulatory  Vitals:   03/26/23 1350  BP: 138/80  Pulse: 94  Resp: 18  Temp: 98.5 F (36.9 C)  SpO2: 100%   Wt Readings from Last 3 Encounters:  03/26/23 164 lb (74.4 kg)  12/02/22 169 lb 4.8 oz (76.8 kg)  08/08/22 160 lb 6.4 oz (72.8 kg)     GENERAL:alert, no distress and comfortable SKIN: skin color normal, no rashes or significant lesions EYES: normal, Conjunctiva are pink and non-injected, sclera clear  NEURO: alert & oriented x 3 with fluent speech  LABORATORY DATA:  I have reviewed the data as listed    Latest Ref Rng & Units 03/26/2023    1:02 PM 02/25/2023   12:58 PM 01/28/2023    1:35 PM  CBC  WBC 4.0 - 10.5 K/uL 6.4  6.7  6.4   Hemoglobin 13.0 - 17.0 g/dL 16.1  09.6  04.5   Hematocrit 39.0 - 52.0 % 35.2  34.8  34.9   Platelets 150 - 400 K/uL 250  216  242         Latest Ref Rng & Units 03/26/2023    1:02 PM 02/25/2023   12:58 PM 01/28/2023    1:35 PM  CMP  Glucose 70 - 99 mg/dL 409  811  914   BUN 8 - 23 mg/dL 12  12  12     Creatinine 0.61 - 1.24 mg/dL 7.82  9.56  2.13   Sodium 135 - 145 mmol/L 139  139  140   Potassium 3.5 - 5.1 mmol/L 4.4  4.3  3.8   Chloride 98 - 111 mmol/L 104  104  104   CO2 22 - 32 mmol/L 28  29  31    Calcium 8.9 - 10.3 mg/dL 9.3  8.8  9.0   Total Protein 6.5 - 8.1 g/dL 6.4  6.6  7.0   Total Bilirubin 0.3 - 1.2 mg/dL 0.4  0.3  0.3   Alkaline Phos 38 - 126 U/L 68  55  67   AST 15 - 41 U/L 20  19  24    ALT 0 - 44 U/L 12  17  23        RADIOGRAPHIC STUDIES: I have personally reviewed the radiological images as listed and agreed with the findings in the report. No results found.    Orders Placed This Encounter  Procedures   CT CHEST ABDOMEN PELVIS W CONTRAST    Standing Status:   Future    Standing Expiration Date:   03/25/2024    Order Specific Question:   If indicated for the ordered procedure, I authorize the administration of contrast media per Radiology protocol    Answer:   Yes    Order Specific Question:   Does the patient have a contrast media/X-ray dye allergy?    Answer:   No    Order Specific Question:   Preferred imaging location?    Answer:   [facility]    Order Specific Question:   Release to patient    Answer:   Immediate    Order Specific Question:   If indicated for the ordered procedure, I authorize the administration of oral contrast media per Radiology protocol    Answer:   Yes   All questions were answered. The patient knows to call the clinic with any problems, questions or concerns. No barriers to learning was detected. The total time spent in the appointment was 25 minutes.     Malachy Mood, MD 03/26/2023   Carolin Coy, CMA, am acting as scribe for Malachy Mood, MD.   I have reviewed  the above documentation for accuracy and completeness, and I agree with the above.

## 2023-03-27 ENCOUNTER — Telehealth: Payer: Self-pay | Admitting: Hematology

## 2023-04-02 DIAGNOSIS — C61 Malignant neoplasm of prostate: Secondary | ICD-10-CM | POA: Diagnosis not present

## 2023-04-09 DIAGNOSIS — C775 Secondary and unspecified malignant neoplasm of intrapelvic lymph nodes: Secondary | ICD-10-CM | POA: Diagnosis not present

## 2023-04-09 DIAGNOSIS — R3915 Urgency of urination: Secondary | ICD-10-CM | POA: Diagnosis not present

## 2023-04-09 DIAGNOSIS — C61 Malignant neoplasm of prostate: Secondary | ICD-10-CM | POA: Diagnosis not present

## 2023-04-22 ENCOUNTER — Inpatient Hospital Stay: Payer: Medicare HMO | Attending: Nurse Practitioner

## 2023-04-22 ENCOUNTER — Other Ambulatory Visit: Payer: Self-pay

## 2023-04-22 ENCOUNTER — Inpatient Hospital Stay: Payer: Medicare HMO

## 2023-04-22 VITALS — BP 140/83 | HR 82 | Temp 98.1°F | Resp 16

## 2023-04-22 DIAGNOSIS — C7A00 Malignant carcinoid tumor of unspecified site: Secondary | ICD-10-CM | POA: Insufficient documentation

## 2023-04-22 DIAGNOSIS — C7B8 Other secondary neuroendocrine tumors: Secondary | ICD-10-CM

## 2023-04-22 DIAGNOSIS — C7B02 Secondary carcinoid tumors of liver: Secondary | ICD-10-CM | POA: Insufficient documentation

## 2023-04-22 DIAGNOSIS — C61 Malignant neoplasm of prostate: Secondary | ICD-10-CM

## 2023-04-22 DIAGNOSIS — Z7189 Other specified counseling: Secondary | ICD-10-CM

## 2023-04-22 LAB — CBC WITH DIFFERENTIAL (CANCER CENTER ONLY)
Abs Immature Granulocytes: 0.02 10*3/uL (ref 0.00–0.07)
Basophils Absolute: 0 10*3/uL (ref 0.0–0.1)
Basophils Relative: 0 %
Eosinophils Absolute: 0.3 10*3/uL (ref 0.0–0.5)
Eosinophils Relative: 5 %
HCT: 35.8 % — ABNORMAL LOW (ref 39.0–52.0)
Hemoglobin: 12 g/dL — ABNORMAL LOW (ref 13.0–17.0)
Immature Granulocytes: 0 %
Lymphocytes Relative: 26 %
Lymphs Abs: 1.5 10*3/uL (ref 0.7–4.0)
MCH: 31.1 pg (ref 26.0–34.0)
MCHC: 33.5 g/dL (ref 30.0–36.0)
MCV: 92.7 fL (ref 80.0–100.0)
Monocytes Absolute: 0.5 10*3/uL (ref 0.1–1.0)
Monocytes Relative: 9 %
Neutro Abs: 3.6 10*3/uL (ref 1.7–7.7)
Neutrophils Relative %: 60 %
Platelet Count: 226 10*3/uL (ref 150–400)
RBC: 3.86 MIL/uL — ABNORMAL LOW (ref 4.22–5.81)
RDW: 11.6 % (ref 11.5–15.5)
WBC Count: 6 10*3/uL (ref 4.0–10.5)
nRBC: 0 % (ref 0.0–0.2)

## 2023-04-22 LAB — CMP (CANCER CENTER ONLY)
ALT: 12 U/L (ref 0–44)
AST: 20 U/L (ref 15–41)
Albumin: 3.6 g/dL (ref 3.5–5.0)
Alkaline Phosphatase: 70 U/L (ref 38–126)
Anion gap: 4 — ABNORMAL LOW (ref 5–15)
BUN: 11 mg/dL (ref 8–23)
CO2: 31 mmol/L (ref 22–32)
Calcium: 8.8 mg/dL — ABNORMAL LOW (ref 8.9–10.3)
Chloride: 103 mmol/L (ref 98–111)
Creatinine: 1.1 mg/dL (ref 0.61–1.24)
GFR, Estimated: >60 mL/min (ref >60)
Glucose, Bld: 111 mg/dL — ABNORMAL HIGH (ref 70–99)
Potassium: 4.1 mmol/L (ref 3.5–5.1)
Sodium: 138 mmol/L (ref 135–145)
Total Bilirubin: 0.3 mg/dL (ref 0.3–1.2)
Total Protein: 6.7 g/dL (ref 6.5–8.1)

## 2023-04-22 MED ORDER — LANREOTIDE ACETATE 120 MG/0.5ML ~~LOC~~ SOLN
120.0000 mg | Freq: Once | SUBCUTANEOUS | Status: AC
  Administered 2023-04-22: 120 mg via SUBCUTANEOUS
  Filled 2023-04-22: qty 120

## 2023-05-20 ENCOUNTER — Inpatient Hospital Stay: Payer: Medicare HMO | Attending: Nurse Practitioner

## 2023-05-20 ENCOUNTER — Inpatient Hospital Stay: Payer: Medicare HMO

## 2023-05-20 VITALS — BP 155/81 | HR 83 | Temp 98.8°F | Resp 18

## 2023-05-20 DIAGNOSIS — Z7189 Other specified counseling: Secondary | ICD-10-CM

## 2023-05-20 DIAGNOSIS — C7B02 Secondary carcinoid tumors of liver: Secondary | ICD-10-CM | POA: Diagnosis not present

## 2023-05-20 DIAGNOSIS — C7A00 Malignant carcinoid tumor of unspecified site: Secondary | ICD-10-CM | POA: Diagnosis not present

## 2023-05-20 DIAGNOSIS — C7B8 Other secondary neuroendocrine tumors: Secondary | ICD-10-CM

## 2023-05-20 DIAGNOSIS — C61 Malignant neoplasm of prostate: Secondary | ICD-10-CM

## 2023-05-20 LAB — CMP (CANCER CENTER ONLY)
ALT: 11 U/L (ref 0–44)
AST: 18 U/L (ref 15–41)
Albumin: 3.6 g/dL (ref 3.5–5.0)
Alkaline Phosphatase: 67 U/L (ref 38–126)
Anion gap: 5 (ref 5–15)
BUN: 13 mg/dL (ref 8–23)
CO2: 29 mmol/L (ref 22–32)
Calcium: 8.9 mg/dL (ref 8.9–10.3)
Chloride: 104 mmol/L (ref 98–111)
Creatinine: 1.19 mg/dL (ref 0.61–1.24)
GFR, Estimated: >60 mL/min (ref >60)
Glucose, Bld: 114 mg/dL — ABNORMAL HIGH (ref 70–99)
Potassium: 4 mmol/L (ref 3.5–5.1)
Sodium: 138 mmol/L (ref 135–145)
Total Bilirubin: 0.3 mg/dL (ref 0.3–1.2)
Total Protein: 6.4 g/dL — ABNORMAL LOW (ref 6.5–8.1)

## 2023-05-20 LAB — CBC WITH DIFFERENTIAL (CANCER CENTER ONLY)
Abs Immature Granulocytes: 0.02 10*3/uL (ref 0.00–0.07)
Basophils Absolute: 0 10*3/uL (ref 0.0–0.1)
Basophils Relative: 1 %
Eosinophils Absolute: 0.3 10*3/uL (ref 0.0–0.5)
Eosinophils Relative: 6 %
HCT: 35.3 % — ABNORMAL LOW (ref 39.0–52.0)
Hemoglobin: 11.7 g/dL — ABNORMAL LOW (ref 13.0–17.0)
Immature Granulocytes: 0 %
Lymphocytes Relative: 26 %
Lymphs Abs: 1.4 10*3/uL (ref 0.7–4.0)
MCH: 30.8 pg (ref 26.0–34.0)
MCHC: 33.1 g/dL (ref 30.0–36.0)
MCV: 92.9 fL (ref 80.0–100.0)
Monocytes Absolute: 0.5 10*3/uL (ref 0.1–1.0)
Monocytes Relative: 10 %
Neutro Abs: 3.1 10*3/uL (ref 1.7–7.7)
Neutrophils Relative %: 57 %
Platelet Count: 217 10*3/uL (ref 150–400)
RBC: 3.8 MIL/uL — ABNORMAL LOW (ref 4.22–5.81)
RDW: 11.5 % (ref 11.5–15.5)
WBC Count: 5.4 10*3/uL (ref 4.0–10.5)
nRBC: 0 % (ref 0.0–0.2)

## 2023-05-20 MED ORDER — LANREOTIDE ACETATE 120 MG/0.5ML ~~LOC~~ SOLN
120.0000 mg | Freq: Once | SUBCUTANEOUS | Status: AC
  Administered 2023-05-20: 120 mg via SUBCUTANEOUS
  Filled 2023-05-20: qty 120

## 2023-06-13 DIAGNOSIS — Z23 Encounter for immunization: Secondary | ICD-10-CM | POA: Diagnosis not present

## 2023-06-15 NOTE — Assessment & Plan Note (Signed)
Most consistent with GI primary with liver and nodal metastasis -Due to rising PSA, CT/bone scan were obtained showing a mesenteric mass and single liver lesion. DOTATATE PET on 04/09/20 showed intense uptake in the central mesenteric mass with 3 liver metastases and small peritoneal/nodal metastatic deposits along the left iliac vessels. There is no primary bowel lesion identified.   -His 04/30/20 liver biopsy showed metastatic well differentiated/G1 neuroendocrine tumor to the liver. --He began first line Sandostatin 20 mg on 05/10/20. However on 05/12/20 he had pericarditis and treatment was held. I changed him to Lanreotide 120mg  every 4 weeks starting 10/18/20  -he is clinically stable, tolerating treatment well, last dotatate PET scan in December 2023 showed stable primary tumor in small bowel, stable metastatic lesions in peritoneum and liver

## 2023-06-16 ENCOUNTER — Inpatient Hospital Stay: Payer: Medicare HMO

## 2023-06-16 ENCOUNTER — Encounter: Payer: Self-pay | Admitting: Hematology

## 2023-06-16 ENCOUNTER — Other Ambulatory Visit: Payer: Self-pay

## 2023-06-16 ENCOUNTER — Inpatient Hospital Stay: Payer: Medicare HMO | Attending: Nurse Practitioner | Admitting: Hematology

## 2023-06-16 VITALS — BP 154/92 | HR 82 | Temp 98.1°F | Resp 18 | Wt 164.1 lb

## 2023-06-16 DIAGNOSIS — Z79899 Other long term (current) drug therapy: Secondary | ICD-10-CM | POA: Diagnosis not present

## 2023-06-16 DIAGNOSIS — K5900 Constipation, unspecified: Secondary | ICD-10-CM | POA: Insufficient documentation

## 2023-06-16 DIAGNOSIS — C7B8 Other secondary neuroendocrine tumors: Secondary | ICD-10-CM

## 2023-06-16 DIAGNOSIS — H9193 Unspecified hearing loss, bilateral: Secondary | ICD-10-CM | POA: Insufficient documentation

## 2023-06-16 DIAGNOSIS — I10 Essential (primary) hypertension: Secondary | ICD-10-CM | POA: Insufficient documentation

## 2023-06-16 DIAGNOSIS — Z7902 Long term (current) use of antithrombotics/antiplatelets: Secondary | ICD-10-CM | POA: Diagnosis not present

## 2023-06-16 DIAGNOSIS — Z7951 Long term (current) use of inhaled steroids: Secondary | ICD-10-CM | POA: Insufficient documentation

## 2023-06-16 DIAGNOSIS — C61 Malignant neoplasm of prostate: Secondary | ICD-10-CM

## 2023-06-16 DIAGNOSIS — Z7189 Other specified counseling: Secondary | ICD-10-CM

## 2023-06-16 DIAGNOSIS — C7B02 Secondary carcinoid tumors of liver: Secondary | ICD-10-CM | POA: Insufficient documentation

## 2023-06-16 DIAGNOSIS — C7A00 Malignant carcinoid tumor of unspecified site: Secondary | ICD-10-CM | POA: Diagnosis not present

## 2023-06-16 LAB — CMP (CANCER CENTER ONLY)
ALT: 12 U/L (ref 0–44)
AST: 22 U/L (ref 15–41)
Albumin: 3.9 g/dL (ref 3.5–5.0)
Alkaline Phosphatase: 77 U/L (ref 38–126)
Anion gap: 5 (ref 5–15)
BUN: 10 mg/dL (ref 8–23)
CO2: 30 mmol/L (ref 22–32)
Calcium: 9.2 mg/dL (ref 8.9–10.3)
Chloride: 104 mmol/L (ref 98–111)
Creatinine: 1.13 mg/dL (ref 0.61–1.24)
GFR, Estimated: >60 mL/min (ref >60)
Glucose, Bld: 123 mg/dL — ABNORMAL HIGH (ref 70–99)
Potassium: 4.4 mmol/L (ref 3.5–5.1)
Sodium: 139 mmol/L (ref 135–145)
Total Bilirubin: 0.4 mg/dL (ref 0.3–1.2)
Total Protein: 7.1 g/dL (ref 6.5–8.1)

## 2023-06-16 LAB — CBC WITH DIFFERENTIAL (CANCER CENTER ONLY)
Abs Immature Granulocytes: 0.02 10*3/uL (ref 0.00–0.07)
Basophils Absolute: 0 10*3/uL (ref 0.0–0.1)
Basophils Relative: 1 %
Eosinophils Absolute: 0.3 10*3/uL (ref 0.0–0.5)
Eosinophils Relative: 6 %
HCT: 38.4 % — ABNORMAL LOW (ref 39.0–52.0)
Hemoglobin: 12.8 g/dL — ABNORMAL LOW (ref 13.0–17.0)
Immature Granulocytes: 0 %
Lymphocytes Relative: 24 %
Lymphs Abs: 1.2 10*3/uL (ref 0.7–4.0)
MCH: 31.1 pg (ref 26.0–34.0)
MCHC: 33.3 g/dL (ref 30.0–36.0)
MCV: 93.4 fL (ref 80.0–100.0)
Monocytes Absolute: 0.5 10*3/uL (ref 0.1–1.0)
Monocytes Relative: 9 %
Neutro Abs: 3.2 10*3/uL (ref 1.7–7.7)
Neutrophils Relative %: 60 %
Platelet Count: 222 10*3/uL (ref 150–400)
RBC: 4.11 MIL/uL — ABNORMAL LOW (ref 4.22–5.81)
RDW: 11.6 % (ref 11.5–15.5)
WBC Count: 5.2 10*3/uL (ref 4.0–10.5)
nRBC: 0 % (ref 0.0–0.2)

## 2023-06-16 MED ORDER — LANREOTIDE ACETATE 120 MG/0.5ML ~~LOC~~ SOLN
120.0000 mg | Freq: Once | SUBCUTANEOUS | Status: AC
  Administered 2023-06-16: 120 mg via SUBCUTANEOUS
  Filled 2023-06-16: qty 120

## 2023-06-16 NOTE — Progress Notes (Signed)
[facility]   [phone] [phone]   Clinic Follow up Note   Patient Care Team: Clovis Riley, L.August Saucer, MD as PCP - General (Family Medicine) Kathleene Hazel, MD as PCP - Cardiology (Cardiology) Radonna Ricker, RN (Inactive) as Oncology Nurse Navigator Malachy Mood, MD as Consulting Physician (Oncology) Pollyann Samples, NP as Nurse Practitioner (Nurse Practitioner) Berneice Heinrich Delbert Phenix., MD as Consulting Physician (Urology) Margaretmary Dys, MD as Consulting Physician (Radiation Oncology) Imaging, [facility] as Consulting Physician (Diagnostic Radiology)  Date of Service:  06/16/2023  CHIEF COMPLAINT: f/u of metastatic neuroendocrine tumor  CURRENT THERAPY:  Lanreotide every 4 weeks  Oncology History   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 DOTATATE PET on 04/09/20 showed intense uptake in the central mesenteric mass with 3 liver metastases and small peritoneal/nodal metastatic deposits along the left iliac vessels. There is no primary bowel lesion identified.   -His 04/30/20 liver biopsy showed metastatic well differentiated/G1 neuroendocrine tumor to the liver. --He began first line Sandostatin 20 mg on 05/10/20. However on 05/12/20 he had pericarditis and treatment was held. I changed him to Lanreotide 120mg  every 4 weeks starting 10/18/20  -he is clinically stable, tolerating treatment well, last dotatate PET scan in December 2023 showed stable primary tumor in small bowel, stable metastatic lesions in peritoneum and liver     Assessment and Plan    Metastatic Neuroendocrine Tumor Stable weight and functional status. Patient is tolerating injections well with no reported side effects. CT scan scheduled for 06/24/2023 to assess disease  progression. -Continue current treatment regimen. -Review CT scan results when available.  Sudden Bilateral Hearing Loss Patient reports sudden onset of hearing loss, possibly due to a stroke. Hearing aids ordered and expected to arrive by the middle of next month. -Continue with plan to use hearing aids upon arrival.  Constipation Patient reports bowel movements twice a week and uses Miralax daily. -Increase Miralax to twice daily to promote more regular bowel movements.  Hypertension Elevated blood pressure noted during visit. Patient reports occasional high readings at home but usually returns to normal range. -Recheck blood pressure and advise patient to continue monitoring at home.  Follow-up in three months.         SUMMARY OF ONCOLOGIC HISTORY: Oncology History Overview Note  Cancer Staging No matching staging information was found for the patient.    Malignant neoplasm of prostate (HCC)  08/31/2017 Initial Diagnosis   Malignant neoplasm of prostate (HCC)   10/28/2017 Genetic Testing   The patient had genetic testing due to a personal history of prostate cancer and a family history of breast and stomach cancer.  The Common Hereditary Cancers Panel + Prostate Cancer Panel was ordered.  APC, ATM, AXIN2, BARD1, BMPR1A, BRCA1, BRCA2, BRIP1, CDH1, CDK4, CDKN2A (p14ARF), CDKN2A (p16INK4a), CHEK2, CTNNA1, DICER1, EPCAM*, FANCA, GREM1*, KIT, MEN1, MLH1, MSH2, MSH3, MSH6, MUTYH, NBN, NF1, PALB2, PDGFRA, PMS2, POLD1, POLE, PTEN, RAD50, RAD51C, RAD51D, SDHB, SDHC, SDHD, SMAD4, SMARCA4, STK11, TP53, TSC1, TSC2, VHL. The following genes were evaluated for sequence changes only: HOXB13*, NTHL1*, SDHA.   Results: Negative, no pathogenic variants identified. The date of this test report is 10/28/2017.    01/05/2018 - 02/12/2018 Radiation Therapy   S/p radiation to the prostate 70 Gy in 28 fractions from 01/05/2018-02/12/2018 per Dr. Kathrynn Running   01/31/2021  Imaging   CT A/P  IMPRESSION: 1.  Interval decrease in size of hypodense lesions of the inferior right lobe of the liver and posterior liver dome findings are consistent with treatment response of metastatic lesions. 2. No evidence of new metastatic disease in the abdomen or pelvis. 3. Prostatomegaly.   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  Upper normal 9 mm short axis lymph node in the adjacent mesentery 2.  1.6 x 1.4 cm heterogeneous, poorly defined lesion in the inferior right liver suspicious for metastatic disease, the differential includes GI primary or metastatic prostate cancer No retroperitoneal or pelvic sidewall lymphadenopathy   04/01/2020 PET scan   IMPRESSION: 1. Central mesenteric mass with intense radiotracer activity consistent well differentiated neuroendocrine tumor 2. 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 3. Small peritoneal/nodal metastasis along the LEFT iliac vessels. 4. Very small lesion in the pancreas with intermediate activity could represent primary lesion. No primary bowel lesion identified. 5. Small metastatic implant within the pericardium adjacent LEFT heart ventricle. 6. No skeletal metastasis.   04/30/2020 Pathology Results   FINAL MICROSCOPIC DIAGNOSIS:  A. 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  Tumor cells are negative  for TTF-1.  The findings are consistent with neuroendocrine tumor of  gastrointestinal origin.  Ki-67 stain shows a proliferative index of  about 1%, consistent with well-differentiated, grade 1  tumor.    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 Held after cycle 1 due to suspected pericarditis and deconditioning.           --Changed to monthly Lanreotide on 10/18/20.     01/31/2021 Imaging   CT A/P  IMPRESSION: 1. Interval decrease in size of hypodense lesions of the inferior right lobe of the liver and posterior liver dome findings are consistent with treatment response of metastatic lesions. 2. No evidence of new metastatic disease in the abdomen or pelvis. 3. Prostatomegaly.   07/29/2021 Imaging   EXAM: CT CHEST, ABDOMEN, AND PELVIS WITH CONTRAST  IMPRESSION: 1. Slight interval increase in size of the 2 liver metastases. No new liver lesions evident. 2. Stable mild lymphadenopathy in the central small bowel mesentery. 3. 3 mm right upper lobe pulmonary nodule. Attention on follow-up recommended. 4. 4 cm ascending thoracic aortic aneurysm, increased from 3.5 cm on 05/12/2020. Recommend annual imaging followup by CTA or MRA. This recommendation follows 2010 ACCF/AHA/AATS/ACR/ASA/SCA/SCAI/SIR/STS/SVM Guidelines for the Diagnosis and Management of Patients with Thoracic Aortic Disease. Circulation. 2010; 121: Z610-R604. Aortic aneurysm NOS (ICD10-I71.9)  5. Aortic Atherosclerosis (ICD10-I70.0).      Discussed the use of AI scribe software for clinical note transcription with the patient, who gave verbal consent to proceed.  History of Present Illness   The patient, a 77 year old with a history of metastatic neuroendocrine tumor, presents with sudden bilateral hearing loss. The patient reports that he went to bed one night with normal hearing and woke up the next morning unable to hear. He has seen an ENT doctor who has ordered hearing aids through the [place],  which are expected to arrive next month. The patient reports that he can hear sounds  but cannot make out what people are saying unless he speaks very loudly. He also reports that he can hear better when he cups his hand over his ear. The patient suspects that he may have had a stroke, which was suggested by an ENT doctor in [place], but the [place] attributed the hearing loss to aging.  The patient also reports fatigue, which sometimes is severe enough that he needs to sit down. Despite this, he is still able to perform his daily activities and even plays golf once a week. He also reports constipation, for which he takes Miralax once a day. He has bowel movements about twice a week.  The patient also mentions chest discomfort and hoarseness, which comes and goes. He uses an inhaler, which he reports helps with these symptoms. He denies any pain, fever, or sore throat.         All other systems were reviewed with the patient and are negative.  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 [facility])    Nocturia    Pericardial effusion    Pericarditis    Prostate cancer [facility]) urologist-- dr Berneice Heinrich /  oncologist-- dr manning   prostate bx 06-24-2016 and 09-15-2017 at dr Berneice Heinrich office  Stage T1c, Gleason 4+3, PSA 8.48, vol 33cc---  planned external beam radiation therpay   Tobacco abuse    Urgency of urination    Wears glasses     SURGICAL HISTORY: Past Surgical History:  Procedure Laterality Date   COLONOSCOPY  last one 06/ 2018   GOLD SEED IMPLANT N/A 12/23/2017   Procedure: GOLD SEED IMPLANT;  Surgeon: Sebastian Ache, MD;  Location: [facility];   Service: Urology;  Laterality: N/A;   LEFT HEART CATH AND CORONARY ANGIOGRAPHY N/A 05/12/2020   Procedure: LEFT HEART CATH AND CORONARY ANGIOGRAPHY;  Surgeon: Kathleene Hazel, MD;  Location: [facility];  Service: Cardiovascular;  Laterality: N/A;   PROSTATE BIOPSY  06-24-2016;  09-15-2017-- at dr Berneice Heinrich office   SPACE OAR INSTILLATION N/A 12/23/2017   Procedure: SPACE OAR INSTILLATION;  Surgeon: Sebastian Ache, MD;  Location: [facility];  Service: Urology;  Laterality: N/A;    I have reviewed the social history and family history with the patient and they are unchanged from previous note.  ALLERGIES:  is allergic to aspirin, primidone, and simvastatin.  MEDICATIONS:  Current Outpatient Medications  Medication Sig Dispense Refill   acetaminophen (TYLENOL) 500 MG tablet Take 1,000 mg by mouth every 6 (six) hours as needed for headache (pain).      clopidogrel (PLAVIX) 75 MG tablet Take 75 mg by mouth daily.      dicyclomine (BENTYL) 10 MG capsule Take 1 capsule (10 mg total) by mouth every 8 (eight) hours as needed for spasms. 30 capsule 1   fluticasone (FLONASE) 50 MCG/ACT nasal spray Place 2 sprays into both nostrils daily. 16 g 0  loratadine (CLARITIN) 10 MG tablet Take 10 mg by mouth every morning.  (Patient not taking: Reported on 12/02/2022)     OVER THE COUNTER MEDICATION Place 1 drop into both eyes daily as needed (dry eyes). Over the counter lubricating eye drop      pantoprazole (PROTONIX) 40 MG tablet Take 1 tablet (40 mg total) by mouth daily. 30 tablet 0   polyethylene glycol (MIRALAX / GLYCOLAX) 17 g packet Take 17 g by mouth 2 (two) times daily. 14 each 0   SYMBICORT 160-4.5 MCG/ACT inhaler Inhale 1-2 puffs into the lungs as needed.     traMADol (ULTRAM) 50 MG tablet Take 1-2 tablets (50-100 mg total) by mouth every 6 (six) hours as needed for moderate pain or severe pain. 15 tablet 0   No current facility-administered medications for this visit.    Facility-Administered Medications Ordered in Other Visits  Medication Dose Route Frequency Provider Last Rate Last Admin   lanreotide acetate (SOMATULINE DEPOT) injection 120 mg  120 mg Subcutaneous Once Malachy Mood, MD        PHYSICAL EXAMINATION: ECOG PERFORMANCE STATUS: 0 - Asymptomatic  Vitals:   06/16/23 1412  BP: (!) 154/92  Pulse: 82  Resp: 18  Temp: 98.1 F (36.7 C)  SpO2: 100%   Wt Readings from Last 3 Encounters:  06/16/23 164 lb 1.6 oz (74.4 kg)  03/26/23 164 lb (74.4 kg)  12/02/22 169 lb 4.8 oz (76.8 kg)     GENERAL:alert, no distress and comfortable SKIN: skin color, texture, turgor are normal, no rashes or significant lesions EYES: normal, Conjunctiva are pink and non-injected, sclera clear NECK: supple, thyroid normal size, non-tender, without nodularity LYMPH:  no palpable lymphadenopathy in the cervical, axillary  LUNGS: clear to auscultation and percussion with normal breathing effort HEART: regular rate & rhythm and no murmurs and no lower extremity edema ABDOMEN:abdomen soft, non-tender and normal bowel sounds Musculoskeletal:no cyanosis of digits and no clubbing  NEURO: alert & oriented x 3 with fluent speech, no focal motor/sensory deficits    LABORATORY DATA:  I have reviewed the data as listed    Latest Ref Rng & Units 06/16/2023    2:03 PM 05/20/2023    1:04 PM 04/22/2023    1:01 PM  CBC  WBC 4.0 - 10.5 K/uL 5.2  5.4  6.0   Hemoglobin 13.0 - 17.0 g/dL 16.1  09.6  04.5   Hematocrit 39.0 - 52.0 % 38.4  35.3  35.8   Platelets 150 - 400 K/uL 222  217  226         Latest Ref Rng & Units 06/16/2023    2:03 PM 05/20/2023    1:04 PM 04/22/2023    1:01 PM  CMP  Glucose 70 - 99 mg/dL 409  811  914   BUN 8 - 23 mg/dL 10  13  11    Creatinine 0.61 - 1.24 mg/dL 7.82  9.56  2.13   Sodium 135 - 145 mmol/L 139  138  138   Potassium 3.5 - 5.1 mmol/L 4.4  4.0  4.1   Chloride 98 - 111 mmol/L 104  104  103   CO2 22 - 32 mmol/L 30  29  31    Calcium 8.9  - 10.3 mg/dL 9.2  8.9  8.8   Total Protein 6.5 - 8.1 g/dL 7.1  6.4  6.7   Total Bilirubin 0.3 - 1.2 mg/dL 0.4  0.3  0.3   Alkaline Phos 38 - 126 U/L 77  67  70   AST 15 - 41 U/L 22  18  20    ALT 0 - 44 U/L 12  11  12        RADIOGRAPHIC STUDIES: I have personally reviewed the radiological images as listed and agreed with the findings in the report. No results found.    No orders of the defined types were placed in this encounter.  All questions were answered. The patient knows to call the clinic with any problems, questions or concerns. No barriers to learning was detected. The total time spent in the appointment was 25 minutes.     Malachy Mood, MD 06/16/2023

## 2023-06-24 ENCOUNTER — Ambulatory Visit (HOSPITAL_COMMUNITY)
Admission: RE | Admit: 2023-06-24 | Discharge: 2023-06-24 | Disposition: A | Discharge status: Home / Self Care | Payer: Medicare HMO | Source: Ambulatory Visit | Attending: Hematology | Admitting: Hematology

## 2023-06-24 DIAGNOSIS — C7B8 Other secondary neuroendocrine tumors: Secondary | ICD-10-CM | POA: Diagnosis not present

## 2023-06-24 DIAGNOSIS — I700 Atherosclerosis of aorta: Secondary | ICD-10-CM | POA: Diagnosis not present

## 2023-06-24 DIAGNOSIS — K769 Liver disease, unspecified: Secondary | ICD-10-CM | POA: Diagnosis not present

## 2023-06-24 DIAGNOSIS — C772 Secondary and unspecified malignant neoplasm of intra-abdominal lymph nodes: Secondary | ICD-10-CM | POA: Diagnosis not present

## 2023-06-24 DIAGNOSIS — R222 Localized swelling, mass and lump, trunk: Secondary | ICD-10-CM | POA: Diagnosis not present

## 2023-06-24 DIAGNOSIS — R160 Hepatomegaly, not elsewhere classified: Secondary | ICD-10-CM | POA: Diagnosis not present

## 2023-06-24 MED ORDER — IOHEXOL 300 MG/ML  SOLN
100.0000 mL | Freq: Once | INTRAMUSCULAR | Status: AC | PRN
  Administered 2023-06-24: 100 mL via INTRAVENOUS

## 2023-06-24 MED ORDER — SODIUM CHLORIDE (PF) 0.9 % IJ SOLN
INTRAMUSCULAR | Status: AC
Start: 2023-06-24 — End: ?
  Filled 2023-06-24: qty 50

## 2023-07-07 ENCOUNTER — Telehealth: Payer: Self-pay | Admitting: *Deleted

## 2023-07-07 NOTE — Telephone Encounter (Signed)
Notified of message below. Verbalized understanding 

## 2023-07-07 NOTE — Telephone Encounter (Signed)
-----   Message from Malachy Mood sent at 07/07/2023  7:12 AM EST ----- Please let pt know his CT scan results, overall stable, no new lesions, will continue current therapy, thanks   Malachy Mood

## 2023-07-15 ENCOUNTER — Inpatient Hospital Stay: Payer: Medicare HMO

## 2023-07-15 ENCOUNTER — Other Ambulatory Visit: Payer: Self-pay

## 2023-07-15 ENCOUNTER — Inpatient Hospital Stay: Payer: Medicare HMO | Attending: Nurse Practitioner

## 2023-07-15 VITALS — BP 161/80 | HR 75 | Temp 98.3°F | Resp 17

## 2023-07-15 DIAGNOSIS — C7A00 Malignant carcinoid tumor of unspecified site: Secondary | ICD-10-CM | POA: Insufficient documentation

## 2023-07-15 DIAGNOSIS — Z7189 Other specified counseling: Secondary | ICD-10-CM

## 2023-07-15 DIAGNOSIS — C7B8 Other secondary neuroendocrine tumors: Secondary | ICD-10-CM

## 2023-07-15 DIAGNOSIS — C7B02 Secondary carcinoid tumors of liver: Secondary | ICD-10-CM | POA: Diagnosis not present

## 2023-07-15 DIAGNOSIS — C61 Malignant neoplasm of prostate: Secondary | ICD-10-CM

## 2023-07-15 LAB — CMP (CANCER CENTER ONLY)
ALT: 10 U/L (ref 0–44)
AST: 20 U/L (ref 15–41)
Albumin: 3.7 g/dL (ref 3.5–5.0)
Alkaline Phosphatase: 77 U/L (ref 38–126)
Anion gap: 4 — ABNORMAL LOW (ref 5–15)
BUN: 10 mg/dL (ref 8–23)
CO2: 32 mmol/L (ref 22–32)
Calcium: 9.1 mg/dL (ref 8.9–10.3)
Chloride: 104 mmol/L (ref 98–111)
Creatinine: 1.12 mg/dL (ref 0.61–1.24)
GFR, Estimated: >60 mL/min (ref >60)
Glucose, Bld: 121 mg/dL — ABNORMAL HIGH (ref 70–99)
Potassium: 4 mmol/L (ref 3.5–5.1)
Sodium: 140 mmol/L (ref 135–145)
Total Bilirubin: 0.4 mg/dL (ref <1.2)
Total Protein: 6.8 g/dL (ref 6.5–8.1)

## 2023-07-15 LAB — CBC WITH DIFFERENTIAL (CANCER CENTER ONLY)
Abs Immature Granulocytes: 0.01 10*3/uL (ref 0.00–0.07)
Basophils Absolute: 0 10*3/uL (ref 0.0–0.1)
Basophils Relative: 1 %
Eosinophils Absolute: 0.4 10*3/uL (ref 0.0–0.5)
Eosinophils Relative: 8 %
HCT: 35.3 % — ABNORMAL LOW (ref 39.0–52.0)
Hemoglobin: 11.6 g/dL — ABNORMAL LOW (ref 13.0–17.0)
Immature Granulocytes: 0 %
Lymphocytes Relative: 28 %
Lymphs Abs: 1.5 10*3/uL (ref 0.7–4.0)
MCH: 30.8 pg (ref 26.0–34.0)
MCHC: 32.9 g/dL (ref 30.0–36.0)
MCV: 93.6 fL (ref 80.0–100.0)
Monocytes Absolute: 0.6 10*3/uL (ref 0.1–1.0)
Monocytes Relative: 11 %
Neutro Abs: 2.7 10*3/uL (ref 1.7–7.7)
Neutrophils Relative %: 52 %
Platelet Count: 216 10*3/uL (ref 150–400)
RBC: 3.77 MIL/uL — ABNORMAL LOW (ref 4.22–5.81)
RDW: 11.6 % (ref 11.5–15.5)
WBC Count: 5.2 10*3/uL (ref 4.0–10.5)
nRBC: 0 % (ref 0.0–0.2)

## 2023-07-15 MED ORDER — LANREOTIDE ACETATE 120 MG/0.5ML ~~LOC~~ SOLN
120.0000 mg | Freq: Once | SUBCUTANEOUS | Status: AC
  Administered 2023-07-15: 120 mg via SUBCUTANEOUS
  Filled 2023-07-15: qty 120

## 2023-08-12 ENCOUNTER — Other Ambulatory Visit: Payer: Self-pay

## 2023-08-12 ENCOUNTER — Inpatient Hospital Stay: Payer: Medicare HMO | Attending: Nurse Practitioner

## 2023-08-12 ENCOUNTER — Inpatient Hospital Stay: Payer: Medicare HMO

## 2023-08-12 VITALS — BP 142/77 | HR 90 | Temp 98.7°F | Resp 17

## 2023-08-12 DIAGNOSIS — C7A00 Malignant carcinoid tumor of unspecified site: Secondary | ICD-10-CM | POA: Diagnosis not present

## 2023-08-12 DIAGNOSIS — C61 Malignant neoplasm of prostate: Secondary | ICD-10-CM

## 2023-08-12 DIAGNOSIS — C7B8 Other secondary neuroendocrine tumors: Secondary | ICD-10-CM

## 2023-08-12 DIAGNOSIS — C7B02 Secondary carcinoid tumors of liver: Secondary | ICD-10-CM | POA: Insufficient documentation

## 2023-08-12 DIAGNOSIS — Z7189 Other specified counseling: Secondary | ICD-10-CM

## 2023-08-12 LAB — CBC WITH DIFFERENTIAL (CANCER CENTER ONLY)
Abs Immature Granulocytes: 0.01 10*3/uL (ref 0.00–0.07)
Basophils Absolute: 0 10*3/uL (ref 0.0–0.1)
Basophils Relative: 0 %
Eosinophils Absolute: 0.3 10*3/uL (ref 0.0–0.5)
Eosinophils Relative: 5 %
HCT: 37.1 % — ABNORMAL LOW (ref 39.0–52.0)
Hemoglobin: 12.3 g/dL — ABNORMAL LOW (ref 13.0–17.0)
Immature Granulocytes: 0 %
Lymphocytes Relative: 24 %
Lymphs Abs: 1.3 10*3/uL (ref 0.7–4.0)
MCH: 30.7 pg (ref 26.0–34.0)
MCHC: 33.2 g/dL (ref 30.0–36.0)
MCV: 92.5 fL (ref 80.0–100.0)
Monocytes Absolute: 0.5 10*3/uL (ref 0.1–1.0)
Monocytes Relative: 9 %
Neutro Abs: 3.4 10*3/uL (ref 1.7–7.7)
Neutrophils Relative %: 62 %
Platelet Count: 229 10*3/uL (ref 150–400)
RBC: 4.01 MIL/uL — ABNORMAL LOW (ref 4.22–5.81)
RDW: 11.3 % — ABNORMAL LOW (ref 11.5–15.5)
WBC Count: 5.6 10*3/uL (ref 4.0–10.5)
nRBC: 0 % (ref 0.0–0.2)

## 2023-08-12 LAB — CMP (CANCER CENTER ONLY)
ALT: 10 U/L (ref 0–44)
AST: 20 U/L (ref 15–41)
Albumin: 3.7 g/dL (ref 3.5–5.0)
Alkaline Phosphatase: 75 U/L (ref 38–126)
Anion gap: 4 — ABNORMAL LOW (ref 5–15)
BUN: 11 mg/dL (ref 8–23)
CO2: 31 mmol/L (ref 22–32)
Calcium: 9.1 mg/dL (ref 8.9–10.3)
Chloride: 103 mmol/L (ref 98–111)
Creatinine: 1.21 mg/dL (ref 0.61–1.24)
GFR, Estimated: >60 mL/min (ref >60)
Glucose, Bld: 116 mg/dL — ABNORMAL HIGH (ref 70–99)
Potassium: 4 mmol/L (ref 3.5–5.1)
Sodium: 138 mmol/L (ref 135–145)
Total Bilirubin: 0.4 mg/dL (ref <1.2)
Total Protein: 6.8 g/dL (ref 6.5–8.1)

## 2023-08-12 MED ORDER — LANREOTIDE ACETATE 120 MG/0.5ML ~~LOC~~ SOLN
120.0000 mg | Freq: Once | SUBCUTANEOUS | Status: AC
  Administered 2023-08-12: 120 mg via SUBCUTANEOUS

## 2023-08-27 DIAGNOSIS — C775 Secondary and unspecified malignant neoplasm of intrapelvic lymph nodes: Secondary | ICD-10-CM | POA: Diagnosis not present

## 2023-08-27 DIAGNOSIS — R3915 Urgency of urination: Secondary | ICD-10-CM | POA: Diagnosis not present

## 2023-08-27 DIAGNOSIS — N62 Hypertrophy of breast: Secondary | ICD-10-CM | POA: Diagnosis not present

## 2023-08-27 DIAGNOSIS — C61 Malignant neoplasm of prostate: Secondary | ICD-10-CM | POA: Diagnosis not present

## 2023-08-27 DIAGNOSIS — C7A019 Malignant carcinoid tumor of the small intestine, unspecified portion: Secondary | ICD-10-CM | POA: Diagnosis not present

## 2023-09-10 ENCOUNTER — Other Ambulatory Visit: Payer: Self-pay

## 2023-09-10 ENCOUNTER — Inpatient Hospital Stay: Payer: Medicare HMO

## 2023-09-10 ENCOUNTER — Inpatient Hospital Stay: Payer: Medicare HMO | Attending: Nurse Practitioner | Admitting: Hematology

## 2023-09-10 ENCOUNTER — Encounter: Payer: Self-pay | Admitting: Hematology

## 2023-09-10 DIAGNOSIS — C61 Malignant neoplasm of prostate: Secondary | ICD-10-CM

## 2023-09-10 DIAGNOSIS — C7A00 Malignant carcinoid tumor of unspecified site: Secondary | ICD-10-CM | POA: Diagnosis present

## 2023-09-10 DIAGNOSIS — C7B02 Secondary carcinoid tumors of liver: Secondary | ICD-10-CM | POA: Insufficient documentation

## 2023-09-10 DIAGNOSIS — Z8546 Personal history of malignant neoplasm of prostate: Secondary | ICD-10-CM | POA: Diagnosis not present

## 2023-09-10 DIAGNOSIS — C7B8 Other secondary neuroendocrine tumors: Secondary | ICD-10-CM

## 2023-09-10 DIAGNOSIS — Z7189 Other specified counseling: Secondary | ICD-10-CM

## 2023-09-10 LAB — CMP (CANCER CENTER ONLY)
ALT: 9 U/L (ref 0–44)
AST: 18 U/L (ref 15–41)
Albumin: 3.6 g/dL (ref 3.5–5.0)
Alkaline Phosphatase: 79 U/L (ref 38–126)
Anion gap: 5 (ref 5–15)
BUN: 11 mg/dL (ref 8–23)
CO2: 30 mmol/L (ref 22–32)
Calcium: 8.9 mg/dL (ref 8.9–10.3)
Chloride: 103 mmol/L (ref 98–111)
Creatinine: 1.13 mg/dL (ref 0.61–1.24)
GFR, Estimated: >60 mL/min (ref >60)
Glucose, Bld: 121 mg/dL — ABNORMAL HIGH (ref 70–99)
Potassium: 4.1 mmol/L (ref 3.5–5.1)
Sodium: 138 mmol/L (ref 135–145)
Total Bilirubin: 0.4 mg/dL (ref 0.0–1.2)
Total Protein: 6.6 g/dL (ref 6.5–8.1)

## 2023-09-10 LAB — CBC WITH DIFFERENTIAL (CANCER CENTER ONLY)
Abs Immature Granulocytes: 0.01 10*3/uL (ref 0.00–0.07)
Basophils Absolute: 0 10*3/uL (ref 0.0–0.1)
Basophils Relative: 1 %
Eosinophils Absolute: 0.3 10*3/uL (ref 0.0–0.5)
Eosinophils Relative: 6 %
HCT: 35.5 % — ABNORMAL LOW (ref 39.0–52.0)
Hemoglobin: 11.8 g/dL — ABNORMAL LOW (ref 13.0–17.0)
Immature Granulocytes: 0 %
Lymphocytes Relative: 28 %
Lymphs Abs: 1.5 10*3/uL (ref 0.7–4.0)
MCH: 30.5 pg (ref 26.0–34.0)
MCHC: 33.2 g/dL (ref 30.0–36.0)
MCV: 91.7 fL (ref 80.0–100.0)
Monocytes Absolute: 0.5 10*3/uL (ref 0.1–1.0)
Monocytes Relative: 9 %
Neutro Abs: 3 10*3/uL (ref 1.7–7.7)
Neutrophils Relative %: 56 %
Platelet Count: 229 10*3/uL (ref 150–400)
RBC: 3.87 MIL/uL — ABNORMAL LOW (ref 4.22–5.81)
RDW: 11.4 % — ABNORMAL LOW (ref 11.5–15.5)
WBC Count: 5.4 10*3/uL (ref 4.0–10.5)
nRBC: 0 % (ref 0.0–0.2)

## 2023-09-10 MED ORDER — LANREOTIDE ACETATE 120 MG/0.5ML ~~LOC~~ SOLN
120.0000 mg | Freq: Once | SUBCUTANEOUS | Status: AC
  Administered 2023-09-10: 120 mg via SUBCUTANEOUS
  Filled 2023-09-10: qty 120

## 2023-09-10 NOTE — Assessment & Plan Note (Signed)
Most consistent with GI primary with liver and nodal metastasis -Due to rising PSA, CT/bone scan were obtained showing a mesenteric mass and single liver lesion. DOTATATE PET on 04/09/20 showed intense uptake in the central mesenteric mass with 3 liver metastases and small peritoneal/nodal metastatic deposits along the left iliac vessels. There is no primary bowel lesion identified.   -His 04/30/20 liver biopsy showed metastatic well differentiated/G1 neuroendocrine tumor to the liver. --He began first line Sandostatin 20 mg on 05/10/20. However on 05/12/20 he had pericarditis and treatment was held. I changed him to Lanreotide 120mg  every 4 weeks starting 10/18/20  -he is clinically stable, tolerating treatment well, last dotatate PET scan in December 2023 showed stable primary tumor in small bowel, stable metastatic lesions in peritoneum and liver

## 2023-09-10 NOTE — Progress Notes (Signed)
 [facility]   [phone] [phone]   Clinic Follow up Note   Patient Care Team: Merilee, L.Addie, MD (Inactive) as PCP - General (Family Medicine) Verlin Lonni BIRCH, MD as PCP - Cardiology (Cardiology) Lenon Channing CROME, RN (Inactive) as Oncology Nurse Navigator Lanny Callander, MD as Consulting Physician (Oncology) Burton, [name] K, NP as Nurse Practitioner (Nurse Practitioner) Alvaro Ricardo KATHEE Raddle., MD as Consulting Physician (Urology) Patrcia Cough, MD as Consulting Physician (Radiation Oncology) Imaging, [facility] as Consulting Physician (Diagnostic Radiology)  Date of Service:  09/10/2023  CHIEF COMPLAINT: f/u of neuroendocrine tumor  CURRENT THERAPY:  Lanreotide injection every months  Oncology History   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) Most consistent with GI primary with liver and nodal metastasis -Due to rising PSA, CT/bone scan were obtained showing a mesenteric mass and single liver lesion. DOTATATE PET on 04/09/20 showed intense uptake in the central mesenteric mass with 3 liver metastases and small peritoneal/nodal metastatic deposits along the left iliac vessels. There is no primary bowel lesion identified.   -His 04/30/20 liver biopsy showed metastatic well differentiated/G1 neuroendocrine tumor to the liver. --He began first line Sandostatin  20 mg on 05/10/20. However on 05/12/20 he had pericarditis and treatment was held. I changed him to Lanreotide 120mg  every 4 weeks starting 10/18/20  -he is clinically stable, tolerating treatment well, last dotatate PET scan in December 2023 showed stable primary tumor in small bowel, stable metastatic lesions in peritoneum and liver    Assessment and Plan    Metastatic Neuroendocrine Tumor Follow-up for metastatic neuroendocrine tumor. Reports well-managed bowel movements with Miralax  and occasional abdominal spasms controlled with dicyclomine .  Recent CT scan shows stable liver lesions with no significant changes compared to previous PET scan. Blood tests including kidney and liver function are normal. Discussed that the tumor type is slow-growing and many patients remain on injections for several years. Emphasized the importance of monitoring for new symptoms. - Continue monthly injections - Monitor for new symptoms - Schedule follow-up appointment in three months - Copy note to Dr. Patrcia  Prostate Cancer Prostate cancer diagnosed in 2017, currently managed with Erleada and tamsulosin . Recent PSA levels are barely detectable. Transitioning to six-month follow-ups with urologist. - Continue Ulice daily - Continue tamsulosin  nightly - Follow-up with urologist in six months  General Health Maintenance Advised to maintain indoor exercise routine during winter to manage bowel movements and overall health. - Encourage use of exercise bike and steppers for indoor exercise  Follow-up - Administer injection at 2:15 PM - Schedule future appointments for monthly injections and three-month follow-up.         SUMMARY OF ONCOLOGIC HISTORY: Oncology History Overview Note  Cancer Staging No matching staging information was found for the patient.    Malignant neoplasm of prostate (HCC)  08/31/2017 Initial Diagnosis   Malignant neoplasm of prostate (HCC)   10/28/2017 Genetic Testing   The patient had genetic testing due to a personal history of prostate cancer and a family history of breast and stomach cancer.  The Common Hereditary Cancers Panel + Prostate Cancer Panel was ordered.  APC, ATM, AXIN2, BARD1, BMPR1A, BRCA1, BRCA2, BRIP1, CDH1, CDK4, CDKN2A (p14ARF), CDKN2A (p16INK4a), CHEK2, CTNNA1, DICER1, EPCAM*, FANCA, GREM1*, KIT, MEN1, MLH1, MSH2, MSH3, MSH6, MUTYH, NBN, NF1, PALB2, PDGFRA, PMS2, POLD1, POLE, PTEN, RAD50, RAD51C, RAD51D, SDHB, SDHC, SDHD, SMAD4, SMARCA4, STK11, TP53, TSC1, TSC2, VHL. The following genes were  evaluated for sequence changes only: HOXB13*, NTHL1*, SDHA.  Results: Negative, no pathogenic variants identified. The date of this test report is 10/28/2017.    01/05/2018 - 02/12/2018 Radiation Therapy   S/p radiation to the prostate 70 Gy in 28 fractions from 01/05/2018-02/12/2018 per Dr. Patrcia   01/31/2021 Imaging   CT A/P  IMPRESSION: 1. Interval decrease in size of hypodense lesions of the inferior right lobe of the liver and posterior liver dome findings are consistent with treatment response of metastatic lesions. 2. No evidence of new metastatic disease in the abdomen or pelvis. 3. Prostatomegaly.   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  Upper normal 9 mm short axis lymph node in the adjacent mesentery 2.  1.6 x 1.4 cm heterogeneous, poorly defined lesion in the inferior right liver suspicious for metastatic disease, the differential includes GI primary or metastatic prostate cancer No retroperitoneal or pelvic sidewall lymphadenopathy   04/01/2020 PET scan   IMPRESSION: 1. Central mesenteric mass with intense radiotracer activity consistent well differentiated neuroendocrine tumor 2. 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 3. Small peritoneal/nodal metastasis along the LEFT iliac vessels. 4. Very small lesion in the pancreas with intermediate activity could represent primary lesion. No primary bowel lesion identified. 5. Small metastatic implant within the pericardium adjacent LEFT heart ventricle. 6. No skeletal metastasis.   04/30/2020 Pathology Results   FINAL MICROSCOPIC DIAGNOSIS:  A. LIVER, BIOPSY:  - Metastatic well-differentiated (G1) neuroendocrine tumor to the  liver COMMENT:  Immunohistochemical stains show that the tumor cells are positive for  synaptophysin, chromogranin, CD56 and CDX2.  Tumor cells are negative  for TTF-1.  The findings are consistent with neuroendocrine tumor of  gastrointestinal origin.  Ki-67 stain shows a proliferative index of  about 1%, consistent with well-differentiated, grade 1 tumor.    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 Held after cycle 1 due to suspected pericarditis and deconditioning.           --Changed to monthly Lanreotide on 10/18/20.     01/31/2021 Imaging   CT A/P  IMPRESSION: 1. Interval decrease in size of hypodense lesions of the inferior right lobe of the liver and posterior liver dome findings are consistent with treatment response of metastatic lesions. 2. No evidence of new metastatic disease in the abdomen or pelvis. 3. Prostatomegaly.   07/29/2021 Imaging   EXAM: CT CHEST, ABDOMEN, AND PELVIS WITH CONTRAST  IMPRESSION: 1. Slight interval increase in size of the 2 liver metastases. No new liver lesions evident. 2. Stable mild lymphadenopathy in the central small bowel mesentery. 3. 3 mm right upper lobe pulmonary nodule. Attention on follow-up recommended. 4. 4 cm ascending thoracic aortic aneurysm, increased from 3.5 cm on 05/12/2020. Recommend annual imaging followup by CTA or MRA. This recommendation follows 2010 ACCF/AHA/AATS/ACR/ASA/SCA/SCAI/SIR/STS/SVM Guidelines for the Diagnosis and Management of Patients with Thoracic Aortic Disease. Circulation. 2010; 121: Z733-z630. Aortic aneurysm NOS (ICD10-I71.9)  5. Aortic Atherosclerosis (ICD10-I70.0).      Discussed the use of AI scribe software for clinical note transcription with the patient, who gave verbal consent to proceed.  History of Present Illness   The patient, a 78 year old  gentleman with a history of metastatic neuroendocrine  tumor and prostate cancer, presents for a routine follow-up. He reports no new symptoms since the last visit. He continues to manage constipation with daily Miralax , which he finds sufficient. He occasionally experiences abdominal spasms, which have not occurred for a couple of months and are managed with dicyclomine  as needed. The patient expresses concern about maintaining regular bowel movements during the winter months when he is less active due to cold weather. He has indoor exercise equipment, including an exercise bike and steppers, which he plans to use. He also has a history of prostate cancer diagnosed in 2017, which is currently being managed with Erleada. He also takes tamsulosin  and mirtazapine , the latter for sleep and nightmares.         All other systems were reviewed with the patient and are negative.  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 [facility])    Nocturia    Pericardial effusion    Pericarditis    Prostate cancer [facility]) urologist-- dr alvaro /  oncologist-- dr manning   prostate bx 06-24-2016 and 09-15-2017 at dr alvaro office  Stage T1c, Gleason 4+3, PSA 8.48, vol 33cc---  planned external beam radiation therpay   Tobacco abuse    Urgency of urination    Wears glasses     SURGICAL HISTORY: Past Surgical History:  Procedure Laterality Date   COLONOSCOPY  last one 06/ 2018   GOLD SEED IMPLANT N/A 12/23/2017   Procedure: GOLD SEED IMPLANT;  Surgeon: Alvaro Hummer, MD;  Location: [facility];  Service: Urology;  Laterality: N/A;   LEFT HEART CATH AND CORONARY ANGIOGRAPHY N/A 05/12/2020   Procedure: LEFT HEART CATH AND CORONARY ANGIOGRAPHY;  Surgeon: Verlin Lonni BIRCH, MD;  Location: [facility] INVAS[facility];  Service: Cardiovascular;  Laterality: N/A;   PROSTATE BIOPSY  06-24-2016;  09-15-2017-- at dr alvaro office   SPACE OAR INSTILLATION N/A 12/23/2017   Procedure: SPACE OAR INSTILLATION;  Surgeon: Alvaro Hummer, MD;  Location: [facility];  Service: Urology;  Laterality: N/A;    I have reviewed the social history and family history with the patient and they are unchanged from previous note.  ALLERGIES:  is allergic to aspirin , primidone, and simvastatin.  MEDICATIONS:  Current Outpatient Medications  Medication Sig Dispense Refill   apalutamide (ERLEADA) 240 MG tablet Take 240 mg by mouth daily.     mirtazapine  (REMERON  SOL-TAB) 15 MG disintegrating tablet Take 15 mg by mouth at bedtime.     tamsulosin  (FLOMAX ) 0.4 MG CAPS capsule Take 0.4 mg by mouth.     acetaminophen  (TYLENOL ) 500 MG tablet Take 1,000 mg by mouth every 6 (six) hours as needed for headache (pain).      clopidogrel  (PLAVIX ) 75 MG tablet Take 75 mg by mouth daily.      dicyclomine  (BENTYL ) 10 MG capsule Take 1 capsule (10 mg total) by mouth every 8 (eight) hours as needed for spasms. 30 capsule 1   fluticasone  (FLONASE ) 50 MCG/ACT nasal spray Place 2 sprays into both  nostrils daily. 16 g 0   loratadine  (CLARITIN ) 10 MG tablet Take 10 mg by mouth every morning.  (Patient not taking: Reported on 12/02/2022)     OVER THE COUNTER MEDICATION Place 1 drop into both eyes daily as needed (dry eyes). Over the counter lubricating eye drop      pantoprazole  (PROTONIX ) 40 MG tablet Take 1 tablet (40 mg total) by mouth daily. 30 tablet 0   polyethylene glycol (MIRALAX  / GLYCOLAX ) 17 g packet Take 17 g by mouth 2 (two) times daily. 14 each 0   SYMBICORT 160-4.5 MCG/ACT inhaler Inhale 1-2 puffs into the  lungs as needed.     traMADol  (ULTRAM ) 50 MG tablet Take 1-2 tablets (50-100 mg total) by mouth every 6 (six) hours as needed for moderate pain or severe pain. 15 tablet 0   No current facility-administered medications for this visit.    PHYSICAL EXAMINATION: ECOG PERFORMANCE STATUS: 0 - Asymptomatic  Vitals:   09/10/23 1329  BP: (!) 158/83  Pulse: 88  Resp: 15  Temp: (!) 97.3 F (36.3 C)  SpO2: 99%   Wt Readings from Last 3 Encounters:  09/10/23 161 lb 14.4 oz (73.4 kg)  06/16/23 164 lb 1.6 oz (74.4 kg)  03/26/23 164 lb (74.4 kg)     GENERAL:alert, no distress and comfortable SKIN: skin color, texture, turgor are normal, no rashes or significant lesions EYES: normal, Conjunctiva are pink and non-injected, sclera clear NECK: supple, thyroid  normal size, non-tender, without nodularity LYMPH:  no palpable lymphadenopathy in the cervical, axillary  LUNGS: clear to auscultation and percussion with normal breathing effort HEART: regular rate & rhythm and no murmurs and no lower extremity edema ABDOMEN:abdomen soft, non-tender and normal bowel sounds Musculoskeletal:no cyanosis of digits and no clubbing  NEURO: alert & oriented x 3 with fluent speech, no focal motor/sensory deficits  Physical Exam          LABORATORY DATA:  I have reviewed the data as listed    Latest Ref Rng & Units 09/10/2023   12:59 PM 08/12/2023   12:57 PM 07/15/2023    1:08 PM  CBC  WBC 4.0 - 10.5 K/uL 5.4  5.6  5.2   Hemoglobin 13.0 - 17.0 g/dL 88.1  87.6  88.3   Hematocrit 39.0 - 52.0 % 35.5  37.1  35.3   Platelets 150 - 400 K/uL 229  229  216         Latest Ref Rng & Units 09/10/2023   12:59 PM 08/12/2023   12:57 PM 07/15/2023    1:08 PM  CMP  Glucose 70 - 99 mg/dL 878  883  878   BUN 8 - 23 mg/dL 11  11  10    Creatinine 0.61 - 1.24 mg/dL 8.86  8.78  8.87   Sodium 135 - 145 mmol/L 138  138  140   Potassium 3.5 - 5.1 mmol/L 4.1  4.0  4.0   Chloride 98 - 111 mmol/L 103  103  104   CO2 22  - 32 mmol/L 30  31  32   Calcium 8.9 - 10.3 mg/dL 8.9  9.1  9.1   Total Protein 6.5 - 8.1 g/dL 6.6  6.8  6.8   Total Bilirubin 0.0 - 1.2 mg/dL 0.4  0.4  0.4   Alkaline Phos 38 - 126 U/L 79  75  77   AST 15 - 41 U/L 18  20  20    ALT 0 - 44 U/L 9  10  10       RADIOGRAPHIC STUDIES: I have personally reviewed the radiological images as listed and agreed with the findings in the report. No results found.    No orders of the defined types were placed in this encounter.  All questions were answered. The patient knows to call the clinic with any problems, questions or concerns. No barriers to learning was detected. The total time spent in the appointment was 25 minutes.     Onita Mattock, MD 09/10/2023

## 2023-10-08 ENCOUNTER — Inpatient Hospital Stay: Payer: Medicare HMO

## 2023-10-08 ENCOUNTER — Ambulatory Visit: Payer: Medicare HMO

## 2023-10-08 ENCOUNTER — Inpatient Hospital Stay: Payer: Medicare HMO | Attending: Nurse Practitioner

## 2023-10-08 ENCOUNTER — Encounter: Payer: Self-pay | Admitting: Hematology

## 2023-10-08 VITALS — BP 165/73 | HR 87 | Temp 99.0°F | Resp 16

## 2023-10-08 DIAGNOSIS — C7B8 Other secondary neuroendocrine tumors: Secondary | ICD-10-CM

## 2023-10-08 DIAGNOSIS — Z7189 Other specified counseling: Secondary | ICD-10-CM

## 2023-10-08 DIAGNOSIS — C7A00 Malignant carcinoid tumor of unspecified site: Secondary | ICD-10-CM | POA: Diagnosis present

## 2023-10-08 DIAGNOSIS — C61 Malignant neoplasm of prostate: Secondary | ICD-10-CM

## 2023-10-08 DIAGNOSIS — C7B02 Secondary carcinoid tumors of liver: Secondary | ICD-10-CM | POA: Diagnosis present

## 2023-10-08 LAB — CBC WITH DIFFERENTIAL (CANCER CENTER ONLY)
Abs Immature Granulocytes: 0.02 10*3/uL (ref 0.00–0.07)
Basophils Absolute: 0 10*3/uL (ref 0.0–0.1)
Basophils Relative: 1 %
Eosinophils Absolute: 0.4 10*3/uL (ref 0.0–0.5)
Eosinophils Relative: 7 %
HCT: 36.1 % — ABNORMAL LOW (ref 39.0–52.0)
Hemoglobin: 11.9 g/dL — ABNORMAL LOW (ref 13.0–17.0)
Immature Granulocytes: 0 %
Lymphocytes Relative: 27 %
Lymphs Abs: 1.5 10*3/uL (ref 0.7–4.0)
MCH: 30.4 pg (ref 26.0–34.0)
MCHC: 33 g/dL (ref 30.0–36.0)
MCV: 92.1 fL (ref 80.0–100.0)
Monocytes Absolute: 0.4 10*3/uL (ref 0.1–1.0)
Monocytes Relative: 7 %
Neutro Abs: 3.1 10*3/uL (ref 1.7–7.7)
Neutrophils Relative %: 58 %
Platelet Count: 226 10*3/uL (ref 150–400)
RBC: 3.92 MIL/uL — ABNORMAL LOW (ref 4.22–5.81)
RDW: 11.7 % (ref 11.5–15.5)
WBC Count: 5.4 10*3/uL (ref 4.0–10.5)
nRBC: 0 % (ref 0.0–0.2)

## 2023-10-08 LAB — CMP (CANCER CENTER ONLY)
ALT: 8 U/L (ref 0–44)
AST: 17 U/L (ref 15–41)
Albumin: 3.7 g/dL (ref 3.5–5.0)
Alkaline Phosphatase: 70 U/L (ref 38–126)
Anion gap: 4 — ABNORMAL LOW (ref 5–15)
BUN: 10 mg/dL (ref 8–23)
CO2: 30 mmol/L (ref 22–32)
Calcium: 8.8 mg/dL — ABNORMAL LOW (ref 8.9–10.3)
Chloride: 104 mmol/L (ref 98–111)
Creatinine: 1.11 mg/dL (ref 0.61–1.24)
GFR, Estimated: >60 mL/min (ref >60)
Glucose, Bld: 146 mg/dL — ABNORMAL HIGH (ref 70–99)
Potassium: 4.1 mmol/L (ref 3.5–5.1)
Sodium: 138 mmol/L (ref 135–145)
Total Bilirubin: 0.4 mg/dL (ref 0.0–1.2)
Total Protein: 6.6 g/dL (ref 6.5–8.1)

## 2023-10-08 MED ORDER — LANREOTIDE ACETATE 120 MG/0.5ML ~~LOC~~ SOLN
120.0000 mg | Freq: Once | SUBCUTANEOUS | Status: AC
  Administered 2023-10-08: 120 mg via SUBCUTANEOUS
  Filled 2023-10-08: qty 120

## 2023-11-04 ENCOUNTER — Other Ambulatory Visit: Payer: Self-pay

## 2023-11-04 DIAGNOSIS — C7B8 Other secondary neuroendocrine tumors: Secondary | ICD-10-CM

## 2023-11-04 DIAGNOSIS — C61 Malignant neoplasm of prostate: Secondary | ICD-10-CM

## 2023-11-05 ENCOUNTER — Inpatient Hospital Stay: Payer: Medicare HMO | Attending: Nurse Practitioner

## 2023-11-05 ENCOUNTER — Inpatient Hospital Stay: Payer: Medicare HMO

## 2023-11-05 VITALS — BP 165/77 | HR 72 | Temp 98.6°F | Resp 16

## 2023-11-05 DIAGNOSIS — C7A00 Malignant carcinoid tumor of unspecified site: Secondary | ICD-10-CM | POA: Insufficient documentation

## 2023-11-05 DIAGNOSIS — C7B8 Other secondary neuroendocrine tumors: Secondary | ICD-10-CM

## 2023-11-05 DIAGNOSIS — Z7189 Other specified counseling: Secondary | ICD-10-CM

## 2023-11-05 DIAGNOSIS — C7B02 Secondary carcinoid tumors of liver: Secondary | ICD-10-CM | POA: Insufficient documentation

## 2023-11-05 DIAGNOSIS — C61 Malignant neoplasm of prostate: Secondary | ICD-10-CM

## 2023-11-05 LAB — CMP (CANCER CENTER ONLY)
ALT: 10 U/L (ref 0–44)
AST: 18 U/L (ref 15–41)
Albumin: 3.7 g/dL (ref 3.5–5.0)
Alkaline Phosphatase: 73 U/L (ref 38–126)
Anion gap: 5 (ref 5–15)
BUN: 12 mg/dL (ref 8–23)
CO2: 30 mmol/L (ref 22–32)
Calcium: 8.5 mg/dL — ABNORMAL LOW (ref 8.9–10.3)
Chloride: 104 mmol/L (ref 98–111)
Creatinine: 1.09 mg/dL (ref 0.61–1.24)
GFR, Estimated: >60 mL/min (ref >60)
Glucose, Bld: 93 mg/dL (ref 70–99)
Potassium: 4.2 mmol/L (ref 3.5–5.1)
Sodium: 139 mmol/L (ref 135–145)
Total Bilirubin: 0.4 mg/dL (ref 0.0–1.2)
Total Protein: 6.4 g/dL — ABNORMAL LOW (ref 6.5–8.1)

## 2023-11-05 LAB — CBC WITH DIFFERENTIAL (CANCER CENTER ONLY)
Abs Immature Granulocytes: 0.02 10*3/uL (ref 0.00–0.07)
Basophils Absolute: 0 10*3/uL (ref 0.0–0.1)
Basophils Relative: 1 %
Eosinophils Absolute: 0.6 10*3/uL — ABNORMAL HIGH (ref 0.0–0.5)
Eosinophils Relative: 8 %
HCT: 35.2 % — ABNORMAL LOW (ref 39.0–52.0)
Hemoglobin: 11.7 g/dL — ABNORMAL LOW (ref 13.0–17.0)
Immature Granulocytes: 0 %
Lymphocytes Relative: 29 %
Lymphs Abs: 2.2 10*3/uL (ref 0.7–4.0)
MCH: 31.4 pg (ref 26.0–34.0)
MCHC: 33.2 g/dL (ref 30.0–36.0)
MCV: 94.4 fL (ref 80.0–100.0)
Monocytes Absolute: 0.8 10*3/uL (ref 0.1–1.0)
Monocytes Relative: 10 %
Neutro Abs: 4 10*3/uL (ref 1.7–7.7)
Neutrophils Relative %: 52 %
Platelet Count: 238 10*3/uL (ref 150–400)
RBC: 3.73 MIL/uL — ABNORMAL LOW (ref 4.22–5.81)
RDW: 11.6 % (ref 11.5–15.5)
WBC Count: 7.6 10*3/uL (ref 4.0–10.5)
nRBC: 0 % (ref 0.0–0.2)

## 2023-11-05 MED ORDER — LANREOTIDE ACETATE 120 MG/0.5ML ~~LOC~~ SOLN
120.0000 mg | Freq: Once | SUBCUTANEOUS | Status: AC
Start: 2023-11-05 — End: 2023-11-05
  Administered 2023-11-05: 120 mg via SUBCUTANEOUS
  Filled 2023-11-05: qty 120

## 2023-12-03 ENCOUNTER — Ambulatory Visit: Payer: Medicare HMO | Admitting: Nurse Practitioner

## 2023-12-03 ENCOUNTER — Inpatient Hospital Stay: Payer: Medicare HMO | Admitting: Nurse Practitioner

## 2023-12-03 ENCOUNTER — Inpatient Hospital Stay: Payer: Medicare HMO

## 2023-12-03 ENCOUNTER — Ambulatory Visit: Payer: Medicare HMO

## 2023-12-03 ENCOUNTER — Inpatient Hospital Stay: Payer: Medicare HMO | Attending: Nurse Practitioner

## 2023-12-03 ENCOUNTER — Other Ambulatory Visit: Payer: Self-pay | Admitting: Nurse Practitioner

## 2023-12-03 ENCOUNTER — Other Ambulatory Visit: Payer: Medicare HMO

## 2023-12-03 ENCOUNTER — Encounter: Payer: Self-pay | Admitting: Nurse Practitioner

## 2023-12-03 VITALS — BP 138/88 | HR 96 | Temp 97.7°F | Resp 24 | Wt 154.5 lb

## 2023-12-03 DIAGNOSIS — C7B8 Other secondary neuroendocrine tumors: Secondary | ICD-10-CM

## 2023-12-03 DIAGNOSIS — Z8546 Personal history of malignant neoplasm of prostate: Secondary | ICD-10-CM | POA: Diagnosis not present

## 2023-12-03 DIAGNOSIS — Z923 Personal history of irradiation: Secondary | ICD-10-CM | POA: Diagnosis not present

## 2023-12-03 DIAGNOSIS — K5909 Other constipation: Secondary | ICD-10-CM | POA: Diagnosis not present

## 2023-12-03 DIAGNOSIS — C7A00 Malignant carcinoid tumor of unspecified site: Secondary | ICD-10-CM | POA: Insufficient documentation

## 2023-12-03 DIAGNOSIS — C61 Malignant neoplasm of prostate: Secondary | ICD-10-CM | POA: Insufficient documentation

## 2023-12-03 DIAGNOSIS — Z79899 Other long term (current) drug therapy: Secondary | ICD-10-CM | POA: Diagnosis not present

## 2023-12-03 DIAGNOSIS — C7B02 Secondary carcinoid tumors of liver: Secondary | ICD-10-CM | POA: Diagnosis present

## 2023-12-03 DIAGNOSIS — R110 Nausea: Secondary | ICD-10-CM | POA: Insufficient documentation

## 2023-12-03 DIAGNOSIS — Z7189 Other specified counseling: Secondary | ICD-10-CM

## 2023-12-03 LAB — CBC WITH DIFFERENTIAL (CANCER CENTER ONLY)
Abs Immature Granulocytes: 0.03 10*3/uL (ref 0.00–0.07)
Basophils Absolute: 0 10*3/uL (ref 0.0–0.1)
Basophils Relative: 0 %
Eosinophils Absolute: 0.1 10*3/uL (ref 0.0–0.5)
Eosinophils Relative: 1 %
HCT: 38.3 % — ABNORMAL LOW (ref 39.0–52.0)
Hemoglobin: 12.3 g/dL — ABNORMAL LOW (ref 13.0–17.0)
Immature Granulocytes: 0 %
Lymphocytes Relative: 6 %
Lymphs Abs: 0.7 10*3/uL (ref 0.7–4.0)
MCH: 30.4 pg (ref 26.0–34.0)
MCHC: 32.1 g/dL (ref 30.0–36.0)
MCV: 94.6 fL (ref 80.0–100.0)
Monocytes Absolute: 0.6 10*3/uL (ref 0.1–1.0)
Monocytes Relative: 6 %
Neutro Abs: 9.5 10*3/uL — ABNORMAL HIGH (ref 1.7–7.7)
Neutrophils Relative %: 87 %
Platelet Count: 247 10*3/uL (ref 150–400)
RBC: 4.05 MIL/uL — ABNORMAL LOW (ref 4.22–5.81)
RDW: 11.1 % — ABNORMAL LOW (ref 11.5–15.5)
WBC Count: 10.9 10*3/uL — ABNORMAL HIGH (ref 4.0–10.5)
nRBC: 0 % (ref 0.0–0.2)

## 2023-12-03 LAB — CMP (CANCER CENTER ONLY)
ALT: 15 U/L (ref 0–44)
AST: 19 U/L (ref 15–41)
Albumin: 4 g/dL (ref 3.5–5.0)
Alkaline Phosphatase: 78 U/L (ref 38–126)
Anion gap: 6 (ref 5–15)
BUN: 14 mg/dL (ref 8–23)
CO2: 30 mmol/L (ref 22–32)
Calcium: 9.2 mg/dL (ref 8.9–10.3)
Chloride: 101 mmol/L (ref 98–111)
Creatinine: 1.02 mg/dL (ref 0.61–1.24)
GFR, Estimated: >60 mL/min (ref >60)
Glucose, Bld: 120 mg/dL — ABNORMAL HIGH (ref 70–99)
Potassium: 4 mmol/L (ref 3.5–5.1)
Sodium: 137 mmol/L (ref 135–145)
Total Bilirubin: 0.5 mg/dL (ref 0.0–1.2)
Total Protein: 7.2 g/dL (ref 6.5–8.1)

## 2023-12-03 MED ORDER — ONDANSETRON HCL 4 MG PO TABS: 4.0000 mg | ORAL_TABLET | Freq: Three times a day (TID) | ORAL | 1 refills | Status: AC | PRN

## 2023-12-03 MED ORDER — LANREOTIDE ACETATE 120 MG/0.5ML ~~LOC~~ SOLN
120.0000 mg | Freq: Once | SUBCUTANEOUS | Status: AC
  Administered 2023-12-03: 120 mg via SUBCUTANEOUS
  Filled 2023-12-03: qty 120

## 2023-12-03 NOTE — Assessment & Plan Note (Addendum)
 Most consistent with GI primary with liver and nodal metastasis -Due to rising PSA, CT/bone scan were obtained showing a mesenteric mass and single liver lesion. DOTATATE PET on 04/09/20 showed intense uptake in the central mesenteric mass with 3 liver metastases and small peritoneal/nodal metastatic deposits along the left iliac vessels. There is no primary bowel lesion identified.   -His 04/30/20 liver biopsy showed metastatic well differentiated/G1 neuroendocrine tumor to the liver. --He began first line Sandostatin 20 mg on 05/10/20. However on 05/12/20 he had pericarditis and treatment was held. he was changed to Lanreotide 120mg  every 4 weeks starting 10/18/20  -he is clinically stable, tolerating treatment well, last dotatate PET scan in December 2023 showed stable primary tumor in small bowel, stable metastatic lesions in peritoneum and liver  -CT CAP from 06/24/2023 showed overall stable disease. -New CT CAP ordered today, 12/03/2023, to be done in next 3 to 4 weeks. -Continue monthly lanreotide injections and follow-up every 3 months.

## 2023-12-03 NOTE — Progress Notes (Signed)
 Patient Care Team: Clovis Riley, L.August Saucer, MD (Inactive) as PCP - General (Family Medicine) Kathleene Hazel, MD as PCP - Cardiology (Cardiology) Radonna Ricker, RN (Inactive) as Oncology Nurse Navigator Malachy Mood, MD as Consulting Physician (Oncology) Pollyann Samples, NP as Nurse Practitioner (Nurse Practitioner) Berneice Heinrich Delbert Phenix., MD as Consulting Physician (Urology) Margaretmary Dys, MD as Consulting Physician (Radiation Oncology) Imaging, [facility] as Consulting Physician (Diagnostic Radiology)  Clinic Day:  12/03/2023  Referring physician: Malachy Mood, MD  ASSESSMENT & PLAN:   Assessment & Plan: 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) Most consistent with GI primary with liver and nodal metastasis -Due to rising PSA, CT/bone scan were obtained showing a mesenteric mass and single liver lesion. DOTATATE PET on 04/09/20 showed intense uptake in the central mesenteric mass with 3 liver metastases and small peritoneal/nodal metastatic deposits along the left iliac vessels. There is no primary bowel lesion identified.   -His 04/30/20 liver biopsy showed metastatic well differentiated/G1 neuroendocrine tumor to the liver. --He began first line Sandostatin 20 mg on 05/10/20. However on 05/12/20 he had pericarditis and treatment was held. he was changed to Lanreotide 120mg  every 4 weeks starting 10/18/20  -he is clinically stable, tolerating treatment well, last dotatate PET scan in December 2023 showed stable primary tumor in small bowel, stable metastatic lesions in peritoneum and liver  -CT CAP from 06/24/2023 showed overall stable disease. -New CT CAP ordered today, 12/03/2023, to be done in next 3 to 4 weeks. -Continue monthly lanreotide injections and follow-up every 3 months.   Constipation Patient reports chronic constipation.  Will generally take MiraLAX for 3 or 4 days in a row and then have to back off use because he develops loose stools.   States it has been 2 or 3 days since most recent bowel movement.  He did develop some nausea this morning.  Possibly related to constipation.  Recommend he do try to take MiraLAX daily.  If loose stools to develop.  Recommend he cut back the dosing to half of the dose, rather than cutting it out completely.  He voiced agreement with this plan.  Nausea Patient states he started having nausea this morning around 5 AM.  Has had several episodes since being here today.  Has not thrown up, but feels like throwing up might make him feel better.  Possible that significant constipation, causing his nausea.  Did prescribe Zofran 4 mg which may be taken up to 3 times daily as needed for nausea.  Plan Reviewed labs -Mild elevation of WBC and ANC.  CMP is within normal limits. -CEA level pending --Patient reports getting over a cold which he has had for the past week.  Likely cause of mild bump in WBC.  Continue to monitor blood count every 3 months. Will get CT CAP in the next 3 to 4 weeks. Proceed with lanreotide injection today. Continue monthly lanreotide injections. Repeat labs, follow-up, and lanreotide injection in 3 months.  The patient understands the plans discussed today and is in agreement with them.  He knows to contact our office if he develops concerns prior to his next appointment.  I provided 25 minutes of face-to-face time during this encounter and > 50% was spent counseling as documented under my assessment and plan.    Carlean Jews, NP  [facility] - A DEPT OF [facility] [address] [place] [address] Dept:  [phone] Dept Fax: [phone]   Orders Placed This Encounter  Procedures   CT CHEST ABDOMEN PELVIS W CONTRAST    Standing Status:   Future    Expected Date:   12/17/2023    Expiration Date:   12/02/2024    If indicated for the ordered procedure, I authorize the administration of contrast media  per Radiology protocol:   Yes    Does the patient have a contrast media/X-ray dye allergy?:   No    Preferred imaging location?:   [facility]    If indicated for the ordered procedure, I authorize the administration of oral contrast media per Radiology protocol:   Yes      CHIEF COMPLAINT:  CC: Metastatic malignant neuroendocrine tumor  Current Treatment: Lanreotide injection monthly  INTERVAL HISTORY:  Ryan Wilkerson is here today for repeat clinical assessment.  He was last seen by Dr. Mosetta Putt on 09/10/2023.  Continues with lanreotide injections monthly with labs and follow-up every 3 months.  Today, he reports nausea and moderate constipation.  Will generally use MiraLAX daily.  Will have to stop using MiraLAX periodically due to development of loose stools.  We discussed lowering the dose of MiraLAX rather than cutting out completely as he does have pretty significant history of constipation which does cause symptoms.  He does report that his symptoms do improve, and nearly resolved, for about a week after getting lanreotide injection.  After that, symptoms gradually get worse until it is time for next injection.  He is due for new CT CAP.  Last one, showing stable disease, was October 2024. He  denies chest pain, chest pressure, or shortness of breath. He denies headaches or visual disturbances.  He denies fevers or chills. He denies pain. His appetite is fair. His weight has decreased 6 pounds over last 3 pounds .  I have reviewed the past medical history, past surgical history, social history and family history with the patient and they are unchanged from previous note.  ALLERGIES:  is allergic to aspirin, primidone, and simvastatin.  MEDICATIONS:  Current Outpatient Medications  Medication Sig Dispense Refill   acetaminophen (TYLENOL) 500 MG tablet Take 1,000 mg by mouth every 6 (six) hours as needed for headache (pain).      apalutamide (ERLEADA) 240 MG tablet Take 240 mg by mouth  daily.     clopidogrel (PLAVIX) 75 MG tablet Take 75 mg by mouth daily.      dicyclomine (BENTYL) 10 MG capsule Take 1 capsule (10 mg total) by mouth every 8 (eight) hours as needed for spasms. 30 capsule 1   fluticasone (FLONASE) 50 MCG/ACT nasal spray Place 2 sprays into both nostrils daily. 16 g 0   loratadine (CLARITIN) 10 MG tablet Take 10 mg by mouth every morning.     mirtazapine (REMERON SOL-TAB) 15 MG disintegrating tablet Take 15 mg by mouth at bedtime.     ondansetron (ZOFRAN) 4 MG tablet Take 1 tablet (4 mg total) by mouth every 8 (eight) hours as needed for nausea or vomiting. 30 tablet 1   OVER THE COUNTER MEDICATION Place 1 drop into both eyes daily as needed (dry eyes). Over the counter lubricating eye drop      pantoprazole (PROTONIX) 40 MG tablet Take 1 tablet (40 mg total) by mouth daily. 30 tablet 0   polyethylene glycol (MIRALAX / GLYCOLAX) 17 g packet Take 17 g by mouth 2 (two) times daily. 14 each 0   SYMBICORT 160-4.5 MCG/ACT inhaler Inhale 1-2  puffs into the lungs as needed.     tamsulosin (FLOMAX) 0.4 MG CAPS capsule Take 0.4 mg by mouth.     traMADol (ULTRAM) 50 MG tablet Take 1-2 tablets (50-100 mg total) by mouth every 6 (six) hours as needed for moderate pain or severe pain. 15 tablet 0   No current facility-administered medications for this visit.    HISTORY OF PRESENT ILLNESS:   Oncology History Overview Note  Cancer Staging No matching staging information was found for the patient.    Malignant neoplasm of prostate (HCC)  08/31/2017 Initial Diagnosis   Malignant neoplasm of prostate (HCC)   10/28/2017 Genetic Testing   The patient had genetic testing due to a personal history of prostate cancer and a family history of breast and stomach cancer.  The Common Hereditary Cancers Panel + Prostate Cancer Panel was ordered.  APC, ATM, AXIN2, BARD1, BMPR1A, BRCA1, BRCA2, BRIP1, CDH1, CDK4, CDKN2A (p14ARF), CDKN2A (p16INK4a), CHEK2, CTNNA1, DICER1, EPCAM*, FANCA,  GREM1*, KIT, MEN1, MLH1, MSH2, MSH3, MSH6, MUTYH, NBN, NF1, PALB2, PDGFRA, PMS2, POLD1, POLE, PTEN, RAD50, RAD51C, RAD51D, SDHB, SDHC, SDHD, SMAD4, SMARCA4, STK11, TP53, TSC1, TSC2, VHL. The following genes were evaluated for sequence changes only: HOXB13*, NTHL1*, SDHA.   Results: Negative, no pathogenic variants identified. The date of this test report is 10/28/2017.    01/05/2018 - 02/12/2018 Radiation Therapy   S/p radiation to the prostate 70 Gy in 28 fractions from 01/05/2018-02/12/2018 per Dr. Kathrynn Running   01/31/2021 Imaging   CT A/P  IMPRESSION: 1. Interval decrease in size of hypodense lesions of the inferior right lobe of the liver and posterior liver dome findings are consistent with treatment response of metastatic lesions. 2. No evidence of new metastatic disease in the abdomen or pelvis. 3. Prostatomegaly.   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  Upper normal 9 mm short axis lymph node in the adjacent mesentery 2.  1.6 x 1.4 cm heterogeneous, poorly defined lesion in the inferior right liver suspicious for metastatic disease, the differential includes GI primary or metastatic prostate cancer No retroperitoneal or pelvic sidewall lymphadenopathy   04/01/2020 PET scan   IMPRESSION: 1. Central mesenteric mass with intense radiotracer activity consistent well differentiated neuroendocrine tumor 2. 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 3. Small peritoneal/nodal metastasis along the LEFT iliac vessels. 4. Very small lesion in the pancreas with intermediate activity could represent primary lesion. No primary bowel lesion identified. 5. Small metastatic implant within the pericardium adjacent  LEFT heart ventricle. 6. No skeletal metastasis.   04/30/2020 Pathology Results   FINAL MICROSCOPIC DIAGNOSIS:  A. 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  Tumor cells are negative  for TTF-1.  The findings are consistent with neuroendocrine tumor of  gastrointestinal origin.  Ki-67 stain shows a proliferative index of  about 1%, consistent with well-differentiated, grade 1 tumor.    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 Held after cycle 1 due to suspected pericarditis and deconditioning.           --  Changed to monthly Lanreotide on 10/18/20.     01/31/2021 Imaging   CT A/P  IMPRESSION: 1. Interval decrease in size of hypodense lesions of the inferior right lobe of the liver and posterior liver dome findings are consistent with treatment response of metastatic lesions. 2. No evidence of new metastatic disease in the abdomen or pelvis. 3. Prostatomegaly.   07/29/2021 Imaging   EXAM: CT CHEST, ABDOMEN, AND PELVIS WITH CONTRAST  IMPRESSION: 1. Slight interval increase in size of the 2 liver metastases. No new liver lesions evident. 2. Stable mild lymphadenopathy in the central small bowel mesentery. 3. 3 mm right upper lobe pulmonary nodule. Attention on follow-up recommended. 4. 4 cm ascending thoracic aortic aneurysm, increased from 3.5 cm on 05/12/2020. Recommend annual imaging followup by CTA or MRA. This recommendation follows 2010 ACCF/AHA/AATS/ACR/ASA/SCA/SCAI/SIR/STS/SVM Guidelines for the Diagnosis and Management of Patients with Thoracic Aortic Disease. Circulation. 2010; 121: U981-X914. Aortic aneurysm NOS (ICD10-I71.9)  5. Aortic Atherosclerosis  (ICD10-I70.0).       REVIEW OF SYSTEMS:   Constitutional: Denies fevers, chills or abnormal weight loss Eyes: Denies blurriness of vision Ears, nose, mouth, throat, and face: Denies mucositis or sore throat Respiratory: Denies cough, dyspnea or wheezes Cardiovascular: Denies palpitation, chest discomfort or lower extremity swelling Gastrointestinal: The patient reporting nausea since 5 AM this morning.  He is also having significant constipation, possibly causing nausea. Skin: Denies abnormal skin rashes Lymphatics: Denies new lymphadenopathy or easy bruising Neurological:Denies numbness, tingling or new weaknesses Behavioral/Psych: Mood is stable, no new changes  All other systems were reviewed with the patient and are negative.   VITALS:   Today's Vitals   12/03/23 1320 12/03/23 1325  BP: (!) 142/98 138/88  Pulse: 96   Resp: (!) 24   Temp: 97.7 F (36.5 C)   TempSrc: Temporal   SpO2: 99%   Weight: 154 lb 8 oz (70.1 kg)   PainSc: 0-No pain    Body mass index is 23.49 kg/m.   Wt Readings from Last 3 Encounters:  12/03/23 154 lb 8 oz (70.1 kg)  09/10/23 161 lb 14.4 oz (73.4 kg)  06/16/23 164 lb 1.6 oz (74.4 kg)    Body mass index is 23.49 kg/m.  Performance status (ECOG): 1 - Symptomatic but completely ambulatory   PHYSICAL EXAM:   GENERAL:alert, no distress and comfortable SKIN: skin color, texture, turgor are normal, no rashes or significant lesions EYES: normal, Conjunctiva are pink and non-injected, sclera clear OROPHARYNX:no exudate, no erythema and lips, buccal mucosa, and tongue normal  NECK: supple, thyroid normal size, non-tender, without nodularity LYMPH:  no palpable lymphadenopathy in the cervical, axillary or inguinal LUNGS: clear to auscultation and percussion with normal breathing effort HEART: regular rate & rhythm and no murmurs and no lower extremity edema ABDOMEN:abdomen soft, non-tender and normal bowel sounds Musculoskeletal:no cyanosis of  digits and no clubbing  NEURO: alert & oriented x 3 with fluent speech, no focal motor/sensory deficits  LABORATORY DATA:  I have reviewed the data as listed    Component Value Date/Time   NA 137 12/03/2023 1301   K 4.0 12/03/2023 1301   CL 101 12/03/2023 1301   CO2 30 12/03/2023 1301   GLUCOSE 120 (H) 12/03/2023 1301   BUN 14 12/03/2023 1301   CREATININE 1.02 12/03/2023 1301   CALCIUM 9.2 12/03/2023 1301   PROT 7.2 12/03/2023 1301   ALBUMIN 4.0 12/03/2023 1301   AST 19 12/03/2023 1301   ALT 15 12/03/2023 1301   ALKPHOS 78 12/03/2023 1301  BILITOT 0.5 12/03/2023 1301   GFRNONAA >60 12/03/2023 1301   GFRAA >60 05/21/2020 1423     Lab Results  Component Value Date   WBC 10.9 (H) 12/03/2023   NEUTROABS 9.5 (H) 12/03/2023   HGB 12.3 (L) 12/03/2023   HCT 38.3 (L) 12/03/2023   MCV 94.6 12/03/2023   PLT 247 12/03/2023

## 2023-12-04 LAB — CHROMOGRANIN A: Chromogranin A (ng/mL): 91.3 ng/mL (ref 0.0–101.8)

## 2023-12-17 ENCOUNTER — Ambulatory Visit (HOSPITAL_COMMUNITY)
Admission: RE | Admit: 2023-12-17 | Discharge: 2023-12-17 | Disposition: A | Discharge status: Home / Self Care | Source: Ambulatory Visit | Attending: Nurse Practitioner | Admitting: Nurse Practitioner

## 2023-12-17 DIAGNOSIS — C7B8 Other secondary neuroendocrine tumors: Secondary | ICD-10-CM | POA: Diagnosis present

## 2023-12-17 DIAGNOSIS — I77810 Thoracic aortic ectasia: Secondary | ICD-10-CM | POA: Insufficient documentation

## 2023-12-17 DIAGNOSIS — R160 Hepatomegaly, not elsewhere classified: Secondary | ICD-10-CM | POA: Diagnosis not present

## 2023-12-17 DIAGNOSIS — R918 Other nonspecific abnormal finding of lung field: Secondary | ICD-10-CM | POA: Insufficient documentation

## 2023-12-17 MED ORDER — IOHEXOL 300 MG/ML  SOLN
100.0000 mL | Freq: Once | INTRAMUSCULAR | Status: AC | PRN
  Administered 2023-12-17: 100 mL via INTRAVENOUS

## 2023-12-17 MED ORDER — SODIUM CHLORIDE (PF) 0.9 % IJ SOLN
INTRAMUSCULAR | Status: AC
  Filled 2023-12-17: qty 50

## 2023-12-31 ENCOUNTER — Inpatient Hospital Stay: Attending: Nurse Practitioner

## 2023-12-31 VITALS — BP 147/77 | HR 70 | Temp 98.8°F | Resp 16

## 2023-12-31 DIAGNOSIS — Z7189 Other specified counseling: Secondary | ICD-10-CM

## 2023-12-31 DIAGNOSIS — C7B8 Other secondary neuroendocrine tumors: Secondary | ICD-10-CM

## 2023-12-31 DIAGNOSIS — C7B02 Secondary carcinoid tumors of liver: Secondary | ICD-10-CM | POA: Insufficient documentation

## 2023-12-31 DIAGNOSIS — C7A00 Malignant carcinoid tumor of unspecified site: Secondary | ICD-10-CM | POA: Diagnosis present

## 2023-12-31 MED ORDER — LANREOTIDE ACETATE 120 MG/0.5ML ~~LOC~~ SOLN
120.0000 mg | Freq: Once | SUBCUTANEOUS | Status: AC
  Administered 2023-12-31: 120 mg via SUBCUTANEOUS
  Filled 2023-12-31: qty 120

## 2024-01-28 ENCOUNTER — Inpatient Hospital Stay

## 2024-01-28 ENCOUNTER — Inpatient Hospital Stay (HOSPITAL_BASED_OUTPATIENT_CLINIC_OR_DEPARTMENT_OTHER): Admitting: Nurse Practitioner

## 2024-01-28 VITALS — BP 138/88 | HR 81 | Temp 97.5°F | Resp 17 | Wt 155.5 lb

## 2024-01-28 DIAGNOSIS — C7B8 Other secondary neuroendocrine tumors: Secondary | ICD-10-CM

## 2024-01-28 DIAGNOSIS — C7A00 Malignant carcinoid tumor of unspecified site: Secondary | ICD-10-CM | POA: Diagnosis not present

## 2024-01-28 DIAGNOSIS — Z7189 Other specified counseling: Secondary | ICD-10-CM

## 2024-01-28 DIAGNOSIS — C7B02 Secondary carcinoid tumors of liver: Secondary | ICD-10-CM

## 2024-01-28 DIAGNOSIS — C61 Malignant neoplasm of prostate: Secondary | ICD-10-CM

## 2024-01-28 LAB — CBC WITH DIFFERENTIAL (CANCER CENTER ONLY)
Abs Immature Granulocytes: 0.02 10*3/uL (ref 0.00–0.07)
Basophils Absolute: 0 10*3/uL (ref 0.0–0.1)
Basophils Relative: 1 %
Eosinophils Absolute: 0.3 10*3/uL (ref 0.0–0.5)
Eosinophils Relative: 4 %
HCT: 37 % — ABNORMAL LOW (ref 39.0–52.0)
Hemoglobin: 12.1 g/dL — ABNORMAL LOW (ref 13.0–17.0)
Immature Granulocytes: 0 %
Lymphocytes Relative: 26 %
Lymphs Abs: 1.7 10*3/uL (ref 0.7–4.0)
MCH: 30.2 pg (ref 26.0–34.0)
MCHC: 32.7 g/dL (ref 30.0–36.0)
MCV: 92.3 fL (ref 80.0–100.0)
Monocytes Absolute: 0.5 10*3/uL (ref 0.1–1.0)
Monocytes Relative: 8 %
Neutro Abs: 4 10*3/uL (ref 1.7–7.7)
Neutrophils Relative %: 61 %
Platelet Count: 255 10*3/uL (ref 150–400)
RBC: 4.01 MIL/uL — ABNORMAL LOW (ref 4.22–5.81)
RDW: 11.5 % (ref 11.5–15.5)
WBC Count: 6.6 10*3/uL (ref 4.0–10.5)
nRBC: 0 % (ref 0.0–0.2)

## 2024-01-28 LAB — CMP (CANCER CENTER ONLY)
ALT: 19 U/L (ref 0–44)
AST: 22 U/L (ref 15–41)
Albumin: 4.1 g/dL (ref 3.5–5.0)
Alkaline Phosphatase: 77 U/L (ref 38–126)
Anion gap: 5 (ref 5–15)
BUN: 8 mg/dL (ref 8–23)
CO2: 33 mmol/L — ABNORMAL HIGH (ref 22–32)
Calcium: 8.9 mg/dL (ref 8.9–10.3)
Chloride: 104 mmol/L (ref 98–111)
Creatinine: 1.05 mg/dL (ref 0.61–1.24)
GFR, Estimated: >60 mL/min (ref >60)
Glucose, Bld: 65 mg/dL — ABNORMAL LOW (ref 70–99)
Potassium: 3.9 mmol/L (ref 3.5–5.1)
Sodium: 142 mmol/L (ref 135–145)
Total Bilirubin: 0.4 mg/dL (ref 0.0–1.2)
Total Protein: 7 g/dL (ref 6.5–8.1)

## 2024-01-28 MED ORDER — LANREOTIDE ACETATE 120 MG/0.5ML ~~LOC~~ SOLN
120.0000 mg | Freq: Once | SUBCUTANEOUS | Status: AC
  Administered 2024-01-28: 120 mg via SUBCUTANEOUS
  Filled 2024-01-28: qty 120

## 2024-01-28 NOTE — Assessment & Plan Note (Addendum)
 Most consistent with GI primary with liver and nodal metastasis -Due to rising PSA, CT/bone scan were obtained showing a mesenteric mass and single liver lesion. DOTATATE PET on 04/09/20 showed intense uptake in the central mesenteric mass with 3 liver metastases and small peritoneal/nodal metastatic deposits along the left iliac vessels. There is no primary bowel lesion identified.   -His 04/30/20 liver biopsy showed metastatic well differentiated/G1 neuroendocrine tumor to the liver. --He began first line Sandostatin  20 mg on 05/10/20. However on 05/12/20 he had pericarditis and treatment was held. he was changed to Lanreotide 120mg  every 4 weeks starting 10/18/20  -he is clinically stable, tolerating treatment well, last dotatate PET scan in December 2023 showed stable primary tumor in small bowel, stable metastatic lesions in peritoneum and liver  -CT CAP from 06/24/2023 showed overall stable disease. -CT CAP 12/17/2023.  He has stable liver lesions.  Central mesenteric mass/lymph node is felt to be similar to previous.  He has a small bowel mass along the left side of the abdomen.  Uncertain about interval growth. Plan to rescan in July or August 2025.  -Continue monthly lanreotide injections and follow-up every 3 months.

## 2024-01-28 NOTE — Progress Notes (Addendum)
 Patient Care Team: Benedetto Brady, MD as PCP - General (Family Medicine) Odie Benne, MD as PCP - Cardiology (Cardiology) Berna Breslow, RN (Inactive) as Oncology Nurse Navigator Sonja [place], MD as Consulting Physician (Oncology) Burton, [name] K, NP as Nurse Practitioner (Nurse Practitioner) Secundino Dach Harvey Linen., MD as Consulting Physician (Urology) Kenith Payer, MD as Consulting Physician (Radiation Oncology) Imaging, [facility] as Consulting Physician (Diagnostic Radiology)  Clinic Day:  01/28/2024  Referring physician: Sonja 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 DOTATATE PET on 04/09/20 showed intense uptake in the central mesenteric mass with 3 liver metastases and small peritoneal/nodal metastatic deposits along the left iliac vessels. There is no primary bowel lesion identified.   -His 04/30/20 liver biopsy showed metastatic well differentiated/G1 neuroendocrine tumor to the liver. --He began first line Sandostatin  20 mg on 05/10/20. However on 05/12/20 he had pericarditis and treatment was held. he was changed to Lanreotide 120mg  every 4 weeks starting 10/18/20  -he is clinically stable, tolerating treatment well, last dotatate PET scan in December 2023 showed stable primary tumor in small bowel, stable metastatic lesions in peritoneum and liver  -CT CAP from 06/24/2023 showed overall stable disease. -CT CAP 12/17/2023.  He has stable liver lesions.  Central mesenteric mass/lymph node is felt to be similar to previous.  He has a small bowel mass along the left side of the abdomen.  Uncertain about interval growth. Plan to rescan in July or August 2025.  -Continue monthly lanreotide injections and follow-up every 3 months.    Intermittent constipation The patient does admit to intermittent constipation.  He does take MiraLAX  every day, 17 g.  This does help keep him regular.  Will take this dose twice daily if he feels himself getting more constipated.  Does help.  History of prostate cancer  Urologist tried to start Erleada to treat symptoms of prostate cancer.  Patient had to stop after just a few weeks.  Developed hives and rash.  He has not seen dermatology for this.  Using a topical steroid twice daily.  Gradually getting better.  Will see urology again in July 2025.  Plan The patient was seen along with Dr. Maryalice Smaller today  Reviewed labs. -CBC showing mild and stable anemia with Hgb 12.1 and HCT 37.0.  CMP essentially normal. Reviewed CT CAP -Overall, felt to be stable disease.  Will repeat scan in July or August 2025 with special attention to possible new finding in the left side of abdomen. Patient labs and presentation are appropriate for lanreotide injection today. Continue with lanreotide injections monthly. Labs, follow-up, and injection in 3 months.  The patient understands the plans discussed today and is in agreement with them.  He knows to contact our office if he develops concerns prior to his next appointment.  I provided 25 minutes of face-to-face time during this encounter and > 50% was spent counseling as documented under my assessment and plan.    Sharyon Deis, NP  [facility] - A DEPT OF [facility] [address] [place] [address] Dept: [phone] Dept Fax: [phone]   Orders Placed This Encounter  Procedures   CT CHEST ABDOMEN PELVIS W CONTRAST  Standing Status:   Future    Expected Date:   03/29/2024    Expiration Date:   01/27/2025    If indicated for the ordered procedure, I authorize the administration of contrast media per Radiology protocol:   Yes    Does the patient have a contrast media/X-ray dye  allergy?:   No    Preferred imaging location?:   [facility]    If indicated for the ordered procedure, I authorize the administration of oral contrast media per Radiology protocol:   Yes   Chromogranin A    Standing Status:   Standing    Number of Occurrences:   12    Expiration Date:   01/27/2025      CHIEF COMPLAINT:  CC: Metastatic neuroendocrine tumor  Current Treatment: Lanreotide injection monthly  INTERVAL HISTORY:  Ryan Wilkerson is here today for repeat clinical assessment.  He was last seen by me on 12/03/2023.  He did have CT CAP for restaging.  This was done 12/17/2023.  He has stable liver lesions.  Central mesenteric mass/lymph node is felt to be similar to previous.  He has a small bowel mass along the left side of the abdomen.  Uncertain about interval growth.  He reports that his appetite is good.  He does have some intermittent constipation.  Takes MiraLAX  every day.  He will take twice daily if he feels constipation developing.  States he does get a "flushing" sensation every so often.  Will have to sit down and rest for few minutes until it passes.  This happens intermittently and has not gotten worse or better over past 3 months.  States that his urologist had started him on Erleada due to history of prostate cancer.  He states that he developed rash and hives after just a few weeks on this medication.  Was told to stop this medication.  He has seen dermatology.  Is being treated with topical steroid which does help. He denies chest pain, chest pressure, or shortness of breath. He denies headaches or visual disturbances. He denies abdominal pain, nausea, vomiting, or changes in bowel or bladder habits.   He denies fevers or chills. He denies pain. His appetite is good. His weight has been stable.  I have reviewed the past medical history, past surgical history, social history and family history with the patient and they are unchanged from previous note.  ALLERGIES:  is  allergic to aspirin , primidone, and simvastatin.  MEDICATIONS:  Current Outpatient Medications  Medication Sig Dispense Refill   acetaminophen  (TYLENOL ) 500 MG tablet Take 1,000 mg by mouth every 6 (six) hours as needed for headache (pain).      clopidogrel  (PLAVIX ) 75 MG tablet Take 75 mg by mouth daily.      dicyclomine  (BENTYL ) 10 MG capsule Take 1 capsule (10 mg total) by mouth every 8 (eight) hours as needed for spasms. 30 capsule 1   fluticasone  (FLONASE ) 50 MCG/ACT nasal spray Place 2 sprays into both nostrils daily. 16 g 0   loratadine  (CLARITIN ) 10 MG tablet Take 10 mg by mouth every morning.     mirtazapine  (REMERON  SOL-TAB) 15 MG disintegrating tablet Take 15 mg by mouth at bedtime.     ondansetron  (ZOFRAN ) 4 MG tablet Take 1 tablet (4 mg total) by mouth every 8 (eight) hours as needed for nausea or vomiting. 30 tablet 1   OVER THE COUNTER MEDICATION Place 1 drop into both eyes daily as needed (dry eyes). Over the counter lubricating  eye drop      pantoprazole  (PROTONIX ) 40 MG tablet Take 1 tablet (40 mg total) by mouth daily. 30 tablet 0   polyethylene glycol (MIRALAX  / GLYCOLAX ) 17 g packet Take 17 g by mouth 2 (two) times daily. 14 each 0   SYMBICORT 160-4.5 MCG/ACT inhaler Inhale 1-2 puffs into the lungs as needed.     tamsulosin  (FLOMAX ) 0.4 MG CAPS capsule Take 0.4 mg by mouth.     traMADol  (ULTRAM ) 50 MG tablet Take 1-2 tablets (50-100 mg total) by mouth every 6 (six) hours as needed for moderate pain or severe pain. 15 tablet 0   No current facility-administered medications for this visit.    HISTORY OF PRESENT ILLNESS:   Oncology History Overview Note  Cancer Staging No matching staging information was found for the patient.    Malignant neoplasm of prostate (HCC)  08/31/2017 Initial Diagnosis   Malignant neoplasm of prostate (HCC)   10/28/2017 Genetic Testing   The patient had genetic testing due to a personal history of prostate cancer and a family history of  breast and stomach cancer.  The Common Hereditary Cancers Panel + Prostate Cancer Panel was ordered.  APC, ATM, AXIN2, BARD1, BMPR1A, BRCA1, BRCA2, BRIP1, CDH1, CDK4, CDKN2A (p14ARF), CDKN2A (p16INK4a), CHEK2, CTNNA1, DICER1, EPCAM*, FANCA, GREM1*, KIT, MEN1, MLH1, MSH2, MSH3, MSH6, MUTYH, NBN, NF1, PALB2, PDGFRA, PMS2, POLD1, POLE, PTEN, RAD50, RAD51C, RAD51D, SDHB, SDHC, SDHD, SMAD4, SMARCA4, STK11, TP53, TSC1, TSC2, VHL. The following genes were evaluated for sequence changes only: HOXB13*, NTHL1*, SDHA.   Results: Negative, no pathogenic variants identified. The date of this test report is 10/28/2017.    01/05/2018 - 02/12/2018 Radiation Therapy   S/p radiation to the prostate 70 Gy in 28 fractions from 01/05/2018-02/12/2018 per Dr. Lorri Rota   01/31/2021 Imaging   CT A/P  IMPRESSION: 1. Interval decrease in size of hypodense lesions of the inferior right lobe of the liver and posterior liver dome findings are consistent with treatment response of metastatic lesions. 2. No evidence of new metastatic disease in the abdomen or pelvis. 3. Prostatomegaly.   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  Upper normal 9 mm short axis lymph node in the adjacent mesentery 2.  1.6 x 1.4 cm heterogeneous, poorly defined lesion in the inferior right liver suspicious for metastatic disease, the differential includes GI primary or metastatic prostate cancer No retroperitoneal or pelvic sidewall lymphadenopathy   04/01/2020 PET scan   IMPRESSION: 1. Central mesenteric mass with intense radiotracer activity consistent well differentiated neuroendocrine tumor 2. 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 3. Small  peritoneal/nodal metastasis along the LEFT iliac vessels. 4. Very small lesion in the pancreas with intermediate activity could represent primary lesion. No primary bowel lesion identified. 5. Small metastatic implant within the pericardium adjacent LEFT heart ventricle. 6. No skeletal metastasis.   04/30/2020 Pathology Results   FINAL MICROSCOPIC DIAGNOSIS:  A. 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  Tumor cells are negative  for TTF-1.  The findings are consistent with neuroendocrine tumor of  gastrointestinal origin.  Ki-67 stain shows a proliferative index of  about 1%,  consistent with well-differentiated, grade 1 tumor.    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 Held after cycle 1 due to suspected pericarditis and deconditioning.           --Changed to monthly Lanreotide on 10/18/20.     01/31/2021 Imaging   CT A/P  IMPRESSION: 1. Interval decrease in size of hypodense lesions of the inferior right lobe of the liver and posterior liver dome findings are consistent with treatment response of metastatic lesions. 2. No evidence of new metastatic disease in the abdomen or pelvis. 3. Prostatomegaly.   07/29/2021 Imaging   EXAM: CT CHEST, ABDOMEN, AND PELVIS WITH CONTRAST  IMPRESSION: 1. Slight interval increase in size of the 2 liver metastases. No new liver lesions evident. 2. Stable mild lymphadenopathy in the central small bowel mesentery. 3. 3 mm right upper lobe pulmonary nodule. Attention on follow-up recommended. 4. 4 cm ascending thoracic aortic aneurysm, increased from 3.5 cm on 05/12/2020. Recommend annual imaging followup by CTA or MRA. This recommendation  follows 2010 ACCF/AHA/AATS/ACR/ASA/SCA/SCAI/SIR/STS/SVM Guidelines for the Diagnosis and Management of Patients with Thoracic Aortic Disease. Circulation. 2010; 121: Z610-R604. Aortic aneurysm NOS (ICD10-I71.9)  5. Aortic Atherosclerosis (ICD10-I70.0).       REVIEW OF SYSTEMS:   Constitutional: Denies fevers, chills or abnormal weight loss. Easily fatigued.  Eyes: Denies blurriness of vision Ears, nose, mouth, throat, and face: Denies mucositis or sore throat Respiratory: Denies cough, dyspnea or wheezes Cardiovascular: Denies palpitation, chest discomfort or lower extremity swelling Gastrointestinal:  Denies nausea, vomiting, or heartburn. Does get intermittent constipation.  Skin: Denies abnormal skin rashes Lymphatics: Denies new lymphadenopathy or easy bruising Neurological:Denies numbness, tingling or new weaknesses Behavioral/Psych: Mood is stable, no new changes  All other systems were reviewed with the patient and are negative.   VITALS:   Today's Vitals   01/28/24 1346 01/28/24 1419  BP: 138/88   Pulse: 81   Resp: 17   Temp: (!) 97.5 F (36.4 C)   SpO2: 98%   Weight: 155 lb 8 oz (70.5 kg)   PainSc:  0-No pain   Body mass index is 23.64 kg/m.   Wt Readings from Last 3 Encounters:  01/28/24 155 lb 8 oz (70.5 kg)  12/03/23 154 lb 8 oz (70.1 kg)  09/10/23 161 lb 14.4 oz (73.4 kg)    Body mass index is 23.64 kg/m.  Performance status (ECOG): 1 - Symptomatic but completely ambulatory  PHYSICAL EXAM:   GENERAL:alert, no distress and comfortable SKIN: skin color, texture, turgor are normal, no rashes or significant lesions EYES: normal, Conjunctiva are pink and non-injected, sclera clear OROPHARYNX:no exudate, no erythema and lips, buccal mucosa, and tongue normal  NECK: supple, thyroid  normal size, non-tender, without nodularity LYMPH:  no palpable lymphadenopathy in the cervical, axillary or inguinal LUNGS: clear to auscultation and percussion with normal  breathing effort HEART: regular rate & rhythm and no murmurs and no lower extremity edema ABDOMEN:abdomen soft, non-tender and normal bowel sounds Musculoskeletal:no cyanosis of digits and no clubbing  NEURO: alert & oriented x 3 with fluent speech, no focal motor/sensory deficits  LABORATORY DATA:  I have reviewed the data as listed    Component Value Date/Time   NA 142 01/28/2024 1315   K 3.9 01/28/2024 1315   CL 104 01/28/2024 1315   CO2 33 (H) 01/28/2024 1315   GLUCOSE 65 (L) 01/28/2024 1315   BUN  8 01/28/2024 1315   CREATININE 1.05 01/28/2024 1315   CALCIUM 8.9 01/28/2024 1315   PROT 7.0 01/28/2024 1315   ALBUMIN 4.1 01/28/2024 1315   AST 22 01/28/2024 1315   ALT 19 01/28/2024 1315   ALKPHOS 77 01/28/2024 1315   BILITOT 0.4 01/28/2024 1315   GFRNONAA >60 01/28/2024 1315   GFRAA >60 05/21/2020 1423     Lab Results  Component Value Date   WBC 6.6 01/28/2024   NEUTROABS 4.0 01/28/2024   HGB 12.1 (L) 01/28/2024   HCT 37.0 (L) 01/28/2024   MCV 92.3 01/28/2024   PLT 255 01/28/2024     Addendum I have seen the patient, examined him. I agree with the assessment and and plan and have edited the notes.   Mr. Bradburn is clinically doing well overall, I personally reviewed his restaging CT scan, which showed stable disease.  No new concerns.  We discussed constipation management, and watch for bowel obstruction symptoms.  Will continue lanreotide injection monthly.  All questions were answered.  Sonja McClelland MD 01/28/2024

## 2024-01-31 ENCOUNTER — Encounter: Payer: Self-pay | Admitting: Hematology

## 2024-02-25 ENCOUNTER — Inpatient Hospital Stay: Attending: Nurse Practitioner

## 2024-02-25 ENCOUNTER — Inpatient Hospital Stay

## 2024-02-25 VITALS — BP 159/67 | HR 88 | Temp 98.5°F | Resp 18

## 2024-02-25 DIAGNOSIS — C61 Malignant neoplasm of prostate: Secondary | ICD-10-CM

## 2024-02-25 DIAGNOSIS — C7B02 Secondary carcinoid tumors of liver: Secondary | ICD-10-CM | POA: Insufficient documentation

## 2024-02-25 DIAGNOSIS — C7A00 Malignant carcinoid tumor of unspecified site: Secondary | ICD-10-CM | POA: Insufficient documentation

## 2024-02-25 DIAGNOSIS — Z7189 Other specified counseling: Secondary | ICD-10-CM

## 2024-02-25 DIAGNOSIS — C7B8 Other secondary neuroendocrine tumors: Secondary | ICD-10-CM

## 2024-02-25 LAB — CMP (CANCER CENTER ONLY)
ALT: 16 U/L (ref 0–44)
AST: 24 U/L (ref 15–41)
Albumin: 4.1 g/dL (ref 3.5–5.0)
Alkaline Phosphatase: 74 U/L (ref 38–126)
Anion gap: 6 (ref 5–15)
BUN: 10 mg/dL (ref 8–23)
CO2: 30 mmol/L (ref 22–32)
Calcium: 9 mg/dL (ref 8.9–10.3)
Chloride: 103 mmol/L (ref 98–111)
Creatinine: 1.15 mg/dL (ref 0.61–1.24)
GFR, Estimated: >60 mL/min
Glucose, Bld: 165 mg/dL — ABNORMAL HIGH (ref 70–99)
Potassium: 4.1 mmol/L (ref 3.5–5.1)
Sodium: 139 mmol/L (ref 135–145)
Total Bilirubin: 0.5 mg/dL (ref 0.0–1.2)
Total Protein: 7 g/dL (ref 6.5–8.1)

## 2024-02-25 LAB — CBC WITH DIFFERENTIAL (CANCER CENTER ONLY)
Abs Immature Granulocytes: 0.01 K/uL (ref 0.00–0.07)
Basophils Absolute: 0 K/uL (ref 0.0–0.1)
Basophils Relative: 1 %
Eosinophils Absolute: 0.2 K/uL (ref 0.0–0.5)
Eosinophils Relative: 4 %
HCT: 37.1 % — ABNORMAL LOW (ref 39.0–52.0)
Hemoglobin: 12.2 g/dL — ABNORMAL LOW (ref 13.0–17.0)
Immature Granulocytes: 0 %
Lymphocytes Relative: 21 %
Lymphs Abs: 1.3 K/uL (ref 0.7–4.0)
MCH: 30.3 pg (ref 26.0–34.0)
MCHC: 32.9 g/dL (ref 30.0–36.0)
MCV: 92.3 fL (ref 80.0–100.0)
Monocytes Absolute: 0.5 K/uL (ref 0.1–1.0)
Monocytes Relative: 8 %
Neutro Abs: 4 K/uL (ref 1.7–7.7)
Neutrophils Relative %: 66 %
Platelet Count: 233 K/uL (ref 150–400)
RBC: 4.02 MIL/uL — ABNORMAL LOW (ref 4.22–5.81)
RDW: 11.4 % — ABNORMAL LOW (ref 11.5–15.5)
WBC Count: 6 K/uL (ref 4.0–10.5)
nRBC: 0 % (ref 0.0–0.2)

## 2024-02-25 MED ORDER — LANREOTIDE ACETATE 120 MG/0.5ML ~~LOC~~ SOLN
120.0000 mg | Freq: Once | SUBCUTANEOUS | Status: AC
Start: 2024-02-25 — End: 2024-02-25
  Administered 2024-02-25: 120 mg via SUBCUTANEOUS
  Filled 2024-02-25: qty 120

## 2024-02-29 LAB — CHROMOGRANIN A: Chromogranin A (ng/mL): 71.3 ng/mL (ref 0.0–101.8)

## 2024-03-24 ENCOUNTER — Inpatient Hospital Stay

## 2024-03-24 ENCOUNTER — Inpatient Hospital Stay: Attending: Nurse Practitioner

## 2024-03-24 VITALS — BP 142/79 | HR 82 | Resp 18

## 2024-03-24 DIAGNOSIS — C7B8 Other secondary neuroendocrine tumors: Secondary | ICD-10-CM

## 2024-03-24 DIAGNOSIS — Z7189 Other specified counseling: Secondary | ICD-10-CM

## 2024-03-24 DIAGNOSIS — C61 Malignant neoplasm of prostate: Secondary | ICD-10-CM

## 2024-03-24 DIAGNOSIS — C7B02 Secondary carcinoid tumors of liver: Secondary | ICD-10-CM | POA: Diagnosis present

## 2024-03-24 DIAGNOSIS — C7A00 Malignant carcinoid tumor of unspecified site: Secondary | ICD-10-CM | POA: Diagnosis present

## 2024-03-24 LAB — CBC WITH DIFFERENTIAL (CANCER CENTER ONLY)
Abs Immature Granulocytes: 0.01 K/uL (ref 0.00–0.07)
Basophils Absolute: 0 K/uL (ref 0.0–0.1)
Basophils Relative: 1 %
Eosinophils Absolute: 0.4 K/uL (ref 0.0–0.5)
Eosinophils Relative: 6 %
HCT: 35 % — ABNORMAL LOW (ref 39.0–52.0)
Hemoglobin: 11.3 g/dL — ABNORMAL LOW (ref 13.0–17.0)
Immature Granulocytes: 0 %
Lymphocytes Relative: 25 %
Lymphs Abs: 1.5 K/uL (ref 0.7–4.0)
MCH: 29.7 pg (ref 26.0–34.0)
MCHC: 32.3 g/dL (ref 30.0–36.0)
MCV: 92.1 fL (ref 80.0–100.0)
Monocytes Absolute: 0.5 K/uL (ref 0.1–1.0)
Monocytes Relative: 9 %
Neutro Abs: 3.7 K/uL (ref 1.7–7.7)
Neutrophils Relative %: 59 %
Platelet Count: 189 K/uL (ref 150–400)
RBC: 3.8 MIL/uL — ABNORMAL LOW (ref 4.22–5.81)
RDW: 11.6 % (ref 11.5–15.5)
WBC Count: 6.3 K/uL (ref 4.0–10.5)
nRBC: 0 % (ref 0.0–0.2)

## 2024-03-24 LAB — CMP (CANCER CENTER ONLY)
ALT: 18 U/L (ref 0–44)
AST: 22 U/L (ref 15–41)
Albumin: 3.7 g/dL (ref 3.5–5.0)
Alkaline Phosphatase: 61 U/L (ref 38–126)
Anion gap: 5 (ref 5–15)
BUN: 12 mg/dL (ref 8–23)
CO2: 30 mmol/L (ref 22–32)
Calcium: 8.6 mg/dL — ABNORMAL LOW (ref 8.9–10.3)
Chloride: 105 mmol/L (ref 98–111)
Creatinine: 1.17 mg/dL (ref 0.61–1.24)
GFR, Estimated: >60 mL/min (ref >60)
Glucose, Bld: 92 mg/dL (ref 70–99)
Potassium: 3.9 mmol/L (ref 3.5–5.1)
Sodium: 140 mmol/L (ref 135–145)
Total Bilirubin: 0.4 mg/dL (ref 0.0–1.2)
Total Protein: 6.3 g/dL — ABNORMAL LOW (ref 6.5–8.1)

## 2024-03-24 MED ORDER — LANREOTIDE ACETATE 120 MG/0.5ML ~~LOC~~ SOLN
120.0000 mg | Freq: Once | SUBCUTANEOUS | Status: AC
  Administered 2024-03-24: 120 mg via SUBCUTANEOUS
  Filled 2024-03-24: qty 120

## 2024-03-25 LAB — CHROMOGRANIN A: Chromogranin A (ng/mL): 80.5 ng/mL (ref 0.0–101.8)

## 2024-03-29 ENCOUNTER — Ambulatory Visit (HOSPITAL_COMMUNITY)
Admission: RE | Admit: 2024-03-29 | Discharge: 2024-03-29 | Disposition: A | Discharge status: Home / Self Care | Source: Ambulatory Visit | Attending: Nurse Practitioner | Admitting: Nurse Practitioner

## 2024-03-29 DIAGNOSIS — I700 Atherosclerosis of aorta: Secondary | ICD-10-CM | POA: Diagnosis not present

## 2024-03-29 DIAGNOSIS — C7A8 Other malignant neuroendocrine tumors: Secondary | ICD-10-CM | POA: Insufficient documentation

## 2024-03-29 DIAGNOSIS — C7B8 Other secondary neuroendocrine tumors: Secondary | ICD-10-CM | POA: Insufficient documentation

## 2024-03-29 DIAGNOSIS — R590 Localized enlarged lymph nodes: Secondary | ICD-10-CM | POA: Insufficient documentation

## 2024-03-29 MED ORDER — IOHEXOL 300 MG/ML  SOLN
100.0000 mL | Freq: Once | INTRAMUSCULAR | Status: AC | PRN
  Administered 2024-03-29: 100 mL via INTRAVENOUS

## 2024-04-21 ENCOUNTER — Other Ambulatory Visit (HOSPITAL_COMMUNITY): Payer: Self-pay | Admitting: Urology

## 2024-04-21 ENCOUNTER — Inpatient Hospital Stay: Attending: Nurse Practitioner | Admitting: Hematology

## 2024-04-21 ENCOUNTER — Encounter: Payer: Self-pay | Admitting: Hematology

## 2024-04-21 ENCOUNTER — Inpatient Hospital Stay

## 2024-04-21 VITALS — BP 116/70 | Temp 97.7°F | Resp 17 | Wt 155.8 lb

## 2024-04-21 DIAGNOSIS — C7B8 Other secondary neuroendocrine tumors: Secondary | ICD-10-CM | POA: Diagnosis not present

## 2024-04-21 DIAGNOSIS — C61 Malignant neoplasm of prostate: Secondary | ICD-10-CM

## 2024-04-21 DIAGNOSIS — Z7189 Other specified counseling: Secondary | ICD-10-CM

## 2024-04-21 DIAGNOSIS — Z8042 Family history of malignant neoplasm of prostate: Secondary | ICD-10-CM | POA: Diagnosis not present

## 2024-04-21 DIAGNOSIS — D649 Anemia, unspecified: Secondary | ICD-10-CM | POA: Diagnosis not present

## 2024-04-21 DIAGNOSIS — C7A00 Malignant carcinoid tumor of unspecified site: Secondary | ICD-10-CM | POA: Diagnosis present

## 2024-04-21 DIAGNOSIS — Z800 Family history of malignant neoplasm of digestive organs: Secondary | ICD-10-CM | POA: Diagnosis not present

## 2024-04-21 DIAGNOSIS — Z9221 Personal history of antineoplastic chemotherapy: Secondary | ICD-10-CM | POA: Diagnosis not present

## 2024-04-21 DIAGNOSIS — C775 Secondary and unspecified malignant neoplasm of intrapelvic lymph nodes: Secondary | ICD-10-CM

## 2024-04-21 DIAGNOSIS — K5909 Other constipation: Secondary | ICD-10-CM | POA: Diagnosis not present

## 2024-04-21 DIAGNOSIS — Z923 Personal history of irradiation: Secondary | ICD-10-CM | POA: Diagnosis not present

## 2024-04-21 DIAGNOSIS — Z8546 Personal history of malignant neoplasm of prostate: Secondary | ICD-10-CM | POA: Diagnosis not present

## 2024-04-21 DIAGNOSIS — Z803 Family history of malignant neoplasm of breast: Secondary | ICD-10-CM | POA: Diagnosis not present

## 2024-04-21 DIAGNOSIS — C7B02 Secondary carcinoid tumors of liver: Secondary | ICD-10-CM | POA: Diagnosis present

## 2024-04-21 LAB — CMP (CANCER CENTER ONLY)
ALT: 18 U/L (ref 0–44)
AST: 23 U/L (ref 15–41)
Albumin: 4.1 g/dL (ref 3.5–5.0)
Alkaline Phosphatase: 68 U/L (ref 38–126)
Anion gap: 4 — ABNORMAL LOW (ref 5–15)
BUN: 11 mg/dL (ref 8–23)
CO2: 31 mmol/L (ref 22–32)
Calcium: 8.9 mg/dL (ref 8.9–10.3)
Chloride: 104 mmol/L (ref 98–111)
Creatinine: 1.14 mg/dL (ref 0.61–1.24)
GFR, Estimated: >60 mL/min (ref >60)
Glucose, Bld: 89 mg/dL (ref 70–99)
Potassium: 4.7 mmol/L (ref 3.5–5.1)
Sodium: 139 mmol/L (ref 135–145)
Total Bilirubin: 0.5 mg/dL (ref 0.0–1.2)
Total Protein: 6.7 g/dL (ref 6.5–8.1)

## 2024-04-21 LAB — CBC WITH DIFFERENTIAL (CANCER CENTER ONLY)
Abs Immature Granulocytes: 0 K/uL (ref 0.00–0.07)
Basophils Absolute: 0 K/uL (ref 0.0–0.1)
Basophils Relative: 1 %
Eosinophils Absolute: 0.4 K/uL (ref 0.0–0.5)
Eosinophils Relative: 7 %
HCT: 36.9 % — ABNORMAL LOW (ref 39.0–52.0)
Hemoglobin: 12.1 g/dL — ABNORMAL LOW (ref 13.0–17.0)
Immature Granulocytes: 0 %
Lymphocytes Relative: 27 %
Lymphs Abs: 1.6 K/uL (ref 0.7–4.0)
MCH: 30.2 pg (ref 26.0–34.0)
MCHC: 32.8 g/dL (ref 30.0–36.0)
MCV: 92 fL (ref 80.0–100.0)
Monocytes Absolute: 0.5 K/uL (ref 0.1–1.0)
Monocytes Relative: 9 %
Neutro Abs: 3.2 K/uL (ref 1.7–7.7)
Neutrophils Relative %: 56 %
Platelet Count: 206 K/uL (ref 150–400)
RBC: 4.01 MIL/uL — ABNORMAL LOW (ref 4.22–5.81)
RDW: 11.6 % (ref 11.5–15.5)
WBC Count: 5.7 K/uL (ref 4.0–10.5)
nRBC: 0 % (ref 0.0–0.2)

## 2024-04-21 MED ORDER — LANREOTIDE ACETATE 120 MG/0.5ML ~~LOC~~ SOLN
120.0000 mg | Freq: Once | SUBCUTANEOUS | Status: AC
Start: 2024-04-21 — End: 2024-04-21
  Administered 2024-04-21: 120 mg via SUBCUTANEOUS
  Filled 2024-04-21: qty 120

## 2024-04-21 NOTE — Assessment & Plan Note (Signed)
 Most consistent with GI primary with liver and nodal metastasis -Due to rising PSA, CT/bone scan were obtained showing a mesenteric mass and single liver lesion. DOTATATE PET on 04/09/20 showed intense uptake in the central mesenteric mass with 3 liver metastases and small peritoneal/nodal metastatic deposits along the left iliac vessels. There is no primary bowel lesion identified.   -His 04/30/20 liver biopsy showed metastatic well differentiated/G1 neuroendocrine tumor to the liver. --He began first line Sandostatin  20 mg on 05/10/20. However on 05/12/20 he had pericarditis and treatment was held. I changed him to Lanreotide 120mg  every 4 weeks starting 10/18/20  -he is clinically stable, tolerating treatment well, last dotatate PET scan in December 2023 showed stable primary tumor in small bowel, stable metastatic lesions in peritoneum and liver  -CT 03/29/2024 showed overall stable disease

## 2024-04-21 NOTE — Progress Notes (Signed)
 [facility]   [phone] [phone]   Clinic Follow up Note   Patient Care Team: Leonel Cole, MD as PCP - General (Family Medicine) Verlin Lonni BIRCH, MD as PCP - Cardiology (Cardiology) Lanny Callander, MD as Consulting Physician (Oncology) Burton, [name] K, NP as Nurse Practitioner (Nurse Practitioner) Alvaro Ricardo KATHEE Raddle., MD as Consulting Physician (Urology) Patrcia Cough, MD as Consulting Physician (Radiation Oncology) Imaging, [facility] as Consulting Physician (Diagnostic Radiology)  Date of Service:  04/21/2024  CHIEF COMPLAINT: f/u of metastatic neuroendocrine tumor  CURRENT THERAPY:  Lanreotide injection every month  Oncology History   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 DOTATATE PET on 04/09/20 showed intense uptake in the central mesenteric mass with 3 liver metastases and small peritoneal/nodal metastatic deposits along the left iliac vessels. There is no primary bowel lesion identified.   -His 04/30/20 liver biopsy showed metastatic well differentiated/G1 neuroendocrine tumor to the liver. --He began first line Sandostatin  20 mg on 05/10/20. However on 05/12/20 he had pericarditis and treatment was held. 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 The liver lesion has increased slightly from 1.3 cm to 1.5 cm, which is  considered a minimal change. Lymph nodes are stable. Tumor markers are now within the normal range. Current treatment with injections is deemed effective, and there is no need to change the treatment plan at this time. Alternative treatment options, such as hospital infusion IV and oral pills, are available if needed in the future. - Continue current injection therapy monthly - Discuss potential future treatment options if needed, including hospital infusion IV and oral pills  Constipation Chronic constipation managed with Miralax . He reports regular bowel movements but remains constipated. He attempts to consume vegetables and fruits, but they do not agree with him. Fiber and probiotics are used to aid bowel movements.  Anemia, mild Mild anemia is present but remains stable and close to normal. No intervention is required at this time.   Plan - Restaging CT scan image and results reviewed with patient, overall stable disease - Will proceed lanreotide injection today and continue every 4 weeks - Lab and follow-up in 3 months  SUMMARY OF ONCOLOGIC HISTORY: Oncology History Overview Note  Cancer Staging No matching staging information was found for the patient.    Malignant neoplasm of prostate (HCC)  08/31/2017 Initial Diagnosis   Malignant neoplasm of prostate (HCC)   10/28/2017 Genetic Testing   The patient had genetic testing due to a personal history of prostate cancer and a family history of breast and stomach cancer.  The Common Hereditary Cancers Panel + Prostate Cancer Panel was ordered.  APC, ATM, AXIN2, BARD1, BMPR1A, BRCA1, BRCA2, BRIP1, CDH1, CDK4, CDKN2A (p14ARF), CDKN2A (p16INK4a), CHEK2, CTNNA1, DICER1, EPCAM*, FANCA, GREM1*, KIT, MEN1, MLH1, MSH2, MSH3, MSH6, MUTYH, NBN, NF1, PALB2, PDGFRA, PMS2, POLD1, POLE, PTEN, RAD50, RAD51C, RAD51D, SDHB, SDHC, SDHD, SMAD4, SMARCA4, STK11, TP53, TSC1, TSC2, VHL. The following genes were  evaluated for sequence changes only: HOXB13*,  NTHL1*, SDHA.   Results: Negative, no pathogenic variants identified. The date of this test report is 10/28/2017.    01/05/2018 - 02/12/2018 Radiation Therapy   S/p radiation to the prostate 70 Gy in 28 fractions from 01/05/2018-02/12/2018 per Dr. Patrcia   01/31/2021 Imaging   CT A/P  IMPRESSION: 1. Interval decrease in size of hypodense lesions of the inferior right lobe of the liver and posterior liver dome findings are consistent with treatment response of metastatic lesions. 2. No evidence of new metastatic disease in the abdomen or pelvis. 3. Prostatomegaly.   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  Upper normal 9 mm short axis lymph node in the adjacent mesentery 2.  1.6 x 1.4 cm heterogeneous, poorly defined lesion in the inferior right liver suspicious for metastatic disease, the differential includes GI primary or metastatic prostate cancer No retroperitoneal or pelvic sidewall lymphadenopathy   04/01/2020 PET scan   IMPRESSION: 1. Central mesenteric mass with intense radiotracer activity consistent well differentiated neuroendocrine tumor 2. 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 3. Small peritoneal/nodal metastasis along the LEFT iliac vessels. 4. Very small lesion in the pancreas with intermediate activity could represent primary lesion. No primary bowel lesion identified. 5. Small metastatic implant within the pericardium adjacent LEFT heart ventricle. 6. No skeletal metastasis.   04/30/2020 Pathology Results   FINAL MICROSCOPIC DIAGNOSIS:  A. LIVER, BIOPSY:  - Metastatic well-differentiated (G1) neuroendocrine tumor to the liver COMMENT:  Immunohistochemical stains show that  the tumor cells are positive for  synaptophysin, chromogranin, CD56 and CDX2.  Tumor cells are negative  for TTF-1.  The findings are consistent with neuroendocrine tumor of  gastrointestinal origin.  Ki-67 stain shows a proliferative index of  about 1%, consistent with well-differentiated, grade 1 tumor.    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 Held after cycle 1 due to suspected pericarditis and deconditioning.           --Changed to monthly Lanreotide on 10/18/20.     01/31/2021 Imaging   CT A/P  IMPRESSION: 1. Interval decrease in size of hypodense lesions of the inferior right lobe of the liver and posterior liver dome findings are consistent with treatment response of metastatic lesions. 2. No evidence of new metastatic disease in the abdomen or pelvis. 3. Prostatomegaly.   07/29/2021 Imaging   EXAM: CT CHEST, ABDOMEN, AND PELVIS WITH CONTRAST  IMPRESSION: 1. Slight interval increase in size of the 2 liver metastases. No new liver lesions evident. 2. Stable mild lymphadenopathy in the central small bowel mesentery. 3. 3 mm right upper lobe pulmonary nodule. Attention on follow-up recommended. 4. 4 cm ascending thoracic aortic aneurysm, increased from 3.5 cm on 05/12/2020. Recommend annual imaging followup by CTA or MRA. This recommendation follows 2010 ACCF/AHA/AATS/ACR/ASA/SCA/SCAI/SIR/STS/SVM Guidelines for the Diagnosis and Management of Patients with Thoracic Aortic Disease. Circulation. 2010; 121: Z733-z630. Aortic aneurysm NOS (ICD10-I71.9)  5. Aortic Atherosclerosis (ICD10-I70.0).      Discussed the use of AI scribe software for clinical note transcription with the patient, who gave verbal consent to proceed.  History of Present Illness Ryan Wilkerson  is a 78 year old male with a neuroendocrine tumor who presents for  follow-up of neuroendocrine treatment.  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 He has occasional stomach pain lasting five to ten minutes and chronic constipation, managed with Miralax , fiber, and probiotics. He avoids vegetables due to intolerance. He takes several medications, including a morning blood pressure medication that may cause dizziness and hypotension, Tamsulosin  at night, Claritin , and pantoprazole .     All other systems were reviewed with the patient and are negative.  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 [facility])    Nocturia    Pericardial effusion    Pericarditis    Prostate cancer [facility]) urologist-- dr alvaro /  oncologist-- dr manning   prostate bx 06-24-2016 and 09-15-2017 at dr alvaro office  Stage T1c, Gleason 4+3, PSA 8.48, vol 33cc---  planned external beam radiation therpay   Tobacco abuse    Urgency of urination    Wears glasses     SURGICAL HISTORY: Past Surgical History:  Procedure Laterality Date   COLONOSCOPY  last one 06/ 2018   GOLD SEED IMPLANT N/A 12/23/2017   Procedure: GOLD SEED IMPLANT;  Surgeon: alvaro Hummer, MD;  Location: [facility];  Service: Urology;  Laterality: N/A;   LEFT HEART CATH AND CORONARY ANGIOGRAPHY N/A 05/12/2020   Procedure: LEFT HEART CATH AND CORONARY ANGIOGRAPHY;  Surgeon: Verlin Lonni BIRCH, MD;  Location: [facility] INVASIVE CV LAB;  Service: Cardiovascular;  Laterality: N/A;   PROSTATE BIOPSY  06-24-2016;  09-15-2017-- at dr alvaro  office   SPACE OAR INSTILLATION N/A 12/23/2017   Procedure: SPACE OAR INSTILLATION;  Surgeon: alvaro Hummer, MD;  Location: [facility];  Service: Urology;  Laterality: N/A;    I have reviewed the social history and family history with the patient and they are unchanged from previous note.  ALLERGIES:  is allergic to aspirin , primidone, and simvastatin.  MEDICATIONS:  Current Outpatient Medications  Medication Sig Dispense Refill   acetaminophen  (TYLENOL ) 500 MG tablet Take 1,000 mg by mouth every 6 (six) hours as needed for headache (pain).      clopidogrel  (PLAVIX ) 75 MG tablet Take 75 mg by mouth daily.      dicyclomine  (BENTYL ) 10 MG capsule Take 1 capsule (10 mg total) by mouth every 8 (eight) hours as needed for spasms. 30 capsule 1   fluticasone  (FLONASE ) 50 MCG/ACT nasal spray Place 2 sprays into both nostrils daily. 16 g 0   loratadine  (CLARITIN ) 10 MG tablet Take 10 mg by mouth every morning.     mirtazapine  (REMERON  SOL-TAB) 15 MG disintegrating tablet Take 15 mg by mouth at bedtime.     ondansetron  (ZOFRAN ) 4 MG tablet Take 1 tablet (4 mg total) by mouth every 8 (eight) hours as needed for nausea or vomiting. 30 tablet 1   OVER THE COUNTER MEDICATION Place 1 drop into both eyes daily as needed (dry eyes). Over the counter lubricating  eye drop      pantoprazole  (PROTONIX ) 40 MG tablet Take 1 tablet (40 mg total) by mouth daily. 30 tablet 0   polyethylene glycol (MIRALAX  / GLYCOLAX ) 17 g packet Take 17 g by mouth 2 (two) times daily. 14 each 0   SYMBICORT 160-4.5 MCG/ACT inhaler Inhale 1-2 puffs into the lungs as needed.     tamsulosin  (FLOMAX ) 0.4 MG CAPS capsule Take 0.4 mg by mouth.     traMADol  (ULTRAM ) 50 MG tablet Take 1-2 tablets (50-100 mg total) by mouth every 6 (six) hours as needed for moderate pain or severe pain. 15 tablet 0   No current facility-administered medications for this visit.    PHYSICAL EXAMINATION: ECOG PERFORMANCE STATUS: 1 -  Symptomatic but completely ambulatory  Vitals:   04/21/24 1322  BP: 116/70  Resp: 17  Temp: 97.7 F (36.5 C)  SpO2: 95%   Wt Readings from Last 3 Encounters:  04/21/24 155 lb 12.8 oz (70.7 kg)  01/28/24 155 lb 8 oz (70.5 kg)  12/03/23 154 lb 8 oz (70.1 kg)     GENERAL:alert, no distress and comfortable SKIN: skin color, texture, turgor are normal, no rashes or significant lesions EYES: normal, Conjunctiva are pink and non-injected, sclera clear NECK: supple, thyroid  normal size, non-tender, without nodularity LYMPH:  no palpable lymphadenopathy in the cervical, axillary  LUNGS: clear to auscultation and percussion with normal breathing effort HEART: regular rate & rhythm and no murmurs and no lower extremity edema ABDOMEN:abdomen soft, non-tender and normal bowel sounds Musculoskeletal:no cyanosis of digits and no clubbing  NEURO: alert & oriented x 3 with fluent speech, no focal motor/sensory deficits  Physical Exam    LABORATORY DATA:  I have reviewed the data as listed    Latest Ref Rng & Units 04/21/2024    1:11 PM 03/24/2024    1:03 PM 02/25/2024   12:41 PM  CBC  WBC 4.0 - 10.5 K/uL 5.7  6.3  6.0   Hemoglobin 13.0 - 17.0 g/dL 87.8  88.6  87.7   Hematocrit 39.0 - 52.0 % 36.9  35.0  37.1   Platelets 150 - 400 K/uL 206  189  233         Latest Ref Rng & Units 04/21/2024    1:11 PM 03/24/2024    1:03 PM 02/25/2024   12:41 PM  CMP  Glucose 70 - 99 mg/dL 89  92  834   BUN 8 - 23 mg/dL 11  12  10    Creatinine 0.61 - 1.24 mg/dL 8.85  8.82  8.84   Sodium 135 - 145 mmol/L 139  140  139   Potassium 3.5 - 5.1 mmol/L 4.7  3.9  4.1   Chloride 98 - 111 mmol/L 104  105  103   CO2 22 - 32 mmol/L 31  30  30    Calcium 8.9 - 10.3 mg/dL 8.9  8.6  9.0   Total Protein 6.5 - 8.1 g/dL 6.7  6.3  7.0   Total Bilirubin 0.0 - 1.2 mg/dL 0.5  0.4  0.5   Alkaline Phos 38 - 126 U/L 68  61  74   AST 15 - 41 U/L 23  22  24    ALT 0 - 44 U/L 18  18  16        RADIOGRAPHIC STUDIES: I  have personally reviewed the radiological images as listed and agreed with the findings in the report. No results found.    No orders of the defined  types were placed in this encounter.  All questions were answered. The patient knows to call the clinic with any problems, questions or concerns. No barriers to learning was detected. The total time spent in the appointment was 25 minutes, including review of chart and various tests results, discussions about plan of care and coordination of care plan     Onita Mattock, MD 04/21/2024

## 2024-04-23 LAB — CHROMOGRANIN A: Chromogranin A (ng/mL): 65.4 ng/mL (ref 0.0–101.8)

## 2024-05-04 ENCOUNTER — Encounter (HOSPITAL_COMMUNITY)
Admission: RE | Admit: 2024-05-04 | Discharge: 2024-05-04 | Disposition: A | Discharge status: Home / Self Care | Source: Ambulatory Visit | Attending: Urology | Admitting: Urology

## 2024-05-04 DIAGNOSIS — C775 Secondary and unspecified malignant neoplasm of intrapelvic lymph nodes: Secondary | ICD-10-CM | POA: Diagnosis present

## 2024-05-04 DIAGNOSIS — C61 Malignant neoplasm of prostate: Secondary | ICD-10-CM | POA: Diagnosis present

## 2024-05-04 MED ORDER — FLOTUFOLASTAT F 18 GALLIUM 296-5846 MBQ/ML IV SOLN
8.7780 | Freq: Once | INTRAVENOUS | Status: AC
  Administered 2024-05-04: 8.778 via INTRAVENOUS

## 2024-05-19 ENCOUNTER — Inpatient Hospital Stay

## 2024-05-19 ENCOUNTER — Inpatient Hospital Stay: Attending: Nurse Practitioner

## 2024-05-19 VITALS — BP 158/81 | HR 71 | Temp 98.7°F | Resp 16

## 2024-05-19 DIAGNOSIS — C7B8 Other secondary neuroendocrine tumors: Secondary | ICD-10-CM

## 2024-05-19 DIAGNOSIS — C7B02 Secondary carcinoid tumors of liver: Secondary | ICD-10-CM | POA: Insufficient documentation

## 2024-05-19 DIAGNOSIS — C61 Malignant neoplasm of prostate: Secondary | ICD-10-CM

## 2024-05-19 DIAGNOSIS — C7A00 Malignant carcinoid tumor of unspecified site: Secondary | ICD-10-CM | POA: Insufficient documentation

## 2024-05-19 DIAGNOSIS — Z7189 Other specified counseling: Secondary | ICD-10-CM

## 2024-05-19 LAB — CBC WITH DIFFERENTIAL (CANCER CENTER ONLY)
Abs Immature Granulocytes: 0.02 K/uL (ref 0.00–0.07)
Basophils Absolute: 0 K/uL (ref 0.0–0.1)
Basophils Relative: 1 %
Eosinophils Absolute: 0.4 K/uL (ref 0.0–0.5)
Eosinophils Relative: 7 %
HCT: 36.4 % — ABNORMAL LOW (ref 39.0–52.0)
Hemoglobin: 11.7 g/dL — ABNORMAL LOW (ref 13.0–17.0)
Immature Granulocytes: 0 %
Lymphocytes Relative: 25 %
Lymphs Abs: 1.4 K/uL (ref 0.7–4.0)
MCH: 29.5 pg (ref 26.0–34.0)
MCHC: 32.1 g/dL (ref 30.0–36.0)
MCV: 91.7 fL (ref 80.0–100.0)
Monocytes Absolute: 0.5 K/uL (ref 0.1–1.0)
Monocytes Relative: 10 %
Neutro Abs: 3.1 K/uL (ref 1.7–7.7)
Neutrophils Relative %: 57 %
Platelet Count: 210 K/uL (ref 150–400)
RBC: 3.97 MIL/uL — ABNORMAL LOW (ref 4.22–5.81)
RDW: 11.7 % (ref 11.5–15.5)
WBC Count: 5.5 K/uL (ref 4.0–10.5)
nRBC: 0 % (ref 0.0–0.2)

## 2024-05-19 LAB — CMP (CANCER CENTER ONLY)
ALT: 18 U/L (ref 0–44)
AST: 23 U/L (ref 15–41)
Albumin: 4 g/dL (ref 3.5–5.0)
Alkaline Phosphatase: 68 U/L (ref 38–126)
Anion gap: 3 — ABNORMAL LOW (ref 5–15)
BUN: 13 mg/dL (ref 8–23)
CO2: 31 mmol/L (ref 22–32)
Calcium: 8.9 mg/dL (ref 8.9–10.3)
Chloride: 106 mmol/L (ref 98–111)
Creatinine: 1.2 mg/dL (ref 0.61–1.24)
GFR, Estimated: >60 mL/min (ref >60)
Glucose, Bld: 96 mg/dL (ref 70–99)
Potassium: 4.5 mmol/L (ref 3.5–5.1)
Sodium: 140 mmol/L (ref 135–145)
Total Bilirubin: 0.4 mg/dL (ref 0.0–1.2)
Total Protein: 6.8 g/dL (ref 6.5–8.1)

## 2024-05-19 MED ORDER — LANREOTIDE ACETATE 120 MG/0.5ML ~~LOC~~ SOLN
120.0000 mg | Freq: Once | SUBCUTANEOUS | Status: AC
  Administered 2024-05-19: 120 mg via SUBCUTANEOUS
  Filled 2024-05-19: qty 120

## 2024-05-21 LAB — CHROMOGRANIN A: Chromogranin A (ng/mL): 89.9 ng/mL (ref 0.0–101.8)

## 2024-06-04 DIAGNOSIS — I10 Essential (primary) hypertension: Secondary | ICD-10-CM | POA: Diagnosis not present

## 2024-06-16 ENCOUNTER — Inpatient Hospital Stay: Attending: Nurse Practitioner

## 2024-06-16 ENCOUNTER — Inpatient Hospital Stay

## 2024-06-16 VITALS — BP 139/78 | HR 81 | Temp 98.7°F | Resp 15

## 2024-06-16 DIAGNOSIS — C7A00 Malignant carcinoid tumor of unspecified site: Secondary | ICD-10-CM | POA: Diagnosis not present

## 2024-06-16 DIAGNOSIS — C7B8 Other secondary neuroendocrine tumors: Secondary | ICD-10-CM

## 2024-06-16 DIAGNOSIS — C7B02 Secondary carcinoid tumors of liver: Secondary | ICD-10-CM | POA: Insufficient documentation

## 2024-06-16 DIAGNOSIS — D649 Anemia, unspecified: Secondary | ICD-10-CM | POA: Insufficient documentation

## 2024-06-16 DIAGNOSIS — C7B01 Secondary carcinoid tumors of distant lymph nodes: Secondary | ICD-10-CM | POA: Diagnosis not present

## 2024-06-16 DIAGNOSIS — Z7189 Other specified counseling: Secondary | ICD-10-CM

## 2024-06-16 DIAGNOSIS — C61 Malignant neoplasm of prostate: Secondary | ICD-10-CM

## 2024-06-16 LAB — CBC WITH DIFFERENTIAL (CANCER CENTER ONLY)
Abs Immature Granulocytes: 0.01 K/uL (ref 0.00–0.07)
Basophils Absolute: 0 K/uL (ref 0.0–0.1)
Basophils Relative: 1 %
Eosinophils Absolute: 0.4 K/uL (ref 0.0–0.5)
Eosinophils Relative: 6 %
HCT: 37.5 % — ABNORMAL LOW (ref 39.0–52.0)
Hemoglobin: 11.9 g/dL — ABNORMAL LOW (ref 13.0–17.0)
Immature Granulocytes: 0 %
Lymphocytes Relative: 26 %
Lymphs Abs: 1.7 K/uL (ref 0.7–4.0)
MCH: 29.5 pg (ref 26.0–34.0)
MCHC: 31.7 g/dL (ref 30.0–36.0)
MCV: 92.8 fL (ref 80.0–100.0)
Monocytes Absolute: 0.6 K/uL (ref 0.1–1.0)
Monocytes Relative: 9 %
Neutro Abs: 3.7 K/uL (ref 1.7–7.7)
Neutrophils Relative %: 58 %
Platelet Count: 225 K/uL (ref 150–400)
RBC: 4.04 MIL/uL — ABNORMAL LOW (ref 4.22–5.81)
RDW: 11.9 % (ref 11.5–15.5)
WBC Count: 6.4 K/uL (ref 4.0–10.5)
nRBC: 0 % (ref 0.0–0.2)

## 2024-06-16 LAB — CMP (CANCER CENTER ONLY)
ALT: 20 U/L (ref 0–44)
AST: 26 U/L (ref 15–41)
Albumin: 3.9 g/dL (ref 3.5–5.0)
Alkaline Phosphatase: 68 U/L (ref 38–126)
Anion gap: 3 — ABNORMAL LOW (ref 5–15)
BUN: 14 mg/dL (ref 8–23)
CO2: 31 mmol/L (ref 22–32)
Calcium: 9.2 mg/dL (ref 8.9–10.3)
Chloride: 105 mmol/L (ref 98–111)
Creatinine: 1.28 mg/dL — ABNORMAL HIGH (ref 0.61–1.24)
GFR, Estimated: 58 mL/min — ABNORMAL LOW (ref >60)
Glucose, Bld: 102 mg/dL — ABNORMAL HIGH (ref 70–99)
Potassium: 4.4 mmol/L (ref 3.5–5.1)
Sodium: 139 mmol/L (ref 135–145)
Total Bilirubin: 0.5 mg/dL (ref 0.0–1.2)
Total Protein: 6.7 g/dL (ref 6.5–8.1)

## 2024-06-16 MED ORDER — LANREOTIDE ACETATE 120 MG/0.5ML ~~LOC~~ SOLN
120.0000 mg | Freq: Once | SUBCUTANEOUS | Status: AC
  Administered 2024-06-16: 120 mg via SUBCUTANEOUS
  Filled 2024-06-16: qty 120

## 2024-06-17 LAB — CHROMOGRANIN A: Chromogranin A (ng/mL): 94.9 ng/mL (ref 0.0–101.8)

## 2024-06-23 DIAGNOSIS — C61 Malignant neoplasm of prostate: Secondary | ICD-10-CM | POA: Diagnosis not present

## 2024-06-30 DIAGNOSIS — R3915 Urgency of urination: Secondary | ICD-10-CM | POA: Diagnosis not present

## 2024-06-30 DIAGNOSIS — N62 Hypertrophy of breast: Secondary | ICD-10-CM | POA: Diagnosis not present

## 2024-06-30 DIAGNOSIS — C61 Malignant neoplasm of prostate: Secondary | ICD-10-CM | POA: Diagnosis not present

## 2024-07-01 DIAGNOSIS — I10 Essential (primary) hypertension: Secondary | ICD-10-CM | POA: Diagnosis not present

## 2024-07-01 DIAGNOSIS — E7800 Pure hypercholesterolemia, unspecified: Secondary | ICD-10-CM | POA: Diagnosis not present

## 2024-07-01 DIAGNOSIS — C61 Malignant neoplasm of prostate: Secondary | ICD-10-CM | POA: Diagnosis not present

## 2024-07-04 DIAGNOSIS — I10 Essential (primary) hypertension: Secondary | ICD-10-CM | POA: Diagnosis not present

## 2024-07-05 DIAGNOSIS — C61 Malignant neoplasm of prostate: Secondary | ICD-10-CM | POA: Diagnosis not present

## 2024-07-07 DIAGNOSIS — L300 Nummular dermatitis: Secondary | ICD-10-CM | POA: Diagnosis not present

## 2024-07-07 DIAGNOSIS — D2272 Melanocytic nevi of left lower limb, including hip: Secondary | ICD-10-CM | POA: Diagnosis not present

## 2024-07-07 DIAGNOSIS — L821 Other seborrheic keratosis: Secondary | ICD-10-CM | POA: Diagnosis not present

## 2024-07-07 DIAGNOSIS — D485 Neoplasm of uncertain behavior of skin: Secondary | ICD-10-CM | POA: Diagnosis not present

## 2024-07-14 ENCOUNTER — Inpatient Hospital Stay: Attending: Nurse Practitioner | Admitting: Hematology

## 2024-07-14 ENCOUNTER — Inpatient Hospital Stay

## 2024-07-14 VITALS — BP 140/94 | HR 88 | Temp 98.0°F | Resp 18 | Wt 156.9 lb

## 2024-07-14 DIAGNOSIS — C61 Malignant neoplasm of prostate: Secondary | ICD-10-CM | POA: Insufficient documentation

## 2024-07-14 DIAGNOSIS — C7B8 Other secondary neuroendocrine tumors: Secondary | ICD-10-CM | POA: Diagnosis not present

## 2024-07-14 DIAGNOSIS — C7B02 Secondary carcinoid tumors of liver: Secondary | ICD-10-CM | POA: Insufficient documentation

## 2024-07-14 DIAGNOSIS — Z7189 Other specified counseling: Secondary | ICD-10-CM

## 2024-07-14 DIAGNOSIS — C7A00 Malignant carcinoid tumor of unspecified site: Secondary | ICD-10-CM | POA: Diagnosis present

## 2024-07-14 LAB — CMP (CANCER CENTER ONLY)
ALT: 9 U/L (ref 0–44)
AST: 19 U/L (ref 15–41)
Albumin: 4 g/dL (ref 3.5–5.0)
Alkaline Phosphatase: 65 U/L (ref 38–126)
Anion gap: 5 (ref 5–15)
BUN: 13 mg/dL (ref 8–23)
CO2: 30 mmol/L (ref 22–32)
Calcium: 8.9 mg/dL (ref 8.9–10.3)
Chloride: 104 mmol/L (ref 98–111)
Creatinine: 1.21 mg/dL (ref 0.61–1.24)
GFR, Estimated: >60 mL/min (ref >60)
Glucose, Bld: 87 mg/dL (ref 70–99)
Potassium: 4.3 mmol/L (ref 3.5–5.1)
Sodium: 139 mmol/L (ref 135–145)
Total Bilirubin: 0.5 mg/dL (ref 0.0–1.2)
Total Protein: 6.9 g/dL (ref 6.5–8.1)

## 2024-07-14 LAB — CBC WITH DIFFERENTIAL (CANCER CENTER ONLY)
Abs Immature Granulocytes: 0.01 K/uL (ref 0.00–0.07)
Basophils Absolute: 0.1 K/uL (ref 0.0–0.1)
Basophils Relative: 1 %
Eosinophils Absolute: 0.4 K/uL (ref 0.0–0.5)
Eosinophils Relative: 6 %
HCT: 37.5 % — ABNORMAL LOW (ref 39.0–52.0)
Hemoglobin: 12.3 g/dL — ABNORMAL LOW (ref 13.0–17.0)
Immature Granulocytes: 0 %
Lymphocytes Relative: 25 %
Lymphs Abs: 1.5 K/uL (ref 0.7–4.0)
MCH: 30 pg (ref 26.0–34.0)
MCHC: 32.8 g/dL (ref 30.0–36.0)
MCV: 91.5 fL (ref 80.0–100.0)
Monocytes Absolute: 0.5 K/uL (ref 0.1–1.0)
Monocytes Relative: 9 %
Neutro Abs: 3.6 K/uL (ref 1.7–7.7)
Neutrophils Relative %: 59 %
Platelet Count: 218 K/uL (ref 150–400)
RBC: 4.1 MIL/uL — ABNORMAL LOW (ref 4.22–5.81)
RDW: 11.8 % (ref 11.5–15.5)
WBC Count: 6 K/uL (ref 4.0–10.5)
nRBC: 0 % (ref 0.0–0.2)

## 2024-07-14 MED ORDER — LANREOTIDE ACETATE 120 MG/0.5ML ~~LOC~~ SOLN
120.0000 mg | Freq: Once | SUBCUTANEOUS | Status: AC
  Administered 2024-07-14: 120 mg via SUBCUTANEOUS
  Filled 2024-07-14: qty 0.5

## 2024-07-14 NOTE — Assessment & Plan Note (Signed)
 Most consistent with GI primary with liver and nodal metastasis -Due to rising PSA, CT/bone scan were obtained showing a mesenteric mass and single liver lesion. DOTATATE PET on 04/09/20 showed intense uptake in the central mesenteric mass with 3 liver metastases and small peritoneal/nodal metastatic deposits along the left iliac vessels. There is no primary bowel lesion identified.   -His 04/30/20 liver biopsy showed metastatic well differentiated/G1 neuroendocrine tumor to the liver. --He began first line Sandostatin  20 mg on 05/10/20. However on 05/12/20 he had pericarditis and treatment was held. I changed him to Lanreotide 120mg  every 4 weeks starting 10/18/20  -he is clinically stable, tolerating treatment well, last dotatate PET scan in December 2023 showed stable primary tumor in small bowel, stable metastatic lesions in peritoneum and liver  -CT 03/29/2024 showed overall stable disease

## 2024-07-15 LAB — CHROMOGRANIN A: Chromogranin A (ng/mL): 74.6 ng/mL (ref 0.0–101.8)

## 2024-07-16 ENCOUNTER — Encounter: Payer: Self-pay | Admitting: Hematology

## 2024-07-16 NOTE — Progress Notes (Signed)
 [facility]   [phone] [phone]) [phone]   Clinic Follow up Note   Patient Care Team: Leonel Cole, MD as PCP - General (Family Medicine) Verlin Lonni BIRCH, MD as PCP - Cardiology (Cardiology) Lanny Callander, MD as Consulting Physician (Oncology) Ann Mayme POUR, NP as Nurse Practitioner (Nurse Practitioner) Alvaro Ricardo KATHEE Raddle., MD as Consulting Physician (Urology) Patrcia Cough, MD as Consulting Physician (Radiation Oncology) Imaging, [facility] as Consulting Physician (Diagnostic Radiology)  Date of Service:  07/14/2024  CHIEF COMPLAINT: f/u of metastatic neuroendocrine tumor  CURRENT THERAPY:  Lanreotide injection every 4 weeks  Oncology History   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 DOTATATE PET on 04/09/20 showed intense uptake in the central mesenteric mass with 3 liver metastases and small peritoneal/nodal metastatic deposits along the left iliac vessels. There is no primary bowel lesion identified.   -His 04/30/20 liver biopsy showed metastatic well differentiated/G1 neuroendocrine tumor to the liver. --He began first line Sandostatin  20 mg on 05/10/20. However on 05/12/20 he had pericarditis and treatment was held. 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 Abdominal lymph  nodes remain stable. Blood counts, kidney, and liver function tests are stable. Prostate cancer management is ongoing with a new medication regimen. - Will order DOTA PET scan in three months to assess neuroendocrine tumor status. - Continue current management and monitoring of blood counts, kidney, and liver function.  Constipation Chronic constipation with bowel movements approximately twice a week. Current regimen includes Miralax  twice daily, which is less effective than initially. Prune juice provides some relief. Discussed alternative options including increasing Miralax  to three times daily, using Senna-S, or magnesium  citrate for stronger relief. Magnesium  citrate may cause significant diarrhea and should be used with caution. - Increase Miralax  to three times daily if needed. - Consider Senna-S as an alternative laxative. - Use magnesium  citrate occasionally for stronger relief, with caution due to potential for diarrhea.   Plan - Patient clinically doing well, labs reviewed, will proceed lanreotide injection today and continue every 4 weeks - Lab and follow-up in 3 months with restaging PET scan 1 week before  SUMMARY OF ONCOLOGIC HISTORY: Oncology History Overview Note  Cancer Staging No matching staging information was found for the patient.    Malignant neoplasm of prostate (HCC)  08/31/2017 Initial Diagnosis   Malignant neoplasm of prostate (HCC)   10/28/2017 Genetic Testing   The patient had genetic testing due to a personal history of prostate cancer and a family history of breast and stomach cancer.  The Common Hereditary Cancers Panel + Prostate Cancer Panel was ordered.  APC, ATM, AXIN2, BARD1, BMPR1A, BRCA1, BRCA2, BRIP1, CDH1, CDK4, CDKN2A (p14ARF), CDKN2A (p16INK4a), CHEK2, CTNNA1, DICER1, EPCAM*, FANCA, GREM1*, KIT, MEN1, MLH1, MSH2, MSH3, MSH6, MUTYH, NBN, NF1, PALB2, PDGFRA, PMS2, POLD1, POLE, PTEN, RAD50, RAD51C, RAD51D, SDHB, SDHC, SDHD, SMAD4, SMARCA4, STK11,  TP53, TSC1, TSC2, VHL. The following  genes were evaluated for sequence changes only: HOXB13*, NTHL1*, SDHA.   Results: Negative, no pathogenic variants identified. The date of this test report is 10/28/2017.    01/05/2018 - 02/12/2018 Radiation Therapy   S/p radiation to the prostate 70 Gy in 28 fractions from 01/05/2018-02/12/2018 per Dr. Patrcia   01/31/2021 Imaging   CT A/P  IMPRESSION: 1. Interval decrease in size of hypodense lesions of the inferior right lobe of the liver and posterior liver dome findings are consistent with treatment response of metastatic lesions. 2. No evidence of new metastatic disease in the abdomen or pelvis. 3. Prostatomegaly.   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  Upper normal 9 mm short axis lymph node in the adjacent mesentery 2.  1.6 x 1.4 cm heterogeneous, poorly defined lesion in the inferior right liver suspicious for metastatic disease, the differential includes GI primary or metastatic prostate cancer No retroperitoneal or pelvic sidewall lymphadenopathy   04/01/2020 PET scan   IMPRESSION: 1. Central mesenteric mass with intense radiotracer activity consistent well differentiated neuroendocrine tumor 2. 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 3. Small peritoneal/nodal metastasis along the LEFT iliac vessels. 4. Very small lesion in the pancreas with intermediate activity could represent primary lesion. No primary bowel lesion identified. 5. Small metastatic implant within the pericardium adjacent LEFT heart ventricle. 6. No skeletal metastasis.   04/30/2020 Pathology Results   FINAL MICROSCOPIC DIAGNOSIS:  A. LIVER, BIOPSY:  - Metastatic  well-differentiated (G1) neuroendocrine tumor to the liver COMMENT:  Immunohistochemical stains show that the tumor cells are positive for  synaptophysin, chromogranin, CD56 and CDX2.  Tumor cells are negative  for TTF-1.  The findings are consistent with neuroendocrine tumor of  gastrointestinal origin.  Ki-67 stain shows a proliferative index of  about 1%, consistent with well-differentiated, grade 1 tumor.    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 Held after cycle 1 due to suspected pericarditis and deconditioning.           --Changed to monthly Lanreotide on 10/18/20.     01/31/2021 Imaging   CT A/P  IMPRESSION: 1. Interval decrease in size of hypodense lesions of the inferior right lobe of the liver and posterior liver dome findings are consistent with treatment response of metastatic lesions. 2. No evidence of new metastatic disease in the abdomen or pelvis. 3. Prostatomegaly.   07/29/2021 Imaging   EXAM: CT CHEST, ABDOMEN, AND PELVIS WITH CONTRAST  IMPRESSION: 1. Slight interval increase in size of the 2 liver metastases. No new liver lesions evident. 2. Stable mild lymphadenopathy in the central small bowel mesentery. 3. 3 mm right upper lobe pulmonary nodule. Attention on follow-up recommended. 4. 4 cm ascending thoracic aortic aneurysm, increased from 3.5 cm on 05/12/2020. Recommend annual imaging followup by CTA or MRA. This recommendation follows 2010 ACCF/AHA/AATS/ACR/ASA/SCA/SCAI/SIR/STS/SVM Guidelines for the Diagnosis and Management of Patients with Thoracic Aortic Disease. Circulation. 2010; 121: Z733-z630. Aortic aneurysm NOS (ICD10-I71.9)  5. Aortic Atherosclerosis (ICD10-I70.0).      Discussed the use of AI scribe software for clinical note transcription with the patient, who gave verbal consent to proceed.  History of  Present Illness Ryan Wilkerson is a 78 year old male with a neuroendocrine tumor who presents for follow-up.  He experiences intermittent abdominal pain, which is alleviated by eating smaller, more frequent meals and exacerbated by larger meals. Constipation occurs with bowel movements about twice a week. He uses Miralax  twice daily with decreasing effectiveness and finds some relief with prune juice.  A scan in July 2025 showed two liver lesions, the largest measuring 1.7 cm, slightly increased from 1.6 cm. Metastatic lymph nodes in the abdomen remain stable.  For prostate cancer, he recently resumed a medication at 40 mg daily, starting four days ago, with no drastic changes in his condition over the past few months.     All other systems were reviewed with the patient and are negative.  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 [facility])    Nocturia    Pericardial effusion    Pericarditis    Prostate cancer [facility]) urologist-- dr alvaro /  oncologist-- dr manning   prostate bx 06-24-2016 and 09-15-2017 at dr alvaro office  Stage T1c, Gleason 4+3, PSA 8.48, vol 33cc---  planned external beam radiation therpay   Tobacco abuse    Urgency of urination    Wears glasses     SURGICAL HISTORY: Past Surgical History:  Procedure Laterality Date   COLONOSCOPY  last one 06/ 2018   GOLD SEED IMPLANT N/A 12/23/2017   Procedure: GOLD SEED IMPLANT;  Surgeon: Alvaro Hummer, MD;  Location: [facility];  Service: Urology;  Laterality: N/A;   LEFT HEART CATH AND CORONARY  ANGIOGRAPHY N/A 05/12/2020   Procedure: LEFT HEART CATH AND CORONARY ANGIOGRAPHY;  Surgeon: Verlin Lonni BIRCH, MD;  Location: [facility] INVASIVE CV LAB;  Service: Cardiovascular;  Laterality: N/A;   PROSTATE BIOPSY  06-24-2016;  09-15-2017-- at dr alvaro office   SPACE OAR INSTILLATION N/A 12/23/2017   Procedure: SPACE OAR INSTILLATION;  Surgeon: Alvaro Hummer, MD;  Location: [facility];  Service: Urology;  Laterality: N/A;    I have reviewed the social history and family history with the patient and they are unchanged from previous note.  ALLERGIES:  is allergic to aspirin , primidone, and simvastatin.  MEDICATIONS:  Current Outpatient Medications  Medication Sig Dispense Refill   acetaminophen  (TYLENOL ) 500 MG tablet Take 1,000 mg by mouth every 6 (six) hours as needed for headache (pain).      clopidogrel  (PLAVIX ) 75 MG tablet Take 75 mg by mouth daily.      dicyclomine  (BENTYL ) 10 MG capsule Take 1 capsule (10 mg total) by mouth every 8 (eight) hours as needed for spasms. 30 capsule 1   fluticasone  (FLONASE ) 50 MCG/ACT nasal spray Place 2 sprays into both nostrils daily. 16 g 0   loratadine  (CLARITIN ) 10 MG tablet Take 10 mg by mouth every morning.     mirtazapine  (REMERON  SOL-TAB) 15 MG disintegrating tablet Take 15 mg by mouth at bedtime.     ondansetron  (ZOFRAN ) 4 MG tablet Take 1 tablet (4 mg total) by mouth  every 8 (eight) hours as needed for nausea or vomiting. 30 tablet 1   OVER THE COUNTER MEDICATION Place 1 drop into both eyes daily as needed (dry eyes). Over the counter lubricating eye drop      pantoprazole  (PROTONIX ) 40 MG tablet Take 1 tablet (40 mg total) by mouth daily. 30 tablet 0   polyethylene glycol (MIRALAX  / GLYCOLAX ) 17 g packet Take 17 g by mouth 2 (two) times daily. 14 each 0   SYMBICORT 160-4.5 MCG/ACT inhaler Inhale 1-2 puffs into the lungs as needed.     tamsulosin  (FLOMAX ) 0.4 MG CAPS capsule Take 0.4 mg by mouth.     traMADol  (ULTRAM ) 50 MG  tablet Take 1-2 tablets (50-100 mg total) by mouth every 6 (six) hours as needed for moderate pain or severe pain. 15 tablet 0   XTANDI 40 MG tablet Take 80 mg by mouth 2 (two) times daily.     No current facility-administered medications for this visit.    PHYSICAL EXAMINATION: ECOG PERFORMANCE STATUS: 1 - Symptomatic but completely ambulatory  Vitals:   07/14/24 1331 07/14/24 1335  BP: (!) 136/92 (!) 140/94  Pulse: 88   Resp: 18   Temp: 98 F (36.7 C)   SpO2: 94%    Wt Readings from Last 3 Encounters:  07/14/24 156 lb 14.4 oz (71.2 kg)  04/21/24 155 lb 12.8 oz (70.7 kg)  01/28/24 155 lb 8 oz (70.5 kg)     GENERAL:alert, no distress and comfortable SKIN: skin color, texture, turgor are normal, no rashes or significant lesions EYES: normal, Conjunctiva are pink and non-injected, sclera clear NECK: supple, thyroid  normal size, non-tender, without nodularity LYMPH:  no palpable lymphadenopathy in the cervical, axillary  LUNGS: clear to auscultation and percussion with normal breathing effort HEART: regular rate & rhythm and no murmurs and no lower extremity edema ABDOMEN:abdomen soft, non-tender and normal bowel sounds Musculoskeletal:no cyanosis of digits and no clubbing  NEURO: alert & oriented x 3 with fluent speech, no focal motor/sensory deficits  Physical Exam    LABORATORY DATA:  I have reviewed the data as listed    Latest Ref Rng & Units 07/14/2024    1:03 PM 06/16/2024    1:10 PM 05/19/2024    1:00 PM  CBC  WBC 4.0 - 10.5 K/uL 6.0  6.4  5.5   Hemoglobin 13.0 - 17.0 g/dL 87.6  88.0  88.2   Hematocrit 39.0 - 52.0 % 37.5  37.5  36.4   Platelets 150 - 400 K/uL 218  225  210         Latest Ref Rng & Units 07/14/2024    1:03 PM 06/16/2024    1:10 PM 05/19/2024    1:00 PM  CMP  Glucose 70 - 99 mg/dL 87  897  96   BUN 8 - 23 mg/dL 13  14  13    Creatinine 0.61 - 1.24 mg/dL 8.78  8.71  8.79   Sodium 135 - 145 mmol/L 139  139  140   Potassium 3.5 - 5.1  mmol/L 4.3  4.4  4.5   Chloride 98 - 111 mmol/L 104  105  106   CO2 22 - 32 mmol/L 30  31  31    Calcium 8.9 - 10.3 mg/dL 8.9  9.2  8.9   Total Protein 6.5 - 8.1 g/dL 6.9  6.7  6.8   Total Bilirubin 0.0 - 1.2 mg/dL 0.5  0.5  0.4   Alkaline Phos 38 - 126 U/L  65  68  68   AST 15 - 41 U/L 19  26  23    ALT 0 - 44 U/L 9  20  18        RADIOGRAPHIC STUDIES: I have personally reviewed the radiological images as listed and agreed with the findings in the report. No results found.    Orders Placed This Encounter  Procedures   NM PET DOTATATE SKULL BASE TO MID THIGH    Standing Status:   Future    Expected Date:   09/29/2024    Expiration Date:   07/14/2025    If indicated for the ordered procedure, I authorize the administration of a radiopharmaceutical per Radiology protocol:   Yes    Preferred imaging location?:   [facility]   All questions were answered. The patient knows to call the clinic with any problems, questions or concerns. No barriers to learning was detected. The total time spent in the appointment was 25 minutes, including review of chart and various tests results, discussions about plan of care and coordination of care plan     Onita Mattock, MD 07/14/2024

## 2024-07-31 DIAGNOSIS — C61 Malignant neoplasm of prostate: Secondary | ICD-10-CM | POA: Diagnosis not present

## 2024-07-31 DIAGNOSIS — E7800 Pure hypercholesterolemia, unspecified: Secondary | ICD-10-CM | POA: Diagnosis not present

## 2024-07-31 DIAGNOSIS — I10 Essential (primary) hypertension: Secondary | ICD-10-CM | POA: Diagnosis not present

## 2024-08-11 ENCOUNTER — Inpatient Hospital Stay

## 2024-08-11 ENCOUNTER — Inpatient Hospital Stay: Attending: Nurse Practitioner

## 2024-08-11 VITALS — BP 127/76 | HR 99 | Resp 18

## 2024-08-11 DIAGNOSIS — C7B02 Secondary carcinoid tumors of liver: Secondary | ICD-10-CM | POA: Diagnosis present

## 2024-08-11 DIAGNOSIS — C7A00 Malignant carcinoid tumor of unspecified site: Secondary | ICD-10-CM | POA: Diagnosis present

## 2024-08-11 DIAGNOSIS — Z7189 Other specified counseling: Secondary | ICD-10-CM

## 2024-08-11 DIAGNOSIS — C7B8 Other secondary neuroendocrine tumors: Secondary | ICD-10-CM

## 2024-08-11 DIAGNOSIS — C61 Malignant neoplasm of prostate: Secondary | ICD-10-CM

## 2024-08-11 LAB — CBC WITH DIFFERENTIAL (CANCER CENTER ONLY)
Abs Immature Granulocytes: 0.02 K/uL (ref 0.00–0.07)
Basophils Absolute: 0.1 K/uL (ref 0.0–0.1)
Basophils Relative: 1 %
Eosinophils Absolute: 0.6 K/uL — ABNORMAL HIGH (ref 0.0–0.5)
Eosinophils Relative: 8 %
HCT: 37.5 % — ABNORMAL LOW (ref 39.0–52.0)
Hemoglobin: 12.2 g/dL — ABNORMAL LOW (ref 13.0–17.0)
Immature Granulocytes: 0 %
Lymphocytes Relative: 27 %
Lymphs Abs: 1.8 K/uL (ref 0.7–4.0)
MCH: 29.7 pg (ref 26.0–34.0)
MCHC: 32.5 g/dL (ref 30.0–36.0)
MCV: 91.2 fL (ref 80.0–100.0)
Monocytes Absolute: 0.6 K/uL (ref 0.1–1.0)
Monocytes Relative: 9 %
Neutro Abs: 3.6 K/uL (ref 1.7–7.7)
Neutrophils Relative %: 55 %
Platelet Count: 218 K/uL (ref 150–400)
RBC: 4.11 MIL/uL — ABNORMAL LOW (ref 4.22–5.81)
RDW: 11.6 % (ref 11.5–15.5)
WBC Count: 6.7 K/uL (ref 4.0–10.5)
nRBC: 0 % (ref 0.0–0.2)

## 2024-08-11 LAB — CMP (CANCER CENTER ONLY)
ALT: 14 U/L (ref 0–44)
AST: 24 U/L (ref 15–41)
Albumin: 4 g/dL (ref 3.5–5.0)
Alkaline Phosphatase: 67 U/L (ref 38–126)
Anion gap: 9 (ref 5–15)
BUN: 12 mg/dL (ref 8–23)
CO2: 27 mmol/L (ref 22–32)
Calcium: 9.1 mg/dL (ref 8.9–10.3)
Chloride: 102 mmol/L (ref 98–111)
Creatinine: 1.31 mg/dL — ABNORMAL HIGH (ref 0.61–1.24)
GFR, Estimated: 56 mL/min — ABNORMAL LOW (ref >60)
Glucose, Bld: 120 mg/dL — ABNORMAL HIGH (ref 70–99)
Potassium: 4.5 mmol/L (ref 3.5–5.1)
Sodium: 138 mmol/L (ref 135–145)
Total Bilirubin: 0.4 mg/dL (ref 0.0–1.2)
Total Protein: 6.9 g/dL (ref 6.5–8.1)

## 2024-08-11 MED ORDER — LANREOTIDE ACETATE 120 MG/0.5ML ~~LOC~~ SOLN
120.0000 mg | Freq: Once | SUBCUTANEOUS | Status: AC
  Administered 2024-08-11: 120 mg via SUBCUTANEOUS
  Filled 2024-08-11: qty 0.5

## 2024-08-12 LAB — CHROMOGRANIN A: Chromogranin A (ng/mL): 85.6 ng/mL (ref 0.0–101.8)

## 2024-09-01 ENCOUNTER — Encounter: Payer: Self-pay | Admitting: Hematology

## 2024-09-08 ENCOUNTER — Other Ambulatory Visit: Payer: Self-pay

## 2024-09-08 ENCOUNTER — Inpatient Hospital Stay

## 2024-09-08 ENCOUNTER — Telehealth: Payer: Self-pay

## 2024-09-08 NOTE — Telephone Encounter (Signed)
 Patient did not arrive to his appts today.  Contacted patient via telephone call. Patient forgot about his appts. Transferred patient's call to scheduler.

## 2024-09-09 ENCOUNTER — Inpatient Hospital Stay: Attending: Nurse Practitioner

## 2024-09-09 ENCOUNTER — Inpatient Hospital Stay

## 2024-09-09 ENCOUNTER — Encounter: Payer: Self-pay | Admitting: Hematology

## 2024-09-09 VITALS — BP 155/92 | HR 83 | Temp 99.3°F | Resp 18

## 2024-09-09 DIAGNOSIS — C7B02 Secondary carcinoid tumors of liver: Secondary | ICD-10-CM | POA: Insufficient documentation

## 2024-09-09 DIAGNOSIS — C7B8 Other secondary neuroendocrine tumors: Secondary | ICD-10-CM

## 2024-09-09 DIAGNOSIS — C7A00 Malignant carcinoid tumor of unspecified site: Secondary | ICD-10-CM | POA: Diagnosis present

## 2024-09-09 DIAGNOSIS — C61 Malignant neoplasm of prostate: Secondary | ICD-10-CM

## 2024-09-09 DIAGNOSIS — Z7189 Other specified counseling: Secondary | ICD-10-CM

## 2024-09-09 LAB — CMP (CANCER CENTER ONLY)
ALT: 11 U/L (ref 0–44)
AST: 26 U/L (ref 15–41)
Albumin: 3.7 g/dL (ref 3.5–5.0)
Alkaline Phosphatase: 66 U/L (ref 38–126)
Anion gap: 10 (ref 5–15)
BUN: 7 mg/dL — ABNORMAL LOW (ref 8–23)
CO2: 25 mmol/L (ref 22–32)
Calcium: 8.7 mg/dL — ABNORMAL LOW (ref 8.9–10.3)
Chloride: 104 mmol/L (ref 98–111)
Creatinine: 1.03 mg/dL (ref 0.61–1.24)
GFR, Estimated: >60 mL/min
Glucose, Bld: 137 mg/dL — ABNORMAL HIGH (ref 70–99)
Potassium: 3.9 mmol/L (ref 3.5–5.1)
Sodium: 138 mmol/L (ref 135–145)
Total Bilirubin: 0.4 mg/dL (ref 0.0–1.2)
Total Protein: 6.5 g/dL (ref 6.5–8.1)

## 2024-09-09 LAB — CBC WITH DIFFERENTIAL (CANCER CENTER ONLY)
Abs Immature Granulocytes: 0.02 K/uL (ref 0.00–0.07)
Basophils Absolute: 0 K/uL (ref 0.0–0.1)
Basophils Relative: 0 %
Eosinophils Absolute: 0.4 K/uL (ref 0.0–0.5)
Eosinophils Relative: 5 %
HCT: 33.7 % — ABNORMAL LOW (ref 39.0–52.0)
Hemoglobin: 11.4 g/dL — ABNORMAL LOW (ref 13.0–17.0)
Immature Granulocytes: 0 %
Lymphocytes Relative: 14 %
Lymphs Abs: 1.2 K/uL (ref 0.7–4.0)
MCH: 30.5 pg (ref 26.0–34.0)
MCHC: 33.8 g/dL (ref 30.0–36.0)
MCV: 90.1 fL (ref 80.0–100.0)
Monocytes Absolute: 0.8 K/uL (ref 0.1–1.0)
Monocytes Relative: 10 %
Neutro Abs: 6.2 K/uL (ref 1.7–7.7)
Neutrophils Relative %: 71 %
Platelet Count: 201 K/uL (ref 150–400)
RBC: 3.74 MIL/uL — ABNORMAL LOW (ref 4.22–5.81)
RDW: 12.2 % (ref 11.5–15.5)
WBC Count: 8.7 K/uL (ref 4.0–10.5)
nRBC: 0 % (ref 0.0–0.2)

## 2024-09-09 MED ORDER — LANREOTIDE ACETATE 120 MG/0.5ML ~~LOC~~ SOLN
120.0000 mg | Freq: Once | SUBCUTANEOUS | Status: AC
  Administered 2024-09-09: 120 mg via SUBCUTANEOUS
  Filled 2024-09-09: qty 0.5

## 2024-09-09 NOTE — Patient Instructions (Signed)
 Lanreotide Injection What is this medication? LANREOTIDE (lan REE oh tide) treats high levels of growth hormone (acromegaly). It is used when other therapies have not worked well enough or cannot be tolerated. It works by reducing the amount of growth hormone your body makes. This reduces symptoms and the risk of health problems caused by too much growth hormone, such as diabetes and heart disease. It may also be used to treat neuroendocrine tumors, a cancer of the cells that release hormones and other substances in your body. It works by slowing down the release of these substances from the cells. This slows tumor growth. It also decreases the symptoms of carcinoid syndrome, such as flushing or diarrhea. This medicine may be used for other purposes; ask your health care provider or pharmacist if you have questions. COMMON BRAND NAME(S): Somatuline [place] What should I tell my care team before I take this medication? They need to know if you have any of these conditions: Diabetes Gallbladder disease Heart disease Kidney disease Liver disease Pancreatic disease Thyroid disease An unusual or allergic reaction to lanreotide, other medications, foods, dyes, or preservatives Pregnant or trying to get pregnant Breastfeeding How should I use this medication? This medication is injected under the skin. It is given by your care team in a hospital or clinic setting. Talk to your care team about the use of this medication in children. Special care may be needed. Overdosage: If you think you have taken too much of this medicine contact a poison control center or emergency room at once. NOTE: This medicine is only for you. Do not share this medicine with others. What if I miss a dose? Keep appointments for follow-up doses. It is important not to miss your dose. Call your care team if you are unable to keep an appointment. What may interact with this medication? Bromocriptine Cyclosporine Certain  medications for blood pressure, heart disease, irregular heartbeat Certain medications for diabetes Quinidine Terfenadine This list may not describe all possible interactions. Give your health care provider a list of all the medicines, herbs, non-prescription drugs, or dietary supplements you use. Also tell them if you smoke, drink alcohol, or use illegal drugs. Some items may interact with your medicine. What should I watch for while using this medication? Visit your care team for regular checks on your progress. Tell your care team if your symptoms do not start to get better or if they get worse. Your condition will be monitored carefully while you are receiving this medication. You may need blood work while you are taking this medication. This medication may increase blood sugar. The risk may be higher in patients who already have diabetes. Ask your care team what you can do to lower your risk of diabetes while taking this medication. Talk to your care team if you wish to become pregnant or think you may be pregnant. This medication can cause serious birth defects. Do not breast-feed while taking this medication and for 6 months after stopping therapy. This medication may cause infertility. Talk to your care team if you are concerned about your fertility. What side effects may I notice from receiving this medication? Side effects that you should report to your care team as soon as possible: Allergic reactions--skin rash, itching, hives, swelling of the face, lips, tongue, or throat Gallbladder problems--severe stomach pain, nausea, vomiting, fever High blood sugar (hyperglycemia)--increased thirst or amount of urine, unusual weakness or fatigue, blurry vision Increase in blood pressure Low blood sugar (hypoglycemia)--pale, blue or purple  skin or lips, sweating, fussiness, rapid heartbeat, poor feeding, low body temperature Low thyroid levels (hypothyroidism)--unusual weakness or fatigue,  increased sensitivity to cold, constipation, hair loss, dry skin, weight gain, feelings of depression Oily or light-colored stools, diarrhea, bloating, weight loss Slow heartbeat--dizziness, feeling faint or lightheaded, confusion, trouble breathing, unusual weakness or fatigue Side effects that usually do not require medical attention (report these to your care team if they continue or are bothersome): Diarrhea Dizziness Headache Muscle spasms Nausea Pain, redness, or irritation at injection site Stomach pain This list may not describe all possible side effects. Call your doctor for medical advice about side effects. You may report side effects to FDA at 1-800-FDA-1088. Where should I keep my medication? This medication is given in a hospital or clinic. It will not be stored at home. NOTE: This sheet is a summary. It may not cover all possible information. If you have questions about this medicine, talk to your doctor, pharmacist, or health care provider.  2024 Elsevier/Gold Standard (2023-07-31 00:00:00)

## 2024-09-13 LAB — CHROMOGRANIN A: Chromogranin A (ng/mL): 70 ng/mL (ref 0.0–101.8)

## 2024-10-04 ENCOUNTER — Encounter (HOSPITAL_COMMUNITY): Admission: RE | Admit: 2024-10-04 | Source: Ambulatory Visit

## 2024-10-04 DIAGNOSIS — C7B8 Other secondary neuroendocrine tumors: Secondary | ICD-10-CM

## 2024-10-04 MED ORDER — COPPER CU 64 DOTATATE 1 MCI/ML IV SOLN
4.0000 | Freq: Once | INTRAVENOUS | Status: AC
  Administered 2024-10-04: 4.26 via INTRAVENOUS

## 2024-10-05 ENCOUNTER — Other Ambulatory Visit: Payer: Self-pay

## 2024-10-05 DIAGNOSIS — C7B8 Other secondary neuroendocrine tumors: Secondary | ICD-10-CM

## 2024-10-05 DIAGNOSIS — C61 Malignant neoplasm of prostate: Secondary | ICD-10-CM

## 2024-10-05 NOTE — Assessment & Plan Note (Signed)
 Most consistent with GI primary with liver and nodal metastasis -Due to rising PSA, CT/bone scan were obtained showing a mesenteric mass and single liver lesion. DOTATATE PET on 04/09/20 showed intense uptake in the central mesenteric mass with 3 liver metastases and small peritoneal/nodal metastatic deposits along the left iliac vessels. There is no primary bowel lesion identified.   -His 04/30/20 liver biopsy showed metastatic well differentiated/G1 neuroendocrine tumor to the liver. --He began first line Sandostatin  20 mg on 05/10/20. However on 05/12/20 he had pericarditis and treatment was held. I changed him to Lanreotide 120mg  every 4 weeks starting 10/18/20  -he is clinically stable, tolerating treatment well, last dotatate PET scan in December 2023 showed stable primary tumor in small bowel, stable metastatic lesions in peritoneum and liver  -CT 03/29/2024 and dotatate PET 10/04/2024 showed overall stable disease

## 2024-10-06 ENCOUNTER — Inpatient Hospital Stay: Attending: Nurse Practitioner | Admitting: Hematology

## 2024-10-06 ENCOUNTER — Inpatient Hospital Stay

## 2024-10-06 VITALS — BP 162/92 | HR 90 | Temp 98.7°F | Resp 19 | Wt 150.7 lb

## 2024-10-06 DIAGNOSIS — C7B8 Other secondary neuroendocrine tumors: Secondary | ICD-10-CM

## 2024-10-06 DIAGNOSIS — C61 Malignant neoplasm of prostate: Secondary | ICD-10-CM

## 2024-10-06 DIAGNOSIS — Z7189 Other specified counseling: Secondary | ICD-10-CM

## 2024-10-06 LAB — CMP (CANCER CENTER ONLY)
ALT: 9 U/L (ref 0–44)
AST: 21 U/L (ref 15–41)
Albumin: 3.9 g/dL (ref 3.5–5.0)
Alkaline Phosphatase: 78 U/L (ref 38–126)
Anion gap: 9 (ref 5–15)
BUN: 11 mg/dL (ref 8–23)
CO2: 27 mmol/L (ref 22–32)
Calcium: 9 mg/dL (ref 8.9–10.3)
Chloride: 102 mmol/L (ref 98–111)
Creatinine: 1.05 mg/dL (ref 0.61–1.24)
GFR, Estimated: >60 mL/min
Glucose, Bld: 96 mg/dL (ref 70–99)
Potassium: 4.3 mmol/L (ref 3.5–5.1)
Sodium: 139 mmol/L (ref 135–145)
Total Bilirubin: 0.3 mg/dL (ref 0.0–1.2)
Total Protein: 7 g/dL (ref 6.5–8.1)

## 2024-10-06 LAB — CBC WITH DIFFERENTIAL (CANCER CENTER ONLY)
Abs Immature Granulocytes: 0.01 10*3/uL (ref 0.00–0.07)
Basophils Absolute: 0 10*3/uL (ref 0.0–0.1)
Basophils Relative: 1 %
Eosinophils Absolute: 0.4 10*3/uL (ref 0.0–0.5)
Eosinophils Relative: 6 %
HCT: 34.8 % — ABNORMAL LOW (ref 39.0–52.0)
Hemoglobin: 11.7 g/dL — ABNORMAL LOW (ref 13.0–17.0)
Immature Granulocytes: 0 %
Lymphocytes Relative: 28 %
Lymphs Abs: 1.6 10*3/uL (ref 0.7–4.0)
MCH: 31.1 pg (ref 26.0–34.0)
MCHC: 33.6 g/dL (ref 30.0–36.0)
MCV: 92.6 fL (ref 80.0–100.0)
Monocytes Absolute: 0.6 10*3/uL (ref 0.1–1.0)
Monocytes Relative: 10 %
Neutro Abs: 3.2 10*3/uL (ref 1.7–7.7)
Neutrophils Relative %: 55 %
Platelet Count: 238 10*3/uL (ref 150–400)
RBC: 3.76 MIL/uL — ABNORMAL LOW (ref 4.22–5.81)
RDW: 11.8 % (ref 11.5–15.5)
WBC Count: 5.7 10*3/uL (ref 4.0–10.5)
nRBC: 0 % (ref 0.0–0.2)

## 2024-10-06 MED ORDER — LANREOTIDE ACETATE 120 MG/0.5ML ~~LOC~~ SOLN
120.0000 mg | Freq: Once | SUBCUTANEOUS | Status: AC
  Administered 2024-10-06: 120 mg via SUBCUTANEOUS
  Filled 2024-10-06: qty 0.5

## 2024-10-06 NOTE — Progress Notes (Signed)
 " [facility]   [phone] [phone]   Clinic Follow up Note   Patient Care Team: Leonel Cole, MD as PCP - General (Family Medicine) Verlin Lonni BIRCH, MD as PCP - Cardiology (Cardiology) Lanny Callander, MD as Consulting Physician (Oncology) Burton, [name] K, NP as Nurse Practitioner (Nurse Practitioner) Alvaro Ricardo KATHEE Raddle., MD as Consulting Physician (Urology) Patrcia Cough, MD as Consulting Physician (Radiation Oncology) Imaging, [facility] as Consulting Physician (Diagnostic Radiology)  Date of Service:  10/06/2024  CHIEF COMPLAINT: f/u of metastatic neuroendocrine tumor  CURRENT THERAPY:  Lanreotide injection every month  Oncology History   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 DOTATATE PET on 04/09/20 showed intense uptake in the central mesenteric mass with 3 liver metastases and small peritoneal/nodal metastatic deposits along the left iliac vessels. There is no primary bowel lesion identified.   -Ryan Wilkerson 04/30/20 liver biopsy showed metastatic well differentiated/G1 neuroendocrine tumor to the liver. --He began first line Sandostatin  20 mg on 05/10/20. However on 05/12/20 he had pericarditis and treatment was held. 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 He has mild  weight loss and decreased appetite, likely multifactorial, but otherwise tolerates therapy well. -PET reviewed, SD - Continued injections as scheduled. - Recommended nutritional supplementation with high-calorie, high-protein snacks between meals. - Instructed to attend injection appointment and schedule future appointments. - Scheduled clinical follow-up in 3 months.  Constipation Bowel function has improved, with bowel movements every two to three days and no longer requiring regular polyethylene glycol. - Advised to resume constipation management if no bowel movement for three days.  Plan - I reviewed Ryan Wilkerson restaging PET scan, which showed stable disease.  Discussed with patient in detail.  - He is clinically doing well, will continue lanreotide injection every 4 weeks  - Follow-up in 3 months   SUMMARY OF ONCOLOGIC HISTORY: Oncology History Overview Note  Cancer Staging No matching staging information was found for the patient.    Malignant neoplasm of prostate (HCC)  08/31/2017 Initial Diagnosis   Malignant neoplasm of prostate (HCC)   10/28/2017 Genetic Testing   The patient had genetic testing due to a personal history of prostate cancer and a family history of breast and stomach cancer.  The Common Hereditary Cancers Panel + Prostate Cancer Panel was ordered.  APC, ATM, AXIN2, BARD1, BMPR1A, BRCA1, BRCA2, BRIP1, CDH1, CDK4, CDKN2A (p14ARF), CDKN2A (p16INK4a), CHEK2, CTNNA1, DICER1, EPCAM*, FANCA, GREM1*, KIT, MEN1, MLH1, MSH2, MSH3, MSH6, MUTYH, NBN, NF1, PALB2, PDGFRA, PMS2, POLD1, POLE, PTEN, RAD50, RAD51C, RAD51D, SDHB, SDHC, SDHD, SMAD4, SMARCA4, STK11, TP53, TSC1, TSC2, VHL. The following genes were evaluated for sequence changes only: HOXB13*, NTHL1*, SDHA.   Results: Negative, no pathogenic variants identified. The date of this test report is 10/28/2017.    01/05/2018 - 02/12/2018 Radiation Therapy   S/p radiation to the prostate 70 Gy in 28 fractions from 01/05/2018-02/12/2018  per Dr. Patrcia   01/31/2021  Imaging   CT A/P  IMPRESSION: 1. Interval decrease in size of hypodense lesions of the inferior right lobe of the liver and posterior liver dome findings are consistent with treatment response of metastatic lesions. 2. No evidence of new metastatic disease in the abdomen or pelvis. 3. Prostatomegaly.   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  Upper normal 9 mm short axis lymph node in the adjacent mesentery 2.  1.6 x 1.4 cm heterogeneous, poorly defined lesion in the inferior right liver suspicious for metastatic disease, the differential includes GI primary or metastatic prostate cancer No retroperitoneal or pelvic sidewall lymphadenopathy   04/01/2020 PET scan   IMPRESSION: 1. Central mesenteric mass with intense radiotracer activity consistent well differentiated neuroendocrine tumor 2. 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 3. Small peritoneal/nodal metastasis along the LEFT iliac vessels. 4. Very small lesion in the pancreas with intermediate activity could represent primary lesion. No primary bowel lesion identified. 5. Small metastatic implant within the pericardium adjacent LEFT heart ventricle. 6. No skeletal metastasis.   04/30/2020 Pathology Results   FINAL MICROSCOPIC DIAGNOSIS:  A. 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  Tumor cells are negative  for TTF-1.  The findings are consistent with neuroendocrine tumor of  gastrointestinal origin.  Ki-67 stain shows a proliferative  index of  about 1%, consistent with well-differentiated, grade 1 tumor.    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 Held after cycle 1 due to suspected pericarditis and deconditioning.           --Changed to monthly Lanreotide on 10/18/20.     01/31/2021 Imaging   CT A/P  IMPRESSION: 1. Interval decrease in size of hypodense lesions of the inferior right lobe of the liver and posterior liver dome findings are consistent with treatment response of metastatic lesions. 2. No evidence of new metastatic disease in the abdomen or pelvis. 3. Prostatomegaly.   07/29/2021 Imaging   EXAM: CT CHEST, ABDOMEN, AND PELVIS WITH CONTRAST  IMPRESSION: 1. Slight interval increase in size of the 2 liver metastases. No new liver lesions evident. 2. Stable mild lymphadenopathy in the central small bowel mesentery. 3. 3 mm right upper lobe pulmonary nodule. Attention on follow-up recommended. 4. 4 cm ascending thoracic aortic aneurysm, increased from 3.5 cm on 05/12/2020. Recommend annual imaging followup by CTA or MRA. This recommendation follows 2010 ACCF/AHA/AATS/ACR/ASA/SCA/SCAI/SIR/STS/SVM Guidelines for the Diagnosis and Management of Patients with Thoracic Aortic Disease. Circulation. 2010; 121: Z733-z630. Aortic aneurysm NOS (ICD10-I71.9)  5. Aortic Atherosclerosis (ICD10-I70.0).      Discussed the use of AI scribe software for clinical note transcription with the patient, who gave verbal consent to proceed.  History of Present Illness Ryan Wilkerson is a 79 year old male with metastatic well-differentiated (G1) neuroendocrine tumor of the small bowel with liver and nodal metastases who presents for routine oncology follow-up and management of weight loss.  He has been on lanreotide 120 mg every 4 weeks for several years for liver and nodal metastases.  Since Ryan Wilkerson last  visit he has lost about 5 pounds, which he links to decreased oral intake due to acid reflux, poor sleep, diminished appetite, early satiety, and reduced food intake. He spends most of Ryan Wilkerson time indoors and is less active. He denies new or worsening pain and reports only occasional spasms. Aside from reflux and early satiety he has no new or worsening gastrointestinal symptoms.  Bowel function has improved. He no longer uses Miralax  twice daily and now has bowel movements every 2 to 3 days, improved from nearly once weekly. He denies current bowel movement issues.  Jul 14, 2024: Follow-up for metastatic neuroendocrine tumor involving liver and abdominal lymph nodes; patient clinically stable on Lanreotide 120 mg every four weeks, with stable blood counts and organ function. July 2025 CT showed stable disease with two liver lesions, largest 1.7 cm; chronic constipation managed with Miralax  and prune juice, regimen adjustments discussed. DOTA PET scan planned in three months to reassess tumor status.     All other systems were reviewed with the patient and are negative.  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 [facility])    Nocturia    Pericardial effusion    Pericarditis    Prostate cancer [facility]) urologist-- dr alvaro /  oncologist-- dr manning   prostate bx 06-24-2016 and 09-15-2017 at dr alvaro office  Stage T1c, Gleason 4+3, PSA 8.48, vol 33cc---  planned external beam radiation therpay   Tobacco abuse    Urgency of urination    Wears glasses     SURGICAL  HISTORY: Past Surgical History:  Procedure Laterality Date   COLONOSCOPY  last one 06/ 2018   GOLD SEED IMPLANT N/A 12/23/2017   Procedure: GOLD SEED IMPLANT;  Surgeon: Alvaro Hummer, MD;  Location: [facility];  Service: Urology;  Laterality: N/A;   LEFT HEART CATH AND CORONARY ANGIOGRAPHY N/A 05/12/2020   Procedure: LEFT HEART CATH AND CORONARY ANGIOGRAPHY;  Surgeon: Verlin Lonni BIRCH, MD;  Location: [facility] INVASIVE CV LAB;  Service: Cardiovascular;  Laterality: N/A;   PROSTATE BIOPSY  06-24-2016;  09-15-2017-- at dr alvaro office   SPACE OAR INSTILLATION N/A 12/23/2017   Procedure: SPACE OAR INSTILLATION;  Surgeon: Alvaro Hummer, MD;  Location: [facility];  Service: Urology;  Laterality: N/A;    I have reviewed the social history and family history with the patient and they are unchanged from previous note.  ALLERGIES:  is allergic to aspirin , primidone, and simvastatin.  MEDICATIONS:  Current Outpatient Medications  Medication Sig Dispense Refill   acetaminophen  (TYLENOL ) 500 MG tablet Take 1,000 mg by mouth every 6 (six) hours as needed for headache (pain).      clopidogrel  (PLAVIX ) 75 MG tablet Take 75 mg by mouth daily.      dicyclomine  (BENTYL ) 10 MG capsule Take 1 capsule (10 mg total) by mouth every 8 (eight) hours as needed for spasms. 30 capsule 1   dicyclomine  (BENTYL ) 20 MG tablet TAKE 1 TABLET BY MOUTH FOUR TIMES DAILY AS NEEDED FOR ABDOMINAL PAIN  for 10     enzalutamide (XTANDI) 40 MG capsule 40mg  Orally Once a day; Duration: 30 days (takes 10mg  4 capsules daily)     enzalutamide (XTANDI) 40 MG tablet 4 tablets Orally Once a day     fluticasone  (FLONASE ) 50 MCG/ACT nasal spray Place 2 sprays into both nostrils daily. 16 g 0   lanreotide acetate  (SOMATULINE DEPOT ) 120 MG/0.5ML injection Inject into the skin.     loratadine  (CLARITIN ) 10 MG tablet Take 10 mg by mouth every morning.     meclizine (ANTIVERT) 25 MG tablet 1 tablet Orally four  times a day as needed for dizziness     mirtazapine  (REMERON  SOL-TAB) 15 MG disintegrating tablet Take 15 mg by mouth at bedtime.     ondansetron  (ZOFRAN ) 4 MG tablet Take 1 tablet (4 mg total) by mouth every 8 (eight) hours as needed for nausea or vomiting. 30 tablet 1   OVER THE COUNTER MEDICATION Place 1 drop into both eyes daily as needed (dry eyes). Over the counter lubricating eye drop      pantoprazole  (PROTONIX ) 40 MG tablet Take 1 tablet (40 mg total) by mouth daily. 30 tablet 0   polyethylene glycol (MIRALAX  / GLYCOLAX ) 17 g packet Take 17 g by mouth 2 (two) times daily. 14 each 0   pravastatin  (PRAVACHOL ) 40 MG tablet 1 tablet Orally Once a day     SYMBICORT 160-4.5 MCG/ACT inhaler Inhale 1-2 puffs into the lungs as needed.     tamsulosin  (FLOMAX ) 0.4 MG CAPS capsule Take 0.4 mg by mouth.     traMADol  (ULTRAM ) 50 MG tablet Take 1-2 tablets (50-100 mg total) by mouth every 6 (six) hours as needed for moderate pain or severe pain. 15 tablet 0   XTANDI 40 MG tablet Take 80 mg by mouth 2 (two) times daily.     No current facility-administered medications for this visit.    PHYSICAL EXAMINATION: ECOG PERFORMANCE STATUS: 1 - Symptomatic but completely ambulatory  Vitals:   10/06/24 1306 10/06/24 1308  BP: (!) 164/94 (!) 162/92  Pulse: 90   Resp:    Temp:    SpO2: 98%    Wt Readings from Last 3 Encounters:  10/06/24 150 lb 11.2 oz (68.4 kg)  07/14/24 156 lb 14.4 oz (71.2 kg)  04/21/24 155 lb 12.8 oz (70.7 kg)     GENERAL:alert, no distress and comfortable SKIN: skin color, texture, turgor are normal, no rashes or significant lesions EYES: normal, Conjunctiva are pink and non-injected, sclera clear NECK: supple, thyroid  normal size, non-tender, without nodularity LYMPH:  no palpable lymphadenopathy in the cervical, axillary  LUNGS: clear to auscultation and percussion with normal breathing effort HEART: regular rate & rhythm and no murmurs and no lower extremity  edema ABDOMEN:abdomen soft, non-tender and normal bowel sounds Musculoskeletal:no cyanosis of digits and no clubbing  NEURO: alert & oriented x 3 with fluent speech, no focal motor/sensory deficits  Physical Exam    LABORATORY DATA:  I have reviewed the data as listed    Latest Ref Rng & Units 10/06/2024   12:42 PM 09/09/2024    1:01 PM 08/11/2024    1:35 PM  CBC  WBC 4.0 - 10.5 K/uL 5.7  8.7  6.7   Hemoglobin 13.0 - 17.0 g/dL 88.2  88.5  87.7   Hematocrit 39.0 - 52.0 % 34.8  33.7  37.5   Platelets 150 - 400 K/uL 238  201  218  Latest Ref Rng & Units 10/06/2024   12:42 PM 09/09/2024    1:01 PM 08/11/2024    1:35 PM  CMP  Glucose 70 - 99 mg/dL 96  862  879   BUN 8 - 23 mg/dL 11  7  12    Creatinine 0.61 - 1.24 mg/dL 8.94  8.96  8.68   Sodium 135 - 145 mmol/L 139  138  138   Potassium 3.5 - 5.1 mmol/L 4.3  3.9  4.5   Chloride 98 - 111 mmol/L 102  104  102   CO2 22 - 32 mmol/L 27  25  27    Calcium 8.9 - 10.3 mg/dL 9.0  8.7  9.1   Total Protein 6.5 - 8.1 g/dL 7.0  6.5  6.9   Total Bilirubin 0.0 - 1.2 mg/dL 0.3  0.4  0.4   Alkaline Phos 38 - 126 U/L 78  66  67   AST 15 - 41 U/L 21  26  24    ALT 0 - 44 U/L 9  11  14        RADIOGRAPHIC STUDIES: I have personally reviewed the radiological images as listed and agreed with the findings in the report. No results found.    No orders of the defined types were placed in this encounter.  All questions were answered. The patient knows to call the clinic with any problems, questions or concerns. No barriers to learning was detected. The total time spent in the appointment was 25 minutes, including review of chart and various tests results, discussions about plan of care and coordination of care plan     Onita Mattock, MD 10/06/2024     "

## 2024-10-24 ENCOUNTER — Ambulatory Visit

## 2024-10-24 ENCOUNTER — Ambulatory Visit: Admitting: Radiation Oncology

## 2024-11-03 ENCOUNTER — Inpatient Hospital Stay: Attending: Nurse Practitioner

## 2024-11-03 ENCOUNTER — Inpatient Hospital Stay

## 2024-12-01 ENCOUNTER — Inpatient Hospital Stay

## 2024-12-01 ENCOUNTER — Inpatient Hospital Stay: Attending: Nurse Practitioner

## 2024-12-29 ENCOUNTER — Inpatient Hospital Stay

## 2024-12-29 ENCOUNTER — Inpatient Hospital Stay: Admitting: Hematology
